# Patient Record
Sex: Female | Born: 1940 | Race: Black or African American | Hispanic: No | Smoking: Never smoker
Health system: Southern US, Community
[De-identification: ages and names within clinical notes are randomized; demographics above are authoritative.]

## PROBLEM LIST (undated history)

## (undated) DIAGNOSIS — R7303 Prediabetes: Secondary | ICD-10-CM

## (undated) DIAGNOSIS — R011 Cardiac murmur, unspecified: Secondary | ICD-10-CM

## (undated) DIAGNOSIS — M4802 Spinal stenosis, cervical region: Secondary | ICD-10-CM

## (undated) DIAGNOSIS — Z9223 Personal history of estrogen therapy: Secondary | ICD-10-CM

## (undated) DIAGNOSIS — D649 Anemia, unspecified: Secondary | ICD-10-CM

## (undated) DIAGNOSIS — M109 Gout, unspecified: Secondary | ICD-10-CM

## (undated) DIAGNOSIS — R4702 Dysphasia: Secondary | ICD-10-CM

## (undated) DIAGNOSIS — M069 Rheumatoid arthritis, unspecified: Secondary | ICD-10-CM

## (undated) DIAGNOSIS — M1990 Unspecified osteoarthritis, unspecified site: Secondary | ICD-10-CM

## (undated) DIAGNOSIS — R519 Headache, unspecified: Secondary | ICD-10-CM

## (undated) DIAGNOSIS — K589 Irritable bowel syndrome without diarrhea: Secondary | ICD-10-CM

## (undated) DIAGNOSIS — E7800 Pure hypercholesterolemia, unspecified: Secondary | ICD-10-CM

## (undated) DIAGNOSIS — K219 Gastro-esophageal reflux disease without esophagitis: Secondary | ICD-10-CM

## (undated) DIAGNOSIS — I10 Essential (primary) hypertension: Secondary | ICD-10-CM

## (undated) DIAGNOSIS — T790XXA Air embolism (traumatic), initial encounter: Secondary | ICD-10-CM

## (undated) DIAGNOSIS — L219 Seborrheic dermatitis, unspecified: Secondary | ICD-10-CM

## (undated) DIAGNOSIS — I340 Nonrheumatic mitral (valve) insufficiency: Secondary | ICD-10-CM

## (undated) HISTORY — DX: Spinal stenosis, cervical region: M48.02

## (undated) HISTORY — PX: BACK SURGERY: SHX140

## (undated) HISTORY — PX: CATARACT EXTRACTION W/ INTRAOCULAR LENS  IMPLANT, BILATERAL: SHX1307

## (undated) HISTORY — PX: TOE SURGERY: SHX1073

## (undated) HISTORY — DX: Essential (primary) hypertension: I10

## (undated) HISTORY — PX: EYE SURGERY: SHX253

## (undated) HISTORY — PX: TUBAL LIGATION: SHX77

## (undated) HISTORY — DX: Seborrheic dermatitis, unspecified: L21.9

## (undated) HISTORY — DX: Nonrheumatic mitral (valve) insufficiency: I34.0

## (undated) HISTORY — DX: Pure hypercholesterolemia, unspecified: E78.00

## (undated) HISTORY — DX: Gastro-esophageal reflux disease without esophagitis: K21.9

## (undated) HISTORY — DX: Personal history of estrogen therapy: Z92.23

## (undated) HISTORY — DX: Irritable bowel syndrome, unspecified: K58.9

## (undated) HISTORY — PX: COLONOSCOPY: SHX174

## (undated) HISTORY — DX: Dysphasia: R47.02

---

## 1998-07-30 ENCOUNTER — Other Ambulatory Visit: Admission: RE | Admit: 1998-07-30 | Discharge: 1998-07-30 | Payer: Self-pay | Admitting: Podiatrist

## 1998-12-09 ENCOUNTER — Emergency Department (HOSPITAL_COMMUNITY): Admission: EM | Admit: 1998-12-09 | Discharge: 1998-12-09 | Payer: Self-pay | Admitting: Emergency Medicine

## 1998-12-09 ENCOUNTER — Encounter: Payer: Self-pay | Admitting: Emergency Medicine

## 1998-12-14 ENCOUNTER — Encounter: Payer: Self-pay | Admitting: Internal Medicine

## 1998-12-14 ENCOUNTER — Inpatient Hospital Stay (HOSPITAL_COMMUNITY): Admission: EM | Admit: 1998-12-14 | Discharge: 1998-12-19 | Payer: Self-pay | Admitting: Internal Medicine

## 1998-12-15 ENCOUNTER — Encounter: Payer: Self-pay | Admitting: Internal Medicine

## 1998-12-17 ENCOUNTER — Encounter: Payer: Self-pay | Admitting: Internal Medicine

## 1999-03-26 ENCOUNTER — Other Ambulatory Visit: Admission: RE | Admit: 1999-03-26 | Discharge: 1999-03-26 | Payer: Self-pay | Admitting: Internal Medicine

## 1999-12-03 ENCOUNTER — Encounter: Admission: RE | Admit: 1999-12-03 | Discharge: 1999-12-03 | Payer: Self-pay | Admitting: Internal Medicine

## 1999-12-03 ENCOUNTER — Encounter: Payer: Self-pay | Admitting: Internal Medicine

## 2000-04-10 ENCOUNTER — Other Ambulatory Visit: Admission: RE | Admit: 2000-04-10 | Discharge: 2000-04-10 | Payer: Self-pay | Admitting: Internal Medicine

## 2001-04-12 ENCOUNTER — Other Ambulatory Visit: Admission: RE | Admit: 2001-04-12 | Discharge: 2001-04-12 | Payer: Self-pay | Admitting: Internal Medicine

## 2001-06-06 ENCOUNTER — Emergency Department (HOSPITAL_COMMUNITY): Admission: EM | Admit: 2001-06-06 | Discharge: 2001-06-06 | Payer: Self-pay

## 2002-05-02 ENCOUNTER — Other Ambulatory Visit: Admission: RE | Admit: 2002-05-02 | Discharge: 2002-05-02 | Payer: Self-pay | Admitting: Internal Medicine

## 2002-12-20 ENCOUNTER — Ambulatory Visit (HOSPITAL_COMMUNITY): Admission: RE | Admit: 2002-12-20 | Discharge: 2002-12-20 | Payer: Self-pay | Admitting: Gastroenterology

## 2003-06-27 ENCOUNTER — Other Ambulatory Visit: Admission: RE | Admit: 2003-06-27 | Discharge: 2003-06-27 | Payer: Self-pay | Admitting: Internal Medicine

## 2004-07-12 ENCOUNTER — Other Ambulatory Visit: Admission: RE | Admit: 2004-07-12 | Discharge: 2004-07-12 | Payer: Self-pay | Admitting: Internal Medicine

## 2005-05-21 ENCOUNTER — Emergency Department (HOSPITAL_COMMUNITY): Admission: EM | Admit: 2005-05-21 | Discharge: 2005-05-21 | Payer: Self-pay | Admitting: Family Medicine

## 2005-07-22 ENCOUNTER — Other Ambulatory Visit: Admission: RE | Admit: 2005-07-22 | Discharge: 2005-07-22 | Payer: Self-pay | Admitting: Internal Medicine

## 2005-09-04 ENCOUNTER — Emergency Department (HOSPITAL_COMMUNITY): Admission: EM | Admit: 2005-09-04 | Discharge: 2005-09-04 | Payer: Self-pay | Admitting: Family Medicine

## 2006-01-12 ENCOUNTER — Emergency Department (HOSPITAL_COMMUNITY): Admission: EM | Admit: 2006-01-12 | Discharge: 2006-01-12 | Payer: Self-pay | Admitting: Emergency Medicine

## 2006-07-24 ENCOUNTER — Other Ambulatory Visit: Admission: RE | Admit: 2006-07-24 | Discharge: 2006-07-24 | Payer: Self-pay | Admitting: Internal Medicine

## 2007-05-06 ENCOUNTER — Emergency Department (HOSPITAL_COMMUNITY): Admission: EM | Admit: 2007-05-06 | Discharge: 2007-05-06 | Payer: Self-pay | Admitting: Emergency Medicine

## 2007-05-09 ENCOUNTER — Emergency Department (HOSPITAL_COMMUNITY): Admission: EM | Admit: 2007-05-09 | Discharge: 2007-05-09 | Payer: Self-pay | Admitting: Family Medicine

## 2007-07-27 ENCOUNTER — Other Ambulatory Visit: Admission: RE | Admit: 2007-07-27 | Discharge: 2007-07-27 | Payer: Self-pay | Admitting: Internal Medicine

## 2008-08-10 ENCOUNTER — Ambulatory Visit: Payer: Self-pay | Admitting: Internal Medicine

## 2008-12-28 ENCOUNTER — Emergency Department (HOSPITAL_COMMUNITY): Admission: EM | Admit: 2008-12-28 | Discharge: 2008-12-28 | Payer: Self-pay | Admitting: Family Medicine

## 2009-02-09 ENCOUNTER — Ambulatory Visit: Payer: Self-pay | Admitting: Internal Medicine

## 2009-03-09 ENCOUNTER — Ambulatory Visit: Payer: Self-pay | Admitting: Internal Medicine

## 2009-11-05 ENCOUNTER — Ambulatory Visit: Payer: Self-pay | Admitting: Internal Medicine

## 2009-11-09 ENCOUNTER — Ambulatory Visit: Payer: Self-pay | Admitting: Internal Medicine

## 2010-02-08 ENCOUNTER — Encounter: Admission: RE | Admit: 2010-02-08 | Discharge: 2010-02-08 | Payer: Self-pay | Admitting: Sports Medicine

## 2010-03-01 ENCOUNTER — Encounter: Admission: RE | Admit: 2010-03-01 | Discharge: 2010-03-01 | Payer: Self-pay | Admitting: Sports Medicine

## 2010-05-06 ENCOUNTER — Ambulatory Visit: Payer: Self-pay | Admitting: Internal Medicine

## 2010-05-26 HISTORY — PX: LUMBAR FUSION: SHX111

## 2010-05-30 ENCOUNTER — Inpatient Hospital Stay (HOSPITAL_COMMUNITY)
Admission: RE | Admit: 2010-05-30 | Discharge: 2010-06-06 | Payer: Self-pay | Source: Home / Self Care | Attending: Neurological Surgery | Admitting: Neurological Surgery

## 2010-06-10 LAB — CBC
HCT: 22 % — ABNORMAL LOW (ref 36.0–46.0)
HCT: 30.2 % — ABNORMAL LOW (ref 36.0–46.0)
HCT: 29.5 % — ABNORMAL LOW (ref 36.0–46.0)
HCT: 34.8 % — ABNORMAL LOW (ref 36.0–46.0)
Hemoglobin: 7.2 g/dL — ABNORMAL LOW (ref 12.0–15.0)
Hemoglobin: 10.5 g/dL — ABNORMAL LOW (ref 12.0–15.0)
Hemoglobin: 10.1 g/dL — ABNORMAL LOW (ref 12.0–15.0)
Hemoglobin: 11.4 g/dL — ABNORMAL LOW (ref 12.0–15.0)
MCH: 31.3 pg (ref 26.0–34.0)
MCH: 32.1 pg (ref 26.0–34.0)
MCH: 31.7 pg (ref 26.0–34.0)
MCH: 30.5 pg (ref 26.0–34.0)
MCHC: 32.7 g/dL (ref 30.0–36.0)
MCHC: 34.8 g/dL (ref 30.0–36.0)
MCHC: 34.2 g/dL (ref 30.0–36.0)
MCHC: 32.8 g/dL (ref 30.0–36.0)
MCV: 95.7 fL (ref 78.0–100.0)
MCV: 92.4 fL (ref 78.0–100.0)
MCV: 92.5 fL (ref 78.0–100.0)
MCV: 93 fL (ref 78.0–100.0)
Platelets: 353 10*3/uL (ref 150–400)
Platelets: 376 10*3/uL (ref 150–400)
Platelets: 352 10*3/uL (ref 150–400)
Platelets: 354 10*3/uL (ref 150–400)
RBC: 2.3 MIL/uL — ABNORMAL LOW (ref 3.87–5.11)
RBC: 3.27 MIL/uL — ABNORMAL LOW (ref 3.87–5.11)
RBC: 3.19 MIL/uL — ABNORMAL LOW (ref 3.87–5.11)
RBC: 3.74 MIL/uL — ABNORMAL LOW (ref 3.87–5.11)
RDW: 13.7 % (ref 11.5–15.5)
RDW: 14 % (ref 11.5–15.5)
RDW: 14.4 % (ref 11.5–15.5)
RDW: 14 % (ref 11.5–15.5)
WBC: 10.2 10*3/uL (ref 4.0–10.5)
WBC: 9.7 10*3/uL (ref 4.0–10.5)
WBC: 9.2 10*3/uL (ref 4.0–10.5)
WBC: 8.4 10*3/uL (ref 4.0–10.5)

## 2010-06-10 LAB — BASIC METABOLIC PANEL
BUN: 26 mg/dL — ABNORMAL HIGH (ref 6–23)
BUN: 18 mg/dL (ref 6–23)
BUN: 14 mg/dL (ref 6–23)
CO2: 22 mEq/L (ref 19–32)
CO2: 24 mEq/L (ref 19–32)
CO2: 25 mEq/L (ref 19–32)
Calcium: 7.8 mg/dL — ABNORMAL LOW (ref 8.4–10.5)
Calcium: 8.3 mg/dL — ABNORMAL LOW (ref 8.4–10.5)
Calcium: 8.5 mg/dL (ref 8.4–10.5)
Chloride: 104 mEq/L (ref 96–112)
Chloride: 101 mEq/L (ref 96–112)
Chloride: 98 mEq/L (ref 96–112)
Creatinine, Ser: 1.81 mg/dL — ABNORMAL HIGH (ref 0.4–1.2)
Creatinine, Ser: 1.35 mg/dL — ABNORMAL HIGH (ref 0.4–1.2)
Creatinine, Ser: 1.13 mg/dL (ref 0.4–1.2)
GFR calc Af Amer: 33 mL/min — ABNORMAL LOW (ref >60)
GFR calc Af Amer: 47 mL/min — ABNORMAL LOW (ref >60)
GFR calc Af Amer: 57 mL/min — ABNORMAL LOW (ref >60)
GFR calc non Af Amer: 28 mL/min — ABNORMAL LOW (ref >60)
GFR calc non Af Amer: 39 mL/min — ABNORMAL LOW (ref >60)
GFR calc non Af Amer: 47 mL/min — ABNORMAL LOW (ref >60)
Glucose, Bld: 111 mg/dL — ABNORMAL HIGH (ref 70–99)
Glucose, Bld: 124 mg/dL — ABNORMAL HIGH (ref 70–99)
Glucose, Bld: 106 mg/dL — ABNORMAL HIGH (ref 70–99)
Potassium: 4.1 mEq/L (ref 3.5–5.1)
Potassium: 3.8 mEq/L (ref 3.5–5.1)
Potassium: 3.7 mEq/L (ref 3.5–5.1)
Sodium: 133 mEq/L — ABNORMAL LOW (ref 135–145)
Sodium: 134 mEq/L — ABNORMAL LOW (ref 135–145)
Sodium: 132 mEq/L — ABNORMAL LOW (ref 135–145)

## 2010-06-10 LAB — URINALYSIS, ROUTINE W REFLEX MICROSCOPIC
Bilirubin Urine: NEGATIVE
Hgb urine dipstick: NEGATIVE
Ketones, ur: NEGATIVE mg/dL
Nitrite: NEGATIVE
Protein, ur: NEGATIVE mg/dL
Specific Gravity, Urine: 1.014 (ref 1.005–1.030)
Urine Glucose, Fasting: NEGATIVE mg/dL
Urobilinogen, UA: 0.2 mg/dL (ref 0.0–1.0)
pH: 6 (ref 5.0–8.0)

## 2010-06-10 LAB — CROSSMATCH
ABO/RH(D): A POS
Antibody Screen: NEGATIVE
Unit division: 0
Unit division: 0

## 2010-06-10 LAB — CARDIAC PANEL(CRET KIN+CKTOT+MB+TROPI)
CK, MB: 0.9 ng/mL (ref 0.3–4.0)
Relative Index: 0.5 (ref 0.0–2.5)
Total CK: 193 U/L — ABNORMAL HIGH (ref 7–177)
Troponin I: 0.01 ng/mL (ref 0.00–0.06)

## 2010-06-14 ENCOUNTER — Inpatient Hospital Stay (HOSPITAL_COMMUNITY)
Admission: EM | Admit: 2010-06-14 | Discharge: 2010-06-19 | Payer: Self-pay | Source: Home / Self Care | Attending: Internal Medicine | Admitting: Internal Medicine

## 2010-06-17 LAB — CBC
HCT: 36.2 % (ref 36.0–46.0)
Hemoglobin: 12 g/dL (ref 12.0–15.0)
MCH: 30.1 pg (ref 26.0–34.0)
MCHC: 33.1 g/dL (ref 30.0–36.0)
MCV: 90.7 fL (ref 78.0–100.0)
Platelets: 281 10*3/uL (ref 150–400)
RBC: 3.99 MIL/uL (ref 3.87–5.11)
RDW: 13.1 % (ref 11.5–15.5)
WBC: 13.6 10*3/uL — ABNORMAL HIGH (ref 4.0–10.5)

## 2010-06-17 LAB — CK TOTAL AND CKMB (NOT AT ARMC)
CK, MB: 0.8 ng/mL (ref 0.3–4.0)
Relative Index: INVALID (ref 0.0–2.5)
Total CK: 26 U/L (ref 7–177)

## 2010-06-17 LAB — CULTURE, BLOOD (ROUTINE X 2)
Culture  Setup Time: 201201121035
Culture  Setup Time: 201201121356
Culture: NO GROWTH
Culture: NO GROWTH

## 2010-06-17 LAB — COMPREHENSIVE METABOLIC PANEL
ALT: 19 U/L (ref 0–35)
AST: 22 U/L (ref 0–37)
Albumin: 3.2 g/dL — ABNORMAL LOW (ref 3.5–5.2)
Alkaline Phosphatase: 118 U/L — ABNORMAL HIGH (ref 39–117)
BUN: 25 mg/dL — ABNORMAL HIGH (ref 6–23)
CO2: 19 mEq/L (ref 19–32)
Calcium: 9.4 mg/dL (ref 8.4–10.5)
Chloride: 96 mEq/L (ref 96–112)
Creatinine, Ser: 1.4 mg/dL — ABNORMAL HIGH (ref 0.4–1.2)
GFR calc Af Amer: 45 mL/min — ABNORMAL LOW (ref >60)
GFR calc non Af Amer: 37 mL/min — ABNORMAL LOW (ref >60)
Glucose, Bld: 92 mg/dL (ref 70–99)
Potassium: 3.9 mEq/L (ref 3.5–5.1)
Sodium: 132 mEq/L — ABNORMAL LOW (ref 135–145)
Total Bilirubin: 0.6 mg/dL (ref 0.3–1.2)
Total Protein: 7.6 g/dL (ref 6.0–8.3)

## 2010-06-17 LAB — DIFFERENTIAL
Basophils Absolute: 0 10*3/uL (ref 0.0–0.1)
Basophils Relative: 0 % (ref 0–1)
Eosinophils Absolute: 0.1 10*3/uL (ref 0.0–0.7)
Eosinophils Relative: 1 % (ref 0–5)
Lymphocytes Relative: 9 % — ABNORMAL LOW (ref 12–46)
Lymphs Abs: 1.3 10*3/uL (ref 0.7–4.0)
Monocytes Absolute: 1.2 10*3/uL — ABNORMAL HIGH (ref 0.1–1.0)
Monocytes Relative: 9 % (ref 3–12)
Neutro Abs: 11 10*3/uL — ABNORMAL HIGH (ref 1.7–7.7)
Neutrophils Relative %: 81 % — ABNORMAL HIGH (ref 43–77)

## 2010-06-17 LAB — POCT I-STAT, CHEM 8
BUN: 26 mg/dL — ABNORMAL HIGH (ref 6–23)
Calcium, Ion: 1.12 mmol/L (ref 1.12–1.32)
Chloride: 100 mEq/L (ref 96–112)
Creatinine, Ser: 1.3 mg/dL — ABNORMAL HIGH (ref 0.4–1.2)
Glucose, Bld: 92 mg/dL (ref 70–99)
HCT: 41 % (ref 36.0–46.0)
Hemoglobin: 13.9 g/dL (ref 12.0–15.0)
Potassium: 3.8 mEq/L (ref 3.5–5.1)
Sodium: 130 mEq/L — ABNORMAL LOW (ref 135–145)
TCO2: 22 mmol/L (ref 0–100)

## 2010-06-17 LAB — POCT CARDIAC MARKERS
CKMB, poc: <1 ng/mL — ABNORMAL LOW (ref 1.0–8.0)
Myoglobin, poc: 164 ng/mL (ref 12–200)
Troponin i, poc: <0.05 ng/mL (ref 0.00–0.09)

## 2010-06-17 LAB — APTT: aPTT: 20 seconds — ABNORMAL LOW (ref 24–37)

## 2010-06-17 LAB — D-DIMER, QUANTITATIVE: D-Dimer, Quant: 10.39 ug/mL-FEU — ABNORMAL HIGH (ref 0.00–0.48)

## 2010-06-17 LAB — PROTIME-INR
INR: 1.02 (ref 0.00–1.49)
Prothrombin Time: 13.6 seconds (ref 11.6–15.2)

## 2010-06-17 LAB — TROPONIN I: Troponin I: 0.01 ng/mL (ref 0.00–0.06)

## 2010-06-18 LAB — MRSA PCR SCREENING: MRSA by PCR: NEGATIVE

## 2010-06-18 LAB — CBC
HCT: 31.2 % — ABNORMAL LOW (ref 36.0–46.0)
HCT: 30 % — ABNORMAL LOW (ref 36.0–46.0)
HCT: 28.6 % — ABNORMAL LOW (ref 36.0–46.0)
Hemoglobin: 10.6 g/dL — ABNORMAL LOW (ref 12.0–15.0)
Hemoglobin: 10.1 g/dL — ABNORMAL LOW (ref 12.0–15.0)
Hemoglobin: 9.7 g/dL — ABNORMAL LOW (ref 12.0–15.0)
MCH: 30.8 pg (ref 26.0–34.0)
MCH: 30.5 pg (ref 26.0–34.0)
MCH: 30.8 pg (ref 26.0–34.0)
MCHC: 34 g/dL (ref 30.0–36.0)
MCHC: 33.7 g/dL (ref 30.0–36.0)
MCHC: 33.9 g/dL (ref 30.0–36.0)
MCV: 90.7 fL (ref 78.0–100.0)
MCV: 90.6 fL (ref 78.0–100.0)
MCV: 90.8 fL (ref 78.0–100.0)
Platelets: 325 10*3/uL (ref 150–400)
Platelets: 319 10*3/uL (ref 150–400)
Platelets: 302 10*3/uL (ref 150–400)
RBC: 3.44 MIL/uL — ABNORMAL LOW (ref 3.87–5.11)
RBC: 3.31 MIL/uL — ABNORMAL LOW (ref 3.87–5.11)
RBC: 3.15 MIL/uL — ABNORMAL LOW (ref 3.87–5.11)
RDW: 13.3 % (ref 11.5–15.5)
RDW: 13.2 % (ref 11.5–15.5)
RDW: 13.2 % (ref 11.5–15.5)
WBC: 13.8 10*3/uL — ABNORMAL HIGH (ref 4.0–10.5)
WBC: 10.6 10*3/uL — ABNORMAL HIGH (ref 4.0–10.5)
WBC: 8.9 10*3/uL (ref 4.0–10.5)

## 2010-06-18 LAB — BASIC METABOLIC PANEL
BUN: 22 mg/dL (ref 6–23)
BUN: 18 mg/dL (ref 6–23)
CO2: 11 mEq/L — ABNORMAL LOW (ref 19–32)
CO2: 21 mEq/L (ref 19–32)
Calcium: UNDETERMINED mg/dL (ref 8.4–10.5)
Calcium: 8.6 mg/dL (ref 8.4–10.5)
Chloride: 101 mEq/L (ref 96–112)
Chloride: 102 mEq/L (ref 96–112)
Creatinine, Ser: 1.04 mg/dL (ref 0.4–1.2)
Creatinine, Ser: 1.04 mg/dL (ref 0.4–1.2)
GFR calc Af Amer: >60 mL/min (ref >60)
GFR calc Af Amer: >60 mL/min (ref >60)
GFR calc non Af Amer: 52 mL/min — ABNORMAL LOW (ref >60)
GFR calc non Af Amer: 52 mL/min — ABNORMAL LOW (ref >60)
Glucose, Bld: 97 mg/dL (ref 70–99)
Glucose, Bld: 97 mg/dL (ref 70–99)
Potassium: UNDETERMINED mEq/L (ref 3.5–5.1)
Potassium: 3.2 mEq/L — ABNORMAL LOW (ref 3.5–5.1)
Sodium: 134 mEq/L — ABNORMAL LOW (ref 135–145)
Sodium: 132 mEq/L — ABNORMAL LOW (ref 135–145)

## 2010-06-18 LAB — CARDIAC PANEL(CRET KIN+CKTOT+MB+TROPI)
CK, MB: 1 ng/mL (ref 0.3–4.0)
CK, MB: 0.8 ng/mL (ref 0.3–4.0)
Relative Index: INVALID (ref 0.0–2.5)
Relative Index: INVALID (ref 0.0–2.5)
Total CK: 45 U/L (ref 7–177)
Total CK: 40 U/L (ref 7–177)
Troponin I: 0.03 ng/mL (ref 0.00–0.06)
Troponin I: 0.01 ng/mL (ref 0.00–0.06)

## 2010-06-18 LAB — GLUCOSE, CAPILLARY: Glucose-Capillary: 101 mg/dL — ABNORMAL HIGH (ref 70–99)

## 2010-06-18 LAB — PROTIME-INR
INR: 1.13 (ref 0.00–1.49)
INR: 1.11 (ref 0.00–1.49)
Prothrombin Time: 14.7 seconds (ref 11.6–15.2)
Prothrombin Time: 14.5 seconds (ref 11.6–15.2)

## 2010-06-18 LAB — IRON AND TIBC
Iron: 32 ug/dL — ABNORMAL LOW (ref 42–135)
Saturation Ratios: 16 % — ABNORMAL LOW (ref 20–55)
TIBC: 198 ug/dL — ABNORMAL LOW (ref 250–470)
UIBC: 166 ug/dL

## 2010-06-18 LAB — HEPARIN LEVEL (UNFRACTIONATED)
Heparin Unfractionated: 0.19 IU/mL — ABNORMAL LOW (ref 0.30–0.70)
Heparin Unfractionated: 0.23 IU/mL — ABNORMAL LOW (ref 0.30–0.70)
Heparin Unfractionated: 0.38 IU/mL (ref 0.30–0.70)

## 2010-06-18 LAB — FOLATE: Folate: 10.3 ng/mL

## 2010-06-18 LAB — BRAIN NATRIURETIC PEPTIDE: Pro B Natriuretic peptide (BNP): <30 pg/mL (ref 0.0–100.0)

## 2010-06-18 LAB — FERRITIN: Ferritin: 380 ng/mL — ABNORMAL HIGH (ref 10–291)

## 2010-06-18 LAB — VITAMIN B12: Vitamin B-12: 386 pg/mL (ref 211–911)

## 2010-06-19 LAB — CBC
HCT: 30.5 % — ABNORMAL LOW (ref 36.0–46.0)
HCT: 30.7 % — ABNORMAL LOW (ref 36.0–46.0)
Hemoglobin: 10.3 g/dL — ABNORMAL LOW (ref 12.0–15.0)
Hemoglobin: 10.3 g/dL — ABNORMAL LOW (ref 12.0–15.0)
MCH: 30.5 pg (ref 26.0–34.0)
MCH: 30.5 pg (ref 26.0–34.0)
MCHC: 33.8 g/dL (ref 30.0–36.0)
MCHC: 33.6 g/dL (ref 30.0–36.0)
MCV: 90.2 fL (ref 78.0–100.0)
MCV: 90.8 fL (ref 78.0–100.0)
Platelets: 291 10*3/uL (ref 150–400)
Platelets: 298 10*3/uL (ref 150–400)
RBC: 3.38 MIL/uL — ABNORMAL LOW (ref 3.87–5.11)
RBC: 3.38 MIL/uL — ABNORMAL LOW (ref 3.87–5.11)
RDW: 13.3 % (ref 11.5–15.5)
RDW: 13.4 % (ref 11.5–15.5)
WBC: 7.3 10*3/uL (ref 4.0–10.5)
WBC: 7.1 10*3/uL (ref 4.0–10.5)

## 2010-06-19 LAB — BASIC METABOLIC PANEL
BUN: 13 mg/dL (ref 6–23)
BUN: 12 mg/dL (ref 6–23)
CO2: 23 mEq/L (ref 19–32)
CO2: 24 mEq/L (ref 19–32)
Calcium: 8.8 mg/dL (ref 8.4–10.5)
Calcium: 8.9 mg/dL (ref 8.4–10.5)
Chloride: 105 mEq/L (ref 96–112)
Chloride: 104 mEq/L (ref 96–112)
Creatinine, Ser: 0.92 mg/dL (ref 0.4–1.2)
Creatinine, Ser: 0.89 mg/dL (ref 0.4–1.2)
GFR calc Af Amer: >60 mL/min (ref >60)
GFR calc Af Amer: >60 mL/min (ref >60)
GFR calc non Af Amer: >60 mL/min (ref >60)
GFR calc non Af Amer: >60 mL/min (ref >60)
Glucose, Bld: 93 mg/dL (ref 70–99)
Glucose, Bld: 93 mg/dL (ref 70–99)
Potassium: 3.7 mEq/L (ref 3.5–5.1)
Potassium: 3.7 mEq/L (ref 3.5–5.1)
Sodium: 135 mEq/L (ref 135–145)
Sodium: 135 mEq/L (ref 135–145)

## 2010-06-19 LAB — PROTIME-INR
INR: 1.29 (ref 0.00–1.49)
INR: 1.62 — ABNORMAL HIGH (ref 0.00–1.49)
Prothrombin Time: 16.3 seconds — ABNORMAL HIGH (ref 11.6–15.2)
Prothrombin Time: 19.4 seconds — ABNORMAL HIGH (ref 11.6–15.2)

## 2010-06-21 ENCOUNTER — Ambulatory Visit
Admission: RE | Admit: 2010-06-21 | Discharge: 2010-06-21 | Payer: Self-pay | Source: Home / Self Care | Attending: Internal Medicine | Admitting: Internal Medicine

## 2010-06-21 NOTE — Discharge Summary (Addendum)
  [name], [name]               ACCOUNT NO.:  [id number]  MEDICAL RECORD NO.:  [id number]          PATIENT TYPE:  INP  LOCATION:  3017                         FACILITY:  [facility]  PHYSICIAN:  Tia Alert, MD     DATE OF BIRTH:  1940/06/14  DATE OF ADMISSION:  05/30/2010 DATE OF DISCHARGE:  06/06/2010                              DISCHARGE SUMMARY   ADMITTING DIAGNOSIS:  Spinal stenosis with spondylolisthesis.  PROCEDURES:  Posterior lumbar interbody fusion L4-5, L5-S1.  SECONDARY DIAGNOSES: 1. Acute blood loss anemia. 2. Acute renal insufficiency.  BRIEF HISTORY OF PRESENT ILLNESS:  [name] is a very pleasant 70 year old female who presented with severe back bilateral leg pain.  She had an MRI and plain films which showed severe spondylosis with stenosis at L4-5, L5-S1 with spondylolisthesis.  I recommended two-level decompression instrumented fusion.  She understood the risks, benefits, expected outcome and wished to proceed.  HOSPITAL COURSE:  The patient was admitted on May 30, 2010, taken to the operating room where she underwent a two-level lumbar fusion.  The patient tolerated the procedure well and was taken to the recovery room and then to the floor in stable condition.  For details of the operative procedure, please see the dictated operative note.  During her hospital stay, her wound remained clean, dry, and intact.  She tolerated regular diet.  She had appropriate postoperative back soreness.  She had some intermittent right leg pain which was likely from nerve root retraction during the surgery or nerve root manipulation during the surgery.  She had no weakness.  She had good strength.  She had worked with Physical and Occupational Therapy and did well with that.  She did have acute blood loss anemia with hemoglobin of 7.2.  She received 2 units of packed red blood cells which increased her hemoglobin to 10.6.  At discharge, her hemoglobin was 11.4 and  was increasing.  She also had acute renal insufficiency and was hydrated.  Her creatinine got as high as 1.8; but at discharge, it was 1.137, so it improved quite a bit.  She was ambulating fairly well with physical and occupational therapy and using her walker.  Her wound was clean, dry, and intact at time of discharge.  She did have once fever spike up to 101.3.  Chest x-ray showed only low lung volumes.  She was given an incentive spirometer. She was afebrile at the time of discharge.  She had one episode of some hypotension and was found to be anemic and received blood and had no further episodes of hypotension.  She was discharged home in stable condition on June 06, 2010, with plans to follow up in 1 week.  FINAL DIAGNOSIS:  Posterior lumbar interbody fusion L4-5, L5-S1.     Tia Alert, MD     DSJ/MEDQ  D:  06/06/2010  T:  06/06/2010  Job:  [id number]  Electronically Signed by Marikay Alar MD on 06/21/2010 08:40:43 AM

## 2010-06-25 ENCOUNTER — Ambulatory Visit
Admission: RE | Admit: 2010-06-25 | Discharge: 2010-06-25 | Payer: Self-pay | Source: Home / Self Care | Attending: Internal Medicine | Admitting: Internal Medicine

## 2010-06-29 NOTE — Discharge Summary (Signed)
[name], [name]               ACCOUNT NO.:  [id number]  MEDICAL RECORD NO.:  [id number]          PATIENT TYPE:  INP  LOCATION:  3701                         FACILITY:  [facility]  PHYSICIAN:  Kathlen Mody, MD       DATE OF BIRTH:  1940-10-19  DATE OF ADMISSION:  06/14/2010 DATE OF DISCHARGE:  06/19/2010                              DISCHARGE SUMMARY   PRIMARY CARE PHYSICIAN:  Tanya Cole. Lenord Fellers, MD  DISCHARGE DIAGNOSES:  Bilateral pulmonary embolism.  OTHER DIAGNOSES:  Anemia, renal insufficiency, hypertension, cervical spondylosis status post recent posterior lumbar interbody fusion, glaucoma, hyperlipidemia, irritable bowel syndrome, osteoarthritis.  DISCHARGE MEDICATIONS: 1. Lovenox 90 mg subcu q.12 for 5 days. 2. Coumadin 7.5 mg daily for about 1 week and to check INR on Friday     and dose Coumadin as per the INR, Vicodin 1-2 tablets p.o. q.6 h.     for 7 days. 3. Lisinopril 5 mg p.o. daily. 4. Simvastatin 20 mg p.o. daily. 5. Metaxalone 800 mg p.o. 3 times a day. 6. Probiotic colon care counter 1 cap p.o. daily. 7. Librium 10 mg 1 tablet p.o. t.i.d. 8. Triamterene/hydrochlorothiazide 75/50 mg 1 tablet daily.  PERTINENT LABS:  She had a CBC done on January 20 which showed WBC count of 13.6, hemoglobin of 12, hematocrit of 36.2, platelets of 281,000. Point-of-care cardiac markers negative.  Sodium of 130, potassium of 3.8, chloride of 105, bicarb of 22, glucose of 92, creatinine of 1.3, BUN of 23.  D-dimers were elevated with 10.39.  Next set of troponin and CK-MB were also negative.  INR was 1.02, BNP was less than 30.  MRSA screen was negative.  Next set of cardiac enzymes were negative.  RADIOLOGY:  She had a chest x-ray done on January 20 which showed no significant abnormalities, no infiltrate.  CT angiogram was done on the same day which showed positive for bilateral pulmonary embolism, right heart strain cannot be excluded, small right trace left pleural effusion,  and bibasilar atelectasis.  BRIEF HOSPITAL COURSE:  This is a 70 year old lady with past medical history of recent posterior lumbar interbody fusion, hypertension, hyperlipidemia, osteoarthritis came in complaining of shortness of breath and chest discomfort.  She was worked up for acute coronary syndrome as well as PE. She was found to have elevated D-dimer and further underwent CT angiogram of the chest and found to have bilateral PE. She was concomitantly started on Lovenox and Coumadin with a recent INR of 1.62.  The patient at this time is being discharged home with 5 days of Lovenox and Coumadin of 7.5 mg.  She is recommended to get an INR checkup on Friday in Dr. Beryle Quant and dosed Coumadin as per INR.  DISCHARGE PHYSICAL EXAMINATION:  VITAL SIGNS:  She had a temperature of 98.4, pulse of 78, blood pressure of 111/72, respirations 18, oxygen saturation 98% on room air. GENERAL:  The patient is alert, afebrile, oriented x3, comfortable sitting in a chair. CARDIOVASCULAR:  S1, S2 heard.  No rubs, murmurs, or gallops.  Regular rate and rhythm. RESPIRATORY:  Good air entry bilateral.  No adventitious sounds. ABDOMEN:  Soft, nontender, nondistended.  Good bowel sounds. EXTREMITIES:  No pedal edema. NEUROLOGICAL:  Nonfocal.  At this time the patient is hemodynamically stable to be discharged home to follow up on Friday at Dr. Beryle Quant office to check her INR and dose Coumadin as per the INR.  Time spent with the patient 25 minutes.          ______________________________ Kathlen Mody, MD     VA/MEDQ  D:  06/19/2010  T:  06/20/2010  Job:  [id number]  Electronically Signed by Kathlen Mody MD on 06/29/2010 11:20:54 PM

## 2010-07-01 ENCOUNTER — Other Ambulatory Visit (INDEPENDENT_AMBULATORY_CARE_PROVIDER_SITE_OTHER): Payer: MEDICARE | Admitting: Internal Medicine

## 2010-07-01 DIAGNOSIS — M545 Low back pain, unspecified: Secondary | ICD-10-CM

## 2010-07-01 DIAGNOSIS — Z7901 Long term (current) use of anticoagulants: Secondary | ICD-10-CM

## 2010-07-05 ENCOUNTER — Inpatient Hospital Stay (HOSPITAL_COMMUNITY)
Admission: EM | Admit: 2010-07-05 | Discharge: 2010-07-08 | DRG: 866 | Disposition: A | Discharge status: Home / Self Care | Payer: MEDICARE | Attending: Internal Medicine | Admitting: Internal Medicine

## 2010-07-05 ENCOUNTER — Emergency Department (HOSPITAL_COMMUNITY): Payer: MEDICARE

## 2010-07-05 ENCOUNTER — Inpatient Hospital Stay (HOSPITAL_COMMUNITY): Payer: MEDICARE

## 2010-07-05 DIAGNOSIS — Z7901 Long term (current) use of anticoagulants: Secondary | ICD-10-CM

## 2010-07-05 DIAGNOSIS — R197 Diarrhea, unspecified: Secondary | ICD-10-CM | POA: Diagnosis present

## 2010-07-05 DIAGNOSIS — H409 Unspecified glaucoma: Secondary | ICD-10-CM | POA: Diagnosis present

## 2010-07-05 DIAGNOSIS — Z9889 Other specified postprocedural states: Secondary | ICD-10-CM

## 2010-07-05 DIAGNOSIS — I10 Essential (primary) hypertension: Secondary | ICD-10-CM | POA: Diagnosis present

## 2010-07-05 DIAGNOSIS — E860 Dehydration: Secondary | ICD-10-CM | POA: Diagnosis present

## 2010-07-05 DIAGNOSIS — B9789 Other viral agents as the cause of diseases classified elsewhere: Principal | ICD-10-CM | POA: Diagnosis present

## 2010-07-05 DIAGNOSIS — I2782 Chronic pulmonary embolism: Secondary | ICD-10-CM | POA: Diagnosis present

## 2010-07-05 DIAGNOSIS — K589 Irritable bowel syndrome without diarrhea: Secondary | ICD-10-CM | POA: Diagnosis present

## 2010-07-05 DIAGNOSIS — E785 Hyperlipidemia, unspecified: Secondary | ICD-10-CM | POA: Diagnosis present

## 2010-07-05 DIAGNOSIS — R112 Nausea with vomiting, unspecified: Secondary | ICD-10-CM | POA: Diagnosis present

## 2010-07-05 LAB — URINALYSIS, ROUTINE W REFLEX MICROSCOPIC
Bilirubin Urine: NEGATIVE
Bilirubin Urine: NEGATIVE
Hgb urine dipstick: NEGATIVE
Hgb urine dipstick: NEGATIVE
Ketones, ur: NEGATIVE mg/dL
Ketones, ur: NEGATIVE mg/dL
Nitrite: NEGATIVE
Nitrite: NEGATIVE
Protein, ur: NEGATIVE mg/dL
Protein, ur: NEGATIVE mg/dL
Specific Gravity, Urine: 1.006 (ref 1.005–1.030)
Specific Gravity, Urine: 1.007 (ref 1.005–1.030)
Urine Glucose, Fasting: NEGATIVE mg/dL
Urine Glucose, Fasting: NEGATIVE mg/dL
Urobilinogen, UA: 0.2 mg/dL (ref 0.0–1.0)
Urobilinogen, UA: 0.2 mg/dL (ref 0.0–1.0)
pH: 7 (ref 5.0–8.0)
pH: 6 (ref 5.0–8.0)

## 2010-07-05 LAB — COMPREHENSIVE METABOLIC PANEL
ALT: 16 U/L (ref 0–35)
AST: 21 U/L (ref 0–37)
Albumin: 3.9 g/dL (ref 3.5–5.2)
Alkaline Phosphatase: 110 U/L (ref 39–117)
BUN: 18 mg/dL (ref 6–23)
CO2: 25 mEq/L (ref 19–32)
Calcium: 9.7 mg/dL (ref 8.4–10.5)
Chloride: 99 mEq/L (ref 96–112)
Creatinine, Ser: 1.22 mg/dL — ABNORMAL HIGH (ref 0.4–1.2)
GFR calc Af Amer: 53 mL/min — ABNORMAL LOW (ref >60)
GFR calc non Af Amer: 43 mL/min — ABNORMAL LOW (ref >60)
Glucose, Bld: 106 mg/dL — ABNORMAL HIGH (ref 70–99)
Potassium: 4.1 mEq/L (ref 3.5–5.1)
Sodium: 136 mEq/L (ref 135–145)
Total Bilirubin: 0.6 mg/dL (ref 0.3–1.2)
Total Protein: 7.7 g/dL (ref 6.0–8.3)

## 2010-07-05 LAB — LACTIC ACID, PLASMA: Lactic Acid, Venous: 1 mmol/L (ref 0.5–2.2)

## 2010-07-05 LAB — DIFFERENTIAL
Basophils Absolute: 0 10*3/uL (ref 0.0–0.1)
Basophils Relative: 0 % (ref 0–1)
Eosinophils Absolute: 0.1 10*3/uL (ref 0.0–0.7)
Eosinophils Relative: 2 % (ref 0–5)
Lymphocytes Relative: 28 % (ref 12–46)
Lymphs Abs: 1.9 10*3/uL (ref 0.7–4.0)
Monocytes Absolute: 0.7 10*3/uL (ref 0.1–1.0)
Monocytes Relative: 10 % (ref 3–12)
Neutro Abs: 4.1 10*3/uL (ref 1.7–7.7)
Neutrophils Relative %: 61 % (ref 43–77)

## 2010-07-05 LAB — CBC
HCT: 36.2 % (ref 36.0–46.0)
Hemoglobin: 12.4 g/dL (ref 12.0–15.0)
MCH: 31.2 pg (ref 26.0–34.0)
MCHC: 34.3 g/dL (ref 30.0–36.0)
MCV: 91 fL (ref 78.0–100.0)
Platelets: 362 10*3/uL (ref 150–400)
RBC: 3.98 MIL/uL (ref 3.87–5.11)
RDW: 13.7 % (ref 11.5–15.5)
WBC: 6.8 10*3/uL (ref 4.0–10.5)

## 2010-07-05 LAB — PROTIME-INR
INR: 2.16 — ABNORMAL HIGH (ref 0.00–1.49)
Prothrombin Time: 24.2 seconds — ABNORMAL HIGH (ref 11.6–15.2)

## 2010-07-05 LAB — LIPASE, BLOOD: Lipase: 33 U/L (ref 11–59)

## 2010-07-05 MED ORDER — IOHEXOL 300 MG/ML  SOLN: 100.0000 mL | Freq: Once | INTRAMUSCULAR | Status: AC | PRN

## 2010-07-06 LAB — BASIC METABOLIC PANEL
BUN: 13 mg/dL (ref 6–23)
CO2: 25 mEq/L (ref 19–32)
Calcium: 9.1 mg/dL (ref 8.4–10.5)
Chloride: 105 mEq/L (ref 96–112)
Creatinine, Ser: 1.21 mg/dL — ABNORMAL HIGH (ref 0.4–1.2)
GFR calc Af Amer: 53 mL/min — ABNORMAL LOW (ref >60)
GFR calc non Af Amer: 44 mL/min — ABNORMAL LOW (ref >60)
Glucose, Bld: 94 mg/dL (ref 70–99)
Potassium: 4 mEq/L (ref 3.5–5.1)
Sodium: 138 mEq/L (ref 135–145)

## 2010-07-06 LAB — SODIUM, URINE, RANDOM: Sodium, Ur: 77 mEq/L

## 2010-07-06 LAB — CREATININE, URINE, RANDOM: Creatinine, Urine: 48.2 mg/dL

## 2010-07-06 LAB — CBC
HCT: 32.3 % — ABNORMAL LOW (ref 36.0–46.0)
Hemoglobin: 10.7 g/dL — ABNORMAL LOW (ref 12.0–15.0)
MCH: 30.9 pg (ref 26.0–34.0)
MCHC: 33.1 g/dL (ref 30.0–36.0)
MCV: 93.4 fL (ref 78.0–100.0)
Platelets: 298 10*3/uL (ref 150–400)
RBC: 3.46 MIL/uL — ABNORMAL LOW (ref 3.87–5.11)
RDW: 14.1 % (ref 11.5–15.5)
WBC: 5.7 10*3/uL (ref 4.0–10.5)

## 2010-07-06 LAB — PROTIME-INR
INR: 2.53 — ABNORMAL HIGH (ref 0.00–1.49)
Prothrombin Time: 27.4 seconds — ABNORMAL HIGH (ref 11.6–15.2)

## 2010-07-06 LAB — CLOSTRIDIUM DIFFICILE BY PCR: Toxigenic C. Difficile by PCR: NEGATIVE

## 2010-07-07 LAB — MAGNESIUM: Magnesium: 1.9 mg/dL (ref 1.5–2.5)

## 2010-07-07 LAB — BASIC METABOLIC PANEL
BUN: 9 mg/dL (ref 6–23)
CO2: 24 mEq/L (ref 19–32)
Calcium: 9.1 mg/dL (ref 8.4–10.5)
Chloride: 107 mEq/L (ref 96–112)
Creatinine, Ser: 1.13 mg/dL (ref 0.4–1.2)
GFR calc Af Amer: 57 mL/min — ABNORMAL LOW (ref >60)
GFR calc non Af Amer: 47 mL/min — ABNORMAL LOW (ref >60)
Glucose, Bld: 138 mg/dL — ABNORMAL HIGH (ref 70–99)
Potassium: 3.1 mEq/L — ABNORMAL LOW (ref 3.5–5.1)
Sodium: 138 mEq/L (ref 135–145)

## 2010-07-07 LAB — URINE CULTURE
Colony Count: 55000
Culture  Setup Time: 201202110210
Special Requests: NEGATIVE

## 2010-07-07 LAB — PROTIME-INR
INR: 3.09 — ABNORMAL HIGH (ref 0.00–1.49)
Prothrombin Time: 31.9 seconds — ABNORMAL HIGH (ref 11.6–15.2)

## 2010-07-08 ENCOUNTER — Ambulatory Visit: Payer: MEDICARE | Admitting: Internal Medicine

## 2010-07-08 LAB — PROTIME-INR
INR: 2.75 — ABNORMAL HIGH (ref 0.00–1.49)
Prothrombin Time: 29.2 seconds — ABNORMAL HIGH (ref 11.6–15.2)

## 2010-07-09 ENCOUNTER — Ambulatory Visit: Payer: MEDICARE | Admitting: Internal Medicine

## 2010-07-09 DIAGNOSIS — E860 Dehydration: Secondary | ICD-10-CM

## 2010-07-09 DIAGNOSIS — Z7901 Long term (current) use of anticoagulants: Secondary | ICD-10-CM

## 2010-07-09 LAB — STOOL CULTURE

## 2010-07-16 ENCOUNTER — Ambulatory Visit
Admission: RE | Admit: 2010-07-16 | Discharge: 2010-07-16 | Disposition: A | Discharge status: Home / Self Care | Payer: MEDICARE | Source: Ambulatory Visit | Attending: Neurological Surgery | Admitting: Neurological Surgery

## 2010-07-16 ENCOUNTER — Other Ambulatory Visit (INDEPENDENT_AMBULATORY_CARE_PROVIDER_SITE_OTHER): Payer: MEDICARE | Admitting: Internal Medicine

## 2010-07-16 ENCOUNTER — Other Ambulatory Visit: Payer: Self-pay | Admitting: Neurological Surgery

## 2010-07-16 DIAGNOSIS — Z7901 Long term (current) use of anticoagulants: Secondary | ICD-10-CM

## 2010-07-16 DIAGNOSIS — M549 Dorsalgia, unspecified: Secondary | ICD-10-CM

## 2010-07-18 NOTE — Discharge Summary (Signed)
Tanya, West               ACCOUNT NO.:  [id number]  MEDICAL RECORD NO.:  [id number]           PATIENT TYPE:  I  LOCATION:  1409                         [facility]ACILITY:  [facility]  PHYSICIAN:  [name], MDDATE OF BIRTH:  06-Apr-1941  DATE OF ADMISSION:  07/05/2010 DATE OF DISCHARGE:  07/09/2010                              DISCHARGE SUMMARY   DISCHARGE DISPOSITION:  Home.  PRIMARY CARE PHYSICIAN:  [name]. Tanya Fellers, MD  GASTROENTEROLOGIST:  Tanya Kuba, MD  FINAL DISCHARGE DIAGNOSES: 1. Nausea, vomiting, diarrhea, likely secondary to viral illness. 2. Dehydration secondary to nausea, vomiting, and diarrhea. 3. Bilateral pulmonary embolus diagnosed in January 2012. 4. Status post lumbosacral laminectomy. 5. Hypertension, controlled. 6. Hyperlipidemia. 7. History of irritable bowel syndrome. 8. Osteoarthritis. 9. Glaucoma. 10.Chronic anticoagulation.  DISCHARGE MEDICATIONS: 1. Tylenol 650 mg p.o. q.4h. p.r.n. pain. 2. Xanax 0.5 mg p.o. b.i.d. 3. Bactroban 2% cream applied topically b.i.d. to skin breakdown. 4. Librium 10 mg p.o. t.i.d. 5. Lisinopril 5 mg p.o. daily. 6. MiraLax 1 scoop by mouth daily as needed for constipation. 7. Simvastatin 20 mg p.o. daily. 8. Triamterene hydrochlorothiazide 75/50 mg p.o. daily. 9. Coumadin.  The patient is to take 6 mg p.o. for the next 2 days,     February 13 and February 14, and then have her INR checked by Dr.     Lenord West on February 15.  Coumadin titrated according to the INR. 10.Xalatan 0.005% ophthalmic drops, 1 drop to each eye daily at     bedtime.  CONSULTANTS:  None.  PROCEDURES:  None.  DIAGNOSTIC STUDIES: 1. Abdominal x-ray, which shows no acute cardiopulmonary process. 2. No evidence of bowel obstruction or intraperitoneal free air. 3. Posterior lumbar fusion.  CT of the abdomen and pelvis with contrast which shows no acute findings in the abdomen or pelvis.  No evidence of small bowel obstruction.  A small  hiatal hernia.  Postsurgical changes of lung effusion and probable atelectasis of the right lung base.  Pertinent laboratory studies, C diff by PCR negative.  CODE STATUS:  Full code.  ALLERGIES:  LATEX.  CHIEF COMPLAINT:  Abdominal pain, nausea, vomiting, and diarrhea.  HISTORY OF PRESENT ILLNESS:  Please refer to the H and P dictated by nurse practitioner, [name], for details of the HPI.  However in short, this is a 70 year old female who has had multiple hospitalizations, first for laminectomy and then for bilateral pulmonary emboli.  The patient has been having abdominal discomfort with nausea since her surgery in January.  The patient's symptoms progressed and on the day of admission, she was having emesis of bilious emesis or watery stool prompting an ER visit.  HOSPITAL COURSE: 1. Nausea, vomiting, diarrhea, and abdominal pain.  The patient likely     had multifactorial etiology of her symptoms.  I suspect that the     patient may have had a gastrointestinal virus in addition to     symptoms of irritable bowel syndrome.  The patient was given bowel     rest and supported with IV fluids due to her dehydration and then  clear liquids were introduced and were advanced as tolerated to a     low-residue diet.  The patient tolerated her diet without any     difficulty and has been able to eat without any nausea or vomiting.     The patient did have 1 episode of diarrhea after she had been on     solid foods; however, as of yesterday her bowel movements have been     solid.  The patient actually marked that she felt the best she had     felt since prior to her surgery.  The stool was sent for culture     and C diff PCR.  The C diff PCR was negative and the culture was     non-yielding. 2. Dehydration.  As noted above, the patient was dehydrated due to her     nausea, vomiting, and diarrhea, and she was given IV fluids     supportively.  She has also dehydration and  is not able to maintain     her hydration without any artificial means. 3. Hypertension.  The patient once restarted on her medication came     into the normotensive range. 4. Chronic Coumadin therapy.  The patient is on chronic warfarin.  She     had been started on Flagyl empirically for presumed C diff and the     interaction between the Coumadin and Flagyl caused her INR to     become supratherapeutic.  The Coumadin was held on yesterday and     today's INR is in therapeutic range.  My recommendation is that the patient should take 6 mg of warfarin for the next 48 hours instead of her usual 6.5.  At that time, she will have her INR checked at Dr. Beryle West office and Dr. Lenord West will further titrate her Coumadin from there determining whether she should resume her usual daily dosing of 6.5 mg.  In terms of her postsurgical state and her degenerative arthritis, the patient stated that she was without any pain and any pain was controlled with Tylenol.  She had been on Vicodin and at the patient's request, I have substituted Tylenol for Vicodin.  If the patient wants to take both of them, then she would exceed her daily maximum of Tylenol, thus she is only being prescribed Tylenol 650 mg p.o. q.6h. p.r.n. pain.  Otherwise, the patient has remained stable in the hospital.  She has been ambulating in the halls, using her cane without any difficulty.  At the time of discharge, the patient is well appearing and she has no complaints and no acute distress.  She is tolerating her diet well and she has got no further nausea, vomiting or diarrhea.  A temperature today is 98.7, respiratory rate is 18, heart rate is 77, blood pressure 138/77, and her oxygen saturations are 99% on room air.  Her INR at the time of discharge is 2.75.  HEENT examination, she is normocephalic, atraumatic.  Pupils are equally round and reactive to light and accommodation.  Extraocular movements are intact.  Her  oropharynx is moist.  There is no exudate, erythema, lesions noted.  Neck examination, trachea is midline.  No masses, no thyromegaly, no JVD, no carotid bruit.  Respiratory examination, she has a normal respiratory effort. She has got equal excursion bilaterally.  No wheezing or rhonchi noted. Cardiovascular, she has got a normal S1 and S2.  No murmurs, rubs, or gallops are noted.  PMI is nondisplaced.  No  heaves or thrills on palpation.  Abdomen is obese, soft, nontender, nondistended.  No masses, no hepatosplenomegaly is noted.  Extremities show no clubbing, cyanosis, or edema.  Neurological, she has got no focal neurological deficits. Cranial nerves II-XII are grossly intact.  Musculoskeletal, she has got no warmth, swelling, or erythema around the joints.  She has got no clubbing, cyanosis, or edema, and there is no spinal tenderness noted. Psychiatric, she is alert and oriented x3.  Good insight and cognition, good recent and remote recall.  DIETARY RESTRICTIONS:  The patient should be on vitamin K stable heart healthy diet.  PHYSICAL RESTRICTIONS:  The patient is ambulatory with a cane and is currently functional at baseline.  Total time for discharge process, 42 minutes.     Tanya Harm, MD  MAM/MEDQ  D:  07/08/2010  T:  07/08/2010  Job:  [id number]  cc:   [name]. [name], M.D. Fax: [phone]  [name], M.D. Fax: [phone]  Electronically Signed by Marthann Schiller MD on 07/18/2010 07:43:40 AM

## 2010-07-23 ENCOUNTER — Other Ambulatory Visit (INDEPENDENT_AMBULATORY_CARE_PROVIDER_SITE_OTHER): Payer: MEDICARE | Admitting: Internal Medicine

## 2010-07-23 DIAGNOSIS — Z7901 Long term (current) use of anticoagulants: Secondary | ICD-10-CM

## 2010-07-26 ENCOUNTER — Other Ambulatory Visit: Payer: MEDICARE | Admitting: Internal Medicine

## 2010-08-01 NOTE — H&P (Signed)
[name], [name]               ACCOUNT NO.:  [id number]  MEDICAL RECORD NO.:  [id number]          PATIENT TYPE:  EMS  LOCATION:  MAJO                         FACILITY:  [facility]  PHYSICIAN:  Harlin Heys, MD       DATE OF BIRTH:  June 28, 1940  DATE OF ADMISSION:  06/14/2010 DATE OF DISCHARGE:                             HISTORY & PHYSICAL   CHIEF COMPLAINT:  Shortness of breath.  HISTORY OF PRESENT ILLNESS:  This 70 year old white female presents with shortness of breath that started earlier today.  She had L4-5, S1 posterior lumbar interbody fusion by Dr. Marikay Alar from January 5. This was complicated by acute blood loss requiring 2 units packed red cell transfusion and renal insufficiency was noted.  Her EGFR is 36. She has been short of breath since yesterday but no associated fever, chills, cough or upper respiratory symptoms.  She was seen by her surgeon and was given a prescription for doxycycline due to drainage at the incision site but this has improved and none have been noticed since she has been in the emergency department.  Shortness of breath seemed insidious at first but then she finally decided to come into the emergency room because she started developing some uncomfortable chest and abdominal discomfort which she initially thought was gas in the xiphisternal upper abdominal area.  This then moved over to the right side of her chest and she now notices that a deep breath makes the chest pain worse.  This is pretty typical for pleuritic chest pain.  She has, however, had no cough, hemoptysis, leg swelling or pain and has had no prior history of deep vein thrombosis or pulmonary embolism.  There is no family history of deep vein thrombosis or pulmonary embolism.  She denies use of low molecular weight heparin at the time of her surgery for VT prophylaxis, although she does recall having used foot pumps.  PAST MEDICAL HISTORY:  Left fourth and fifth finger  fracture 2007, right shoulder osteoarthritis July 2001, degenerative disk disease, degenerative joint disease status post posterior lumbar interbody fusion May 30, 2010, hypertension for many years, glaucoma for many years, hyperlipidemia, irritable bowel syndrome.  CURRENT MEDICATIONS:  Lisinopril 5 mg daily; Zocor, dose unknown; diuretic pill, dose unknown, name unknown; Skelaxin 400-800 mg 3 times a day as needed, doxycycline 100 mg b.i.d. p.o. started by Dr. Yetta Barre; Librium 2 mg daily as needed for irritable bowel.  ALLERGIES:  Latex.  REVIEW OF SYSTEMS:  12 system review performed and negative except as above in history of present illness.  FAMILY HISTORY AND SOCIAL HISTORY:  She lives at home with eight of her nine children living nearby.  She has been a lifelong nonsmoker and teetotaller.  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  No relevant family history.  No family history of DVT or pulmonary embolism.  PHYSICAL EXAM:  GENERAL:  Pleasant white female in no acute distress. VITAL SIGNS:  Temperature 97.5, pulse 97, respiratory rate 22, blood pressure 97/45. During this admission, blood pressure varied from 74-105 systolic.  Pulse ox 100% room air. During my exam, her pulse  ox briefly dropped to 84%. HEAD:  Normocephalic, atraumatic. EYES:  Full extraocular movement. ENT: Normal oropharynx. NECK:  Supple. No goiter, no lymph nodes.  JVP appears hyperdynamic. Carotid impulse normal.  No bruits. LUNGS:  Clear to auscultation. CARDIOVASCULAR:  Regular rate and rhythm.  No murmur, rub or gallop. ABDOMEN:  Soft, nontender, no masses.  Normal bowel sounds.  No bruits. RECTAL/PELVIC:  Deferred. MUSCULOSKELETAL:  No clubbing, cyanosis, edema. No clinical evidence of deep vein thrombosis in lower extremities.  No palpable cords. CNS: Awake, alert, well-oriented.  No overt focal deficits.  LAB DATA:  D-dimer 10.39.  Normal range 0 to 0.48, sodium 132, potassium 3.9, chloride  96, CO2 19, BUN 25, creatinine 1.4, EGFR 37, glucose 92, alkaline phosphatase 118, AST 22, ALT 19, total protein 7.6, albumin 3.2, WBC 13.6, hemoglobin 12, platelets 281. No bandemia.  Myoglobin 164, troponin less than 0.05.  CK-MB less than 1.  EKG: Normal sinus rhythm with new incomplete right bundle branch block and nonspecific ST-T wave changes compared with EKG from June 04, 2010, QTC prolonged, increased from 429-477 milliseconds.  IMPRESSION:  A 70 year old African American female with shortness of breath, elevated D-dimer and new incomplete right bundle branch block and low blood pressure.  DISCUSSION AND PLAN:  These findings are consistent with pulmonary embolism.  Would normally start her on immediate anticoagulation; however, since she has had fairly recent surgery would like to await CT report which she has already had performed earlier today within the last half an hour.  If this confirms clinical suspicion of pulmonary embolism, we will start with IV heparin bolus followed by heparin drip. Dr. Yetta Barre, her neurosurgeon, will be alerted about her admission.  If the CT scan does not confirm pulmonary embolism, then we will still need to observe her overnight to rule out cardiac cause for her EKG change in symptoms.  We will continue her doxycycline as this has been recommended by her neurosurgeon.  The patient's daughter will bring in her diuretic and Zocor bottles so that we can administer that the correct drug and the correct dose.  The CT scan report is just available now on the electronic record and it is confirmatory for pulmonary emboli with clots seen in the distal aspect of both main pulmonary arteries extending to the lobar segmental and subsegmental branch of the right lower lobe and the lobar and segmental branches of the left lower lobe.  Therefore, we will immediately start intravenous heparin and she will be monitored closely during the night.           ______________________________ Harlin Heys, MD     BC/MEDQ  D:  06/14/2010  T:  06/14/2010  Job:  [id number]  cc:   Dr. Yetta Barre  Electronically Signed by Harlin Heys MD on 07/31/2010 12:45:53 PM

## 2010-08-02 ENCOUNTER — Other Ambulatory Visit (INDEPENDENT_AMBULATORY_CARE_PROVIDER_SITE_OTHER): Payer: MEDICARE | Admitting: Internal Medicine

## 2010-08-02 DIAGNOSIS — Z7901 Long term (current) use of anticoagulants: Secondary | ICD-10-CM

## 2010-08-05 LAB — CBC
HCT: 37.5 % (ref 36.0–46.0)
Hemoglobin: 12.1 g/dL (ref 12.0–15.0)
MCH: 31.3 pg (ref 26.0–34.0)
MCHC: 32.3 g/dL (ref 30.0–36.0)
MCV: 97.2 fL (ref 78.0–100.0)
Platelets: 478 10*3/uL — ABNORMAL HIGH (ref 150–400)
RBC: 3.86 MIL/uL — ABNORMAL LOW (ref 3.87–5.11)
RDW: 14.1 % (ref 11.5–15.5)
WBC: 7.9 10*3/uL (ref 4.0–10.5)

## 2010-08-05 LAB — SURGICAL PCR SCREEN
MRSA, PCR: NEGATIVE
Staphylococcus aureus: NEGATIVE

## 2010-08-05 LAB — BASIC METABOLIC PANEL
BUN: 27 mg/dL — ABNORMAL HIGH (ref 6–23)
CO2: 26 mEq/L (ref 19–32)
Calcium: 9.7 mg/dL (ref 8.4–10.5)
Chloride: 104 mEq/L (ref 96–112)
Creatinine, Ser: 1.29 mg/dL — ABNORMAL HIGH (ref 0.4–1.2)
GFR calc Af Amer: 49 mL/min — ABNORMAL LOW (ref >60)
GFR calc non Af Amer: 41 mL/min — ABNORMAL LOW (ref >60)
Glucose, Bld: 88 mg/dL (ref 70–99)
Potassium: 4.5 mEq/L (ref 3.5–5.1)
Sodium: 138 mEq/L (ref 135–145)

## 2010-08-05 LAB — DIFFERENTIAL
Basophils Absolute: 0.1 10*3/uL (ref 0.0–0.1)
Basophils Relative: 1 % (ref 0–1)
Eosinophils Absolute: 0.4 10*3/uL (ref 0.0–0.7)
Eosinophils Relative: 5 % (ref 0–5)
Lymphocytes Relative: 35 % (ref 12–46)
Lymphs Abs: 2.7 10*3/uL (ref 0.7–4.0)
Monocytes Absolute: 0.7 10*3/uL (ref 0.1–1.0)
Monocytes Relative: 9 % (ref 3–12)
Neutro Abs: 4 10*3/uL (ref 1.7–7.7)
Neutrophils Relative %: 51 % (ref 43–77)

## 2010-08-05 LAB — PROTIME-INR
INR: 0.95 (ref 0.00–1.49)
Prothrombin Time: 12.9 seconds (ref 11.6–15.2)

## 2010-08-05 LAB — TYPE AND SCREEN
ABO/RH(D): A POS
Antibody Screen: NEGATIVE

## 2010-08-05 LAB — ABO/RH: ABO/RH(D): A POS

## 2010-08-05 LAB — APTT: aPTT: 29 seconds (ref 24–37)

## 2010-08-16 ENCOUNTER — Other Ambulatory Visit (INDEPENDENT_AMBULATORY_CARE_PROVIDER_SITE_OTHER): Payer: MEDICARE | Admitting: Internal Medicine

## 2010-08-16 DIAGNOSIS — Z7901 Long term (current) use of anticoagulants: Secondary | ICD-10-CM

## 2010-08-29 ENCOUNTER — Other Ambulatory Visit (INDEPENDENT_AMBULATORY_CARE_PROVIDER_SITE_OTHER): Payer: MEDICARE | Admitting: Internal Medicine

## 2010-08-29 DIAGNOSIS — Z7901 Long term (current) use of anticoagulants: Secondary | ICD-10-CM

## 2010-09-23 ENCOUNTER — Ambulatory Visit
Admission: RE | Admit: 2010-09-23 | Discharge: 2010-09-23 | Disposition: A | Discharge status: Home / Self Care | Payer: MEDICARE | Source: Ambulatory Visit | Attending: Neurological Surgery | Admitting: Neurological Surgery

## 2010-09-23 ENCOUNTER — Other Ambulatory Visit: Payer: Self-pay | Admitting: Neurological Surgery

## 2010-09-23 DIAGNOSIS — M4310 Spondylolisthesis, site unspecified: Secondary | ICD-10-CM

## 2010-09-23 DIAGNOSIS — M79609 Pain in unspecified limb: Secondary | ICD-10-CM

## 2010-09-23 DIAGNOSIS — M48061 Spinal stenosis, lumbar region without neurogenic claudication: Secondary | ICD-10-CM

## 2010-09-23 DIAGNOSIS — M7130 Other bursal cyst, unspecified site: Secondary | ICD-10-CM

## 2010-09-27 ENCOUNTER — Ambulatory Visit (INDEPENDENT_AMBULATORY_CARE_PROVIDER_SITE_OTHER): Payer: MEDICARE | Admitting: Internal Medicine

## 2010-09-27 DIAGNOSIS — I10 Essential (primary) hypertension: Secondary | ICD-10-CM

## 2010-09-27 DIAGNOSIS — F411 Generalized anxiety disorder: Secondary | ICD-10-CM

## 2010-09-27 DIAGNOSIS — T81718A Complication of other artery following a procedure, not elsewhere classified, initial encounter: Secondary | ICD-10-CM

## 2010-09-27 DIAGNOSIS — I2699 Other pulmonary embolism without acute cor pulmonale: Secondary | ICD-10-CM

## 2010-10-11 NOTE — Op Note (Signed)
   [name], [name]                         ACCOUNT NO.:  [id number]   MEDICAL RECORD NO.:  [id number]                   PATIENT TYPE:  AMB   LOCATION:  ENDO                                 FACILITY:  [facility]   PHYSICIAN:  Petra Kuba, M.D.                 DATE OF BIRTH:  [birth year]   DATE OF PROCEDURE:  12/20/2002  DATE OF DISCHARGE:                                 OPERATIVE REPORT   PROCEDURE:  Colonoscopy.   INDICATIONS FOR PROCEDURE:  Screening. Consent was signed after risks,  benefits, methods, and options were thoroughly discussed in the office in  the past.   MEDICINES USED:  Demerol 60, Versed 7.5.   DESCRIPTION OF PROCEDURE:  Rectal inspection was pertinent for external  hemorrhoids, small. Digital exam was negative. The pediatric video  adjustable colonoscope was inserted, easily advanced around the colon to the  cecum. This did require some abdominal pressure but no position changes. No  obvious abnormality was seen on insertion. The cecum was identified by the  appendiceal orifice and the ileocecal valve. In fact, the scope was inserted  a very short ways into the terminal ileum which was normal. Photo  documentation was obtained. The scope was slowly withdrawn. The prep was  adequate. There was minimal liquid stool that required washing and  suctioning. On slow withdrawal through the colon, no polyps, diverticula,  masses or other abnormalities were seen as we slowly withdrew back to the  rectum. Anal rectal pull through and retroflexion confirmed some small  hemorrhoids. The scope was reinserted and advanced a short ways up the left  side of the colon, air was suctioned, scope removed. The patient tolerated  the procedure well. There was no obvious or immediate complication.   ENDOSCOPIC DIAGNOSIS:  1. Internal and external hemorrhoids.  2. Otherwise within normal limits to the cecum and the terminal ileum.   PLAN:  Happy to see back p.r.n.  Consider upping  her pump inhibitors to  twice a day or trying some of the others like Aciphex or Protonix and  otherwise return care to Dr. Lenord Fellers for the customary health care  maintenance to include yearly rectals and guaiacs.                                               Petra Kuba, M.D.    MEM/MEDQ  D:  12/20/2002  T:  12/20/2002  Job:  [id number]   cc:   Luanna Cole. Lenord Fellers, M.D.  [address], [place]  [place]  [place] [address]  Fax: [phone]

## 2010-10-28 ENCOUNTER — Other Ambulatory Visit: Payer: Medicare Other | Admitting: Internal Medicine

## 2010-10-28 ENCOUNTER — Telehealth: Payer: Self-pay

## 2010-10-28 DIAGNOSIS — Z7901 Long term (current) use of anticoagulants: Secondary | ICD-10-CM

## 2010-10-28 LAB — PROTIME-INR
INR: 2.57 — ABNORMAL HIGH (ref <1.50)
Prothrombin Time: 27.1 seconds — ABNORMAL HIGH (ref 11.6–15.2)

## 2010-10-28 NOTE — Telephone Encounter (Signed)
Patient informed of pt/inr results. Per Dr. Lenord Fellers, patient to continue same dose of coumadin and then discontinue med on November 27, 2010

## 2010-11-28 ENCOUNTER — Other Ambulatory Visit: Payer: Self-pay | Admitting: Internal Medicine

## 2010-12-02 ENCOUNTER — Other Ambulatory Visit: Payer: Self-pay | Admitting: Neurological Surgery

## 2010-12-02 ENCOUNTER — Ambulatory Visit
Admission: RE | Admit: 2010-12-02 | Discharge: 2010-12-02 | Disposition: A | Discharge status: Home / Self Care | Payer: Medicare Other | Source: Ambulatory Visit | Attending: Neurological Surgery | Admitting: Neurological Surgery

## 2010-12-02 DIAGNOSIS — M48061 Spinal stenosis, lumbar region without neurogenic claudication: Secondary | ICD-10-CM

## 2010-12-02 DIAGNOSIS — M79609 Pain in unspecified limb: Secondary | ICD-10-CM

## 2010-12-02 DIAGNOSIS — M4310 Spondylolisthesis, site unspecified: Secondary | ICD-10-CM

## 2010-12-10 ENCOUNTER — Telehealth: Payer: Self-pay | Admitting: Internal Medicine

## 2010-12-10 NOTE — Telephone Encounter (Signed)
Yes- have her take baby aspirin once daily.

## 2011-01-13 ENCOUNTER — Telehealth: Payer: Self-pay | Admitting: Internal Medicine

## 2011-01-13 ENCOUNTER — Encounter: Payer: Self-pay | Admitting: Internal Medicine

## 2011-01-13 ENCOUNTER — Ambulatory Visit (INDEPENDENT_AMBULATORY_CARE_PROVIDER_SITE_OTHER): Payer: Medicare Other | Admitting: Internal Medicine

## 2011-01-13 VITALS — BP 142/72 | HR 90 | Temp 99.3°F | Ht 61.0 in | Wt 177.0 lb

## 2011-01-13 DIAGNOSIS — Z86711 Personal history of pulmonary embolism: Secondary | ICD-10-CM

## 2011-01-13 DIAGNOSIS — J40 Bronchitis, not specified as acute or chronic: Secondary | ICD-10-CM

## 2011-01-13 DIAGNOSIS — E785 Hyperlipidemia, unspecified: Secondary | ICD-10-CM | POA: Insufficient documentation

## 2011-01-13 DIAGNOSIS — K219 Gastro-esophageal reflux disease without esophagitis: Secondary | ICD-10-CM

## 2011-01-13 DIAGNOSIS — E119 Type 2 diabetes mellitus without complications: Secondary | ICD-10-CM

## 2011-01-13 DIAGNOSIS — H409 Unspecified glaucoma: Secondary | ICD-10-CM | POA: Insufficient documentation

## 2011-01-13 DIAGNOSIS — K589 Irritable bowel syndrome without diarrhea: Secondary | ICD-10-CM

## 2011-01-13 DIAGNOSIS — I10 Essential (primary) hypertension: Secondary | ICD-10-CM

## 2011-01-13 NOTE — Telephone Encounter (Signed)
Prescription was signed generic. Pharmacist is free to substitute something else that is comparable.

## 2011-01-13 NOTE — Telephone Encounter (Signed)
Pharmacist says generic price is $41 at [facility] and the only thing that is cheaper is Robitussin DM OTC. Pt given 6x 5cc bottles of Zutripro from our office.

## 2011-01-13 NOTE — Progress Notes (Signed)
  Subjective:    Patient ID: [name], female    DOB: 01/09/1941, 70 y.o.   MRN: [id number]  HPI and patient say is she has head a cough for several weeks. More recently has developed an acute respiratory infection that started out as sore throat with some wheezing and a lot of coughing. Cannot be specific him exactly when all of this started. Says sputum was discolored. No fever or shaking chills. History of diabetes mellitus with hemoglobin A1c 6.4% December 2011. Doesn't watch Accu-Cheks on a regular basis and is diet controlled. History of hypertension, history of hyperlipidemia. Developed a pulmonary embolus after back surgery January 2012 and was on Coumadin until July 2012. Back surgery consisted of a 2 level lumbar fusion .    Review of Systems     Objective:   Physical Exam HEENT: TMs are slightly full, pharynx is clear. Neck is supple without significant adenopathy; chest is clear to auscultation; cardiac exam regular rate and rhythm normal S1 and S2        Assessment & Plan:  Bronchitis  Hypertension  Hyperlipidemia  History of diabetes mellitus  Status post 2 level lumbar fusion January 2012 with postoperative pulmonary embolism  Levaquin 500 mg daily for 10 days. Hycodan 8 ounces 1 teaspoon by mouth every 6 hours as needed for cough. In 2 weeks patient should have fasting lab work followed by office visit including lipid panel liver functions TSH and hemoglobin A1c. Last lab work to address these issues was December 2011.

## 2011-02-03 ENCOUNTER — Other Ambulatory Visit: Payer: Medicare Other | Admitting: Internal Medicine

## 2011-02-03 DIAGNOSIS — E119 Type 2 diabetes mellitus without complications: Secondary | ICD-10-CM

## 2011-02-03 DIAGNOSIS — E785 Hyperlipidemia, unspecified: Secondary | ICD-10-CM

## 2011-02-03 DIAGNOSIS — E039 Hypothyroidism, unspecified: Secondary | ICD-10-CM

## 2011-02-03 LAB — HEPATIC FUNCTION PANEL
ALT: 12 U/L (ref 0–35)
AST: 18 U/L (ref 0–37)
Albumin: 4 g/dL (ref 3.5–5.2)
Alkaline Phosphatase: 66 U/L (ref 39–117)
Bilirubin, Direct: 0.1 mg/dL (ref 0.0–0.3)
Indirect Bilirubin: 0.4 mg/dL (ref 0.0–0.9)
Total Bilirubin: 0.5 mg/dL (ref 0.3–1.2)
Total Protein: 7 g/dL (ref 6.0–8.3)

## 2011-02-03 LAB — LIPID PANEL
Cholesterol: 179 mg/dL (ref 0–200)
HDL: 47 mg/dL (ref >39)
LDL Cholesterol: 115 mg/dL — ABNORMAL HIGH (ref 0–99)
Total CHOL/HDL Ratio: 3.8 Ratio
Triglycerides: 86 mg/dL (ref <150)
VLDL: 17 mg/dL (ref 0–40)

## 2011-02-03 LAB — HEMOGLOBIN A1C
Hgb A1c MFr Bld: 6.3 % — ABNORMAL HIGH (ref <5.7)
Mean Plasma Glucose: 134 mg/dL — ABNORMAL HIGH (ref <117)

## 2011-02-03 LAB — TSH: TSH: 1.365 u[IU]/mL (ref 0.350–4.500)

## 2011-02-04 ENCOUNTER — Ambulatory Visit (INDEPENDENT_AMBULATORY_CARE_PROVIDER_SITE_OTHER): Payer: Medicare Other | Admitting: Internal Medicine

## 2011-02-04 ENCOUNTER — Encounter: Payer: Self-pay | Admitting: Internal Medicine

## 2011-02-04 VITALS — BP 121/70 | HR 64 | Temp 97.1°F | Ht 61.0 in | Wt 180.0 lb

## 2011-02-04 DIAGNOSIS — E785 Hyperlipidemia, unspecified: Secondary | ICD-10-CM

## 2011-02-04 DIAGNOSIS — F419 Anxiety disorder, unspecified: Secondary | ICD-10-CM

## 2011-02-04 DIAGNOSIS — Z23 Encounter for immunization: Secondary | ICD-10-CM

## 2011-02-04 DIAGNOSIS — H409 Unspecified glaucoma: Secondary | ICD-10-CM

## 2011-02-04 DIAGNOSIS — I10 Essential (primary) hypertension: Secondary | ICD-10-CM

## 2011-02-04 DIAGNOSIS — R300 Dysuria: Secondary | ICD-10-CM

## 2011-02-04 DIAGNOSIS — E119 Type 2 diabetes mellitus without complications: Secondary | ICD-10-CM

## 2011-02-04 DIAGNOSIS — F411 Generalized anxiety disorder: Secondary | ICD-10-CM

## 2011-02-04 LAB — POCT URINALYSIS DIPSTICK
Bilirubin, UA: NEGATIVE
Blood, UA: NEGATIVE
Glucose, UA: NEGATIVE
Ketones, UA: NEGATIVE
Nitrite, UA: NEGATIVE
Protein, UA: NEGATIVE
Spec Grav, UA: 1.01
Urobilinogen, UA: NEGATIVE
pH, UA: 7

## 2011-02-06 LAB — URINE CULTURE
Colony Count: NO GROWTH
Organism ID, Bacteria: NO GROWTH

## 2011-02-19 NOTE — Progress Notes (Signed)
  Subjective:    Patient ID: [name], female    DOB: 1940-08-05, 70 y.o.   MRN: [id number]  HPI 70 year old black female with history of hypertension, diabetes mellitus, hyperlipidemia, GE reflux, glaucoma,-year-old bowel syndrome and pulmonary embolus status post back surgery now off of Coumadin. Blood pressure well controlled on current regimen. Is on Zocor generic for hyperlipidemia. Diabetes is diet controlled. Also has some anxiety.    Review of Systems     Objective:   Physical Exam neck no thyromegaly or carotid bruits; chest clear; cardiac exam regular rate and rhythm normal S1 and S2; extremities without edema; diabetic foot exam pulses are normal, no foot ulcers        Assessment & Plan:  Hypertension  Hyperlipidemia  Diabetes mellitus-diet controlled  GE reflux  Curable bowel syndrome  Glaucoma  History of pulmonary embolus status post  back surgery  Fasting labs are drawn and are pending  Return in 6 months for physical exam.

## 2011-03-03 ENCOUNTER — Ambulatory Visit
Admission: RE | Admit: 2011-03-03 | Discharge: 2011-03-03 | Disposition: A | Discharge status: Home / Self Care | Payer: Medicare Other | Source: Ambulatory Visit | Attending: Neurological Surgery | Admitting: Neurological Surgery

## 2011-03-03 ENCOUNTER — Other Ambulatory Visit: Payer: Self-pay | Admitting: Neurological Surgery

## 2011-03-03 DIAGNOSIS — M79609 Pain in unspecified limb: Secondary | ICD-10-CM

## 2011-03-03 DIAGNOSIS — M7130 Other bursal cyst, unspecified site: Secondary | ICD-10-CM

## 2011-03-21 ENCOUNTER — Other Ambulatory Visit: Payer: Self-pay | Admitting: Internal Medicine

## 2011-04-24 IMAGING — CR DG LUMBAR SPINE 2-3V
3 series · 3 of 3 positions shown · non-contrast
Comparison: Abdomen films of 07/05/2010 and intraoperative C-arm
spot film of 05/30/2010

CLINICAL DATA: Lumbar spine fusion May 2010, some low back pain

LUMBAR SPINE - 2-3 VIEW

[t l-spine a.p.]
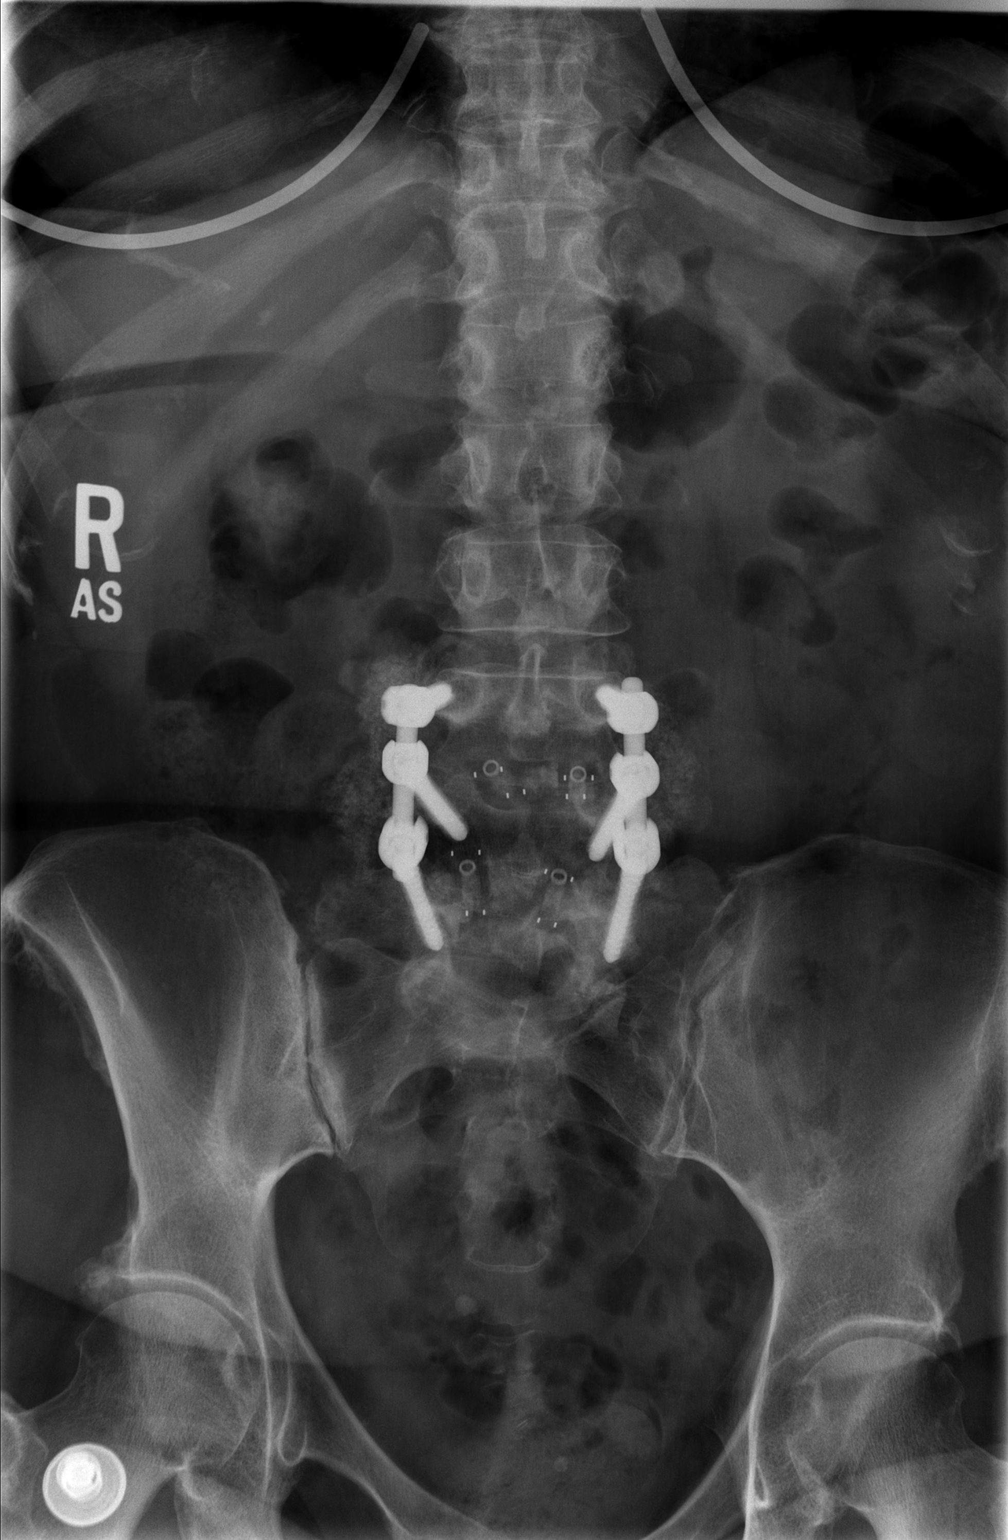

[t l-spine lat]
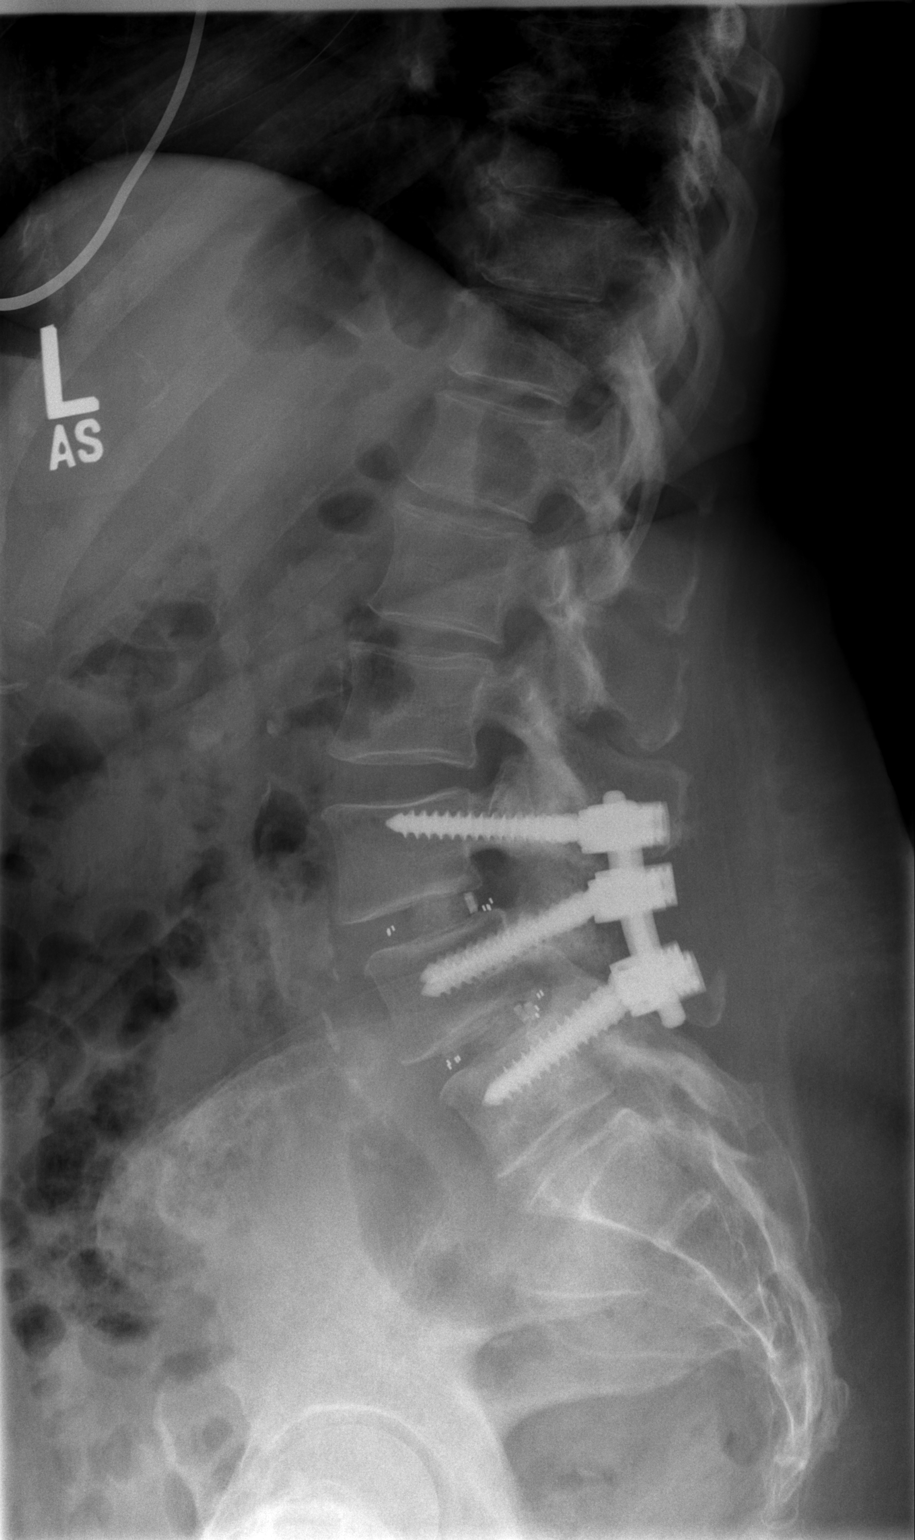

[t l-spine l5-s1 spot]
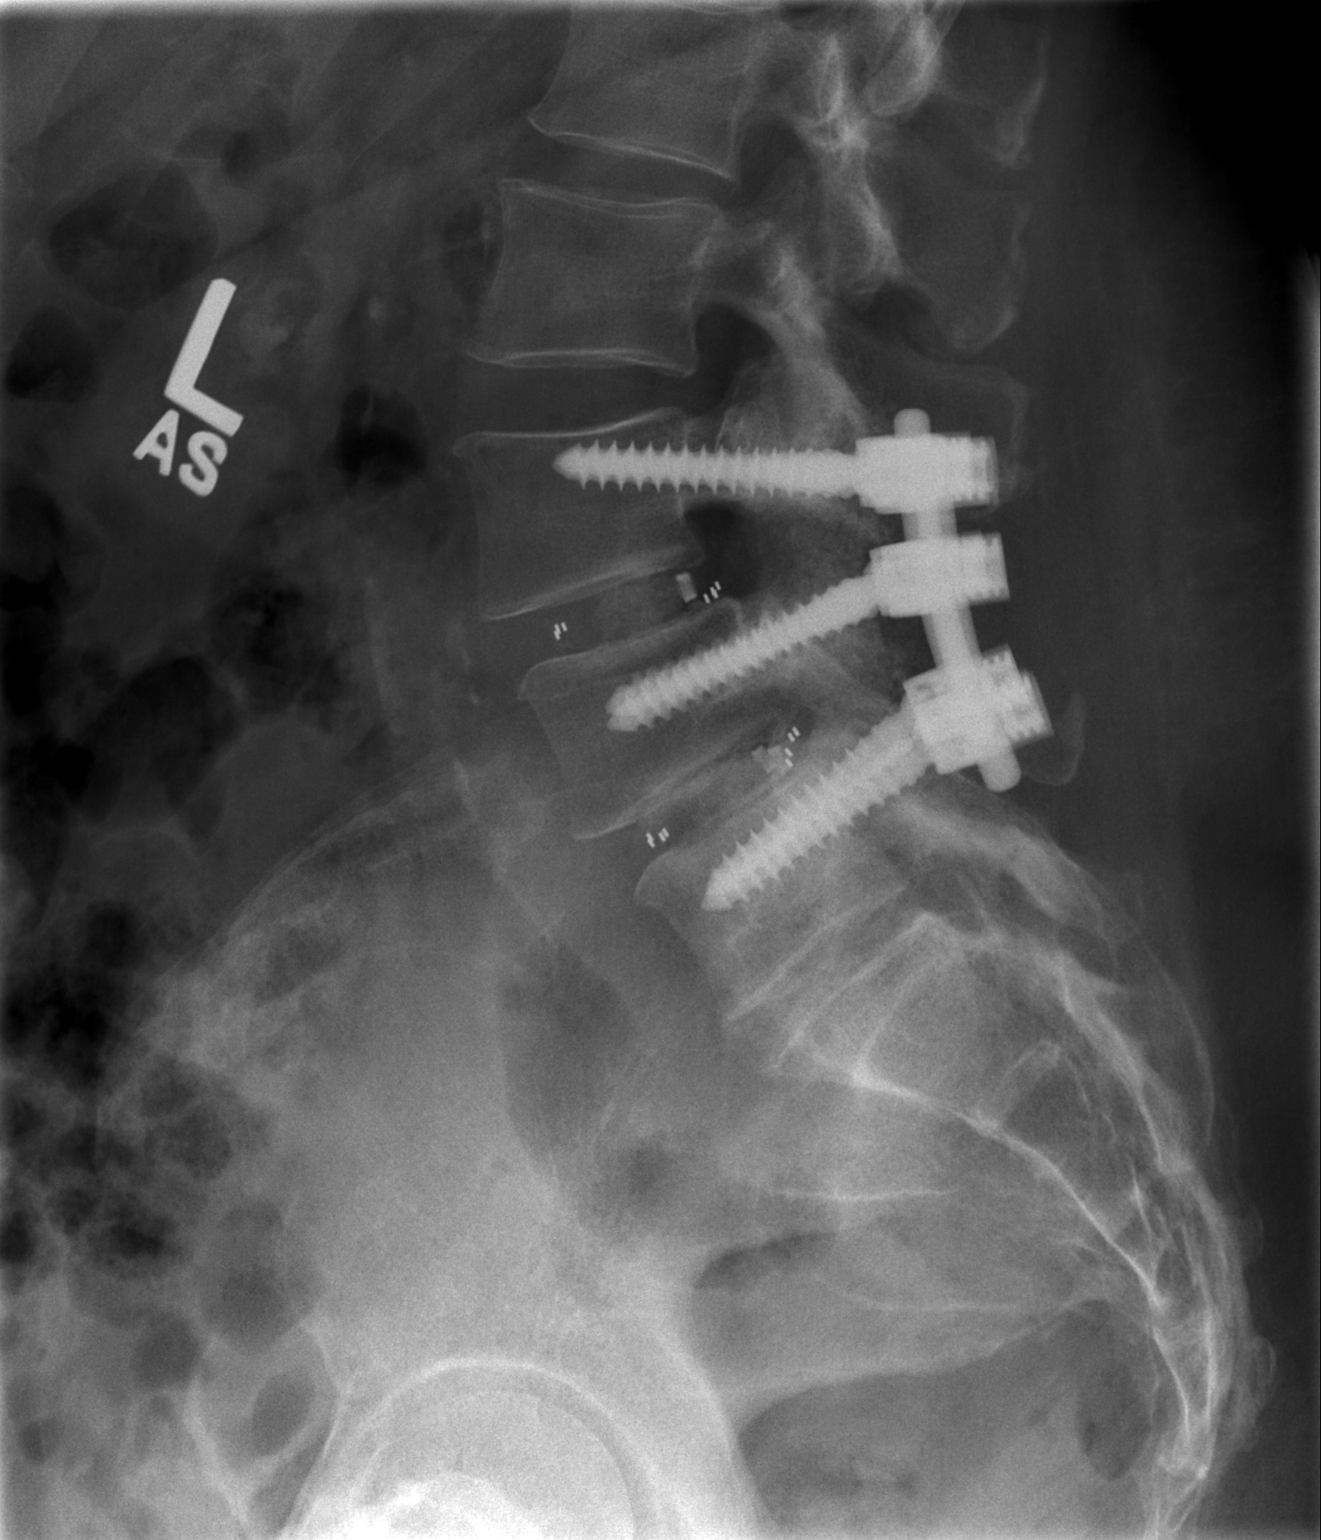

[3 of 3 positions shown; findings below may reference images not displayed]

FINDINGS: Hardware for posterior fusion from L3-L5 appears stable.
Interbody fusion plugs at L3-4 and L4-5 are unchanged in position,
and normal alignment is maintained.  No complicating features are
seen.
IMPRESSION: Stable posterior fusion from L3-L5.  Normal alignment.

## 2011-05-19 ENCOUNTER — Encounter: Payer: Self-pay | Admitting: Internal Medicine

## 2011-05-19 ENCOUNTER — Ambulatory Visit (INDEPENDENT_AMBULATORY_CARE_PROVIDER_SITE_OTHER): Payer: Medicare Other | Admitting: Internal Medicine

## 2011-05-19 VITALS — BP 110/60 | HR 80 | Temp 97.7°F | Wt 178.0 lb

## 2011-05-19 DIAGNOSIS — J069 Acute upper respiratory infection, unspecified: Secondary | ICD-10-CM

## 2011-05-19 DIAGNOSIS — J040 Acute laryngitis: Secondary | ICD-10-CM

## 2011-05-19 NOTE — Patient Instructions (Signed)
Take Augmentin 3 times daily with food for 10 days. Take cough syrup at bedtime. Call if not better in 10 days.

## 2011-05-19 NOTE — Progress Notes (Signed)
  Subjective:    Patient ID: [name], female    DOB: 11-Jul-1940, 70 y.o.   MRN: [id number]  HPI patient reports having URI symptoms for several weeks. Now has laryngitis. Has cough with discolored sputum production. No fever or shaking chills. Has had recent sore throat. Did have influenza vaccine.    Review of Systems     Objective:   Physical Exam HEENT exam: Pharynx is slightly injected without exudate. TMs are clear; neck is supple. Chest clear        Assessment & Plan:  Upper respiratory infection  Laryngitis  Plan: Augmentin 500 mg 3 times daily for 10 days. Samples of Rezira cough syrup 1 teaspoon by mouth at bedtime. Call if not better in 10 days.

## 2011-05-22 ENCOUNTER — Telehealth: Payer: Self-pay | Admitting: Internal Medicine

## 2011-05-22 MED ORDER — AZITHROMYCIN 250 MG PO TABS: ORAL_TABLET | ORAL | Status: DC

## 2011-05-22 NOTE — Telephone Encounter (Signed)
Call in Zithromax Z pak instead.

## 2011-05-26 ENCOUNTER — Telehealth: Payer: Self-pay | Admitting: Internal Medicine

## 2011-05-26 NOTE — Telephone Encounter (Signed)
Patient called this morning saying she had severe headache. Had not slept well last night. She called a few days ago saying that Augmentin and given her diarrhea and she was placed on a Zithromax Z-Pak. Complaining of head congestion. Recommended Sudafed only for 3 days with history of hypertension. She will get some over-the-counter. Called in Vicodin 5/500 (#40) 1 by mouth every 6 hours when necessary pain to [facility] Dr. Ginette Otto with no refill

## 2011-06-19 ENCOUNTER — Other Ambulatory Visit: Payer: Self-pay | Admitting: Internal Medicine

## 2011-06-19 MED ORDER — MUPIROCIN 2 % EX OINT: TOPICAL_OINTMENT | CUTANEOUS | Status: DC

## 2011-06-19 NOTE — Telephone Encounter (Signed)
I believe this is Bactroban topical ointment. Please refill for one year to use bid prn to [facility]

## 2011-06-23 ENCOUNTER — Telehealth: Payer: Self-pay | Admitting: Internal Medicine

## 2011-06-23 NOTE — Telephone Encounter (Signed)
Please refill these for one year each to [facility].

## 2011-06-24 ENCOUNTER — Other Ambulatory Visit: Payer: Self-pay

## 2011-06-24 MED ORDER — SIMVASTATIN 20 MG PO TABS: 20.0000 mg | ORAL_TABLET | Freq: Every day | ORAL | Status: DC

## 2011-06-24 MED ORDER — LISINOPRIL 5 MG PO TABS: 5.0000 mg | ORAL_TABLET | Freq: Every day | ORAL | Status: DC

## 2011-06-27 ENCOUNTER — Other Ambulatory Visit: Payer: Self-pay | Admitting: Internal Medicine

## 2011-07-01 ENCOUNTER — Other Ambulatory Visit: Payer: Self-pay | Admitting: Neurological Surgery

## 2011-07-01 ENCOUNTER — Ambulatory Visit
Admission: RE | Admit: 2011-07-01 | Discharge: 2011-07-01 | Disposition: A | Discharge status: Home / Self Care | Payer: Medicare Other | Source: Ambulatory Visit | Attending: Neurological Surgery | Admitting: Neurological Surgery

## 2011-07-01 DIAGNOSIS — M4310 Spondylolisthesis, site unspecified: Secondary | ICD-10-CM

## 2011-07-01 DIAGNOSIS — M79609 Pain in unspecified limb: Secondary | ICD-10-CM

## 2011-07-01 DIAGNOSIS — M48061 Spinal stenosis, lumbar region without neurogenic claudication: Secondary | ICD-10-CM

## 2011-07-14 ENCOUNTER — Telehealth: Payer: Self-pay | Admitting: Internal Medicine

## 2011-07-14 MED ORDER — CEPHALEXIN 500 MG PO CAPS: 500.0000 mg | ORAL_CAPSULE | Freq: Four times a day (QID) | ORAL | Status: DC

## 2011-07-14 NOTE — Telephone Encounter (Signed)
Rx called to [facility] for Keflex 500mg , as dir . Patient informed

## 2011-07-14 NOTE — Telephone Encounter (Signed)
Call in Keflex 500 mg qid x 7 days with no refill for this cyst.

## 2011-09-29 ENCOUNTER — Encounter: Payer: Self-pay | Admitting: Internal Medicine

## 2011-09-29 ENCOUNTER — Ambulatory Visit (INDEPENDENT_AMBULATORY_CARE_PROVIDER_SITE_OTHER): Payer: Medicare Other | Admitting: Internal Medicine

## 2011-09-29 VITALS — BP 114/66 | HR 68 | Temp 97.7°F | Wt 182.0 lb

## 2011-09-29 DIAGNOSIS — E785 Hyperlipidemia, unspecified: Secondary | ICD-10-CM

## 2011-09-29 DIAGNOSIS — M7542 Impingement syndrome of left shoulder: Secondary | ICD-10-CM

## 2011-09-29 DIAGNOSIS — M25519 Pain in unspecified shoulder: Secondary | ICD-10-CM

## 2011-09-29 DIAGNOSIS — I10 Essential (primary) hypertension: Secondary | ICD-10-CM

## 2011-09-29 DIAGNOSIS — M25819 Other specified joint disorders, unspecified shoulder: Secondary | ICD-10-CM

## 2011-09-29 DIAGNOSIS — M7541 Impingement syndrome of right shoulder: Secondary | ICD-10-CM

## 2011-09-29 MED ORDER — METHYLPREDNISOLONE ACETATE 80 MG/ML IJ SUSP
80.0000 mg | Freq: Once | INTRAMUSCULAR | Status: AC
  Administered 2011-09-29 (×2): 80 mg via INTRA_ARTICULAR

## 2011-09-29 MED ORDER — METHYLPREDNISOLONE ACETATE 80 MG/ML IJ SUSP: 80.0000 mg | Freq: Once | INTRAMUSCULAR | Status: DC

## 2011-09-29 NOTE — Patient Instructions (Signed)
You have been given bilateral shoulder injections today. Please take Vicodin sparingly for pain. Call if not better in 2 weeks and we will refer you to  specialist

## 2011-09-29 NOTE — Progress Notes (Signed)
  Subjective:    Patient ID: [name], female    DOB: [birth year], 71 y.o.   MRN: [id number]  HPI 71 year old white female with history of hypertension, anxiety, diabetes mellitus in today complaining of bilateral shoulder pain. Says right shoulder is much more painful than the left. Has problems sleeping because of shoulder pain. Patient says at times both arms feel weak. Has not dropped anything. Decreased range of motion both shoulders. Painful to elevate right shoulder up over her head fail but has ability to do so.    Review of Systems     Objective:   Physical Exam pain with extending right upper extremity up over her head. Says pain localized to right shoulder joint and deltoid area. Better range of motion with left upper extremity. Less pain.        Assessment & Plan:  Bilateral shoulder impingement  Plan: Patient declines x-ray at this point in time. She requests bilateral shoulder injections. Each shoulder injected with 1 cc Depo-Medrol, 1/2 cc lidocaine, 1 cc Marcaine. Vicodin 5/500 (#60) 1 by mouth Q8 hours when necessary pain. Call if not better in 2 weeks

## 2011-11-26 ENCOUNTER — Other Ambulatory Visit: Payer: Self-pay | Admitting: Internal Medicine

## 2011-12-23 ENCOUNTER — Other Ambulatory Visit: Payer: Self-pay | Admitting: Internal Medicine

## 2012-01-16 ENCOUNTER — Other Ambulatory Visit: Payer: Self-pay

## 2012-01-16 MED ORDER — POLYETHYLENE GLYCOL 3350 17 GM/SCOOP PO POWD: 17.0000 g | Freq: Every day | ORAL | Status: DC

## 2012-01-27 ENCOUNTER — Encounter: Payer: Self-pay | Admitting: Internal Medicine

## 2012-01-27 ENCOUNTER — Ambulatory Visit (INDEPENDENT_AMBULATORY_CARE_PROVIDER_SITE_OTHER): Payer: Medicare Other | Admitting: Internal Medicine

## 2012-01-27 ENCOUNTER — Other Ambulatory Visit: Payer: Self-pay | Admitting: Internal Medicine

## 2012-01-27 VITALS — BP 92/58 | HR 76 | Temp 98.5°F | Ht 61.0 in | Wt 180.0 lb

## 2012-01-27 DIAGNOSIS — M25531 Pain in right wrist: Secondary | ICD-10-CM

## 2012-01-27 DIAGNOSIS — M25539 Pain in unspecified wrist: Secondary | ICD-10-CM

## 2012-01-27 DIAGNOSIS — M25519 Pain in unspecified shoulder: Secondary | ICD-10-CM

## 2012-01-27 LAB — URIC ACID: Uric Acid, Serum: 7.6 mg/dL — ABNORMAL HIGH (ref 2.4–7.0)

## 2012-01-28 LAB — CYCLIC CITRUL PEPTIDE ANTIBODY, IGG: Cyclic Citrullin Peptide Ab: 41.5 U/mL — ABNORMAL HIGH (ref 0.0–5.0)

## 2012-01-28 LAB — ANTI-NUCLEAR AB-TITER (ANA TITER): ANA Titer 1: 1:40 {titer} — ABNORMAL HIGH

## 2012-01-28 LAB — ANA: Anti Nuclear Antibody(ANA): POSITIVE — AB

## 2012-01-28 LAB — SEDIMENTATION RATE: Sed Rate: 94 mm/hr — ABNORMAL HIGH (ref 0–22)

## 2012-01-28 NOTE — Progress Notes (Signed)
  Subjective:    Patient ID: [name], female    DOB: 06-13-1940, 71 y.o.   MRN: [id number]  HPI 71 year old black female who has had shoulder pain from time to time treated with intermittent injections of Depo-Medrol, Marcaine, and Xylocaine presents with bilateral shoulder aching over the past several days. No fever or chills. Patient also complaining of bilateral wrist pain. This is a new complaint. Patient says she's tried various treatments such as soaking her wrists in Epsom salt water which did not help. Able to continue to do activities of daily living but having significant aching in both shoulders and both wrists.       Review of Systems     Objective:   Physical Exam  She is able to raise arms up over her hand and has good range of motion. Tender in both glenohumeral joints. Dorsum of both wrists has increased warmth but no redness. Tender both wrists to palpation. Slight decreased range of motion with flexion and extension of both wrists.          Assessment & Plan:  Acute arthralgias both shoulders and both wrists  Plan: CCP, sed rate, ANA Vicodin 5/500 0ne tab po q 6 hours prn pain # 60  Addendum:  ANA positive speckled pattern with titer 1:40 ; Anti-Smith Antibody is negative.  CCP is elevated at 41.5 .  Refer to Rheumatologist.

## 2012-01-29 LAB — ANTI-SMITH ANTIBODY: ENA SM Ab Ser-aCnc: <1 AU/mL (ref <30)

## 2012-01-29 NOTE — Patient Instructions (Addendum)
Take Vicodin as needed for pain. Await lab results .

## 2012-02-04 ENCOUNTER — Telehealth: Payer: Self-pay

## 2012-02-06 NOTE — Telephone Encounter (Signed)
Patient informed of her appointment with Dr. Azzie Roup

## 2012-02-12 ENCOUNTER — Encounter: Payer: Self-pay | Admitting: Internal Medicine

## 2012-02-12 ENCOUNTER — Ambulatory Visit (INDEPENDENT_AMBULATORY_CARE_PROVIDER_SITE_OTHER): Payer: Medicare Other | Admitting: Internal Medicine

## 2012-02-12 ENCOUNTER — Other Ambulatory Visit: Payer: Self-pay | Admitting: Internal Medicine

## 2012-02-12 VITALS — BP 98/58 | HR 72 | Temp 98.0°F | Ht 61.0 in | Wt 179.0 lb

## 2012-02-12 DIAGNOSIS — F419 Anxiety disorder, unspecified: Secondary | ICD-10-CM

## 2012-02-12 DIAGNOSIS — F411 Generalized anxiety disorder: Secondary | ICD-10-CM

## 2012-02-12 DIAGNOSIS — R1013 Epigastric pain: Secondary | ICD-10-CM

## 2012-02-12 DIAGNOSIS — I10 Essential (primary) hypertension: Secondary | ICD-10-CM

## 2012-02-12 DIAGNOSIS — Z23 Encounter for immunization: Secondary | ICD-10-CM

## 2012-02-12 DIAGNOSIS — M069 Rheumatoid arthritis, unspecified: Secondary | ICD-10-CM

## 2012-02-12 DIAGNOSIS — K3189 Other diseases of stomach and duodenum: Secondary | ICD-10-CM

## 2012-02-13 LAB — H. PYLORI ANTIBODY, IGG: H Pylori IgG: <0.4 {ISR}

## 2012-02-16 ENCOUNTER — Encounter: Payer: Self-pay | Admitting: Internal Medicine

## 2012-02-16 NOTE — Patient Instructions (Addendum)
Continue treatment with rheumatologist. We are checking H. pylori antibody today because of complaint of epigastric pain. Return every 6 months.

## 2012-02-16 NOTE — Progress Notes (Signed)
  Subjective:    Patient ID: [name], female    DOB: [birth year], 71 y.o.   MRN: [id number]  HPI Has been diagnosed with rheumatoid arthritis by rheumatologist and has been placed on injectable methotrexate weekly. Rheumatologist injected her left shoulder which gave her some relief. Patient complaining of some vague epigastric pain. H. pylori antibody drawn today. She does have a history of irritable bowel syndrome and anxiety. I think she was very worried about her diagnosis of rheumatoid arthritis. Explained to her that this is likely to be well controlled with minimal medication at this point. She's pleased to hear that.  History of hypertension, hyperlipidemia, and anxiety.    Review of Systems     Objective:   Physical Exam improved range of motion left shoulder with decreased tenderness. Neck is supple without thyromegaly. Chest clear to auscultation. Cardiac exam regular rate and rhythm normal S1 and S2. Extremities without edema. No hot tender joints on joint exam. Abdomen no hepatosplenomegaly masses or significant tenderness        Assessment & Plan:  Rheumatoid arthritis-anxious about recent diagnosis. Improvement in left shoulder pain with injection by rheumatologist.   Hypertension-stable  Hyperlipidemia  Anxiety-reassure patient  Epigastric pain-etiology unclear but has history of variable bowel syndrome. H. pylori antibody drawn today. She has been taking Prilosec 20 mg over-the-counter. Advised increasing Prilosec to 20 mg twice daily for a month.  Addendum: H. pylori antibody is negative

## 2012-03-16 ENCOUNTER — Other Ambulatory Visit: Payer: Self-pay | Admitting: Internal Medicine

## 2012-03-17 ENCOUNTER — Other Ambulatory Visit: Payer: Self-pay | Admitting: Internal Medicine

## 2012-03-28 ENCOUNTER — Other Ambulatory Visit: Payer: Self-pay | Admitting: Internal Medicine

## 2012-05-03 ENCOUNTER — Other Ambulatory Visit: Payer: Self-pay | Admitting: Internal Medicine

## 2012-06-01 ENCOUNTER — Ambulatory Visit (INDEPENDENT_AMBULATORY_CARE_PROVIDER_SITE_OTHER): Payer: Medicare Other | Admitting: Internal Medicine

## 2012-06-01 ENCOUNTER — Encounter: Payer: Self-pay | Admitting: Internal Medicine

## 2012-06-01 VITALS — BP 132/74 | Temp 98.5°F | Wt 179.0 lb

## 2012-06-01 DIAGNOSIS — N898 Other specified noninflammatory disorders of vagina: Secondary | ICD-10-CM

## 2012-06-01 NOTE — Patient Instructions (Addendum)
Take sitz baths for 20 minutes twice a day until healed. Call if symptoms persist, worsen or do not improve

## 2012-06-01 NOTE — Progress Notes (Signed)
  Subjective:    Patient ID: [name], female    DOB: 1941-01-01, 72 y.o.   MRN: [id number]  HPI  A couple of weeks ago, noticed lesion in right vaginal wall near introitus that has been concerning to her. It does not hurt. No drainage.    Review of Systems     Objective:   Physical Exam 7 mm yellowish cystic lesion right vaginal wall near introitus. The first tried to aspirate the the cystic area but no fluid was returned. Then after anesthesia with 1% Xylocaine, used a scalpel to open it and drain it. No complications. Only had slight bleeding.        Assessment & Plan:  Vaginal cyst  Plan: Sitz baths for 20 minutes twice daily until healed.

## 2012-06-25 ENCOUNTER — Other Ambulatory Visit: Payer: Self-pay | Admitting: Internal Medicine

## 2012-06-27 ENCOUNTER — Other Ambulatory Visit: Payer: Self-pay | Admitting: Internal Medicine

## 2012-08-24 ENCOUNTER — Other Ambulatory Visit: Payer: Self-pay | Admitting: Internal Medicine

## 2012-09-13 ENCOUNTER — Other Ambulatory Visit: Payer: Self-pay | Admitting: Internal Medicine

## 2012-09-21 ENCOUNTER — Encounter: Payer: Self-pay | Admitting: Internal Medicine

## 2012-09-21 ENCOUNTER — Other Ambulatory Visit: Payer: Self-pay | Admitting: Internal Medicine

## 2012-09-21 ENCOUNTER — Ambulatory Visit (INDEPENDENT_AMBULATORY_CARE_PROVIDER_SITE_OTHER): Payer: Medicare Other | Admitting: Internal Medicine

## 2012-09-21 VITALS — BP 126/72 | HR 76 | Temp 97.8°F | Wt 172.0 lb

## 2012-09-21 DIAGNOSIS — E119 Type 2 diabetes mellitus without complications: Secondary | ICD-10-CM

## 2012-09-21 DIAGNOSIS — E785 Hyperlipidemia, unspecified: Secondary | ICD-10-CM

## 2012-09-21 DIAGNOSIS — M069 Rheumatoid arthritis, unspecified: Secondary | ICD-10-CM

## 2012-09-21 DIAGNOSIS — I10 Essential (primary) hypertension: Secondary | ICD-10-CM

## 2012-09-21 DIAGNOSIS — K589 Irritable bowel syndrome without diarrhea: Secondary | ICD-10-CM

## 2012-09-21 DIAGNOSIS — K219 Gastro-esophageal reflux disease without esophagitis: Secondary | ICD-10-CM

## 2012-09-21 LAB — LIPID PANEL
Cholesterol: 226 mg/dL — ABNORMAL HIGH (ref 0–200)
HDL: 64 mg/dL (ref >39)
LDL Cholesterol: 144 mg/dL — ABNORMAL HIGH (ref 0–99)
Total CHOL/HDL Ratio: 3.5 Ratio
Triglycerides: 91 mg/dL (ref <150)
VLDL: 18 mg/dL (ref 0–40)

## 2012-09-21 LAB — BASIC METABOLIC PANEL
BUN: 30 mg/dL — ABNORMAL HIGH (ref 6–23)
CO2: 23 mEq/L (ref 19–32)
Calcium: 10 mg/dL (ref 8.4–10.5)
Chloride: 99 mEq/L (ref 96–112)
Creat: 1.04 mg/dL (ref 0.50–1.10)
Glucose, Bld: 88 mg/dL (ref 70–99)
Potassium: 4.4 mEq/L (ref 3.5–5.3)
Sodium: 135 mEq/L (ref 135–145)

## 2012-09-21 LAB — HEPATIC FUNCTION PANEL
ALT: 16 U/L (ref 0–35)
AST: 24 U/L (ref 0–37)
Albumin: 4.4 g/dL (ref 3.5–5.2)
Alkaline Phosphatase: 63 U/L (ref 39–117)
Bilirubin, Direct: 0.1 mg/dL (ref 0.0–0.3)
Indirect Bilirubin: 0.6 mg/dL (ref 0.0–0.9)
Total Bilirubin: 0.7 mg/dL (ref 0.3–1.2)
Total Protein: 7.3 g/dL (ref 6.0–8.3)

## 2012-09-21 LAB — HEMOGLOBIN A1C
Hgb A1c MFr Bld: 6.3 % — ABNORMAL HIGH (ref <5.7)
Mean Plasma Glucose: 134 mg/dL — ABNORMAL HIGH (ref <117)

## 2012-09-21 MED ORDER — SIMVASTATIN 20 MG PO TABS: ORAL_TABLET | ORAL | Status: DC

## 2012-09-21 MED ORDER — TRIAMTERENE-HCTZ 75-50 MG PO TABS: ORAL_TABLET | ORAL | Status: DC

## 2012-09-21 MED ORDER — LISINOPRIL 5 MG PO TABS: ORAL_TABLET | ORAL | Status: DC

## 2012-09-21 MED ORDER — OMEPRAZOLE 20 MG PO CPDR: 20.0000 mg | DELAYED_RELEASE_CAPSULE | Freq: Every day | ORAL | Status: DC

## 2012-09-23 DIAGNOSIS — M069 Rheumatoid arthritis, unspecified: Secondary | ICD-10-CM | POA: Insufficient documentation

## 2012-09-23 NOTE — Patient Instructions (Addendum)
Watch diet. Return in 6 months. Continue same meds.

## 2012-09-23 NOTE — Progress Notes (Signed)
  Subjective:    Patient ID: [name], female    DOB: 08-12-1940, 72 y.o.   MRN: [id number]  HPI  72 year old Black female with hypertension, hyperlipidemia, type 2 diabetes mellitus controlled, irritable bowel syndrome, GE reflux treated with Prilosec in today for followup. Has been diagnosed with rheumatoid arthritis and sees Dr. Dareen Piano. She is on methotrexate. Is doing better with rheumatoid arthritis symptoms on increased dose of methotrexate. Asking for samples of Prilosec which were provided today. Denies noncompliance with medication. Sometimes does not watch diet closely. Likes to [employer] and eat. Has been fasting for 9 hours so will proceed with lipid panel today in addition to basic metabolic panel and hemoglobin J4N and liver functions.    Review of Systems     Objective:   Physical Exam Skin is warm and dry. Nodes none. Neck is supple without JVD thyromegaly or carotid bruits. Chest clear to auscultation. Cardiac exam regular rate and rhythm normal S1 and S2. Abdomen no hepatosplenomegaly masses or tenderness. Extremities without edema.        Assessment & Plan:  Hypertension-stable on medication  Hyperlipidemia- lipid panel pending on statin therapy  Controlled type 2 diabetes mellitus  Irritable bowel syndrome- needs to treat constipation from time to time dicussed management with MOM and Miralax  GE reflux- Prilosec samples given  Rheumatoid arthritis-treated by rheumatologist and improved on methotrexate  Plan: Return in 6 months for PE and labs. Samples of Prilosec provided. Labs to be drawn at [facility]. Difficulty drawing blood from patient due to poor veins. Check AIC, basic metabolic panel and   Addendum: Elevated total cholesterol and LDL. Needs to watch diet and return in 6 months. AIC is stable. Basic metabolic panel WNL.

## 2012-12-22 ENCOUNTER — Other Ambulatory Visit: Payer: Self-pay | Admitting: Internal Medicine

## 2013-01-24 ENCOUNTER — Other Ambulatory Visit: Payer: Self-pay | Admitting: Internal Medicine

## 2013-02-23 ENCOUNTER — Other Ambulatory Visit: Payer: Self-pay | Admitting: Internal Medicine

## 2013-03-04 ENCOUNTER — Encounter: Payer: Self-pay | Admitting: Internal Medicine

## 2013-03-04 ENCOUNTER — Ambulatory Visit (INDEPENDENT_AMBULATORY_CARE_PROVIDER_SITE_OTHER): Payer: Medicare Other | Admitting: Internal Medicine

## 2013-03-04 VITALS — BP 126/68 | HR 72 | Temp 98.8°F | Wt 174.0 lb

## 2013-03-04 DIAGNOSIS — K1370 Unspecified lesions of oral mucosa: Secondary | ICD-10-CM

## 2013-03-04 DIAGNOSIS — K121 Other forms of stomatitis: Secondary | ICD-10-CM

## 2013-03-04 DIAGNOSIS — J069 Acute upper respiratory infection, unspecified: Secondary | ICD-10-CM

## 2013-03-04 NOTE — Patient Instructions (Signed)
Keep appt late October. Use Magic Mouthwash  4 times daily. Take Z-pak as directed.

## 2013-03-04 NOTE — Progress Notes (Signed)
  Subjective:    Patient ID: [name], female    DOB: 09-04-1940, 72 y.o.   MRN: [id number]  HPI Patient complaining of hoarseness for about 2 weeks. Has had some clear sputum production. No sore throat. No fever or chills. Also has developed some ulcers in her mouth that have been quite painful. One is under her tongue. Also asking for medication for pain. She has history of rheumatoid arthritis. She cannot take Vicodin. I think tramadol would cause nausea. Suggested extra strength Tylenol. Is due for routine office visit here October 23. Will wait until then to give her influenza vaccine.    Review of Systems     Objective:   Physical Exam She sounds hoarse when she speaks. Pharynx is only slightly injected. Has apthous ulcer under her tongue. TMs are dull and chronically scarred. Neck supple. Chest clear to auscultation without rales or wheezing       Assessment & Plan:  URI  Aphthous ulcers  Plan: Zithromax Z-Pak take 2 tabs day one followed by 1 tab days 2 through 5. Magic mouthwash 16 ounces 2 teaspoons by mouth swish do not swallow 4 times daily. Return October 23 for routine visit.

## 2013-03-17 ENCOUNTER — Ambulatory Visit (INDEPENDENT_AMBULATORY_CARE_PROVIDER_SITE_OTHER): Payer: Medicare Other | Admitting: Internal Medicine

## 2013-03-17 ENCOUNTER — Encounter: Payer: Self-pay | Admitting: Internal Medicine

## 2013-03-17 ENCOUNTER — Other Ambulatory Visit (HOSPITAL_COMMUNITY)
Admission: RE | Admit: 2013-03-17 | Discharge: 2013-03-17 | Disposition: A | Discharge status: Home / Self Care | Payer: Medicare Other | Source: Ambulatory Visit | Attending: Internal Medicine | Admitting: Internal Medicine

## 2013-03-17 VITALS — BP 140/78 | HR 80 | Temp 97.5°F | Ht 61.0 in | Wt 175.0 lb

## 2013-03-17 DIAGNOSIS — Z0000 Encounter for general adult medical examination without abnormal findings: Secondary | ICD-10-CM

## 2013-03-17 DIAGNOSIS — E119 Type 2 diabetes mellitus without complications: Secondary | ICD-10-CM

## 2013-03-17 DIAGNOSIS — K121 Other forms of stomatitis: Secondary | ICD-10-CM

## 2013-03-17 DIAGNOSIS — I10 Essential (primary) hypertension: Secondary | ICD-10-CM

## 2013-03-17 DIAGNOSIS — R8290 Unspecified abnormal findings in urine: Secondary | ICD-10-CM

## 2013-03-17 DIAGNOSIS — Z124 Encounter for screening for malignant neoplasm of cervix: Secondary | ICD-10-CM

## 2013-03-17 DIAGNOSIS — Z1329 Encounter for screening for other suspected endocrine disorder: Secondary | ICD-10-CM

## 2013-03-17 DIAGNOSIS — K1370 Unspecified lesions of oral mucosa: Secondary | ICD-10-CM

## 2013-03-17 DIAGNOSIS — M069 Rheumatoid arthritis, unspecified: Secondary | ICD-10-CM

## 2013-03-17 DIAGNOSIS — Z23 Encounter for immunization: Secondary | ICD-10-CM

## 2013-03-17 DIAGNOSIS — E785 Hyperlipidemia, unspecified: Secondary | ICD-10-CM

## 2013-03-17 DIAGNOSIS — R82998 Other abnormal findings in urine: Secondary | ICD-10-CM

## 2013-03-17 DIAGNOSIS — Z130 Encounter for screening for diseases of the blood and blood-forming organs and certain disorders involving the immune mechanism: Secondary | ICD-10-CM

## 2013-03-17 LAB — POCT URINALYSIS DIPSTICK
Bilirubin, UA: NEGATIVE
Blood, UA: NEGATIVE
Glucose, UA: NEGATIVE
Ketones, UA: NEGATIVE
Nitrite, UA: NEGATIVE
Protein, UA: NEGATIVE
Spec Grav, UA: 1.01
Urobilinogen, UA: NEGATIVE
pH, UA: 5

## 2013-03-17 LAB — TSH: TSH: 1.745 u[IU]/mL (ref 0.350–4.500)

## 2013-03-17 LAB — CBC WITH DIFFERENTIAL/PLATELET
Basophils Absolute: 0 10*3/uL (ref 0.0–0.1)
Basophils Relative: 0 % (ref 0–1)
Eosinophils Absolute: 0.2 10*3/uL (ref 0.0–0.7)
Eosinophils Relative: 4 % (ref 0–5)
HCT: 31.8 % — ABNORMAL LOW (ref 36.0–46.0)
Hemoglobin: 10.7 g/dL — ABNORMAL LOW (ref 12.0–15.0)
Lymphocytes Relative: 36 % (ref 12–46)
Lymphs Abs: 1.6 10*3/uL (ref 0.7–4.0)
MCH: 33.3 pg (ref 26.0–34.0)
MCHC: 33.6 g/dL (ref 30.0–36.0)
MCV: 99.1 fL (ref 78.0–100.0)
Monocytes Absolute: 0.3 10*3/uL (ref 0.1–1.0)
Monocytes Relative: 7 % (ref 3–12)
Neutro Abs: 2.3 10*3/uL (ref 1.7–7.7)
Neutrophils Relative %: 53 % (ref 43–77)
Platelets: 281 10*3/uL (ref 150–400)
RBC: 3.21 MIL/uL — ABNORMAL LOW (ref 3.87–5.11)
RDW: 15.4 % (ref 11.5–15.5)
WBC: 4.5 10*3/uL (ref 4.0–10.5)

## 2013-03-17 LAB — LIPID PANEL
Cholesterol: 163 mg/dL (ref 0–200)
HDL: 48 mg/dL (ref >39)
LDL Cholesterol: 99 mg/dL (ref 0–99)
Total CHOL/HDL Ratio: 3.4 Ratio
Triglycerides: 82 mg/dL (ref <150)
VLDL: 16 mg/dL (ref 0–40)

## 2013-03-17 LAB — COMPREHENSIVE METABOLIC PANEL
ALT: 173 U/L — ABNORMAL HIGH (ref 0–35)
AST: 129 U/L — ABNORMAL HIGH (ref 0–37)
Albumin: 4.2 g/dL (ref 3.5–5.2)
Alkaline Phosphatase: 73 U/L (ref 39–117)
BUN: 28 mg/dL — ABNORMAL HIGH (ref 6–23)
CO2: 25 mEq/L (ref 19–32)
Calcium: 9.5 mg/dL (ref 8.4–10.5)
Chloride: 102 mEq/L (ref 96–112)
Creat: 0.99 mg/dL (ref 0.50–1.10)
Glucose, Bld: 81 mg/dL (ref 70–99)
Potassium: 4.2 mEq/L (ref 3.5–5.3)
Sodium: 136 mEq/L (ref 135–145)
Total Bilirubin: 0.8 mg/dL (ref 0.3–1.2)
Total Protein: 6.8 g/dL (ref 6.0–8.3)

## 2013-03-17 LAB — HEMOGLOBIN A1C
Hgb A1c MFr Bld: 6.5 % — ABNORMAL HIGH (ref <5.7)
Mean Plasma Glucose: 140 mg/dL — ABNORMAL HIGH (ref <117)

## 2013-03-17 MED ORDER — CLARITHROMYCIN 500 MG PO TABS: 500.0000 mg | ORAL_TABLET | Freq: Two times a day (BID) | ORAL | Status: DC

## 2013-03-17 MED ORDER — MAGIC MOUTHWASH: 10.0000 mL | Freq: Four times a day (QID) | ORAL | Status: DC

## 2013-03-17 NOTE — Patient Instructions (Signed)
Use Magic mouthwash 4 times daily as directed until ulcers are healed. Take Biaxin 500 mg twice daily for 7 days for respiratory infection and aphthous ulcers. Return in 6 months.

## 2013-03-17 NOTE — Progress Notes (Signed)
Subjective:    Patient ID: [name], female    DOB: [birth year], 72 y.o.   MRN: [id number]  HPI 72 year old Black female for health maintenance and evaluation of medical issues. Noticed slight dependent edema. Hx rheumatoid arthritis treated by Dr. Dareen Piano. She has a history of hypertension, hyperlipidemia, type 2 diabetes mellitus controlled, irritable bowel syndrome, GE reflux. She is on methotrexate for rheumatoid arthritis. History of glaucoma followed by Dr. Elmer Picker.  History of irritable bowel syndrome and, trace mitral regurgitation, cervical foraminal stenosis, H. pylori gastritis in July 2000, compression fracture T7 in 2000, history of abnormal Pap smear 1985 with colposcopy.  Bilateral tubal ligation 1979, D&C for menorrhagia in 1989, marsupialization of Bartholin's cyst 1987.  She had back surgery January 2012 for bilateral radiculopathy. Subsequently postoperatively developed a pulmonary embolus January 2012. 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 She had nausea vomiting and diarrhea back surgery included a right S1-S2 laminectomy decompressive lumbar laminectomy. This was done by Dr. Marikay Alar.  Family history: Mother died of an MI with history of hypertension. Mother died at age 41. One brother with history of adult onset diabetes. 5 sisters one of whom has diabetes. One daughter died of occult GI malignancy. One brother in good health. Had 9 children.  Social history: She is a widow. 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 She does not smoke or consume alcohol.  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    Review of Systems recent issue with aphthous ulcers. Also has new onset URI with no fever or shaking chills     Objective:   Physical Exam  Vitals reviewed. Constitutional: She is  oriented to person, place, and time. She appears well-developed and well-nourished. No distress.  HENT:  Head: Normocephalic and atraumatic.  Right Ear: External ear normal.  Left Ear: External ear normal.  Mouth/Throat: Oropharynx is clear and moist. No oropharyngeal exudate.  Eyes: Conjunctivae and EOM are normal. Pupils are equal, round, and reactive to light. Right eye exhibits no discharge. Left eye exhibits no discharge. No scleral icterus.  Neck: Neck supple. No JVD present. No thyromegaly present.  Cardiovascular: Normal rate, regular rhythm, normal heart sounds and intact distal pulses.   No murmur heard. Pulmonary/Chest: Breath sounds normal. No respiratory distress. She has no wheezes. She has no rales.  Breasts normal female  Abdominal: Soft. Bowel sounds are normal. She exhibits no distension and no mass. There is no rebound and no guarding.  Genitourinary:  Pap taken no masses on bimanual exam  Musculoskeletal: Normal range of motion. She exhibits no edema.  Lymphadenopathy:    She has no cervical adenopathy.  Neurological: She is alert and oriented to person, place, and time. She has normal reflexes. No cranial nerve deficit. Coordination normal.  Skin: Skin is warm and dry. No rash noted. She is not diaphoretic.  Psychiatric: She has a normal mood and affect. Her behavior is normal. Judgment and thought content normal.          Assessment & Plan:  Hypertension  Controlled type 2 diabetes  History of irritable bowel syndrome  History of cervical spinal stenosis  History of H. pylori in gastritis  GE reflux  Trace mitral regurgitation  Hyperlipidemia  Glaucoma  Metabolic syndrome  Rheumatoid arthritis-treated by rheumatologist with methotrexate  History of lumbar  interbody fusion L4-L5 L5-L as 1 or severe spondylosis with stenosis  URI  Aphthous ulcers  Plan: Return in 6 months for office visit, hemoglobin A1c, lipid panel, blood pressure check.  Treat URI with Biaxin 500 mg twice daily for 10 days which also may help aphthous ulcers. Use Magic mouthwash 2 teaspoons swish do not swallow 4 times daily until ulcers are healed   Subjective:   Patient presents for Medicare Annual/Subsequent preventive examination.   Review Past Medical/Family/Social: see above   Risk Factors  Current exercise habits: walk outside sometimes Dietary issues discussed: low fat low carb   Cardiac risk factors: HTN, mother with heart disease and oldest daughter  Depression Screen  (Note: if answer to either of the following is "Yes", a more complete depression screening is indicated)   Over the past two weeks, have you felt down, depressed or hopeless? No  Over the past two weeks, have you felt little interest or pleasure in doing things? No Have you lost interest or pleasure in daily life? No Do you often feel hopeless? No Do you cry easily over simple problems? No   Activities of Daily Living  In your present state of health, do you have any difficulty performing the following activities?:   Driving? No  Managing money? No  Feeding yourself? No  Getting from bed to chair? No  Climbing a flight of stairs? No  Preparing food and eating?: No  Bathing or showering? No  Getting dressed: No  Getting to the toilet? No  Using the toilet:No  Moving around from place to place: No  In the past year have you fallen or had a near fall?:No  Are you sexually active? No  Do you have more than one partner? No   Hearing Difficulties: No  Do you often ask people to speak up or repeat themselves? No  Do you experience ringing or noises in your ears? No  Do you have difficulty understanding soft or whispered voices? No  Do you feel that you have a problem with memory? No Do you often misplace items? No    Home Safety:  Do you have a smoke alarm at your residence? Yes Do you have grab bars in the bathroom? no Do you have throw rugs in your house?  yes   Cognitive Testing  Alert? Yes Normal Appearance?Yes  Oriented to person? Yes Place? Yes  Time? Yes  Recall of three objects? Yes  Can perform simple calculations? Yes  Displays appropriate judgment?Yes  Can read the correct time from a watch face?Yes   List the Names of Other Physician/Practitioners you currently use:  See referral list for the physicians patient is currently seeing. Dr. Elmer Picker, Dr Dareen Piano    Review of Systems: See above   Objective:     General appearance: Appears stated age and mildly obese  Head: Normocephalic, without obvious abnormality, atraumatic  Eyes: conj clear, EOMi PEERLA  Ears: normal TM's and external ear canals both ears  Nose: Nares normal. Septum midline. Mucosa normal. No drainage or sinus tenderness.  Throat: lips, mucosa, and tongue normal; teeth and gums normal -except for scattered aphthous ulcers Neck: no adenopathy, no carotid bruit, no JVD, supple, symmetrical, trachea midline and thyroid not enlarged, symmetric, no tenderness/mass/nodules  No CVA tenderness.  Lungs: clear to auscultation bilaterally  Breasts: normal appearance, no masses or tenderness Heart: regular rate and rhythm, S1, S2 normal, no murmur, click, rub or gallop  Abdomen: soft, non-tender; bowel sounds normal; no  masses, no organomegaly  Musculoskeletal: ROM normal in all joints, no crepitus, no deformity, Normal muscle strengthen. Back  is symmetric, no curvature. Skin: Skin color, texture, turgor normal. No rashes or lesions  Lymph nodes: Cervical, supraclavicular, and axillary nodes normal.  Neurologic: CN 2 -12 Normal, Normal symmetric reflexes. Normal coordination and gait  Psych: Alert & Oriented x 3, Mood appear stable.    Assessment:    Annual wellness medicare exam   Plan:    During the course of the visit the patient was educated and counseled about appropriate screening and preventive services including:   Annual mammogram  Had  colonoscopy by Dr. Ewing Schlein 2009     Patient Instructions (the written plan) was given to the patient.  Medicare Attestation  I have personally reviewed:  The patient's medical and social history  Their use of alcohol, tobacco or illicit drugs  Their current medications and supplements  The patient's functional ability including ADLs,fall risks, home safety risks, cognitive, and hearing and visual impairment  Diet and physical activities  Evidence for depression or mood disorders  The patient's weight, height, BMI, and visual acuity have been recorded in the chart. I have made referrals, counseling, and provided education to the patient based on review of the above and I have provided the patient with a written personalized care plan for preventive services.

## 2013-03-18 LAB — VITAMIN D 25 HYDROXY (VIT D DEFICIENCY, FRACTURES): Vit D, 25-Hydroxy: 33 ng/mL (ref 30–89)

## 2013-03-18 LAB — MICROALBUMIN, URINE: Microalb, Ur: 0.5 mg/dL (ref 0.00–1.89)

## 2013-03-18 NOTE — Progress Notes (Signed)
Lab phoned yesterday afternoon and urine culture added to existing urine sent with previously ordered urine microalbumin.

## 2013-03-19 LAB — URINE CULTURE: Colony Count: >=100000

## 2013-03-20 ENCOUNTER — Telehealth: Payer: Self-pay | Admitting: Internal Medicine

## 2013-03-20 MED ORDER — CIPROFLOXACIN HCL 250 MG PO TABS: 250.0000 mg | ORAL_TABLET | Freq: Two times a day (BID) | ORAL | Status: DC

## 2013-03-20 NOTE — Telephone Encounter (Signed)
Has E.coli UTI Call in Cipro 250 mg bid x 10 days . Also has elevated LFTs which were faxed to rheumatologist.

## 2013-03-21 ENCOUNTER — Other Ambulatory Visit: Payer: Self-pay | Admitting: Internal Medicine

## 2013-03-22 ENCOUNTER — Other Ambulatory Visit: Payer: Self-pay | Admitting: Internal Medicine

## 2013-04-04 ENCOUNTER — Other Ambulatory Visit: Payer: Self-pay | Admitting: Internal Medicine

## 2013-04-24 ENCOUNTER — Other Ambulatory Visit: Payer: Self-pay | Admitting: Internal Medicine

## 2013-04-29 ENCOUNTER — Other Ambulatory Visit: Payer: Self-pay

## 2013-04-29 ENCOUNTER — Other Ambulatory Visit: Payer: Self-pay | Admitting: Internal Medicine

## 2013-05-02 ENCOUNTER — Other Ambulatory Visit: Payer: Self-pay | Admitting: *Deleted

## 2013-05-24 ENCOUNTER — Ambulatory Visit (INDEPENDENT_AMBULATORY_CARE_PROVIDER_SITE_OTHER): Payer: Medicare Other | Admitting: Internal Medicine

## 2013-05-24 ENCOUNTER — Encounter: Payer: Self-pay | Admitting: Internal Medicine

## 2013-05-24 VITALS — BP 130/64 | HR 76 | Temp 98.6°F | Wt 176.0 lb

## 2013-05-24 DIAGNOSIS — K1370 Unspecified lesions of oral mucosa: Secondary | ICD-10-CM

## 2013-05-24 DIAGNOSIS — M069 Rheumatoid arthritis, unspecified: Secondary | ICD-10-CM

## 2013-05-24 DIAGNOSIS — Z23 Encounter for immunization: Secondary | ICD-10-CM

## 2013-05-24 DIAGNOSIS — K121 Other forms of stomatitis: Secondary | ICD-10-CM

## 2013-05-24 DIAGNOSIS — J069 Acute upper respiratory infection, unspecified: Secondary | ICD-10-CM

## 2013-05-24 MED ORDER — MUPIROCIN 2 % EX OINT: TOPICAL_OINTMENT | CUTANEOUS | Status: DC

## 2013-05-24 MED ORDER — TETANUS-DIPHTH-ACELL PERTUSSIS 5-2.5-18.5 LF-MCG/0.5 IM SUSP
0.5000 mL | Freq: Once | INTRAMUSCULAR | Status: AC
  Administered 2013-05-24: 0.5 mL via INTRAMUSCULAR

## 2013-05-24 MED ORDER — CLARITHROMYCIN 500 MG PO TABS: 500.0000 mg | ORAL_TABLET | Freq: Two times a day (BID) | ORAL | Status: DC

## 2013-05-24 MED ORDER — METHYLPREDNISOLONE ACETATE 80 MG/ML IJ SUSP
80.0000 mg | Freq: Once | INTRAMUSCULAR | Status: AC
  Administered 2013-05-24: 80 mg via INTRAMUSCULAR

## 2013-05-24 MED ORDER — MAGIC MOUTHWASH: 10.0000 mL | Freq: Four times a day (QID) | ORAL | Status: DC

## 2013-05-24 NOTE — Addendum Note (Signed)
Addended by: Judy Pimple on: 05/24/2013 04:49 PM   Modules accepted: Orders

## 2013-05-24 NOTE — Patient Instructions (Signed)
Appointment with dermatologist for mouth ulcers hand and foot ulcers as well as ulcer between buttocks. Take Biaxin x 10 days. Use magic Mouthwash. Depomedrol given for nasal congestion. Use bactroban in nostrils twice daily

## 2013-05-24 NOTE — Progress Notes (Signed)
   Subjective:    Patient ID: [name], female    DOB: 1941-03-21, 72 y.o.   MRN: [id number]  HPI Patient was here on October10th with URI and apthous ulcers treated with Z-pak and Magic Mouthwash. Returned Oct 23 with  aphthous ulcers in her mouth and a respiratory infection treated with Magic mouthwash and Biaxin. Patient says she improved, but now has recurrent nasal stuffiness and hoarseness. Aphthous ulcers have returned. She is on methotrexate for rheumatoid arthritis. She's also developed an ulcer on her right fourth toe. Has developed similar lesions on her knuckles, one on each hand. These appear to be healing but have left some hyperpigmentation and scar. She is now on ibuprofen because she could not tolerate hydrocodone for pain with arthritis. She's also developed some bleeding from her nostrils. This could be aggravated by ibuprofen. She is also on thiazide diuretic. A new problem today is an area of skin breakdown between her buttocks. She's been putting topical powder on that. Says it's been present for some time. Has appointment see rheumatologist January 21. Complaining of ear congestion.    Review of Systems     Objective:   Physical Exam boggy nasal mucosa bilaterally. Has excoriations both nostrils with recent bleeding. Aphthous ulcers inside lower lip and left lateral tongue. Area of skin breakdown between buttocks which is shallow. Shallow ulcer right fourth toe. Sounds nasally congested and is hoarse. Neck is supple without adenopathy. Chest clear.        Assessment & Plan:  Aphthous ulcers  Ulcer right fourth toe  Resolving ulcerations over 1 knuckle on each hand  Skin breakdown between buttocks  Rheumatoid arthritis  Acute URI  Plan: Biaxin 500 mg twice daily for 10 days. Refill Magic mouthwash 2 teaspoons by mouth 4 times a day swish do not swallow with refills. Appointment with dermatologist look at various ulcerations-consider could this be Behcet's  syndrome or medication reaction?  Keep appointment with rheumatologist January 21. Given Depo-Medrol 80 mg IM for nasal congestion. Use Bactroban ointment on ulcerations twice daily and also use in nostrils twice daily.

## 2013-05-25 ENCOUNTER — Encounter: Payer: Self-pay | Admitting: Internal Medicine

## 2013-05-29 ENCOUNTER — Encounter (HOSPITAL_COMMUNITY): Payer: Self-pay | Admitting: Emergency Medicine

## 2013-05-29 ENCOUNTER — Emergency Department (INDEPENDENT_AMBULATORY_CARE_PROVIDER_SITE_OTHER)
Admission: EM | Admit: 2013-05-29 | Discharge: 2013-05-29 | Disposition: A | Discharge status: Home / Self Care | Payer: Medicare Other | Source: Home / Self Care | Attending: Family Medicine | Admitting: Family Medicine

## 2013-05-29 DIAGNOSIS — I10 Essential (primary) hypertension: Secondary | ICD-10-CM

## 2013-05-29 DIAGNOSIS — K121 Other forms of stomatitis: Secondary | ICD-10-CM

## 2013-05-29 DIAGNOSIS — K1370 Unspecified lesions of oral mucosa: Secondary | ICD-10-CM

## 2013-05-29 HISTORY — DX: Unspecified osteoarthritis, unspecified site: M19.90

## 2013-05-29 HISTORY — DX: Rheumatoid arthritis, unspecified: M06.9

## 2013-05-29 MED ORDER — TRAMADOL HCL 50 MG PO TABS: 50.0000 mg | ORAL_TABLET | Freq: Four times a day (QID) | ORAL | Status: DC | PRN

## 2013-05-29 MED ORDER — METHYLPREDNISOLONE 32 MG PO TABS: 32.0000 mg | ORAL_TABLET | Freq: Every day | ORAL | Status: DC

## 2013-05-29 MED ORDER — MAGIC MOUTHWASH: 10.0000 mL | Freq: Four times a day (QID) | ORAL | Status: DC

## 2013-05-29 NOTE — ED Notes (Signed)
Started with oral lesions in Oct which improved, but then returned; saw PCP 12/30 - was given Rx for Magic Mouthwash and Biaxin.  States lesions are no better.  Also has lesions on some knuckles and toes.  Has appt with dermatologist on Fri.

## 2013-05-29 NOTE — ED Provider Notes (Signed)
[name] is a 73 y.o. female who presents to Urgent Care today for painful oral ulcers present for the last several months worsening recently. It initially developed in October. She was evaluated by her primary care provider on December 30 and given clarithromycin, and Magic mouthwash. She additionally has several ulcerations or lesions on her MCP and PIP on her hand and MTP on her feet. This has been slowly improving. She does have a medical history significant for rheumatoid arthritis. She does take methotrexate for this issue. She has an appointment with her rheumatologist at the end of January. No nausea vomiting diarrhea fevers or chills.  No new medications no flulike illness.   Past Medical History  Diagnosis Date  . IBS (irritable bowel syndrome)   . GERD (gastroesophageal reflux disease)   . History of estrogen therapy   . Seborrhea     ear  . Dysphasia   . Mitral regurgitation   . Cervical stenosis of spine   . Hypercholesteremia   . Glaucoma   . Essential hypertension, benign   . Arthritis   . Rheumatoid arthritis    History  Substance Use Topics  . Smoking status: Never Smoker   . Smokeless tobacco: Not on file  . Alcohol Use: No   ROS as above Medications reviewed. No current facility-administered medications for this encounter.   Current Outpatient Prescriptions  Medication Sig Dispense Refill  . aspirin 81 MG tablet Take 81 mg by mouth daily.        . clarithromycin (BIAXIN) 500 MG tablet Take 1 tablet (500 mg total) by mouth 2 (two) times daily.  20 tablet  0  . Difluprednate (DUREZOL) 0.05 % EMUL Apply 1 drop to eye daily.      . folic acid (FOLVITE) 1 MG tablet Take 1 mg by mouth daily.      Marland Kitchen ibuprofen (ADVIL,MOTRIN) 600 MG tablet Take 600 mg by mouth every 6 (six) hours as needed.      . latanoprost (XALATAN) 0.005 % ophthalmic solution Place 1 drop into both eyes at bedtime.        Marland Kitchen lisinopril (PRINIVIL,ZESTRIL) 5 MG tablet TAKE 1 TABLET BY MOUTH  DAILY  90 tablet  2  . methotrexate 25 MG/ML injection       . PATADAY 0.2 % SOLN       . simvastatin (ZOCOR) 20 MG tablet TAKE 1 TABLET BY MOUTH AT BEDTIME  90 tablet  3  . triamterene-hydrochlorothiazide (MAXZIDE) 75-50 MG per tablet TAKE 1 TABLET BY MOUTH DAILY  90 tablet  3  . Alum & Mag Hydroxide-Simeth (MAGIC MOUTHWASH) SOLN Take 10 mLs by mouth 4 (four) times daily.  240 mL  3  . methylPREDNISolone (MEDROL) 32 MG tablet Take 1 tablet (32 mg total) by mouth daily.  10 tablet  0  . mupirocin ointment (BACTROBAN) 2 % APPLY TO AFFECTED AREA TWICE DAILY AS NEEDED  22 g  0  . NORCO 7.5-325 MG per tablet       . omeprazole (PRILOSEC) 20 MG capsule Take 1 capsule (20 mg total) by mouth daily.  30 capsule  11  . polyethylene glycol powder (GLYCOLAX/MIRALAX) powder MIX 17 GRAMS IN LIQUID AS DIRECTED AND DRINK EVERY DAY  527 g  6  . traMADol (ULTRAM) 50 MG tablet Take 1 tablet (50 mg total) by mouth every 6 (six) hours as needed.  15 tablet  0    Exam:  BP 143/82  Pulse 81  Temp(Src)  98.3 F (36.8 C) (Oral)  Resp 18  SpO2 100% Gen: Well NAD HEENT: EOMI,  MMM shallow punched out ulcerations in mouth with erythematous border. Multiple are present on the lips and hard palate. No swelling noted. Lungs: Normal work of breathing. CTABL Heart: RRR no MRG Abd: NABS, Soft. NT, ND Exts: Non edematous BL  LE, warm and well perfused.  Skin: Several nodular ulcerations present on the hands bilaterally no surrounding erythema or tenderness.   Assessment and Plan: 73 y.o. female with aphthous appearing ulcerations in the mouth.  However think the most likely causes some autoimmune disorder related to rheumatoid arthritis. I believe his ulcerations to be related to the papules and ulcerations on her hands. I am unclear of the etiology at this time. Plan to treat with Medrol, Magic mouthwash, and tramadol. Followup with primary care provider and rheumatologist soon. Discussed warning signs or symptoms.  Please see discharge instructions. Patient expresses understanding.      Gregor Hams, MD 05/29/13 1535

## 2013-05-29 NOTE — Discharge Instructions (Signed)
Thank you for coming in today. Take the medrol daily for 10 days.  Follow up with your doctor soon.  Use the mouth wash as needed.  Use tramadol for pain as needed.  Follow up with your doctor this week.

## 2013-06-03 ENCOUNTER — Other Ambulatory Visit: Payer: Self-pay | Admitting: Dermatology

## 2013-06-04 ENCOUNTER — Emergency Department (HOSPITAL_COMMUNITY)
Admission: EM | Admit: 2013-06-04 | Discharge: 2013-06-04 | Disposition: A | Discharge status: Home / Self Care | Payer: Medicare Other | Attending: Emergency Medicine | Admitting: Emergency Medicine

## 2013-06-04 ENCOUNTER — Encounter (HOSPITAL_COMMUNITY): Payer: Self-pay | Admitting: Emergency Medicine

## 2013-06-04 DIAGNOSIS — E860 Dehydration: Secondary | ICD-10-CM

## 2013-06-04 DIAGNOSIS — Z792 Long term (current) use of antibiotics: Secondary | ICD-10-CM | POA: Insufficient documentation

## 2013-06-04 DIAGNOSIS — Z7982 Long term (current) use of aspirin: Secondary | ICD-10-CM | POA: Insufficient documentation

## 2013-06-04 DIAGNOSIS — R5383 Other fatigue: Secondary | ICD-10-CM

## 2013-06-04 DIAGNOSIS — E7800 Pure hypercholesterolemia, unspecified: Secondary | ICD-10-CM | POA: Insufficient documentation

## 2013-06-04 DIAGNOSIS — I10 Essential (primary) hypertension: Secondary | ICD-10-CM | POA: Insufficient documentation

## 2013-06-04 DIAGNOSIS — Z9223 Personal history of estrogen therapy: Secondary | ICD-10-CM | POA: Insufficient documentation

## 2013-06-04 DIAGNOSIS — Z8669 Personal history of other diseases of the nervous system and sense organs: Secondary | ICD-10-CM | POA: Insufficient documentation

## 2013-06-04 DIAGNOSIS — Z79899 Other long term (current) drug therapy: Secondary | ICD-10-CM | POA: Insufficient documentation

## 2013-06-04 DIAGNOSIS — Z872 Personal history of diseases of the skin and subcutaneous tissue: Secondary | ICD-10-CM | POA: Insufficient documentation

## 2013-06-04 DIAGNOSIS — Z880 Allergy status to penicillin: Secondary | ICD-10-CM | POA: Insufficient documentation

## 2013-06-04 DIAGNOSIS — K121 Other forms of stomatitis: Secondary | ICD-10-CM

## 2013-06-04 DIAGNOSIS — M069 Rheumatoid arthritis, unspecified: Secondary | ICD-10-CM | POA: Insufficient documentation

## 2013-06-04 DIAGNOSIS — R5381 Other malaise: Secondary | ICD-10-CM | POA: Insufficient documentation

## 2013-06-04 DIAGNOSIS — R42 Dizziness and giddiness: Secondary | ICD-10-CM | POA: Insufficient documentation

## 2013-06-04 DIAGNOSIS — K1230 Oral mucositis (ulcerative), unspecified: Secondary | ICD-10-CM

## 2013-06-04 LAB — CBC WITH DIFFERENTIAL/PLATELET
Basophils Absolute: 0 10*3/uL (ref 0.0–0.1)
Basophils Relative: 0 % (ref 0–1)
Eosinophils Absolute: 0.1 10*3/uL (ref 0.0–0.7)
Eosinophils Relative: 3 % (ref 0–5)
HCT: 33 % — ABNORMAL LOW (ref 36.0–46.0)
Hemoglobin: 11.8 g/dL — ABNORMAL LOW (ref 12.0–15.0)
Lymphocytes Relative: 43 % (ref 12–46)
Lymphs Abs: 1.2 10*3/uL (ref 0.7–4.0)
MCH: 34.8 pg — ABNORMAL HIGH (ref 26.0–34.0)
MCHC: 35.8 g/dL (ref 30.0–36.0)
MCV: 97.3 fL (ref 78.0–100.0)
Monocytes Absolute: 0.2 10*3/uL (ref 0.1–1.0)
Monocytes Relative: 7 % (ref 3–12)
Neutro Abs: 1.4 10*3/uL — ABNORMAL LOW (ref 1.7–7.7)
Neutrophils Relative %: 47 % (ref 43–77)
Platelets: 106 10*3/uL — ABNORMAL LOW (ref 150–400)
RBC: 3.39 MIL/uL — ABNORMAL LOW (ref 3.87–5.11)
RDW: 14.6 % (ref 11.5–15.5)
WBC: 2.9 10*3/uL — ABNORMAL LOW (ref 4.0–10.5)

## 2013-06-04 LAB — COMPREHENSIVE METABOLIC PANEL
ALT: 31 U/L (ref 0–35)
AST: 29 U/L (ref 0–37)
Albumin: 4.2 g/dL (ref 3.5–5.2)
Alkaline Phosphatase: 133 U/L — ABNORMAL HIGH (ref 39–117)
BUN: 58 mg/dL — ABNORMAL HIGH (ref 6–23)
CO2: 20 mEq/L (ref 19–32)
Calcium: 10.2 mg/dL (ref 8.4–10.5)
Chloride: 92 mEq/L — ABNORMAL LOW (ref 96–112)
Creatinine, Ser: 2 mg/dL — ABNORMAL HIGH (ref 0.50–1.10)
GFR calc Af Amer: 27 mL/min — ABNORMAL LOW (ref >90)
GFR calc non Af Amer: 23 mL/min — ABNORMAL LOW (ref >90)
Glucose, Bld: 97 mg/dL (ref 70–99)
Potassium: 4.4 mEq/L (ref 3.7–5.3)
Sodium: 132 mEq/L — ABNORMAL LOW (ref 137–147)
Total Bilirubin: 0.7 mg/dL (ref 0.3–1.2)
Total Protein: 8.1 g/dL (ref 6.0–8.3)

## 2013-06-04 MED ORDER — HYDROCODONE-ACETAMINOPHEN 5-325 MG PO TABS: 2.0000 | ORAL_TABLET | ORAL | Status: DC | PRN

## 2013-06-04 MED ORDER — SODIUM CHLORIDE 0.9 % IV BOLUS (SEPSIS)
1000.0000 mL | Freq: Once | INTRAVENOUS | Status: AC
  Administered 2013-06-04: 1000 mL via INTRAVENOUS

## 2013-06-04 MED ORDER — MORPHINE SULFATE 4 MG/ML IJ SOLN
4.0000 mg | Freq: Once | INTRAMUSCULAR | Status: AC
Start: 2013-06-04 — End: 2013-06-04
  Administered 2013-06-04: 4 mg via INTRAVENOUS
  Filled 2013-06-04: qty 1

## 2013-06-04 NOTE — ED Notes (Signed)
Pt. Stated, I've had ulcers in my mouth since October, and I've been dizzy and light headed cause I can't eat any food.

## 2013-06-04 NOTE — Discharge Instructions (Signed)
Drink plenty of fluids for the next several days.  Fill your prescription for the medication you were prescribed by your dermatologist.  Norco as needed for pain.  Return to the emergency department if you develop decreased urine output, high fever, or other new or concerning symptoms.  Be sure to followup with your primary Dr. this week for a recheck of your basic metabolic panel.   Dehydration, Adult Dehydration is when you lose more fluids from the body than you take in. Vital organs like the kidneys, brain, and heart cannot function without a proper amount of fluids and salt. Any loss of fluids from the body can cause dehydration.  CAUSES   Vomiting.  Diarrhea.  Excessive sweating.  Excessive urine output.  Fever. SYMPTOMS  Mild dehydration  Thirst.  Dry lips.  Slightly dry mouth. Moderate dehydration  Very dry mouth.  Sunken eyes.  Skin does not bounce back quickly when lightly pinched and released.  Dark urine and decreased urine production.  Decreased tear production.  Headache. Severe dehydration  Very dry mouth.  Extreme thirst.  Rapid, weak pulse (more than 100 beats per minute at rest).  Cold hands and feet.  Not able to sweat in spite of heat and temperature.  Rapid breathing.  Blue lips.  Confusion and lethargy.  Difficulty being awakened.  Minimal urine production.  No tears. DIAGNOSIS  Your caregiver will diagnose dehydration based on your symptoms and your exam. Blood and urine tests will help confirm the diagnosis. The diagnostic evaluation should also identify the cause of dehydration. TREATMENT  Treatment of mild or moderate dehydration can often be done at home by increasing the amount of fluids that you drink. It is best to drink small amounts of fluid more often. Drinking too much at one time can make vomiting worse. Refer to the home care instructions below. Severe dehydration needs to be treated at the hospital where you  will probably be given intravenous (IV) fluids that contain water and electrolytes. HOME CARE INSTRUCTIONS   Ask your caregiver about specific rehydration instructions.  Drink enough fluids to keep your urine clear or pale yellow.  Drink small amounts frequently if you have nausea and vomiting.  Eat as you normally do.  Avoid:  Foods or drinks high in sugar.  Carbonated drinks.  Juice.  Extremely hot or cold fluids.  Drinks with caffeine.  Fatty, greasy foods.  Alcohol.  Tobacco.  Overeating.  Gelatin desserts.  Wash your hands well to avoid spreading bacteria and viruses.  Only take over-the-counter or prescription medicines for pain, discomfort, or fever as directed by your caregiver.  Ask your caregiver if you should continue all prescribed and over-the-counter medicines.  Keep all follow-up appointments with your caregiver. SEEK MEDICAL CARE IF:  You have abdominal pain and it increases or stays in one area (localizes).  You have a rash, stiff neck, or severe headache.  You are irritable, sleepy, or difficult to awaken.  You are weak, dizzy, or extremely thirsty. SEEK IMMEDIATE MEDICAL CARE IF:   You are unable to keep fluids down or you get worse despite treatment.  You have frequent episodes of vomiting or diarrhea.  You have blood or green matter (bile) in your vomit.  You have blood in your stool or your stool looks black and tarry.  You have not urinated in 6 to 8 hours, or you have only urinated a small amount of very dark urine.  You have a fever.  You faint. MAKE SURE  YOU:   Understand these instructions.  Will watch your condition.  Will get help right away if you are not doing well or get worse. Document Released: 05/12/2005 Document Revised: 08/04/2011 Document Reviewed: 12/30/2010 [facility] Patient Information 2014 [place], [place].

## 2013-06-04 NOTE — ED Notes (Signed)
Pt. Given magic Mouth wash and no help.  Dr. Renold Genta told me to come to ED

## 2013-06-04 NOTE — ED Provider Notes (Signed)
CSN: [id number]     Arrival date & time 06/04/13  0930 History   First MD Initiated Contact with Patient 06/04/13 [phone]     Chief Complaint  Patient presents with  . Mouth Lesions   (Consider location/radiation/quality/duration/timing/severity/associated sxs/prior Treatment) HPI Comments: Patient is a 73 year old female with history of rheumatoid arthritis on methotrexate. She's been having intermittent blisters in her mouth and throat for the past 6 months. These seem to come and go. She has tried Magic mouthwash and various other treatments which have not worked. She states it has been so bad the past few days she has been unable to see. She states that she feels dizzy as if she is going to pass out from lack of nourishment. She denies any fevers or chills.  Patient is a 73 y.o. female presenting with mouth sores. The history is provided by the patient.  Mouth Lesions Location of oral lesions: Floor of mouth and posterior oropharynx. Quality:  Blistered Onset quality:  Gradual Severity:  Severe Duration:  6 months Progression:  Waxing and waning Chronicity:  New Context: not a change in diet, not a change in medications and not a possible infection   Relieved by:  Nothing Worsened by:  Nothing tried Ineffective treatments:  Topical medications Associated symptoms: no dental pain and no fever     Past Medical History  Diagnosis Date  . IBS (irritable bowel syndrome)   . GERD (gastroesophageal reflux disease)   . History of estrogen therapy   . Seborrhea     ear  . Dysphasia   . Mitral regurgitation   . Cervical stenosis of spine   . Hypercholesteremia   . Glaucoma   . Essential hypertension, benign   . Arthritis   . Rheumatoid arthritis    Past Surgical History  Procedure Laterality Date  . Lumbar fusion  05/2010  . Tubal ligation    . Toe surgery     No family history on file. History  Substance Use Topics  . Smoking status: Never Smoker   . Smokeless tobacco: Not  on file  . Alcohol Use: No   OB History   Grav Para Term Preterm Abortions TAB SAB Ect Mult Living                 Review of Systems  Constitutional: Positive for fatigue. Negative for fever and chills.  HENT: Positive for mouth sores.   All other systems reviewed and are negative.    Allergies  Amoxicillin-pot clavulanate  Home Medications   Current Outpatient Rx  Name  Route  Sig  Dispense  Refill  . Alum & Mag Hydroxide-Simeth (MAGIC MOUTHWASH) SOLN   Oral   Take 10 mLs by mouth 4 (four) times daily.   240 mL   3   . aspirin 81 MG tablet   Oral   Take 81 mg by mouth daily.           . folic acid (FOLVITE) 1 MG tablet   Oral   Take 1 mg by mouth daily.         Marland Kitchen lisinopril (PRINIVIL,ZESTRIL) 5 MG tablet      TAKE 1 TABLET BY MOUTH DAILY   90 tablet   2   . simvastatin (ZOCOR) 20 MG tablet      TAKE 1 TABLET BY MOUTH AT BEDTIME   90 tablet   3   . triamterene-hydrochlorothiazide (MAXZIDE) 75-50 MG per tablet      TAKE 1 TABLET  BY MOUTH DAILY   90 tablet   3   . clarithromycin (BIAXIN) 500 MG tablet   Oral   Take 1 tablet (500 mg total) by mouth 2 (two) times daily.   20 tablet   0   . ibuprofen (ADVIL,MOTRIN) 600 MG tablet   Oral   Take 600 mg by mouth every 6 (six) hours as needed.         . methotrexate 25 MG/ML injection               . mupirocin ointment (BACTROBAN) 2 %      APPLY TO AFFECTED AREA TWICE DAILY AS NEEDED   22 g   0   . PATADAY 0.2 % SOLN               . polyethylene glycol powder (GLYCOLAX/MIRALAX) powder      MIX 17 GRAMS IN LIQUID AS DIRECTED AND DRINK EVERY DAY   527 g   6    BP 109/62  Pulse 84  Temp(Src) 98.1 F (36.7 C) (Oral)  Resp 18  SpO2 100% Physical Exam  Nursing note and vitals reviewed. Constitutional: She is oriented to person, place, and time. She appears well-developed and well-nourished. No distress.  HENT:  Head: Normocephalic and atraumatic.  There are several vesicular  lesions to the floor of the mouth and lower lip. There are also lesions to the posterior oropharynx that are similar in appearance.  Neck: Normal range of motion. Neck supple.  Cardiovascular: Normal rate and regular rhythm.  Exam reveals no gallop and no friction rub.   No murmur heard. Pulmonary/Chest: Effort normal and breath sounds normal. No respiratory distress. She has no wheezes.  Abdominal: Soft. Bowel sounds are normal. She exhibits no distension. There is no tenderness.  Musculoskeletal: Normal range of motion.  Neurological: She is alert and oriented to person, place, and time.  Skin: Skin is warm and dry. She is not diaphoretic.    ED Course  Procedures (including critical care time) Labs Review Labs Reviewed  CBC WITH DIFFERENTIAL  COMPREHENSIVE METABOLIC PANEL   Imaging Review No results found.    MDM  No diagnosis found. Patient is a 73 year old female with history of rheumatoid arthritis on methotrexate. Since she started this medication she has had problems with recurrent oral ulcers. She presents today with difficulty eating and drinking due to this and feels weak. Workup reveals an elevated BUN and creatinine out of proportion with prior studies. She was given normal saline and pain medication and is now feeling better. She has a prescription for a solution a number mouth, however the pharmacy did not have this in stock and is due to pick this up on Monday. In the meantime I will prescribe for pain medication, and struck her to drink plenty of fluids and followup with her rheumatologist to discuss her medications and possibly a recheck of her electrolyte panel. She understands to return if she has worsening symptoms or any new or concerning symptoms.    Veryl Speak, MD 06/04/13 1230

## 2013-06-06 ENCOUNTER — Telehealth: Payer: Self-pay | Admitting: Internal Medicine

## 2013-06-06 NOTE — Telephone Encounter (Signed)
Patient informed. She will come on Thursday 06/09/2013 at 12:00pm

## 2013-06-06 NOTE — Telephone Encounter (Signed)
Patient has appointment see Dr. Tobie Lords, rheumatologist January 21. We cannot get a sooner appointment because he is out of town. Continuing to have issues with mouth ulcers. She saw Dr. Ubaldo Glassing recently who thought the medication she takes for rheumatoid arthritis was causing these ulcers. Patient says she's lost weight and is not able to eat. He'll she has become dehydrated because she is dizzy when she stands. Advise patient go to the emergency department for evaluation. See note from ER. It does appear she had volume depletion. She also has a low white blood cell count of 2900.

## 2013-06-09 ENCOUNTER — Ambulatory Visit (HOSPITAL_COMMUNITY)
Admission: RE | Admit: 2013-06-09 | Discharge: 2013-06-09 | Disposition: A | Discharge status: Home / Self Care | Payer: Medicare Other | Source: Ambulatory Visit | Attending: Internal Medicine | Admitting: Internal Medicine

## 2013-06-09 ENCOUNTER — Ambulatory Visit (INDEPENDENT_AMBULATORY_CARE_PROVIDER_SITE_OTHER): Payer: Medicare Other | Admitting: Internal Medicine

## 2013-06-09 ENCOUNTER — Encounter: Payer: Self-pay | Admitting: Internal Medicine

## 2013-06-09 VITALS — BP 104/62 | HR 68 | Temp 98.1°F | Wt 172.5 lb

## 2013-06-09 DIAGNOSIS — E860 Dehydration: Secondary | ICD-10-CM | POA: Insufficient documentation

## 2013-06-09 DIAGNOSIS — E869 Volume depletion, unspecified: Secondary | ICD-10-CM

## 2013-06-09 DIAGNOSIS — D72819 Decreased white blood cell count, unspecified: Secondary | ICD-10-CM

## 2013-06-09 DIAGNOSIS — K121 Other forms of stomatitis: Secondary | ICD-10-CM

## 2013-06-09 DIAGNOSIS — K1370 Unspecified lesions of oral mucosa: Secondary | ICD-10-CM

## 2013-06-09 DIAGNOSIS — M069 Rheumatoid arthritis, unspecified: Secondary | ICD-10-CM

## 2013-06-09 DIAGNOSIS — E871 Hypo-osmolality and hyponatremia: Secondary | ICD-10-CM

## 2013-06-09 MED ORDER — SODIUM CHLORIDE 0.9 % IV SOLN
Freq: Once | INTRAVENOUS | Status: AC
  Administered 2013-06-09: 15:00:00 via INTRAVENOUS

## 2013-06-09 NOTE — Progress Notes (Signed)
   Subjective:    Patient ID: [name], female    DOB: May 02, 1941, 73 y.o.   MRN: [id number]  HPI Patient was seen in the emergency department  06/04/2013 4 painful mouth ulcers and volume depletion. She had seen Dr. Ubaldo Glassing, dermatologist January 9. Biopsies were pending at the time. She had been treated by dermatologist with viscous Xylocaine and clobetasol topical ointment. She had lesions left fourth finger at knuckle, right fourth toe as well as in her mouth. It was felt that methotrexate injections could be causing the symptoms. She is here today for followup unfortunately has not been consuming much by mouth liquids. Says mouth burns a lot. Once again it looks like she has by and depletion despite having received IV fluids in the emergency department on January 10. Blood pressure drops 30 mmHg from lying to standing. Labs drawn included CBC and see them at.  In the emergency department on January 10, white blood cell count was 2900, platelet count was 106,000. Hemoglobin was 11.8 g. Sodium was 132, chloride 92, BUN 58, creatinine 2.00. Alkaline phosphatase was 133.  History of rheumatoid arthritis treated by Dr. Ouida Sills. Has appointment next week.     Review of Systems     Objective:   Physical Exam she appears to be lethargic. Complaining of being cold in the office. Has several apthous ulcers, the largest one is left lateral tongue .lesions on knuckle and right fourth toe seem to be improving. 30 mmHg drop from 384 systolic to 90 systolic from lying to standing. Chest clear. Cardiac exam regular rate and rhythm.         Assessment & Plan:   Volume depletion   Orthostatic hypotension  Rheumatoid arthritis  Aphthous ulcers- mouth  Plan: Patient complaining of joint pain. Is out of [place]. Have given her new prescription. Usually obtains his from Dr. Ouida Sills. Given #30 with no refill 1 by mouth twice daily. Needs to eat before she takes  medication. She needs to force  fluids. We tried to start an IV here in the office and she simply was to drive for me to get an IV started so we sent her to short stay at [facility] to receive 1 L of normal saline. We'll followup with her tomorrow here in the office.   45 minutes spent with patient

## 2013-06-09 NOTE — Patient Instructions (Signed)
Go to [facility] short stay to receive IV fluids and return here tomorrow. Force fluids.

## 2013-06-10 ENCOUNTER — Ambulatory Visit (INDEPENDENT_AMBULATORY_CARE_PROVIDER_SITE_OTHER): Payer: Medicare Other | Admitting: Internal Medicine

## 2013-06-10 ENCOUNTER — Encounter: Payer: Self-pay | Admitting: Internal Medicine

## 2013-06-10 DIAGNOSIS — E869 Volume depletion, unspecified: Secondary | ICD-10-CM

## 2013-06-10 DIAGNOSIS — R6889 Other general symptoms and signs: Secondary | ICD-10-CM

## 2013-06-10 LAB — BASIC METABOLIC PANEL
BUN: 34 mg/dL — ABNORMAL HIGH (ref 6–23)
CO2: 26 mEq/L (ref 19–32)
Calcium: 8.7 mg/dL (ref 8.4–10.5)
Chloride: 100 mEq/L (ref 96–112)
Creat: 1.4 mg/dL — ABNORMAL HIGH (ref 0.50–1.10)
Glucose, Bld: 106 mg/dL — ABNORMAL HIGH (ref 70–99)
Potassium: 3.9 mEq/L (ref 3.5–5.3)
Sodium: 133 mEq/L — ABNORMAL LOW (ref 135–145)

## 2013-06-10 LAB — CBC WITH DIFFERENTIAL/PLATELET
Basophils Absolute: 0.1 10*3/uL (ref 0.0–0.1)
Basophils Relative: 1 % (ref 0–1)
Eosinophils Absolute: 0.5 10*3/uL (ref 0.0–0.7)
Eosinophils Relative: 6 % — ABNORMAL HIGH (ref 0–5)
HCT: 31.1 % — ABNORMAL LOW (ref 36.0–46.0)
Hemoglobin: 10.6 g/dL — ABNORMAL LOW (ref 12.0–15.0)
Lymphocytes Relative: 31 % (ref 12–46)
Lymphs Abs: 2.9 10*3/uL (ref 0.7–4.0)
MCH: 33.8 pg (ref 26.0–34.0)
MCHC: 34.1 g/dL (ref 30.0–36.0)
MCV: 99 fL (ref 78.0–100.0)
Monocytes Absolute: 2.9 10*3/uL — ABNORMAL HIGH (ref 0.1–1.0)
Monocytes Relative: 31 % — ABNORMAL HIGH (ref 3–12)
Neutro Abs: 3 10*3/uL (ref 1.7–7.7)
Neutrophils Relative %: 31 % — ABNORMAL LOW (ref 43–77)
Platelets: 387 10*3/uL (ref 150–400)
RBC: 3.14 MIL/uL — ABNORMAL LOW (ref 3.87–5.11)
RDW: 16.7 % — ABNORMAL HIGH (ref 11.5–15.5)
WBC: 9.4 10*3/uL (ref 4.0–10.5)

## 2013-06-10 LAB — COMPREHENSIVE METABOLIC PANEL
ALT: 17 U/L (ref 0–35)
AST: 21 U/L (ref 0–37)
Albumin: 3.9 g/dL (ref 3.5–5.2)
Alkaline Phosphatase: 84 U/L (ref 39–117)
BUN: 40 mg/dL — ABNORMAL HIGH (ref 6–23)
CO2: 26 mEq/L (ref 19–32)
Calcium: 9.1 mg/dL (ref 8.4–10.5)
Chloride: 96 mEq/L (ref 96–112)
Creat: 1.53 mg/dL — ABNORMAL HIGH (ref 0.50–1.10)
Glucose, Bld: 95 mg/dL (ref 70–99)
Potassium: 4.5 mEq/L (ref 3.5–5.3)
Sodium: 133 mEq/L — ABNORMAL LOW (ref 135–145)
Total Bilirubin: 0.3 mg/dL (ref 0.3–1.2)
Total Protein: 6.6 g/dL (ref 6.0–8.3)

## 2013-06-10 NOTE — Progress Notes (Signed)
   Subjective:    Patient ID: [name], female    DOB: 06-25-1940, 73 y.o.   MRN: [id number]  HPI Follow up on volume depletion. She received a liter of normal saline and was at [facility] from 1 until 7 PM yesterday. Took about 3 hours for her fluid to go in. They were not able to start enteral 4 PM due to short staffing. Says she drank a fair amount of Gatorade yesterday but I still do not think she is drinking enough fluid. I want her to drink 6 ounces every hour while she is up and awake. For now she should stop her diuretic. Creatinine has improved from 1.53 yesterday to 1.40. I'm going to have her return Monday, January 19  to have another office visit and b- met.   Review of Systems     Objective:   Physical Exam  she is no longer orthostatic. Chest clear. Cardiac exam regular rate and rhythm normal S1 and S2. Skin turgor has improved. Extremities without edema.        Assessment & Plan:  Via depletion  Mouth ulcers  Rheumatoid arthritis  Plan: Return Monday, January 19 for followup. Says she has appointment to see Dr. Ouida Sills January 21

## 2013-06-10 NOTE — Patient Instructions (Signed)
Drink 6 ounces of fluid every hour while you are up and awake. Return on Monday, January 19

## 2013-06-13 ENCOUNTER — Other Ambulatory Visit: Payer: Medicare Other | Admitting: Internal Medicine

## 2013-06-13 DIAGNOSIS — E860 Dehydration: Secondary | ICD-10-CM

## 2013-06-13 LAB — BASIC METABOLIC PANEL
BUN: 16 mg/dL (ref 6–23)
CO2: 27 mEq/L (ref 19–32)
Calcium: 8.9 mg/dL (ref 8.4–10.5)
Chloride: 99 mEq/L (ref 96–112)
Creat: 1.1 mg/dL (ref 0.50–1.10)
Glucose, Bld: 91 mg/dL (ref 70–99)
Potassium: 4.1 mEq/L (ref 3.5–5.3)
Sodium: 135 mEq/L (ref 135–145)

## 2013-06-13 NOTE — Progress Notes (Signed)
Patient informed. 

## 2013-06-20 ENCOUNTER — Telehealth: Payer: Self-pay

## 2013-06-20 NOTE — Telephone Encounter (Signed)
Patient states her feet are swellling quite a lot. Still pushing po fluids.

## 2013-06-20 NOTE — Telephone Encounter (Signed)
Restart HCTZ and watch salt intake. No need to push fluids if getting better.

## 2013-06-20 NOTE — Telephone Encounter (Signed)
Patient informed. 

## 2013-08-03 ENCOUNTER — Telehealth: Payer: Self-pay

## 2013-08-03 NOTE — Telephone Encounter (Signed)
Patient calls today reporting she is breaking out in red blotches on her forearms. Woke up this am with them. Advised to call Dr. Ubaldo Glassing at [facility].

## 2013-08-21 ENCOUNTER — Other Ambulatory Visit: Payer: Self-pay | Admitting: Internal Medicine

## 2013-09-19 ENCOUNTER — Encounter: Payer: Self-pay | Admitting: Internal Medicine

## 2013-09-19 ENCOUNTER — Ambulatory Visit (INDEPENDENT_AMBULATORY_CARE_PROVIDER_SITE_OTHER): Payer: Medicare Other | Admitting: Internal Medicine

## 2013-09-19 VITALS — BP 120/64 | HR 72 | Temp 97.5°F | Wt 170.0 lb

## 2013-09-19 DIAGNOSIS — E119 Type 2 diabetes mellitus without complications: Secondary | ICD-10-CM

## 2013-09-19 DIAGNOSIS — Z79899 Other long term (current) drug therapy: Secondary | ICD-10-CM

## 2013-09-19 DIAGNOSIS — E785 Hyperlipidemia, unspecified: Secondary | ICD-10-CM

## 2013-09-19 DIAGNOSIS — I10 Essential (primary) hypertension: Secondary | ICD-10-CM

## 2013-09-19 LAB — HEPATIC FUNCTION PANEL
ALT: 16 U/L (ref 0–35)
AST: 25 U/L (ref 0–37)
Albumin: 3.8 g/dL (ref 3.5–5.2)
Alkaline Phosphatase: 109 U/L (ref 39–117)
Bilirubin, Direct: 0.1 mg/dL (ref 0.0–0.3)
Indirect Bilirubin: 0.3 mg/dL (ref 0.2–1.2)
Total Bilirubin: 0.4 mg/dL (ref 0.2–1.2)
Total Protein: 7 g/dL (ref 6.0–8.3)

## 2013-09-19 LAB — BASIC METABOLIC PANEL
BUN: 42 mg/dL — ABNORMAL HIGH (ref 6–23)
CO2: 23 mEq/L (ref 19–32)
Calcium: 9.5 mg/dL (ref 8.4–10.5)
Chloride: 103 mEq/L (ref 96–112)
Creat: 1.08 mg/dL (ref 0.50–1.10)
Glucose, Bld: 77 mg/dL (ref 70–99)
Potassium: 4.5 mEq/L (ref 3.5–5.3)
Sodium: 136 mEq/L (ref 135–145)

## 2013-09-19 LAB — HEMOGLOBIN A1C
Hgb A1c MFr Bld: 6 % — ABNORMAL HIGH (ref <5.7)
Mean Plasma Glucose: 126 mg/dL — ABNORMAL HIGH (ref <117)

## 2013-09-19 LAB — LIPID PANEL
Cholesterol: 199 mg/dL (ref 0–200)
HDL: 59 mg/dL (ref >39)
LDL Cholesterol: 130 mg/dL — ABNORMAL HIGH (ref 0–99)
Total CHOL/HDL Ratio: 3.4 Ratio
Triglycerides: 49 mg/dL (ref <150)
VLDL: 10 mg/dL (ref 0–40)

## 2013-09-19 MED ORDER — IBUPROFEN 600 MG PO TABS: 600.0000 mg | ORAL_TABLET | Freq: Three times a day (TID) | ORAL | Status: DC | PRN

## 2013-09-19 MED ORDER — POLYETHYLENE GLYCOL 3350 17 GM/SCOOP PO POWD: ORAL | Status: DC

## 2013-09-19 NOTE — Addendum Note (Signed)
Addended by: Brett Canales on: 09/19/2013 12:21 PM   Modules accepted: Orders

## 2013-09-19 NOTE — Progress Notes (Signed)
   Subjective:    Patient ID: [name], female    DOB: July 28, 1940, 73 y.o.   MRN: [id number]  HPI  73 year old 6 female for 6 month recheck on DM, Rheumatoid arthritis seen by Dr. Ouida Sills, HTN and hyperlipidemia. Labs drawn and are pending today. Is off MTX was thought to have caused significant dermatitis with apthous ulcers, hand and foot rashes.Has stopped taking folic acid also. Dermatitis has resolved. Had shoulders injected in March. Saw Dr. Herbert Deaner for diabetic eye exam and glaucoma check in March.Hx bilateral cataract surgery.   Needs refill on Ibuprofen.  DM is diet controlled. Does accuchecks about once a month only but says readings are good.     Review of Systems     Objective:   Physical Exam Neck supple without JVD, Thyromegaly, carotid bruits. Chest clear. Cor RRR. Ext without edema. Diabetic foot exam performed. Right index finger DIP joint is sore and hot to touch. Shoulders are tender to touch bilaterally       Assessment & Plan:   Controlled type 2 diabetes mellitus  Hypertension-under good control current regimen  Hyperlipidemia-lipid panel liver functions pending  Rheumatoid arthritis-under the care of Dr. Ouida Sills. Ibuprofen refilled today.  Plan: Patient has appointment for mammogram May 13. Has had diabetic eye exam by Dr. Herbert Deaner in March. Schedule physical examination in 6 months.  25 minutes spent with patient

## 2013-09-19 NOTE — Patient Instructions (Signed)
Continue same meds for HTN and hyperlipiemia. Return for CPE in 6 months and flu vaccine.

## 2013-09-20 LAB — MICROALBUMIN, URINE: Microalb, Ur: 0.5 mg/dL (ref 0.00–1.89)

## 2013-12-19 ENCOUNTER — Other Ambulatory Visit: Payer: Self-pay | Admitting: Internal Medicine

## 2014-02-03 ENCOUNTER — Encounter (HOSPITAL_COMMUNITY): Payer: Self-pay | Admitting: Pharmacy Technician

## 2014-02-07 NOTE — Pre-Procedure Instructions (Signed)
[name]  02/07/2014   Your procedure is scheduled on: Thursday 02/16/14  Report to [facility] Admitting at 800 AM.  Call this number if you have problems the morning of surgery: [phone]   Remember:   Do not eat food or drink liquids after midnight.   Take these medicines the morning of surgery with A SIP OF WATER: (Prilosec) Omeprazole   Stop taking aspirin, Ibuprofen, Aleve, BC's, Goody's, Fish Oil, Herbal medications   Do not wear jewelry, make-up or nail polish.  Do not wear lotions, powders, or perfumes. You may wear deodorant.  Do not shave 48 hours prior to surgery. Men may shave face and neck.  Do not bring valuables to the hospital.  [facility] is not responsible                  for any belongings or valuables.               Contacts, dentures or bridgework may not be worn into surgery.  Leave suitcase in the car. After surgery it may be brought to your room.  For patients admitted to the hospital, discharge time is determined by your                treatment team.               Patients discharged the day of surgery will not be allowed to drive  home.  Name and phone number of your driver:  Special Instructions:  Special Instructions: [place] - Preparing for Surgery  Before surgery, you can play an important role.  Because skin is not sterile, your skin needs to be as free of germs as possible.  You can reduce the number of germs on you skin by washing with CHG (chlorahexidine gluconate) soap before surgery.  CHG is an antiseptic cleaner which kills germs and bonds with the skin to continue killing germs even after washing.  Please DO NOT use if you have an allergy to CHG or antibacterial soaps.  If your skin becomes reddened/irritated stop using the CHG and inform your nurse when you arrive at Short Stay.  Do not shave (including legs and underarms) for at least 48 hours prior to the first CHG shower.  You may shave your face.  Please follow  these instructions carefully:   1.  Shower with CHG Soap the night before surgery and the morning of Surgery.  2.  If you choose to wash your hair, wash your hair first as usual with your normal shampoo.  3.  After you shampoo, rinse your hair and body thoroughly to remove the Shampoo.  4.  Use CHG as you would any other liquid soap. You can apply chg directly to the skin and wash gently with scrungie or a clean washcloth.  5.  Apply the CHG Soap to your body ONLY FROM THE NECK DOWN.  Do not use on open wounds or open sores.  Avoid contact with your eyes, ears, mouth and genitals (private parts).  Wash genitals (private parts with your normal soap.  6.  Wash thoroughly, paying special attention to the area where your surgery will be performed.  7.  Thoroughly rinse your body with warm water from the neck down.  8.  DO NOT shower/wash with your normal soap after using and rinsing off the CHG Soap.  9.  Pat yourself dry with a clean towel.  10.  Wear clean pajamas.            11.  Place clean sheets on your bed the night of your first shower and do not sleep with pets.  Day of Surgery  Do not apply any lotions/deodorants the morning of surgery.  Please wear clean clothes to the hospital/surgery center.   Please read over the following fact sheets that you were given: Pain Booklet, Coughing and Deep Breathing, Blood Transfusion Information and Surgical Site Infection Prevention

## 2014-02-08 ENCOUNTER — Ambulatory Visit (HOSPITAL_COMMUNITY)
Admission: RE | Admit: 2014-02-08 | Discharge: 2014-02-08 | Disposition: A | Discharge status: Home / Self Care | Payer: Medicare Other | Source: Ambulatory Visit | Attending: Anesthesiology | Admitting: Anesthesiology

## 2014-02-08 ENCOUNTER — Encounter (HOSPITAL_COMMUNITY): Payer: Self-pay

## 2014-02-08 ENCOUNTER — Encounter (HOSPITAL_COMMUNITY): Payer: Self-pay | Admitting: Pharmacy Technician

## 2014-02-08 ENCOUNTER — Encounter (HOSPITAL_COMMUNITY)
Admission: RE | Admit: 2014-02-08 | Discharge: 2014-02-08 | Disposition: A | Discharge status: Home / Self Care | Payer: Medicare Other | Source: Ambulatory Visit | Attending: Orthopedic Surgery | Admitting: Orthopedic Surgery

## 2014-02-08 DIAGNOSIS — Z01818 Encounter for other preprocedural examination: Secondary | ICD-10-CM | POA: Insufficient documentation

## 2014-02-08 DIAGNOSIS — I10 Essential (primary) hypertension: Secondary | ICD-10-CM | POA: Diagnosis not present

## 2014-02-08 DIAGNOSIS — E119 Type 2 diabetes mellitus without complications: Secondary | ICD-10-CM | POA: Insufficient documentation

## 2014-02-08 HISTORY — DX: Air embolism (traumatic), initial encounter: T79.0XXA

## 2014-02-08 HISTORY — DX: Cardiac murmur, unspecified: R01.1

## 2014-02-08 LAB — COMPREHENSIVE METABOLIC PANEL
ALT: 15 U/L (ref 0–35)
AST: 30 U/L (ref 0–37)
Albumin: 3.7 g/dL (ref 3.5–5.2)
Alkaline Phosphatase: 99 U/L (ref 39–117)
Anion gap: 14 (ref 5–15)
BUN: 34 mg/dL — ABNORMAL HIGH (ref 6–23)
CO2: 22 mEq/L (ref 19–32)
Calcium: 9.4 mg/dL (ref 8.4–10.5)
Chloride: 101 mEq/L (ref 96–112)
Creatinine, Ser: 0.95 mg/dL (ref 0.50–1.10)
GFR calc Af Amer: 66 mL/min — ABNORMAL LOW (ref >90)
GFR calc non Af Amer: 57 mL/min — ABNORMAL LOW (ref >90)
Glucose, Bld: 80 mg/dL (ref 70–99)
Potassium: 4.5 mEq/L (ref 3.7–5.3)
Sodium: 137 mEq/L (ref 137–147)
Total Bilirubin: 0.3 mg/dL (ref 0.3–1.2)
Total Protein: 8 g/dL (ref 6.0–8.3)

## 2014-02-08 LAB — CBC WITH DIFFERENTIAL/PLATELET
Basophils Absolute: 0.1 10*3/uL (ref 0.0–0.1)
Basophils Relative: 1 % (ref 0–1)
Eosinophils Absolute: 0.4 10*3/uL (ref 0.0–0.7)
Eosinophils Relative: 6 % — ABNORMAL HIGH (ref 0–5)
HCT: 35.4 % — ABNORMAL LOW (ref 36.0–46.0)
Hemoglobin: 11.9 g/dL — ABNORMAL LOW (ref 12.0–15.0)
Lymphocytes Relative: 29 % (ref 12–46)
Lymphs Abs: 1.9 10*3/uL (ref 0.7–4.0)
MCH: 31.8 pg (ref 26.0–34.0)
MCHC: 33.6 g/dL (ref 30.0–36.0)
MCV: 94.7 fL (ref 78.0–100.0)
Monocytes Absolute: 0.6 10*3/uL (ref 0.1–1.0)
Monocytes Relative: 9 % (ref 3–12)
Neutro Abs: 3.6 10*3/uL (ref 1.7–7.7)
Neutrophils Relative %: 55 % (ref 43–77)
Platelets: 560 10*3/uL — ABNORMAL HIGH (ref 150–400)
RBC: 3.74 MIL/uL — ABNORMAL LOW (ref 3.87–5.11)
RDW: 14.5 % (ref 11.5–15.5)
WBC: 6.6 10*3/uL (ref 4.0–10.5)

## 2014-02-08 LAB — PROTIME-INR
INR: 0.97 (ref 0.00–1.49)
Prothrombin Time: 12.9 seconds (ref 11.6–15.2)

## 2014-02-08 LAB — APTT: aPTT: 25 seconds (ref 24–37)

## 2014-02-08 LAB — TYPE AND SCREEN
ABO/RH(D): A POS
Antibody Screen: NEGATIVE

## 2014-02-08 NOTE — Progress Notes (Signed)
PCP is Dr Renold Genta Denies seeing a cardiologist. Denies having a recent CXR or EKG. Denies having a card cath, stress test, and states that she had an echo many years ago.

## 2014-02-09 ENCOUNTER — Emergency Department (HOSPITAL_COMMUNITY): Payer: Medicare Other

## 2014-02-09 ENCOUNTER — Encounter (HOSPITAL_COMMUNITY): Payer: Self-pay | Admitting: Emergency Medicine

## 2014-02-09 ENCOUNTER — Emergency Department (HOSPITAL_COMMUNITY)
Admission: EM | Admit: 2014-02-09 | Discharge: 2014-02-09 | Disposition: A | Discharge status: Home / Self Care | Payer: Medicare Other | Attending: Emergency Medicine | Admitting: Emergency Medicine

## 2014-02-09 ENCOUNTER — Telehealth: Payer: Self-pay | Admitting: Internal Medicine

## 2014-02-09 DIAGNOSIS — Z8669 Personal history of other diseases of the nervous system and sense organs: Secondary | ICD-10-CM | POA: Diagnosis not present

## 2014-02-09 DIAGNOSIS — K219 Gastro-esophageal reflux disease without esophagitis: Secondary | ICD-10-CM | POA: Insufficient documentation

## 2014-02-09 DIAGNOSIS — Z872 Personal history of diseases of the skin and subcutaneous tissue: Secondary | ICD-10-CM | POA: Insufficient documentation

## 2014-02-09 DIAGNOSIS — Z8709 Personal history of other diseases of the respiratory system: Secondary | ICD-10-CM | POA: Insufficient documentation

## 2014-02-09 DIAGNOSIS — M129 Arthropathy, unspecified: Secondary | ICD-10-CM | POA: Insufficient documentation

## 2014-02-09 DIAGNOSIS — E7800 Pure hypercholesterolemia, unspecified: Secondary | ICD-10-CM | POA: Diagnosis not present

## 2014-02-09 DIAGNOSIS — I10 Essential (primary) hypertension: Secondary | ICD-10-CM | POA: Diagnosis not present

## 2014-02-09 DIAGNOSIS — Z7982 Long term (current) use of aspirin: Secondary | ICD-10-CM | POA: Diagnosis not present

## 2014-02-09 DIAGNOSIS — Z91040 Latex allergy status: Secondary | ICD-10-CM | POA: Insufficient documentation

## 2014-02-09 DIAGNOSIS — M25519 Pain in unspecified shoulder: Secondary | ICD-10-CM | POA: Diagnosis present

## 2014-02-09 DIAGNOSIS — Z880 Allergy status to penicillin: Secondary | ICD-10-CM | POA: Diagnosis not present

## 2014-02-09 DIAGNOSIS — R011 Cardiac murmur, unspecified: Secondary | ICD-10-CM | POA: Insufficient documentation

## 2014-02-09 DIAGNOSIS — M069 Rheumatoid arthritis, unspecified: Secondary | ICD-10-CM | POA: Diagnosis not present

## 2014-02-09 DIAGNOSIS — M25511 Pain in right shoulder: Secondary | ICD-10-CM

## 2014-02-09 DIAGNOSIS — Z9889 Other specified postprocedural states: Secondary | ICD-10-CM | POA: Diagnosis not present

## 2014-02-09 DIAGNOSIS — Z79899 Other long term (current) drug therapy: Secondary | ICD-10-CM | POA: Diagnosis not present

## 2014-02-09 MED ORDER — HYDROCODONE-ACETAMINOPHEN 5-325 MG PO TABS
1.0000 | ORAL_TABLET | Freq: Once | ORAL | Status: AC
Start: 2014-02-09 — End: 2014-02-09
  Administered 2014-02-09: 1 via ORAL
  Filled 2014-02-09: qty 1

## 2014-02-09 MED ORDER — HYDROCODONE-ACETAMINOPHEN 5-325 MG PO TABS: 1.0000 | ORAL_TABLET | Freq: Four times a day (QID) | ORAL | Status: DC | PRN

## 2014-02-09 NOTE — ED Notes (Signed)
Patient reports she was preparing to have surgery on the left shoulder on the 24th of this month, but her right shoulder was hurting so bad she came to the ER. She also has chronic pain on the right shoulder as well, but was told surgery will be 6 months out.  Patient alert and oriented, pain 10/10 in right shoulder.

## 2014-02-09 NOTE — Discharge Instructions (Signed)

## 2014-02-09 NOTE — Telephone Encounter (Signed)
L shoulder surgery scheduled for 9/24.  She will have the R shoulder surgery to follow in 6 months.  Patient just wanted to notify her PCP.  Made Dr. Renold Genta aware.

## 2014-02-09 NOTE — ED Provider Notes (Signed)
CSN: [id number]     Arrival date & time 02/09/14  0556 History   First MD Initiated Contact with Patient 02/09/14 [phone]     Chief Complaint  Patient presents with  . Shoulder Pain     (Consider location/radiation/quality/duration/timing/severity/associated sxs/prior Treatment) HPI  This is a 73 year old female with a history of hypertension, glaucoma, hypercholesterolemia who presents with right shoulder pain. Patient reports that she has chronic pain in her lateral shoulders. She's due to have surgery on her left shoulder on the 24th of this month. She has had worsening right shoulder pain which she describes as achy and now has sharp shooting pains down her right shoulder. This is similar to the kind of pain that she's been doing with it has been told that she will also have to have surgery on her right shoulder. She ran out of hydrocodone approximately one week ago and has been using Tylenol without relief. She denies any new injury. Pain is worse with movement. Arrival her pain was 10 out of 10. She states now her pain is 6/10. She reports that it is "nagging." Denies any fevers or systemic symptoms.  Past Medical History  Diagnosis Date  . IBS (irritable bowel syndrome)   . GERD (gastroesophageal reflux disease)   . History of estrogen therapy   . Seborrhea     ear  . Dysphasia   . Mitral regurgitation   . Cervical stenosis of spine   . Hypercholesteremia   . Glaucoma   . Essential hypertension, benign   . Arthritis   . Rheumatoid arthritis   . Heart murmur   . Pulmonary air embolism     states she had this wiht her back surgery   Past Surgical History  Procedure Laterality Date  . Lumbar fusion  05/2010  . Tubal ligation    . Toe surgery    . Cataract extraction Bilateral   . Colonoscopy     Family History  Problem Relation Age of Onset  . Healthy Daughter    History  Substance Use Topics  . Smoking status: Never Smoker   . Smokeless tobacco: Never Used  . Alcohol  Use: No   OB History   Grav Para Term Preterm Abortions TAB SAB Ect Mult Living                 Review of Systems  Constitutional: Negative for fever.  Musculoskeletal:       Right shoulder pain  Skin: Negative for color change and wound.  Neurological: Negative for weakness and numbness.  All other systems reviewed and are negative.     Allergies  Amoxicillin-pot clavulanate and Latex  Home Medications   Prior to Admission medications   Medication Sig Start Date End Date Taking? Authorizing Provider  acetaminophen (TYLENOL) 650 MG CR tablet Take 650 mg by mouth daily as needed for pain.    Yes Historical Provider, MD  aspirin 81 MG tablet Take 81 mg by mouth daily.    Yes Historical Provider, MD  lisinopril (PRINIVIL,ZESTRIL) 5 MG tablet Take 5 mg by mouth daily.   Yes Historical Provider, MD  mupirocin ointment (BACTROBAN) 2 % Apply 1 application topically daily as needed (for rash).    Yes Historical Provider, MD  omeprazole (PRILOSEC) 20 MG capsule Take 20 mg by mouth daily.   Yes Historical Provider, MD  polyethylene glycol (MIRALAX / GLYCOLAX) packet Take 17 g by mouth daily as needed (constipation).   Yes Historical Provider, MD  simvastatin (  ZOCOR) 20 MG tablet Take 20 mg by mouth daily.   Yes Historical Provider, MD  triamterene-hydrochlorothiazide (MAXZIDE) 75-50 MG per tablet Take 1 tablet by mouth daily.   Yes Historical Provider, MD  HYDROcodone-acetaminophen (NORCO/VICODIN) 5-325 MG per tablet Take 1 tablet by mouth every 6 (six) hours as needed for moderate pain. 02/09/14   Merryl Hacker, MD   BP 142/67  Pulse 59  Temp(Src) 97.9 F (36.6 C) (Oral)  Resp 18  Ht 5' (1.524 m)  Wt 170 lb (77.111 kg)  BMI 33.20 kg/m2  SpO2 100% Physical Exam  Nursing note and vitals reviewed. Constitutional: She is oriented to person, place, and time. She appears well-developed and well-nourished.  Elderly  HENT:  Head: Normocephalic and atraumatic.  Cardiovascular:  Normal rate, regular rhythm and normal heart sounds.   No murmur heard. Pulmonary/Chest: Effort normal. No respiratory distress. She has no wheezes.  Musculoskeletal: She exhibits no edema.  No tenderness to palpation over the right clavicle or shoulder, no obvious deformity, range of motion limited secondary to pain  Neurological: She is alert and oriented to person, place, and time.  5 Out of 5 strength with bilateral grips, biceps, triceps.  Give way weakness right deltoid  Skin: Skin is warm and dry.  Psychiatric: She has a normal mood and affect.    ED Course  Procedures (including critical care time) Labs Review Labs Reviewed - No data to display  Imaging Review Dg Chest 2 View  02/08/2014   CLINICAL DATA:  Preop for left shoulder surgery, history of hypertension and diabetes  EXAM: CHEST  2 VIEW  COMPARISON:  Chest x-ray of 06/14/2010 and CT angio chest of the same day  FINDINGS: No active infiltrate or effusion is seen. Mediastinal and hilar contours are unremarkable. The heart is borderline enlarged. There are degenerative changes than right shoulder and in the mid to lower thoracic spine.  IMPRESSION: No active cardiopulmonary disease.   Electronically Signed   By: Ivar Drape M.D.   On: 02/08/2014 12:57   Dg Shoulder Right  02/09/2014   CLINICAL DATA:  Chronic right shoulder pain.  EXAM: RIGHT SHOULDER - 2+ VIEW  COMPARISON:  None.  FINDINGS: There is no evidence of fracture or dislocation. The right humeral head is seated within the glenoid fossa. Degenerative change is noted at the glenohumeral joint, with mild cortical irregularity and subcortical cyst formation at the medial aspect of the right humeral head.  The acromioclavicular joint is unremarkable in appearance. No significant soft tissue abnormalities are seen. The visualized portions of the right lung are clear.  IMPRESSION: No evidence of fracture or dislocation. Degenerative change at the right glenohumeral joint, with  mild cortical irregularity and subcortical cyst formation at the medial aspect of the right humeral head.   Electronically Signed   By: Garald Balding M.D.   On: 02/09/2014 06:53     EKG Interpretation None      MDM   Final diagnoses:  Shoulder joint pain, right    Patient presents with right shoulder pain. Appears to be chronic with acute exacerbation. No overlying skin changes.  No evidence of neurologic dysfunction. Plain films showed degenerative changes. Patient given Norco in the emergency room. She needs to followup closely with her orthopedist regarding progression of her pain.  After history, exam, and medical workup I feel the patient has been appropriately medically screened and is safe for discharge home. Pertinent diagnoses were discussed with the patient. Patient was given return  precautions.     Merryl Hacker, MD 02/09/14 [phone]

## 2014-02-15 MED ORDER — LACTATED RINGERS IV SOLN
INTRAVENOUS | Status: DC
  Administered 2014-02-16: 09:00:00 via INTRAVENOUS

## 2014-02-15 MED ORDER — CEFAZOLIN SODIUM-DEXTROSE 2-3 GM-% IV SOLR
2.0000 g | INTRAVENOUS | Status: AC
  Administered 2014-02-16: 2 g via INTRAVENOUS
  Filled 2014-02-15: qty 50

## 2014-02-16 ENCOUNTER — Inpatient Hospital Stay (HOSPITAL_COMMUNITY)
Admission: RE | Admit: 2014-02-16 | Discharge: 2014-02-17 | DRG: 483 | Disposition: A | Discharge status: Home Health | Payer: Medicare Other | Source: Ambulatory Visit | Attending: Orthopedic Surgery | Admitting: Orthopedic Surgery

## 2014-02-16 ENCOUNTER — Encounter (HOSPITAL_COMMUNITY): Payer: Self-pay | Admitting: *Deleted

## 2014-02-16 ENCOUNTER — Encounter (HOSPITAL_COMMUNITY): Payer: Medicare Other | Admitting: Anesthesiology

## 2014-02-16 ENCOUNTER — Encounter (HOSPITAL_COMMUNITY)
Admission: RE | Disposition: A | Discharge status: Home Health | Payer: Self-pay | Source: Ambulatory Visit | Attending: Orthopedic Surgery

## 2014-02-16 ENCOUNTER — Inpatient Hospital Stay (HOSPITAL_COMMUNITY): Payer: Medicare Other | Admitting: Anesthesiology

## 2014-02-16 DIAGNOSIS — E7800 Pure hypercholesterolemia, unspecified: Secondary | ICD-10-CM | POA: Diagnosis present

## 2014-02-16 DIAGNOSIS — K589 Irritable bowel syndrome without diarrhea: Secondary | ICD-10-CM | POA: Diagnosis present

## 2014-02-16 DIAGNOSIS — Z9223 Personal history of estrogen therapy: Secondary | ICD-10-CM | POA: Diagnosis not present

## 2014-02-16 DIAGNOSIS — Z79899 Other long term (current) drug therapy: Secondary | ICD-10-CM

## 2014-02-16 DIAGNOSIS — Z7982 Long term (current) use of aspirin: Secondary | ICD-10-CM | POA: Diagnosis not present

## 2014-02-16 DIAGNOSIS — S43429A Sprain of unspecified rotator cuff capsule, initial encounter: Principal | ICD-10-CM | POA: Diagnosis present

## 2014-02-16 DIAGNOSIS — K219 Gastro-esophageal reflux disease without esophagitis: Secondary | ICD-10-CM | POA: Diagnosis present

## 2014-02-16 DIAGNOSIS — Z9849 Cataract extraction status, unspecified eye: Secondary | ICD-10-CM | POA: Diagnosis not present

## 2014-02-16 DIAGNOSIS — M069 Rheumatoid arthritis, unspecified: Secondary | ICD-10-CM | POA: Diagnosis present

## 2014-02-16 DIAGNOSIS — I10 Essential (primary) hypertension: Secondary | ICD-10-CM | POA: Diagnosis present

## 2014-02-16 DIAGNOSIS — M25519 Pain in unspecified shoulder: Secondary | ICD-10-CM | POA: Diagnosis present

## 2014-02-16 DIAGNOSIS — Z96619 Presence of unspecified artificial shoulder joint: Secondary | ICD-10-CM

## 2014-02-16 DIAGNOSIS — I059 Rheumatic mitral valve disease, unspecified: Secondary | ICD-10-CM | POA: Diagnosis present

## 2014-02-16 HISTORY — PX: REVERSE SHOULDER ARTHROPLASTY: SHX5054

## 2014-02-16 SURGERY — ARTHROPLASTY, SHOULDER, TOTAL, REVERSE
Anesthesia: General | Site: Shoulder | Laterality: Left

## 2014-02-16 MED ORDER — ALUM & MAG HYDROXIDE-SIMETH 200-200-20 MG/5ML PO SUSP
30.0000 mL | ORAL | Status: DC | PRN
Start: 2014-02-16 — End: 2014-02-17

## 2014-02-16 MED ORDER — RIVAROXABAN 10 MG PO TABS
10.0000 mg | ORAL_TABLET | Freq: Every day | ORAL | Status: DC
  Administered 2014-02-17: 10 mg via ORAL
  Filled 2014-02-16: qty 1

## 2014-02-16 MED ORDER — DOCUSATE SODIUM 100 MG PO CAPS
100.0000 mg | ORAL_CAPSULE | Freq: Two times a day (BID) | ORAL | Status: DC
  Administered 2014-02-16 – 2014-02-17 (×2): 100 mg via ORAL
  Filled 2014-02-16 (×3): qty 1

## 2014-02-16 MED ORDER — PROPOFOL 10 MG/ML IV BOLUS
INTRAVENOUS | Status: DC | PRN
  Administered 2014-02-16: 100 mg via INTRAVENOUS

## 2014-02-16 MED ORDER — KETOROLAC TROMETHAMINE 15 MG/ML IJ SOLN
15.0000 mg | Freq: Four times a day (QID) | INTRAMUSCULAR | Status: DC
  Administered 2014-02-16 – 2014-02-17 (×3): 15 mg via INTRAVENOUS
  Filled 2014-02-16 (×7): qty 1

## 2014-02-16 MED ORDER — ONDANSETRON HCL 4 MG/2ML IJ SOLN
INTRAMUSCULAR | Status: AC
  Filled 2014-02-16: qty 2

## 2014-02-16 MED ORDER — POLYETHYLENE GLYCOL 3350 17 G PO PACK: 17.0000 g | PACK | Freq: Every day | ORAL | Status: DC | PRN

## 2014-02-16 MED ORDER — METOCLOPRAMIDE HCL 5 MG/ML IJ SOLN
5.0000 mg | Freq: Three times a day (TID) | INTRAMUSCULAR | Status: DC | PRN
  Administered 2014-02-16: 5 mg via INTRAVENOUS
  Administered 2014-02-17: 10 mg via INTRAVENOUS
  Filled 2014-02-16 (×2): qty 2

## 2014-02-16 MED ORDER — ROCURONIUM BROMIDE 50 MG/5ML IV SOLN
INTRAVENOUS | Status: AC
  Filled 2014-02-16: qty 1

## 2014-02-16 MED ORDER — ROCURONIUM BROMIDE 100 MG/10ML IV SOLN
INTRAVENOUS | Status: DC | PRN
  Administered 2014-02-16: 40 mg via INTRAVENOUS

## 2014-02-16 MED ORDER — ACETAMINOPHEN 650 MG RE SUPP: 650.0000 mg | Freq: Four times a day (QID) | RECTAL | Status: DC | PRN

## 2014-02-16 MED ORDER — METOCLOPRAMIDE HCL 10 MG PO TABS: 5.0000 mg | ORAL_TABLET | Freq: Three times a day (TID) | ORAL | Status: DC | PRN

## 2014-02-16 MED ORDER — PHENYLEPHRINE HCL 10 MG/ML IJ SOLN
10.0000 mg | INTRAVENOUS | Status: DC | PRN
  Administered 2014-02-16: 50 ug/min via INTRAVENOUS

## 2014-02-16 MED ORDER — NEOSTIGMINE METHYLSULFATE 10 MG/10ML IV SOLN
INTRAVENOUS | Status: DC | PRN
  Administered 2014-02-16: 4 mg via INTRAVENOUS

## 2014-02-16 MED ORDER — FENTANYL CITRATE 0.05 MG/ML IJ SOLN
50.0000 ug | INTRAMUSCULAR | Status: DC | PRN
  Administered 2014-02-16: 50 ug via INTRAVENOUS
  Filled 2014-02-16: qty 2

## 2014-02-16 MED ORDER — HYDROMORPHONE HCL 1 MG/ML IJ SOLN
0.2500 mg | INTRAMUSCULAR | Status: DC | PRN
  Administered 2014-02-16 (×2): 0.5 mg via INTRAVENOUS

## 2014-02-16 MED ORDER — SODIUM CHLORIDE 0.9 % IR SOLN
Status: DC | PRN
  Administered 2014-02-16: 1000 mL

## 2014-02-16 MED ORDER — FENTANYL CITRATE 0.05 MG/ML IJ SOLN
INTRAMUSCULAR | Status: AC
  Filled 2014-02-16: qty 5

## 2014-02-16 MED ORDER — ONDANSETRON HCL 4 MG/2ML IJ SOLN
INTRAMUSCULAR | Status: DC | PRN
  Administered 2014-02-16: 4 mg via INTRAVENOUS

## 2014-02-16 MED ORDER — RIVAROXABAN 10 MG PO TABS: 10.0000 mg | ORAL_TABLET | Freq: Every day | ORAL | Status: DC

## 2014-02-16 MED ORDER — CEFAZOLIN SODIUM 1-5 GM-% IV SOLN
1.0000 g | Freq: Four times a day (QID) | INTRAVENOUS | Status: AC
  Administered 2014-02-16 – 2014-02-17 (×3): 1 g via INTRAVENOUS
  Filled 2014-02-16 (×5): qty 50

## 2014-02-16 MED ORDER — PROPOFOL 10 MG/ML IV BOLUS
INTRAVENOUS | Status: AC
  Filled 2014-02-16: qty 20

## 2014-02-16 MED ORDER — DIAZEPAM 5 MG PO TABS
2.5000 mg | ORAL_TABLET | Freq: Four times a day (QID) | ORAL | Status: DC | PRN
  Administered 2014-02-17: 5 mg via ORAL
  Filled 2014-02-16: qty 1

## 2014-02-16 MED ORDER — PHENOL 1.4 % MT LIQD: 1.0000 | OROMUCOSAL | Status: DC | PRN

## 2014-02-16 MED ORDER — PANTOPRAZOLE SODIUM 40 MG PO TBEC
40.0000 mg | DELAYED_RELEASE_TABLET | Freq: Every day | ORAL | Status: DC
  Administered 2014-02-17: 40 mg via ORAL
  Filled 2014-02-16: qty 1

## 2014-02-16 MED ORDER — ONDANSETRON HCL 4 MG/2ML IJ SOLN: 4.0000 mg | Freq: Once | INTRAMUSCULAR | Status: DC | PRN

## 2014-02-16 MED ORDER — OXYCODONE-ACETAMINOPHEN 5-325 MG PO TABS
1.0000 | ORAL_TABLET | ORAL | Status: DC | PRN
Start: 2014-02-16 — End: 2014-02-17
  Administered 2014-02-16 – 2014-02-17 (×4): 2 via ORAL
  Filled 2014-02-16 (×4): qty 2

## 2014-02-16 MED ORDER — FENTANYL CITRATE 0.05 MG/ML IJ SOLN
INTRAMUSCULAR | Status: AC
  Filled 2014-02-16: qty 2

## 2014-02-16 MED ORDER — CHLORHEXIDINE GLUCONATE 4 % EX LIQD: 60.0000 mL | Freq: Once | CUTANEOUS | Status: DC

## 2014-02-16 MED ORDER — LISINOPRIL 5 MG PO TABS
5.0000 mg | ORAL_TABLET | Freq: Every day | ORAL | Status: DC
  Administered 2014-02-17: 5 mg via ORAL
  Filled 2014-02-16: qty 1

## 2014-02-16 MED ORDER — DIAZEPAM 5 MG PO TABS
2.5000 mg | ORAL_TABLET | Freq: Four times a day (QID) | ORAL | Status: DC | PRN
Start: 2014-02-16 — End: 2015-05-10

## 2014-02-16 MED ORDER — HYDROMORPHONE HCL 1 MG/ML IJ SOLN
0.2500 mg | INTRAMUSCULAR | Status: DC | PRN
  Administered 2014-02-16: 1 mg via INTRAVENOUS
  Filled 2014-02-16: qty 1

## 2014-02-16 MED ORDER — FLEET ENEMA 7-19 GM/118ML RE ENEM: 1.0000 | ENEMA | Freq: Once | RECTAL | Status: AC | PRN

## 2014-02-16 MED ORDER — ONDANSETRON HCL 4 MG PO TABS: 4.0000 mg | ORAL_TABLET | Freq: Four times a day (QID) | ORAL | Status: DC | PRN

## 2014-02-16 MED ORDER — TRIAMTERENE-HCTZ 75-50 MG PO TABS
1.0000 | ORAL_TABLET | Freq: Every day | ORAL | Status: DC
  Administered 2014-02-17: 1 via ORAL
  Filled 2014-02-16: qty 1

## 2014-02-16 MED ORDER — GLYCOPYRROLATE 0.2 MG/ML IJ SOLN
INTRAMUSCULAR | Status: DC | PRN
  Administered 2014-02-16: 0.6 mg via INTRAVENOUS

## 2014-02-16 MED ORDER — OXYCODONE-ACETAMINOPHEN 5-325 MG PO TABS: 1.0000 | ORAL_TABLET | ORAL | Status: DC | PRN

## 2014-02-16 MED ORDER — BISACODYL 5 MG PO TBEC: 5.0000 mg | DELAYED_RELEASE_TABLET | Freq: Every day | ORAL | Status: DC | PRN

## 2014-02-16 MED ORDER — ASPIRIN 81 MG PO TABS: 81.0000 mg | ORAL_TABLET | Freq: Every day | ORAL | Status: DC

## 2014-02-16 MED ORDER — BUPIVACAINE-EPINEPHRINE (PF) 0.5% -1:200000 IJ SOLN
INTRAMUSCULAR | Status: DC | PRN
  Administered 2014-02-16: 25 mL via PERINEURAL

## 2014-02-16 MED ORDER — MENTHOL 3 MG MT LOZG: 1.0000 | LOZENGE | OROMUCOSAL | Status: DC | PRN

## 2014-02-16 MED ORDER — LACTATED RINGERS IV SOLN
INTRAVENOUS | Status: DC
  Administered 2014-02-16: 21:00:00 via INTRAVENOUS

## 2014-02-16 MED ORDER — HYDROMORPHONE HCL 1 MG/ML IJ SOLN
INTRAMUSCULAR | Status: AC
  Filled 2014-02-16: qty 1

## 2014-02-16 MED ORDER — ONDANSETRON HCL 4 MG/2ML IJ SOLN
4.0000 mg | Freq: Four times a day (QID) | INTRAMUSCULAR | Status: DC | PRN
  Administered 2014-02-16: 4 mg via INTRAVENOUS
  Filled 2014-02-16: qty 2

## 2014-02-16 MED ORDER — LIDOCAINE HCL (CARDIAC) 20 MG/ML IV SOLN
INTRAVENOUS | Status: AC
  Filled 2014-02-16: qty 5

## 2014-02-16 MED ORDER — ASPIRIN EC 81 MG PO TBEC
81.0000 mg | DELAYED_RELEASE_TABLET | Freq: Every day | ORAL | Status: DC
  Administered 2014-02-17: 81 mg via ORAL
  Filled 2014-02-16: qty 1

## 2014-02-16 MED ORDER — MIDAZOLAM HCL 2 MG/2ML IJ SOLN
1.0000 mg | INTRAMUSCULAR | Status: DC | PRN
  Administered 2014-02-16: 1 mg via INTRAVENOUS
  Filled 2014-02-16: qty 2

## 2014-02-16 MED ORDER — SIMVASTATIN 20 MG PO TABS
20.0000 mg | ORAL_TABLET | Freq: Every day | ORAL | Status: DC
  Administered 2014-02-16: 20 mg via ORAL
  Filled 2014-02-16 (×3): qty 1

## 2014-02-16 MED ORDER — DIPHENHYDRAMINE HCL 12.5 MG/5ML PO ELIX: 12.5000 mg | ORAL_SOLUTION | ORAL | Status: DC | PRN

## 2014-02-16 MED ORDER — FENTANYL CITRATE 0.05 MG/ML IJ SOLN
INTRAMUSCULAR | Status: DC | PRN
  Administered 2014-02-16 (×2): 50 ug via INTRAVENOUS

## 2014-02-16 MED ORDER — LACTATED RINGERS IV SOLN
INTRAVENOUS | Status: DC | PRN
  Administered 2014-02-16: 09:00:00 via INTRAVENOUS

## 2014-02-16 MED ORDER — ACETAMINOPHEN 325 MG PO TABS
650.0000 mg | ORAL_TABLET | Freq: Four times a day (QID) | ORAL | Status: DC | PRN
Start: 2014-02-16 — End: 2014-02-17

## 2014-02-16 SURGICAL SUPPLY — 73 items
ADH SKN CLS LQ APL DERMABOND (GAUZE/BANDAGES/DRESSINGS) ×1
BLADE SAW SGTL 83.5X18.5 (BLADE) ×2 IMPLANT
BRUSH FEMORAL CANAL (MISCELLANEOUS) IMPLANT
CAPT SHOULD DELTAXTEND CEM MOD ×1 IMPLANT
COVER SURGICAL LIGHT HANDLE (MISCELLANEOUS) ×2 IMPLANT
DERMABOND ADHESIVE PROPEN (GAUZE/BANDAGES/DRESSINGS) ×1
DERMABOND ADVANCED .7 DNX6 (GAUZE/BANDAGES/DRESSINGS) IMPLANT
DRAPE INCISE IOBAN 66X45 STRL (DRAPES) ×2 IMPLANT
DRAPE PROXIMA HALF (DRAPES) ×1 IMPLANT
DRAPE SURG 17X11 SM STRL (DRAPES) ×2 IMPLANT
DRAPE U-SHAPE 47X51 STRL (DRAPES) ×2 IMPLANT
DRILL BIT 7/64X5 (BIT) ×2 IMPLANT
DRSG AQUACEL AG ADV 3.5X10 (GAUZE/BANDAGES/DRESSINGS) ×2 IMPLANT
DRSG MEPILEX BORDER 4X8 (GAUZE/BANDAGES/DRESSINGS) IMPLANT
DURAPREP 26ML APPLICATOR (WOUND CARE) ×4 IMPLANT
ELECT BLADE 4.0 EZ CLEAN MEGAD (MISCELLANEOUS) ×2
ELECT CAUTERY BLADE 6.4 (BLADE) ×2 IMPLANT
ELECT REM PT RETURN 9FT ADLT (ELECTROSURGICAL) ×2
ELECTRODE BLDE 4.0 EZ CLN MEGD (MISCELLANEOUS) ×1 IMPLANT
ELECTRODE REM PT RTRN 9FT ADLT (ELECTROSURGICAL) ×1 IMPLANT
FACESHIELD WRAPAROUND (MASK) ×6 IMPLANT
FACESHIELD WRAPAROUND OR TEAM (MASK) ×3 IMPLANT
GLOVE BIO SURGEON STRL SZ7.5 (GLOVE) ×2 IMPLANT
GLOVE BIO SURGEON STRL SZ8 (GLOVE) ×2 IMPLANT
GLOVE BIOGEL PI IND STRL 7.0 (GLOVE) IMPLANT
GLOVE BIOGEL PI IND STRL 8 (GLOVE) IMPLANT
GLOVE BIOGEL PI INDICATOR 7.0 (GLOVE) ×1
GLOVE BIOGEL PI INDICATOR 8 (GLOVE) ×1
GLOVE EUDERMIC 7 POWDERFREE (GLOVE) ×2 IMPLANT
GLOVE SS BIOGEL STRL SZ 7.5 (GLOVE) ×1 IMPLANT
GLOVE SUPERSENSE BIOGEL SZ 7.5 (GLOVE) ×1
GLOVE SURG SS PI 7.5 STRL IVOR (GLOVE) ×2 IMPLANT
GOWN STRL REUS W/ TWL LRG LVL3 (GOWN DISPOSABLE) IMPLANT
GOWN STRL REUS W/ TWL XL LVL3 (GOWN DISPOSABLE) ×2 IMPLANT
GOWN STRL REUS W/TWL LRG LVL3 (GOWN DISPOSABLE)
GOWN STRL REUS W/TWL XL LVL3 (GOWN DISPOSABLE) ×4
HANDPIECE INTERPULSE COAX TIP (DISPOSABLE)
KIT BASIN OR (CUSTOM PROCEDURE TRAY) ×2 IMPLANT
KIT ROOM TURNOVER OR (KITS) ×2 IMPLANT
MANIFOLD NEPTUNE II (INSTRUMENTS) ×2 IMPLANT
NDL HYPO 25GX1X1/2 BEV (NEEDLE) IMPLANT
NDL SUT 6 .5 CRC .975X.05 MAYO (NEEDLE) IMPLANT
NEEDLE HYPO 25GX1X1/2 BEV (NEEDLE) IMPLANT
NEEDLE MAYO TAPER (NEEDLE)
NS IRRIG 1000ML POUR BTL (IV SOLUTION) ×2 IMPLANT
PACK SHOULDER (CUSTOM PROCEDURE TRAY) ×2 IMPLANT
PAD ARMBOARD 7.5X6 YLW CONV (MISCELLANEOUS) ×4 IMPLANT
PASSER SUT SWANSON 36MM LOOP (INSTRUMENTS) IMPLANT
PRESSURIZER FEMORAL UNIV (MISCELLANEOUS) IMPLANT
SET HNDPC FAN SPRY TIP SCT (DISPOSABLE) IMPLANT
SLING ARM LRG ADULT FOAM STRAP (SOFTGOODS) IMPLANT
SLING ARM XL FOAM STRAP (SOFTGOODS) ×2 IMPLANT
SPONGE LAP 18X18 X RAY DECT (DISPOSABLE) ×4 IMPLANT
SPONGE LAP 4X18 X RAY DECT (DISPOSABLE) IMPLANT
STRIP CLOSURE SKIN 1/2X4 (GAUZE/BANDAGES/DRESSINGS) ×2 IMPLANT
SUCTION FRAZIER TIP 10 FR DISP (SUCTIONS) ×2 IMPLANT
SUT BONE WAX W31G (SUTURE) IMPLANT
SUT FIBERWIRE #2 38 T-5 BLUE (SUTURE)
SUT MNCRL AB 3-0 PS2 18 (SUTURE) ×2 IMPLANT
SUT VIC AB 1 CT1 27 (SUTURE) ×2
SUT VIC AB 1 CT1 27XBRD ANBCTR (SUTURE) ×1 IMPLANT
SUT VIC AB 2-0 CT1 27 (SUTURE) ×2
SUT VIC AB 2-0 CT1 TAPERPNT 27 (SUTURE) ×1 IMPLANT
SUT VIC AB 2-0 SH 27 (SUTURE)
SUT VIC AB 2-0 SH 27X BRD (SUTURE) IMPLANT
SUTURE FIBERWR #2 38 T-5 BLUE (SUTURE) IMPLANT
SYR 30ML SLIP (SYRINGE) ×2 IMPLANT
SYR CONTROL 10ML LL (SYRINGE) IMPLANT
TOWEL OR 17X24 6PK STRL BLUE (TOWEL DISPOSABLE) ×2 IMPLANT
TOWEL OR 17X26 10 PK STRL BLUE (TOWEL DISPOSABLE) ×2 IMPLANT
TOWER CARTRIDGE SMART MIX (DISPOSABLE) IMPLANT
TRAY FOLEY CATH 16FRSI W/METER (SET/KITS/TRAYS/PACK) IMPLANT
WATER STERILE IRR 1000ML POUR (IV SOLUTION) ×2 IMPLANT

## 2014-02-16 NOTE — H&P (Signed)
[name]    Chief Complaint: left shoulder rotator cuff tear arthropathy HPI: The patient is a 73 y.o. 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 IBS (irritable bowel syndrome)   . GERD (gastroesophageal reflux disease)   . History of estrogen therapy   . Seborrhea     ear  . Dysphasia   . Mitral regurgitation   . Cervical stenosis of spine   . Hypercholesteremia   . Glaucoma   . Essential hypertension, benign   . Arthritis   . Rheumatoid arthritis   . Heart murmur   . Pulmonary air embolism     states she had this wiht her back surgery    Past Surgical History  Procedure Laterality Date  . Lumbar fusion  05/2010  . Tubal ligation    . Toe surgery    . Cataract extraction Bilateral   . Colonoscopy      Family History  Problem Relation Age of Onset  . Healthy Daughter     Social History:  reports that she has never smoked. She has never used smokeless tobacco. She reports that she does not drink alcohol or use illicit drugs.  Allergies:  Allergies  Allergen Reactions  . Amoxicillin-Pot Clavulanate Diarrhea and Itching    Pt states that she does not remember if she is allergic to this medication   . Latex Itching    Latex gloves     Medications Prior to Admission  Medication Sig Dispense Refill  . aspirin 81 MG tablet Take 81 mg by mouth daily.       Marland Kitchen HYDROcodone-acetaminophen (NORCO/VICODIN) 5-325 MG per tablet Take 1 tablet by mouth every 6 (six) hours as needed for moderate pain.  15 tablet  0  . lisinopril (PRINIVIL,ZESTRIL) 5 MG tablet Take 5 mg by mouth daily.      . mupirocin ointment (BACTROBAN) 2 % Apply 1 application topically daily as needed (for rash).       Marland Kitchen omeprazole (PRILOSEC) 20 MG capsule Take 20 mg by mouth daily.      . polyethylene glycol (MIRALAX / GLYCOLAX) packet Take 17 g by mouth daily as needed (constipation).      . simvastatin (ZOCOR) 20 MG tablet Take 20 mg by  mouth daily.      Marland Kitchen triamterene-hydrochlorothiazide (MAXZIDE) 75-50 MG per tablet Take 1 tablet by mouth daily.      Marland Kitchen acetaminophen (TYLENOL) 650 MG CR tablet Take 650 mg by mouth daily as needed for pain.          Physical Exam: left shoulder with painful and restricted motion as noted at recent office visit  Vitals  Temp:  [98.8 F (37.1 C)] 98.8 F (37.1 C) (09/24 0755) Pulse Rate:  [55-68] 68 (09/24 0905) Resp:  [14-18] 16 (09/24 0945) BP: (110-170)/(47-68) 147/49 mmHg (09/24 0945) SpO2:  [100 %] 100 % (09/24 0945) Weight:  [77.111 kg (170 lb)] 77.111 kg (170 lb) (09/24 0755)  Assessment/Plan  Impression: left shoulder rotator cuff tear arthropathy  Plan of Action: Procedure(s): LEFT REVERSE TOTAL SHOULDER ARTHROPLASTY  Landin Tallon M 02/16/2014, 10:01 AM

## 2014-02-16 NOTE — Discharge Instructions (Signed)
° °  Metta Clines. Supple, M.D., F.A.A.O.S. Orthopaedic Surgery Specializing in Arthroscopic and Reconstructive Surgery of the Shoulder and Knee [phone] [address] [place], [place] [address] - Fax [phone]   POST-OP TOTAL SHOULDER REPLACEMENT/SHOULDER HEMIARTHROPLASTY INSTRUCTIONS  1. Call the office at [phone] to schedule your first post-op appointment 10-14 days from the date of your surgery.  2. The bandage over your incision is waterproof. You may begin showering with this dressing on. You may leave this dressing on until first follow up appointment within 2 weeks. If you would like to remove it you may do so after the 5th day. Go slow and tug at the borders gently to break the bond the dressing has with the skin. The steri strips may come off with the dressing. At this point if there is no drainage it is okay to go without a bandage or you may cover it with a light guaze and tape. Leave the steri-strips in place over your incision. You can expect drainage that is bloody or yellow in nature that should gradually decrease from day of surgery. Change your dressing daily until drainage is completely resolved, then you may feel free to go without a bandage. You can also expect significant bruising around your shoulder that will drift down your arm and into your chest wall. This is very normal and should resolve over several days.   3. Wear your sling/immobilizer at all times except to perform the exercises below or to occasionally let your arm dangle by your side to stretch your elbow. You also need to sleep in your sling immobilizer until instructed otherwise.  4. Range of motion to your elbow, wrist, and hand are encouraged 3-5 times daily. Exercise to your hand and fingers helps to reduce swelling you may experience.  5. Utilize ice to the shoulder 3-5 times minimum a day and additionally if you are experiencing pain.  6. Prescriptions for a pain medication and a muscle  relaxant are provided for you. It is recommended that if you are experiencing pain that you pain medication alone is not controlling, add the muscle relaxant along with the pain medication which can give additional pain relief. The first 1-2 days is generally the most severe of your pain and then should gradually decrease. As your pain lessens it is recommended that you decrease your use of the pain medications to an "as needed basis'" only and to always comply with the recommended dosages of the pain medications.  7. Pain medications can produce constipation along with their use. If you experience this, the use of an over the counter stool softener or laxative daily is recommended.   8. For most patients, if insurance allows, home health services to include therapy has been arranged.  9. For additional questions or concerns, please do not hesitate to call the office. If after hours there is an answering service to forward your concerns to the physician on call.  POST-OP EXERCISES  Pendulum Exercises  Perform pendulum exercises while standing and bending at the waist. Support your uninvolved arm on a table or chair and allow your operated arm to hang freely. Make sure to do these exercises passively - not using you shoulder muscles.  Repeat 20 times. Do 3 sessions per day.    Take Xarelto for 5 days, prescription provided

## 2014-02-16 NOTE — Transfer of Care (Signed)
Immediate Anesthesia Transfer of Care Note  Patient: [name]  Procedure(s) Performed: Procedure(s): LEFT REVERSE TOTAL SHOULDER ARTHROPLASTY (Left)  Patient Location: PACU  Anesthesia Type:GA combined with regional for post-op pain  Level of Consciousness: awake, alert , oriented and patient cooperative  Airway & Oxygen Therapy: Patient Spontanous Breathing  Post-op Assessment: Report given to PACU RN, Post -op Vital signs reviewed and stable and Patient moving all extremities  Post vital signs: Reviewed and stable  Complications: No apparent anesthesia complications

## 2014-02-16 NOTE — Anesthesia Postprocedure Evaluation (Signed)
  Anesthesia Post-op Note  Patient: [name]  Procedure(s) Performed: Procedure(s): LEFT REVERSE TOTAL SHOULDER ARTHROPLASTY (Left)  Patient Location: PACU  Anesthesia Type:GA combined with regional for post-op pain  Level of Consciousness: awake, alert  and oriented  Airway and Oxygen Therapy: Patient Spontanous Breathing  Post-op Pain: mild  Post-op Assessment: Post-op Vital signs reviewed  Post-op Vital Signs: Reviewed  Last Vitals:  Filed Vitals:   02/16/14 1419  BP: 100/54  Pulse: 55  Temp:   Resp: 9    Complications: No apparent anesthesia complications

## 2014-02-16 NOTE — Anesthesia Preprocedure Evaluation (Addendum)
Anesthesia Evaluation  Patient identified by MRN, date of birth, ID band Patient awake    Reviewed: Allergy & Precautions, H&P , NPO status , Patient's Chart, lab work & pertinent test results  Airway Mallampati: II TM Distance: >3 FB Neck ROM: Full    Dental  (+) Teeth Intact, Dental Advisory Given   Pulmonary neg pulmonary ROS,  breath sounds clear to auscultation        Cardiovascular hypertension, Pt. on medications + Valvular Problems/Murmurs MR Rhythm:Regular Rate:Normal     Neuro/Psych negative neurological ROS  negative psych ROS   GI/Hepatic Neg liver ROS, GERD-  ,  Endo/Other  diabetes, Well Controlled, Type 2  Renal/GU negative Renal ROS  negative genitourinary   Musculoskeletal  (+) Arthritis -,   Abdominal   Peds  Hematology  (+) anemia , Hgb 11.9   Anesthesia Other Findings   Reproductive/Obstetrics negative OB ROS                        Anesthesia Physical Anesthesia Plan  ASA: II  Anesthesia Plan: General   Post-op Pain Management:    Induction: Intravenous  Airway Management Planned: Oral ETT  Additional Equipment:   Intra-op Plan:   Post-operative Plan: Extubation in OR  Informed Consent: I have reviewed the patients History and Physical, chart, labs and discussed the procedure including the risks, benefits and alternatives for the proposed anesthesia with the patient or authorized representative who has indicated his/her understanding and acceptance.   Dental advisory given  Plan Discussed with: CRNA, Anesthesiologist and Surgeon  Anesthesia Plan Comments:         Anesthesia Quick Evaluation

## 2014-02-16 NOTE — Progress Notes (Signed)
Utilization review completed.  

## 2014-02-16 NOTE — Plan of Care (Signed)
Problem: Consults Goal: Diagnosis- Total Joint Replacement L reverse total shoulder replacement

## 2014-02-16 NOTE — Op Note (Signed)
02/16/2014  12:23 PM  PATIENT:   [name]  73 y.o. female  PRE-OPERATIVE DIAGNOSIS:  left shoulder rotator cuff tear arthropathy  POST-OPERATIVE DIAGNOSIS:  same  PROCEDURE:  Left reverse shoulder arthroplasty #10 stem press fit, +9poly, 38 eccentric glenosphere  SURGEON:  Yeng Frankie, Metta Clines M.D.  ASSISTANTS: Shuford pac   ANESTHESIA:   GET + ISB  EBL: 250  SPECIMEN:  none  Drains: none   PATIENT DISPOSITION:  PACU - hemodynamically stable.    PLAN OF CARE: Admit to inpatient   Dictation# [id number]

## 2014-02-16 NOTE — Progress Notes (Signed)
Dr. Deatra Canter in to see pt. Made aware that pt drank water at 0400 this morning, okay to proceed with block and surgery per him.

## 2014-02-16 NOTE — Anesthesia Procedure Notes (Signed)
Anesthesia Regional Block:  Interscalene brachial plexus block  Pre-Anesthetic Checklist: ,, timeout performed, Correct Patient, Correct Site, Correct Laterality, Correct Procedure, Correct Position, site marked, Risks and benefits discussed,  Surgical consent,  Pre-op evaluation,  At surgeon's request and post-op pain management   Prep: chloraprep       Needles:  Injection technique: Single-shot  Needle Type: Echogenic Needle     Needle Length: 5cm 5 cm Needle Gauge: 21 and 21 G    Additional Needles:  Procedures: ultrasound guided (picture in chart) Interscalene brachial plexus block  Nerve Stimulator or Paresthesia:  Response: Deltoid and triceps, 0.5 mA,   Additional Responses:   Narrative:  Start time: 02/16/2014 9:05 AM End time: 02/16/2014 9:15 AM Injection made incrementally with aspirations every 5 mL.  Performed by: Personally  Anesthesiologist: Suzette Battiest, MD  Additional Notes: Sterile technique and prep. Total of 25cc's 0.5% bupivicaine with 1:200k epi injected incrementally. No complications noted.

## 2014-02-17 ENCOUNTER — Encounter (HOSPITAL_COMMUNITY): Payer: Self-pay | Admitting: Orthopedic Surgery

## 2014-02-17 MED ORDER — HYDROCODONE-ACETAMINOPHEN 5-325 MG PO TABS: 1.0000 | ORAL_TABLET | ORAL | Status: DC | PRN

## 2014-02-17 NOTE — Progress Notes (Signed)
PT Note  Patient Details Name: [name] MRN: [id number] DOB: 11/15/1940   Cancelled Treatment:    Reason Eval/Treat Not Completed: PT screened, no needs identified, will sign off  Spoke with occupational therapy after initial evaluation. OT reports patient is functioning at a high level of independence and no physical therapy is indicated at this time. PT is signing-off. Please re-order if there is any significant change in patient status. Thank you for this referral.  Elayne Snare, [place]   Ellouise Newer 02/17/2014, 10:43 AM

## 2014-02-17 NOTE — Progress Notes (Signed)
Patient discharged with family to home. Discharge instructions given with no concern. No s/sx of acute distress.

## 2014-02-17 NOTE — Care Management Note (Signed)
CARE MANAGEMENT NOTE 02/17/2014  Patient:  [name], [name]   Account Number:  [id number]  Date Initiated:  02/17/2014  Documentation initiated by:  Ricki Miller  Subjective/Objective Assessment:   73 yr old female admitted with left shoulder rotator cuff tear, s/p left reverse total shoulder.     Action/Plan:   Case manager spoke with patient concerning home health needs. Choice offered, referral called to [facility].No DME needs noted. Patient has family support at discharge.   Anticipated DC Date:  02/17/2014   Anticipated DC Plan:  [place]  CM consult      [facility] Choice  HOME HEALTH   Choice offered to / List presented to:  C-1 Patient   DME arranged  NA        [place] arranged  [place].   Status of service:  Completed, signed off Medicare Important Message given?  NA - LOS <3 / Initial given by admissions (If response is "NO", the following Medicare IM given date fields will be blank) Date Medicare IM given:   Medicare IM given by:   Date Additional Medicare IM given:   Additional Medicare IM given by:    Discharge Disposition:  [place]  Per UR Regulation:  Reviewed for med. necessity/level of care/duration of stay

## 2014-02-17 NOTE — Op Note (Signed)
[name], SCOGIN NO.:  [id number]  MEDICAL RECORD NO.:  [id number]  LOCATION:  5N10C                        FACILITY:  [facility]  PHYSICIAN:  Metta Clines. Lamond Glantz, M.D.  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  PROCEDURE:  Left shoulder reverse arthroplasty utilizing a press-fit size 10 DePuy stem, a +9 polyethylene insert and a 38 eccentric glenosphere.  SURGEON:  Metta Clines. Ivo Moga, MD.  Terrence DupontReather Laurence. Shuford, PA-C  ANESTHESIA:  General endotracheal as well as an interscalene block.  ESTIMATED BLOOD LOSS:  250 mL.  DRAINS:  None.  HISTORY:  Ms. [name] is a 73 year old female who has had chronic and progressive increasing left shoulder pain related to an end-stage rotator cuff tear arthropathy.  Due to her ongoing pain and functional limitations, and failure to respond to conservative management, she was brought to the operating room at this time for planned left reverse shoulder arthroplasty.  Preoperatively, I counseled Ms. Ouzts on treatment options as well as risks versus benefits thereof.  Possible surgical complications were all reviewed including potential for bleeding, infection, neurovascular injury, persistent pain, loss of motion, anesthetic complication, failure of the implant and possible need for additional surgery.  She understands and accepts and agrees with our planned procedure.  PROCEDURE IN DETAIL:  After undergoing routine preop evaluation, the patient received prophylactic antibiotics and a interscalene block was established in the holding area by the Anesthesia Department.  Placed supine on the operating table, underwent smooth induction of an LMA after general endotracheal  anesthesia.  Placed in the beach-chair position and appropriately padded and protected.  Left shoulder girdle region was sterilely prepped and draped in standard fashion.  Time-out was called.  An anterior deltopectoral approach of the left shoulder was made through a 10 cm anterior incision.  Skin flaps were elevated and electrocautery was used for hemostasis.  Dissection carried deeply.  The deltopectoral interval was identified and developed from proximal to distal with blunt dissection.  The upper cm and half of pec major was tenotomized to improve exposure.  We divided adhesions beneath the deltoid and placed a round retractor.  Conjoined tendon was identified and retracted medially.  At this point, we divided the soft tissue sheath over the biceps tendon and the Biceps tenotomy was then performed.  The subscapularis was then divided away from its attachment to the lesser tuberosity and the margin was tagged with #2 fiber wires and then divided the capsule tissues away from the anterior and inferior aspects of the humeral head allowing delivery of the head through the wound.  At this point, the apex of the humeral head was exposed. Residual soft tissues were removed and then we gained access to the humeral medullary canal.  Hand reamed up to size 10 to the appropriate depth.  Intramedullary guide was placed and we performed a humeral head resection at the appropriate level at neutral retroversion.  At this point, we placed a metal  cap over the cut surface of the proximal humerus and then exposed the glenoid with combination of Fukuda, pitchfork, and snake tongue retractors.  The residual proximal stump of the biceps as well as the glenoid labrum was excised circumferentially gaining complete exposure of the periphery of the glenoid.  The central guide pin was placed.  The glenoid was reamed to a subacromial bony bed and soft tissue.  The margin was then removed.  The central peg hole  was then drilled and we impacted the glenoid base plate obtaining excellent fit and fixation.  We then placed the anterior and superior locking screws and the posterior nonlocking screw, all of which obtained excellent bony purchase and fixation and then the locking screws were terminally tightened.  Over this, we then placed our 38 eccentric glenosphere and this was tightened, impacted and retightened with excellent fit and fixation.  At this point, we then returned our attention to the humeral shaft, where we placed our size 10 reaming guide and used the eccentric reamer with a size 1 metaphyseal reamer. This was reamed to the appropriate level.  A trial reduction was then performed showing excellent soft tissue balance.  At this point, we removed the trial.  We assembled our size 10 implant and the implant was then impacted into the humeral canal with excellent fit and fixation at 0 degrees of retroversion.  We then once again performed a series of trial reductions and ultimately the +9 polyethylene insert gave the best soft tissue balance with good shoulder stability.  The final +9 poly was then impacted.  Final reduction showed good soft tissue balance and excellent shoulder stability and motion.  The wound was copiously irrigated.  Hemostasis was obtained.  The subscapularis was then repaired back to the humeral shaft through 2 fiber wires which we placed through the cortical bone.  The deltopectoral interval was then reapproximated with a series of figure-of-eight #1 Vicryl sutures.  A 2- 0 Vicryl was used for the subcu layer and intracuticular 3-0 Monocryl for the skin followed by Dermabond and a Aquacel dressing.  Left arm was then placed into a sling and the patient was awakened, extubated, and taken to recovery room in stable condition.  Jenetta Loges, PA-C was used as an Environmental consultant throughout this case, essential for help with positioning of the patient, positioning of  the extremity, tissue retraction, and soft tissue mobilization, wound exposure, implantation of the prosthesis and wound closure, and intraoperative decision making.     Metta Clines. Nicholus Chandran, M.D.     KMS/MEDQ  D:  02/16/2014  T:  02/17/2014  Job:  [id number]

## 2014-02-17 NOTE — Evaluation (Signed)
Occupational Therapy Evaluation Patient Details Name: [name] MRN: [id number] DOB: 05-26-1941 Today's Date: 02/17/2014    History of Present Illness left reverse TSA   Clinical Impression   This 73 yo female admitted and underwent above presents to acute OT with increased pain, decreased ROM of LUE, and generalized weakness all affecting her ability to take care of herself independently. She will benefit from one more session of acute OT to complete education and then her family will A her at home prn.    Follow Up Recommendations   (follow up per MD)    Equipment Recommendations  None recommended by OT       Precautions / Restrictions Precautions Precautions: Shoulder Shoulder Interventions: Shoulder sling/immobilizer;Off for dressing/bathing/exercises Required Braces or Orthoses: Sling Restrictions Weight Bearing Restrictions: Yes LUE Weight Bearing: Non weight bearing      Mobility Bed Mobility Overal bed mobility: Modified Independent             General bed mobility comments: HOB raised, getting up on right hand side  Transfers Overall transfer level: Needs assistance Equipment used: None Transfers: Sit to/from Stand Sit to Stand: Supervision              Balance Overall balance assessment: Needs assistance Sitting-balance support: No upper extremity supported;Feet supported Sitting balance-Leahy Scale: Good     Standing balance support: No upper extremity supported Standing balance-Leahy Scale: Fair                              ADL Overall ADL's : Needs assistance/impaired Eating/Feeding: Set up;Sitting   Grooming: Supervision/safety;Set up;Standing;Wash/dry hands                   Toilet Transfer: Supervision/safety;Ambulation;Comfort height toilet;Grab bars   Toileting- Clothing Manipulation and Hygiene: Supervision/safety;Sit to/from stand                         Pertinent Vitals/Pain Pain Assessment:  0-10 Pain Score: 9  Pain Location: left shoulder Pain Descriptors / Indicators: Aching;Sharp Pain Intervention(s): Limited activity within patient's tolerance;Patient requesting pain meds-RN notified     Hand Dominance Right   Extremity/Trunk Assessment Upper Extremity Assessment Upper Extremity Assessment: LUE deficits/detail LUE Deficits / Details: reverse TSA this admission (still numb in fingers)           Communication Communication Communication: No difficulties   Cognition Arousal/Alertness: Awake/alert Behavior During Therapy: WFL for tasks assessed/performed Overall Cognitive Status: Within Functional Limits for tasks assessed                           Shoulder Instructions Shoulder Instructions ROM for elbow, wrist and digits of operated UE: Minimal assistance    Home Living Family/patient expects to be discharged to:: Private residence Living Arrangements: Children Available Help at Discharge: Family Type of Home: House Home Access: Stairs to enter Technical brewer of Steps: 2 Entrance Stairs-Rails: Right;Left Home Layout: One level     Bathroom Shower/Tub: Tub/shower unit;Curtain Shower/tub characteristics: Architectural technologist: Standard     Home Equipment: Shower seat;Bedside commode;Cane - single point;Hand held shower head          Prior Functioning/Environment Level of Independence: Independent             OT Diagnosis: Generalized weakness;Acute pain   OT Problem List: Decreased range of motion;Impaired UE functional use;Pain  OT Treatment/Interventions: Self-care/ADL training;Therapeutic exercise;DME and/or AE instruction;Patient/family education;Balance training    OT Goals(Current goals can be found in the care plan section) Acute Rehab OT Goals Patient Stated Goal: home today OT Goal Formulation: With patient Time For Goal Achievement: 02/18/14 Potential to Achieve Goals: Good  OT Frequency: Min 2X/week               End of Session Equipment Utilized During Treatment:  (sling) Nurse Communication: Patient requests pain meds  Activity Tolerance: Patient limited by pain Patient left: in chair;with call bell/phone within reach   Time: 0829-0850 OT Time Calculation (min): 21 min Charges:  OT General Charges $OT Visit: 1 Procedure OT Evaluation $Initial OT Evaluation Tier I: 1 Procedure OT Treatments $Self Care/Home Management : 8-22 mins  Almon Register [phone] 02/17/2014, 9:42 AM

## 2014-02-17 NOTE — Progress Notes (Signed)
Occupational Therapy Treatment and Discharge Patient Details Name: [name] MRN: [id number] DOB: 03/05/1941 Today's Date: 02/17/2014    History of present illness left reverse TSA   OT comments  This 73 yo female finished OT acute education, but needs follow up by [facility] due to lethargy. Education also completed with family that is taking her home to that they can cue her for exercises if needed. Acute OT will sign off.  Follow Up Recommendations  Home health OT    Equipment Recommendations  None recommended by OT       Precautions / Restrictions Precautions Precautions: Shoulder Shoulder Interventions: Shoulder sling/immobilizer;Off for dressing/bathing/exercises Required Braces or Orthoses: Sling Restrictions Weight Bearing Restrictions: Yes LUE Weight Bearing: Non weight bearing       Mobility Bed Mobility Overal bed mobility: Modified Independent             General bed mobility comments: HOB raised, getting up on right hand side  Transfers Overall transfer level: Needs assistance Equipment used: None Transfers: Sit to/from Stand Sit to Stand: Supervision              Balance Overall balance assessment: Needs assistance Sitting-balance support: No upper extremity supported;Feet supported Sitting balance-Leahy Scale: Good     Standing balance support: No upper extremity supported Standing balance-Leahy Scale: Fair                     ADL Overall ADL's : Needs assistance/impaired                   Toilet Transfer: Supervision/safety;Ambulation;Comfort height toilet;Grab bars   Toileting- Clothing Manipulation and Hygiene: Supervision/safety;Sit to/from stand                          Cognition   Behavior During Therapy: [facility] for tasks assessed/performed Overall Cognitive Status: Within Functional Limits for tasks assessed                       Extremity/Trunk Assessment  Upper Extremity Assessment Upper  Extremity Assessment: LUE deficits/detail LUE Deficits / Details: reverse TSA this admission (still numb in fingers)            Exercises Other Exercises Other Exercises: Pt completed 10 reps of pendulums with me for each of the 4; however she needed Mod A due to lethargy (nephew who is taking her home in room so I went over them with him as well). Pt also completed in supine 10 reps of FF (20 degrees today), abduction (40 degrees today), and external rotaition (-20 degrees from neutral today) Donning/doffing shirt without moving shoulder: Minimal assistance Method for sponge bathing under operated UE: Supervision/safety;Set-up Donning/doffing sling/immobilizer: Moderate assistance Correct positioning of sling/immobilizer: Moderate assistance Pendulum exercises (written home exercise program): Moderate assistance (due to lethargy) ROM for elbow, wrist and digits of operated UE: Supervision/safety Sling wearing schedule (on at all times/off for ADL's): Independent Proper positioning of operated UE when showering: Independent Dressing change:  (NA) Positioning of UE while sleeping: Modified independent   Shoulder Instructions Shoulder Instructions Donning/doffing shirt without moving shoulder: Minimal assistance Method for sponge bathing under operated UE: Supervision/safety;Set-up Donning/doffing sling/immobilizer: Moderate assistance Correct positioning of sling/immobilizer: Moderate assistance Pendulum exercises (written home exercise program): Moderate assistance (due to lethargy) ROM for elbow, wrist and digits of operated UE: Supervision/safety Sling wearing schedule (on at all times/off for ADL's): Independent Proper positioning of operated UE when showering:  Independent Dressing change:  (NA) Positioning of UE while sleeping: Modified independent          Pertinent Vitals/ Pain       Pain Assessment: 0-10 Pain Score: 10-Worst pain ever Pain Location: left shoulder post  exercises Pain Descriptors / Indicators: Aching;Sharp Pain Intervention(s): Limited activity within patient's tolerance  Home Living Family/patient expects to be discharged to:: Private residence Living Arrangements: Children Available Help at Discharge: Family Type of Home: House Home Access: Stairs to enter Technical brewer of Steps: 2 Entrance Stairs-Rails: Right;Left Home Layout: One level     Bathroom Shower/Tub: Tub/shower unit;Curtain Shower/tub characteristics: Architectural technologist: Standard     Home Equipment: Shower seat;Bedside commode;Cane - single point;Hand held shower head          Prior Functioning/Environment Level of Independence: Independent            Frequency Min 2X/week     Progress Toward Goals  OT Goals(current goals can now be found in the care plan section)  Progress towards OT goals:  (All education completed with patient and family)  Acute Rehab OT Goals Patient Stated Goal: home today OT Goal Formulation: With patient Time For Goal Achievement: 02/18/14 Potential to Achieve Goals: Good ADL Goals Pt Will Perform Upper Body Bathing: with supervision;sitting;standing (with prn A from family) Pt Will Perform Upper Body Dressing: with supervision;sitting;standing (with prn A from family) Pt/caregiver will Perform Home Exercise Program: Increased ROM;Left upper extremity;With written HEP provided  Plan Discharge plan needs to be updated       End of Session Equipment Utilized During Treatment:  (sling)   Activity Tolerance Patient limited by lethargy (once RN gave pt meds for nausea)   Patient Left in bed;with call bell/phone within reach   Nurse Communication  (Pt ready to go from therapy standpoint and family here)        Time: 1002-1100 OT Time Calculation (min): 58 min  Charges: OT General Charges $OT Visit: 1 Procedure OT Evaluation $Initial OT Evaluation Tier I: 1 Procedure OT Treatments $Self Care/Home  Management : 23-37 mins $Therapeutic Exercise: 23-37 mins  Almon Register [phone] 02/17/2014, 11:23 AM

## 2014-02-17 NOTE — Discharge Summary (Signed)
PATIENT ID:      [name]  MRN:     [id number] DOB/AGE:    October 25, 1940 / 73 y.o.     DISCHARGE SUMMARY  ADMISSION DATE:    02/16/2014 DISCHARGE DATE:    ADMISSION DIAGNOSIS: left shoulder rotator cuff tear arthropathy Past Medical History  Diagnosis Date  . IBS (irritable bowel syndrome)   . GERD (gastroesophageal reflux disease)   . History of estrogen therapy   . Seborrhea     ear  . Dysphasia   . Mitral regurgitation   . Cervical stenosis of spine   . Hypercholesteremia   . Glaucoma   . Essential hypertension, benign   . Arthritis   . Rheumatoid arthritis   . Heart murmur   . Pulmonary air embolism     states she had this wiht her back surgery    DISCHARGE DIAGNOSIS:   Active Problems:   S/P shoulder replacement   PROCEDURE: Procedure(s): LEFT REVERSE TOTAL SHOULDER ARTHROPLASTY on 02/16/2014  CONSULTS:     HISTORY:  See H&P in chart.  HOSPITAL COURSE:  [name] is a 73 y.o. admitted on 02/16/2014 with a chief complaint of left shoulder pain and dysfunction, and found to have a diagnosis of left shoulder rotator cuff tear arthropathy.  They were brought to the operating room on 02/16/2014 and underwent Procedure(s): LEFT REVERSE TOTAL SHOULDER ARTHROPLASTY.    They were given perioperative antibiotics: Anti-infectives   Start     Dose/Rate Route Frequency Ordered Stop   02/16/14 1600  ceFAZolin (ANCEF) IVPB 1 g/50 mL premix     1 g 100 mL/hr over 30 Minutes Intravenous Every 6 hours 02/16/14 1443 02/17/14 0646   02/16/14 0600  ceFAZolin (ANCEF) IVPB 2 g/50 mL premix     2 g 100 mL/hr over 30 Minutes Intravenous On call to O.R. 02/15/14 1504 02/16/14 1125    .  Patient underwent the above named procedure and tolerated it well. The following day they were hemodynamically stable and pain was controlled on oral analgesics. They were neurovascularly intact to the operative extremity. OT was ordered and worked with patient per protocol. They were medically  and orthopaedically stable for discharge on day 1. Because of a prior history of DVT she was discharged on 5 days of xarelto for prophylaxis    DIAGNOSTIC STUDIES:  RECENT RADIOGRAPHIC STUDIES :  Dg Chest 2 View  02/08/2014   CLINICAL DATA:  Preop for left shoulder surgery, history of hypertension and diabetes  EXAM: CHEST  2 VIEW  COMPARISON:  Chest x-ray of 06/14/2010 and CT angio chest of the same day  FINDINGS: No active infiltrate or effusion is seen. Mediastinal and hilar contours are unremarkable. The heart is borderline enlarged. There are degenerative changes than right shoulder and in the mid to lower thoracic spine.  IMPRESSION: No active cardiopulmonary disease.   Electronically Signed   By: Ivar Drape M.D.   On: 02/08/2014 12:57   Dg Shoulder Right  02/09/2014   CLINICAL DATA:  Chronic right shoulder pain.  EXAM: RIGHT SHOULDER - 2+ VIEW  COMPARISON:  None.  FINDINGS: There is no evidence of fracture or dislocation. The right humeral head is seated within the glenoid fossa. Degenerative change is noted at the glenohumeral joint, with mild cortical irregularity and subcortical cyst formation at the medial aspect of the right humeral head.  The acromioclavicular joint is unremarkable in appearance. No significant soft tissue abnormalities are seen. The visualized portions of the  right lung are clear.  IMPRESSION: No evidence of fracture or dislocation. Degenerative change at the right glenohumeral joint, with mild cortical irregularity and subcortical cyst formation at the medial aspect of the right humeral head.   Electronically Signed   By: Garald Balding M.D.   On: 02/09/2014 06:53    RECENT VITAL SIGNS:  Patient Vitals for the past 24 hrs:  BP Temp Pulse Resp SpO2  02/17/14 0538 136/53 mmHg 98.9 F (37.2 C) 77 16 99 %  02/16/14 2057 119/49 mmHg 98 F (36.7 C) 57 16 96 %  02/16/14 1440 104/57 mmHg 97.4 F (36.3 C) 50 14 100 %  02/16/14 1419 100/54 mmHg 98.5 F (36.9 C) 55 9 98 %   02/16/14 1403 96/45 mmHg - 54 8 92 %  02/16/14 1349 89/48 mmHg - 50 8 97 %  02/16/14 1334 113/50 mmHg - 64 18 100 %  02/16/14 1319 110/54 mmHg - 54 11 100 %  02/16/14 1304 122/61 mmHg - 62 16 99 %  02/16/14 1249 122/72 mmHg 98.4 F (36.9 C) 78 21 97 %  02/16/14 0945 147/49 mmHg - - 16 100 %  02/16/14 0930 146/47 mmHg - - 14 100 %  .  RECENT EKG RESULTS:    Orders placed during the hospital encounter of 02/08/14  . EKG 12-LEAD  . EKG 12-LEAD    DISCHARGE INSTRUCTIONS:    DISCHARGE MEDICATIONS:     Medication List         acetaminophen 650 MG CR tablet  Commonly known as:  TYLENOL  Take 650 mg by mouth daily as needed for pain.     aspirin 81 MG tablet  Take 81 mg by mouth daily.     diazepam 5 MG tablet  Commonly known as:  VALIUM  Take 0.5-1 tablets (2.5-5 mg total) by mouth every 6 (six) hours as needed for muscle spasms or sedation.     HYDROcodone-acetaminophen 5-325 MG per tablet  Commonly known as:  NORCO/VICODIN  Take 1-2 tablets by mouth every 4 (four) hours as needed for moderate pain.     lisinopril 5 MG tablet  Commonly known as:  PRINIVIL,ZESTRIL  Take 5 mg by mouth daily.     mupirocin ointment 2 %  Commonly known as:  BACTROBAN  Apply 1 application topically daily as needed (for rash).     omeprazole 20 MG capsule  Commonly known as:  PRILOSEC  Take 20 mg by mouth daily.     polyethylene glycol packet  Commonly known as:  MIRALAX / GLYCOLAX  Take 17 g by mouth daily as needed (constipation).     rivaroxaban 10 MG Tabs tablet  Commonly known as:  XARELTO  Take 1 tablet (10 mg total) by mouth daily.     simvastatin 20 MG tablet  Commonly known as:  ZOCOR  Take 20 mg by mouth daily.     triamterene-hydrochlorothiazide 75-50 MG per tablet  Commonly known as:  MAXZIDE  Take 1 tablet by mouth daily.        FOLLOW UP VISIT:       Follow-up Information   Follow up with Marin Shutter, MD.   Specialty:  Orthopedic Surgery   Contact  information:   [address] [place] [address] [phone]       DISCHARGE TO: Home  DISPOSITION: Good  DISCHARGE CONDITION:  [name] for Dr. Justice Britain 02/17/2014, 9:25 AM

## 2014-03-01 ENCOUNTER — Other Ambulatory Visit: Payer: Self-pay

## 2014-03-01 MED ORDER — PROMETHAZINE HCL 25 MG PO TABS: 25.0000 mg | ORAL_TABLET | ORAL | Status: DC | PRN

## 2014-03-01 MED ORDER — OMEPRAZOLE 20 MG PO CPDR: 20.0000 mg | DELAYED_RELEASE_CAPSULE | Freq: Every day | ORAL | Status: DC

## 2014-03-01 NOTE — Telephone Encounter (Signed)
Patient called complaining of stomach pain.  She has had rotator cuff surgery and she says when she takes the pain medication it makes her stomach hurt.  She is taking omeprazole 20mg  daily but thinks she needs something stronger.  Per Dr Renold Genta conversation with patient, rx for phenergan and omeprazole sent to pharmacy.  Patient aware.

## 2014-03-20 ENCOUNTER — Encounter: Payer: Self-pay | Admitting: Internal Medicine

## 2014-03-20 ENCOUNTER — Ambulatory Visit (INDEPENDENT_AMBULATORY_CARE_PROVIDER_SITE_OTHER): Payer: Medicare Other | Admitting: Internal Medicine

## 2014-03-20 VITALS — BP 122/76 | HR 74 | Temp 98.3°F | Ht 60.0 in | Wt 167.0 lb

## 2014-03-20 DIAGNOSIS — K589 Irritable bowel syndrome without diarrhea: Secondary | ICD-10-CM

## 2014-03-20 DIAGNOSIS — Z23 Encounter for immunization: Secondary | ICD-10-CM

## 2014-03-20 DIAGNOSIS — Z0000 Encounter for general adult medical examination without abnormal findings: Secondary | ICD-10-CM

## 2014-03-20 DIAGNOSIS — F411 Generalized anxiety disorder: Secondary | ICD-10-CM

## 2014-03-20 DIAGNOSIS — M12819 Other specific arthropathies, not elsewhere classified, unspecified shoulder: Secondary | ICD-10-CM

## 2014-03-20 DIAGNOSIS — Z96612 Presence of left artificial shoulder joint: Secondary | ICD-10-CM

## 2014-03-20 DIAGNOSIS — E785 Hyperlipidemia, unspecified: Secondary | ICD-10-CM

## 2014-03-20 DIAGNOSIS — M19011 Primary osteoarthritis, right shoulder: Secondary | ICD-10-CM

## 2014-03-20 DIAGNOSIS — I10 Essential (primary) hypertension: Secondary | ICD-10-CM

## 2014-03-20 DIAGNOSIS — M069 Rheumatoid arthritis, unspecified: Secondary | ICD-10-CM

## 2014-03-20 DIAGNOSIS — E119 Type 2 diabetes mellitus without complications: Secondary | ICD-10-CM

## 2014-03-20 LAB — CBC WITH DIFFERENTIAL/PLATELET
Basophils Absolute: 0.1 10*3/uL (ref 0.0–0.1)
Basophils Relative: 1 % (ref 0–1)
Eosinophils Absolute: 0.3 10*3/uL (ref 0.0–0.7)
Eosinophils Relative: 5 % (ref 0–5)
HCT: 31.7 % — ABNORMAL LOW (ref 36.0–46.0)
Hemoglobin: 10.3 g/dL — ABNORMAL LOW (ref 12.0–15.0)
Lymphocytes Relative: 30 % (ref 12–46)
Lymphs Abs: 2.1 10*3/uL (ref 0.7–4.0)
MCH: 31.2 pg (ref 26.0–34.0)
MCHC: 32.5 g/dL (ref 30.0–36.0)
MCV: 96.1 fL (ref 78.0–100.0)
Monocytes Absolute: 0.8 10*3/uL (ref 0.1–1.0)
Monocytes Relative: 11 % (ref 3–12)
Neutro Abs: 3.7 10*3/uL (ref 1.7–7.7)
Neutrophils Relative %: 53 % (ref 43–77)
Platelets: 552 10*3/uL — ABNORMAL HIGH (ref 150–400)
RBC: 3.3 MIL/uL — ABNORMAL LOW (ref 3.87–5.11)
RDW: 14.7 % (ref 11.5–15.5)
WBC: 6.9 10*3/uL (ref 4.0–10.5)

## 2014-03-20 LAB — COMPREHENSIVE METABOLIC PANEL
ALT: 10 U/L (ref 0–35)
AST: 21 U/L (ref 0–37)
Albumin: 4 g/dL (ref 3.5–5.2)
Alkaline Phosphatase: 90 U/L (ref 39–117)
BUN: 33 mg/dL — ABNORMAL HIGH (ref 6–23)
CO2: 24 mEq/L (ref 19–32)
Calcium: 9.6 mg/dL (ref 8.4–10.5)
Chloride: 102 mEq/L (ref 96–112)
Creat: 1.05 mg/dL (ref 0.50–1.10)
Glucose, Bld: 81 mg/dL (ref 70–99)
Potassium: 4.5 mEq/L (ref 3.5–5.3)
Sodium: 138 mEq/L (ref 135–145)
Total Bilirubin: 0.4 mg/dL (ref 0.2–1.2)
Total Protein: 7.4 g/dL (ref 6.0–8.3)

## 2014-03-20 LAB — POCT URINALYSIS DIPSTICK
Bilirubin, UA: NEGATIVE
Blood, UA: NEGATIVE
Glucose, UA: NEGATIVE
Ketones, UA: NEGATIVE
Leukocytes, UA: NEGATIVE
Nitrite, UA: NEGATIVE
Protein, UA: NEGATIVE
Spec Grav, UA: 1.01
Urobilinogen, UA: NEGATIVE
pH, UA: 6

## 2014-03-20 LAB — LIPID PANEL
Cholesterol: 176 mg/dL (ref 0–200)
HDL: 52 mg/dL (ref >39)
LDL Cholesterol: 109 mg/dL — ABNORMAL HIGH (ref 0–99)
Total CHOL/HDL Ratio: 3.4 Ratio
Triglycerides: 77 mg/dL (ref <150)
VLDL: 15 mg/dL (ref 0–40)

## 2014-03-20 LAB — TSH: TSH: 1.651 u[IU]/mL (ref 0.350–4.500)

## 2014-03-20 NOTE — Progress Notes (Signed)
Subjective:    Patient ID: [name], female    DOB: 06-02-1940, 73 y.o.   MRN: [id number]  HPI  73 year old 61 female for health maintenance and evaluation of medical problems. Hx HTN,  AODM, hyperlipidemia, GE reflux, irritable bowel syndrome, rheumatoid arthritis. Recently had left shoulder replacement. Is undergoing therapy. Surgery was about a month ago. Still having some pain and decreased range of motion left shoulder. Says she also needs a right shoulder replacement. Dr. Ouida Sills treat rheumatoid arthritis. She has a history of glaucoma followed by Dr. Herbert Deaner. History of trace mitral regurgitation, cervical foraminal stenosis, H. pylori gastritis in July 2000, compression fracture T7 in 2000. History of abnormal Pap smear 1985 with colposcopy. Bilateral tubal ligation 1979, D&C for menorrhagia in 1989, marsupialization of Bartholin's cyst 1987.  She had back surgery January 2012 for bilateral radiculopathy which was a right S1 S2 decompressive laminectomy done by Dr. Ronnald Ramp. Subsequently postoperatively developed a pulmonary embolus January 2012. In February 2012 was hospitalized for presumed viral gastroenteritis . History of left wrist de Quervain's tendinitis. Took estrogen replacement in the remote past.  Social history: She is a widow. 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 Does not smoke or consume alcohol.  Family history: Mother died  at age 33 of an MI with history of hypertension.  One brother with history of diabetes. 5 sisters, one of them has diabetes. One daughter died of occult GI malignancy. One brother in good health. Had 9 children.    Review of Systems     Objective:   Physical Exam  Constitutional: She is oriented to person, place, and time. She appears well-developed and well-nourished. No distress.  Cardiovascular:  I/VI  SEM with split S2  Pulmonary/Chest:  Breasts normal female  Abdominal: Soft.    Genitourinary:  Last Pap 2014. Bimanual normal  Musculoskeletal:  Decreased ROM left shoulder s/p shoulder surgery about one month ago.  Neurological: She is alert and oriented to person, place, and time. She has normal reflexes.  Skin: Skin is warm and dry. She is not diaphoretic.  Psychiatric: She has a normal mood and affect. Her behavior is normal. Judgment and thought content normal.          Assessment & Plan:  Diabetes mellitus-stable  Hyperlipidemia-stable on statin medication  Status post left shoulder replacement  Right shoulder arthropathy  Rheumatoid arthritis  Hypertension  Irritable bowel syndrome  Anxiety  Glaucoma  Plan: Continue same medications and return in 6 months. Return in 2 weeks for Prevnar 13. Flu vaccine given today. Continue physical therapy for left shoulder replacement. Continue be followed by rheumatologist for rheumatoid arthritis. Order given to have Zostavax vaccine at  pharmacy  Subjective:   Patient presents for Medicare Annual/Subsequent preventive examination.  Review Past Medical/Family/Social: See above   Risk Factors  Current exercise habits: Sedentary Dietary issues discussed: Low fat low car  Cardiac risk factors: Hypertension, hyperlipidemia, diabetes mellitus, family history  Depression Screen  (Note: if answer to either of the following is "Yes", a more complete depression screening is indicated)   Over the past two weeks, have you felt down, depressed or hopeless? No  Over the past two weeks, have you felt little interest or pleasure in doing things? No Have you lost interest or pleasure in daily life? No Do you often feel hopeless? No Do you cry easily over simple problems? No   Activities of Daily Living  In your  present state of health, do you have any difficulty performing the following activities?:   Driving? No  Managing money? No  Feeding yourself? No  Getting from bed to chair? No  Climbing a flight  of stairs? No  Preparing food and eating?: No  Bathing or showering? No  Getting dressed: No  Getting to the toilet? No  Using the toilet:No  Moving around from place to place: No  In the past year have you fallen or had a near fall?:No  Are you sexually active? No  Do you have more than one partner? No   Hearing Difficulties: No  Do you often ask people to speak up or repeat themselves? No  Do you experience ringing or noises in your ears? No  Do you have difficulty understanding soft or whispered voices? No  Do you feel that you have a problem with memory? No Do you often misplace items? No    Home Safety:  Do you have a smoke alarm at your residence? no Do you have grab bars in the bathroom? No Do you have throw rugs in your house? No   Cognitive Testing  Alert? Yes Normal Appearance?Yes  Oriented to person? Yes Place? Yes  Time? Yes  Recall of three objects? Yes  Can perform simple calculations? Yes  Displays appropriate judgment?Yes  Can read the correct time from a watch face?Yes   List the Names of Other Physician/Practitioners you currently use:  See referral list for the physicians patient is currently seeing.  Dr. Herbert Deaner, ophthalmologist   Review of Systems: See above   Objective:     General appearance: Appears stated age  Head: Normocephalic, without obvious abnormality, atraumatic  Eyes: conj clear, EOMi PEERLA  Ears: normal TM's and external ear canals both ears  Nose: Nares normal. Septum midline. Mucosa normal. No drainage or sinus tenderness.  Throat: lips, mucosa, and tongue normal; teeth and gums normal  Neck: no adenopathy, no carotid bruit, no JVD, supple, symmetrical, trachea midline and thyroid not enlarged, symmetric, no tenderness/mass/nodules  No CVA tenderness.  Lungs: clear to auscultation bilaterally  Breasts: normal appearance, no masses or tenderness Heart: regular rate and rhythm, S1, S2 normal, 1/6 systolic ejection murmur,  click, rub or gallop  Abdomen: soft, non-tender; bowel sounds normal; no masses, no organomegaly  Musculoskeletal: ROM normal in all joints, no crepitus, no deformity, Normal muscle strengthen. Back  is symmetric, no curvature. Skin: Skin color, texture, turgor normal. No rashes or lesions  Lymph nodes: Cervical, supraclavicular, and axillary nodes normal.  Neurologic: CN 2 -12 Normal, Normal symmetric reflexes. Normal coordination and gait  Psych: Alert & Oriented x 3, Mood appear stable.    Assessment:    Annual wellness medicare exam   Plan:    During the course of the visit the patient was educated and counseled about appropriate screening and preventive services including:   Annual mammogram  Regular eye exam per Dr. Herbert Deaner     Patient Instructions (the written plan) was given to the patient.  Medicare Attestation  I have personally reviewed:  The patient's medical and social history  Their use of alcohol, tobacco or illicit drugs  Their current medications and supplements  The patient's functional ability including ADLs,fall risks, home safety risks, cognitive, and hearing and visual impairment  Diet and physical activities  Evidence for depression or mood disorders  The patient's weight, height, BMI, and visual acuity have been recorded in the chart. I have made referrals, counseling, and provided  education to the patient based on review of the above and I have provided the patient with a written personalized care plan for preventive services.

## 2014-03-20 NOTE — Patient Instructions (Signed)
Return in 2 weeks for Prevnar 13. Prescription given to get Zostavax at pharmacy. Otherwise return in 6 months for office visit and lab work.

## 2014-03-21 ENCOUNTER — Telehealth: Payer: Self-pay

## 2014-03-21 LAB — HEMOGLOBIN A1C
Hgb A1c MFr Bld: 6.1 % — ABNORMAL HIGH (ref <5.7)
Mean Plasma Glucose: 128 mg/dL — ABNORMAL HIGH (ref <117)

## 2014-03-21 NOTE — Telephone Encounter (Signed)
Patient daughter informed of lab results.  Return appointment scheduled.

## 2014-03-21 NOTE — Telephone Encounter (Signed)
Message copied by Amado Coe on Tue Mar 21, 2014 10:46 AM ------      Message from: [name]      Created: Tue Mar 21, 2014 10:07 AM       Pt is anemic s/p shoulder surgery. Should take MVI with iron and return to see me with CBC in 6 weeks. ------

## 2014-04-03 ENCOUNTER — Ambulatory Visit: Payer: Medicare Other | Admitting: Internal Medicine

## 2014-04-03 DIAGNOSIS — Z23 Encounter for immunization: Secondary | ICD-10-CM

## 2014-04-03 MED ORDER — PNEUMOCOCCAL 13-VAL CONJ VACC IM SUSP
0.5000 mL | Freq: Once | INTRAMUSCULAR | Status: AC
  Administered 2014-04-03: 0.5 mL via INTRAMUSCULAR

## 2014-04-04 ENCOUNTER — Encounter: Payer: Self-pay | Admitting: Internal Medicine

## 2014-04-04 ENCOUNTER — Other Ambulatory Visit: Payer: Self-pay | Admitting: Internal Medicine

## 2014-04-04 ENCOUNTER — Ambulatory Visit: Payer: Medicare Other | Admitting: Internal Medicine

## 2014-04-24 ENCOUNTER — Other Ambulatory Visit: Payer: Self-pay | Admitting: Internal Medicine

## 2014-05-05 ENCOUNTER — Other Ambulatory Visit: Payer: Medicare Other | Admitting: Internal Medicine

## 2014-05-05 DIAGNOSIS — D649 Anemia, unspecified: Secondary | ICD-10-CM

## 2014-05-06 LAB — CBC WITH DIFFERENTIAL/PLATELET
Basophils Absolute: 0.1 10*3/uL (ref 0.0–0.1)
Basophils Relative: 1 % (ref 0–1)
Eosinophils Absolute: 0.3 10*3/uL (ref 0.0–0.7)
Eosinophils Relative: 4 % (ref 0–5)
HCT: 35.5 % — ABNORMAL LOW (ref 36.0–46.0)
Hemoglobin: 12 g/dL (ref 12.0–15.0)
Lymphocytes Relative: 32 % (ref 12–46)
Lymphs Abs: 2.5 10*3/uL (ref 0.7–4.0)
MCH: 31.6 pg (ref 26.0–34.0)
MCHC: 33.8 g/dL (ref 30.0–36.0)
MCV: 93.4 fL (ref 78.0–100.0)
MPV: 8.6 fL — ABNORMAL LOW (ref 9.4–12.4)
Monocytes Absolute: 0.8 10*3/uL (ref 0.1–1.0)
Monocytes Relative: 10 % (ref 3–12)
Neutro Abs: 4.1 10*3/uL (ref 1.7–7.7)
Neutrophils Relative %: 53 % (ref 43–77)
Platelets: 532 10*3/uL — ABNORMAL HIGH (ref 150–400)
RBC: 3.8 MIL/uL — ABNORMAL LOW (ref 3.87–5.11)
RDW: 15.8 % — ABNORMAL HIGH (ref 11.5–15.5)
WBC: 7.8 10*3/uL (ref 4.0–10.5)

## 2014-05-08 ENCOUNTER — Other Ambulatory Visit: Payer: Self-pay | Admitting: Internal Medicine

## 2014-05-08 ENCOUNTER — Encounter: Payer: Self-pay | Admitting: Internal Medicine

## 2014-05-08 ENCOUNTER — Ambulatory Visit (INDEPENDENT_AMBULATORY_CARE_PROVIDER_SITE_OTHER): Payer: Medicare Other | Admitting: Internal Medicine

## 2014-05-08 ENCOUNTER — Ambulatory Visit: Payer: Self-pay | Admitting: Internal Medicine

## 2014-05-08 VITALS — BP 120/74 | HR 67 | Temp 97.6°F

## 2014-05-08 DIAGNOSIS — D509 Iron deficiency anemia, unspecified: Secondary | ICD-10-CM

## 2014-05-08 DIAGNOSIS — I10 Essential (primary) hypertension: Secondary | ICD-10-CM

## 2014-05-08 DIAGNOSIS — G4762 Sleep related leg cramps: Secondary | ICD-10-CM

## 2014-05-08 DIAGNOSIS — M069 Rheumatoid arthritis, unspecified: Secondary | ICD-10-CM

## 2014-05-08 NOTE — Progress Notes (Signed)
   Subjective:    Patient ID: [name], female    DOB: 1940-12-02, 73 y.o.   MRN: [id number]  HPI  In today to follow-up on anemia status post shoulder arthroplasty surgery in September. We placed her on multivitamin with iron. Hemoglobin has improved from 10.9 g in late October to 12 g. She's feeling better. Was having some presyncopal episodes at work before starting a multivitamin with iron. Has recovered well from shoulder surgery. Taking some extra strength Tylenol. Told her not to exceed 2 tablets 3 times daily. I would like for her to continue taking multivitamin with iron. She has a history of rheumatoid arthritis and sees rheumatologist. She also complaining of some leg cramps at night. Have recommended over-the-counter magnesium supplement.  Review of Systems     Objective:   Physical Exam  Not examined. Spent 25 minutes speaking with patient about multiple issues.      Assessment & Plan:  Anemia-improved. Feel anemia was related to shoulder arthroplasty. Responded to multivitamin with iron  Nocturnal leg cramps-recommend over-the-counter magnesium supplement  Musculoskeletal pain-recommend extra strength Tylenol. She says this does not cause gastric distress like NSAIDS do.  Hypertension-stable  Plan: Patient will return in April. Appointment already made for six-month recheck.

## 2014-05-08 NOTE — Patient Instructions (Signed)
Take magnesium supplement over-the-counter for nocturnal leg cramps. Continue multivitamin with iron. Keep appointment for April 2016 here in this office for six-month recheck. Tylenol extra strength 500 mg 2 tablets every 8 hours as needed for pain

## 2014-05-29 DIAGNOSIS — S4492XD Injury of unspecified nerve at shoulder and upper arm level, left arm, subsequent encounter: Secondary | ICD-10-CM | POA: Diagnosis not present

## 2014-06-14 DIAGNOSIS — M0579 Rheumatoid arthritis with rheumatoid factor of multiple sites without organ or systems involvement: Secondary | ICD-10-CM | POA: Diagnosis not present

## 2014-07-10 ENCOUNTER — Telehealth: Payer: Self-pay | Admitting: Internal Medicine

## 2014-07-10 ENCOUNTER — Encounter: Payer: Self-pay | Admitting: Internal Medicine

## 2014-07-10 MED ORDER — TERCONAZOLE 0.4 % VA CREA: 1.0000 | TOPICAL_CREAM | Freq: Every day | VAGINAL | Status: DC

## 2014-07-10 NOTE — Telephone Encounter (Signed)
Patient called complaining of vaginal itching and discharge not responding over-the-counter medication. She has a history of diabetes mellitus.: Terazol 7 vaginal cream 1 applicator full in vagina daily at bedtime 7 days with one refill.

## 2014-08-04 DIAGNOSIS — H35033 Hypertensive retinopathy, bilateral: Secondary | ICD-10-CM | POA: Diagnosis not present

## 2014-08-04 DIAGNOSIS — Z961 Presence of intraocular lens: Secondary | ICD-10-CM | POA: Diagnosis not present

## 2014-08-04 DIAGNOSIS — H402230 Chronic angle-closure glaucoma, bilateral, stage unspecified: Secondary | ICD-10-CM | POA: Diagnosis not present

## 2014-08-04 DIAGNOSIS — H1013 Acute atopic conjunctivitis, bilateral: Secondary | ICD-10-CM | POA: Diagnosis not present

## 2014-09-13 ENCOUNTER — Telehealth: Payer: Self-pay | Admitting: *Deleted

## 2014-09-13 MED ORDER — FLUCONAZOLE 150 MG PO TABS: 150.0000 mg | ORAL_TABLET | Freq: Once | ORAL | Status: DC

## 2014-09-13 NOTE — Telephone Encounter (Signed)
Patient called and spoke with Sharyn Lull. States she has a yeast infection (vaginal). Per Dr Renold Genta sent script for Diflucan to patient pharmacy.

## 2014-09-14 ENCOUNTER — Telehealth: Payer: Self-pay | Admitting: Internal Medicine

## 2014-09-14 MED ORDER — HYDROCODONE-ACETAMINOPHEN 5-325 MG PO TABS: 1.0000 | ORAL_TABLET | Freq: Four times a day (QID) | ORAL | Status: DC | PRN

## 2014-09-14 NOTE — Telephone Encounter (Signed)
Patient having issues with severe shoulder pain. Has appointment see rheumatologist next week. Written prescription for Norco 5/325 #60 one by mouth every 6 hours when necessary pain with no refill. Has history of rheumatoid arthritis. History of shoulder arthropathy.

## 2014-09-19 ENCOUNTER — Ambulatory Visit (INDEPENDENT_AMBULATORY_CARE_PROVIDER_SITE_OTHER): Payer: Medicare Other | Admitting: Internal Medicine

## 2014-09-19 ENCOUNTER — Encounter: Payer: Self-pay | Admitting: Internal Medicine

## 2014-09-19 VITALS — BP 142/84 | HR 59 | Temp 98.1°F | Wt 170.0 lb

## 2014-09-19 DIAGNOSIS — I10 Essential (primary) hypertension: Secondary | ICD-10-CM | POA: Diagnosis not present

## 2014-09-19 DIAGNOSIS — M069 Rheumatoid arthritis, unspecified: Secondary | ICD-10-CM

## 2014-09-19 DIAGNOSIS — E785 Hyperlipidemia, unspecified: Secondary | ICD-10-CM

## 2014-09-19 DIAGNOSIS — E8881 Metabolic syndrome: Secondary | ICD-10-CM

## 2014-09-19 DIAGNOSIS — M25512 Pain in left shoulder: Secondary | ICD-10-CM | POA: Diagnosis not present

## 2014-09-19 DIAGNOSIS — E119 Type 2 diabetes mellitus without complications: Secondary | ICD-10-CM | POA: Diagnosis not present

## 2014-09-19 DIAGNOSIS — E669 Obesity, unspecified: Secondary | ICD-10-CM

## 2014-09-19 DIAGNOSIS — M12819 Other specific arthropathies, not elsewhere classified, unspecified shoulder: Secondary | ICD-10-CM

## 2014-09-19 DIAGNOSIS — H409 Unspecified glaucoma: Secondary | ICD-10-CM

## 2014-09-19 DIAGNOSIS — Z79899 Other long term (current) drug therapy: Secondary | ICD-10-CM | POA: Diagnosis not present

## 2014-09-19 DIAGNOSIS — M19011 Primary osteoarthritis, right shoulder: Secondary | ICD-10-CM

## 2014-09-19 LAB — HEPATIC FUNCTION PANEL
ALT: 17 U/L (ref 0–35)
AST: 23 U/L (ref 0–37)
Albumin: 4 g/dL (ref 3.5–5.2)
Alkaline Phosphatase: 79 U/L (ref 39–117)
Bilirubin, Direct: 0.1 mg/dL (ref 0.0–0.3)
Indirect Bilirubin: 0.5 mg/dL (ref 0.2–1.2)
Total Bilirubin: 0.6 mg/dL (ref 0.2–1.2)
Total Protein: 7.8 g/dL (ref 6.0–8.3)

## 2014-09-19 LAB — LIPID PANEL
Cholesterol: 215 mg/dL — ABNORMAL HIGH (ref 0–200)
HDL: 56 mg/dL (ref >=46)
LDL Cholesterol: 127 mg/dL — ABNORMAL HIGH (ref 0–99)
Total CHOL/HDL Ratio: 3.8 Ratio
Triglycerides: 159 mg/dL — ABNORMAL HIGH (ref <150)
VLDL: 32 mg/dL (ref 0–40)

## 2014-09-19 LAB — HEMOGLOBIN A1C
Hgb A1c MFr Bld: 6.6 % — ABNORMAL HIGH (ref <5.7)
Mean Plasma Glucose: 143 mg/dL — ABNORMAL HIGH (ref <117)

## 2014-09-19 NOTE — Patient Instructions (Addendum)
RTC in 2 weeks for BP check and nurse visit. Take meds before coming. Lab work is pending. See GYN physician, Dr. Raphael Gibney regarding prolapse.

## 2014-09-19 NOTE — Progress Notes (Signed)
   Subjective:    Patient ID: [name], female    DOB: [birth year], 74 y.o.   MRN: [id number]  HPI  74 year old Black Female with rheumatoid arthritis. Cannot tolerate methotrexate. Recently rheumatologist put her on a prednisone Dosepak for left shoulder pain. She has had left shoulder surgery. Will need right shoulder surgery eventually. We have prescribed hydrocodone 5/325 for pain.  She has a history of glaucoma. Sees [name] and had recent eye exam. We need to put date an Epic. She has appointment see [name] again 01/04/2015.  She is to see Dr. Ubaldo Glassing for GYN care. He is retired. Says she has a long-standing history of what sounds like prolapse. Needs to see GYN physician. Complaining of some vaginal itching, likely related to Candida vaginitis. History of diabetes. We called in Diflucan for her last week. Says she's itching around external genitalia. Terazol 7 vaginal cream prescribed today.  She has a history of type 2 diabetes mellitus, hypertension, hyperlipidemia. Did not take blood pressure medication before coming to the office today. She will need to have repeat blood pressure check on medication in 2 weeks  Says home glucose monitor does not work. New prescription for home glucose monitor, diabetic test strips for daily testing and lancets written.    Review of Systems     Objective:   Physical Exam Skin warm and dry. Nodes none. Neck supple without JVD thyromegaly or carotid bruits. Chest clear to auscultation. Cardiac exam regular rate and rhythm normal S1 and S2. Extremities without edema. Diabetic foot exam within normal limits.       Assessment & Plan:  Hypertension-return in 2 weeks for blood pressure check when patient has had medication prior to coming to office  Hyperlipidemia-lipid panel liver functions drawn  Type 2 diabetes mellitus-hemoglobin A1c drawn. Urine microalbumin obtained  Rheumatoid arthritis-sees Dr. Ouida Sills, rheumatologist. Currently  on prednisone dosepak. Norco 5/325 prescribed for pain in left shoulder  Shoulder arthropathy-status post left shoulder replacement and needs right shoulder surgery  ? Vaginal prolapse-refer to GYN physician  Plan: Return in 2 weeks for blood pressure check. Otherwise return in 6 months for physical examination.

## 2014-09-20 ENCOUNTER — Telehealth: Payer: Self-pay | Admitting: *Deleted

## 2014-09-20 LAB — MICROALBUMIN / CREATININE URINE RATIO
Creatinine, Urine: 53.8 mg/dL
Microalb Creat Ratio: 13 mg/g (ref 0.0–30.0)
Microalb, Ur: 0.7 mg/dL (ref <2.0)

## 2014-09-21 NOTE — Telephone Encounter (Signed)
Spoke with patient daughter given instructions

## 2014-09-25 ENCOUNTER — Other Ambulatory Visit: Payer: Self-pay | Admitting: Internal Medicine

## 2014-09-27 DIAGNOSIS — Z96612 Presence of left artificial shoulder joint: Secondary | ICD-10-CM | POA: Diagnosis not present

## 2014-09-27 DIAGNOSIS — S4492XD Injury of unspecified nerve at shoulder and upper arm level, left arm, subsequent encounter: Secondary | ICD-10-CM | POA: Diagnosis not present

## 2014-10-02 ENCOUNTER — Encounter: Payer: Self-pay | Admitting: Internal Medicine

## 2014-10-02 ENCOUNTER — Ambulatory Visit (INDEPENDENT_AMBULATORY_CARE_PROVIDER_SITE_OTHER): Payer: Medicare Other | Admitting: Internal Medicine

## 2014-10-02 ENCOUNTER — Telehealth: Payer: Self-pay | Admitting: Internal Medicine

## 2014-10-02 VITALS — BP 138/78 | HR 73 | Temp 97.3°F | Wt 174.0 lb

## 2014-10-02 DIAGNOSIS — L509 Urticaria, unspecified: Secondary | ICD-10-CM

## 2014-10-02 DIAGNOSIS — G5692 Unspecified mononeuropathy of left upper limb: Secondary | ICD-10-CM | POA: Diagnosis not present

## 2014-10-02 MED ORDER — TRIAMCINOLONE ACETONIDE 0.1 % EX CREA: 1.0000 "application " | TOPICAL_CREAM | Freq: Three times a day (TID) | CUTANEOUS | Status: DC

## 2014-10-02 MED ORDER — METHYLPREDNISOLONE ACETATE 80 MG/ML IJ SUSP
80.0000 mg | Freq: Once | INTRAMUSCULAR | Status: AC
  Administered 2014-10-02: 80 mg via INTRAMUSCULAR

## 2014-10-02 NOTE — Patient Instructions (Signed)
Call in a month to consider gabapentin therapy for hand neuropathy. Take Claritin daily. Triamcinolone cream for hives. Depo-Medrol given.

## 2014-10-02 NOTE — Telephone Encounter (Signed)
Need to see rash

## 2014-10-02 NOTE — Progress Notes (Addendum)
   Subjective:    Patient ID: LEENAH SEIDNER, female    DOB: 02/13/1941, 74 y.o.   MRN: [id number]  HPI  Patient here today with rash which she says has been bothering her now for several weeks. When I last saw her she had been on prednisone for rheumatologist. Rash had resolved. Now appears today with urticaria affecting left arm and upper back. It is itchy. Has changed her soap recently to safeguard but no change in detergents. Does eat chocolate and peanuts from time to time. Previously not aware of any food intolerances causing hives. No recent illnesses. No new medications. History of rheumatoid arthritis.  Also, patient brings up a new issue today. Complaining of numbness and tingling in her first 3 fingers of left ANP. Apparently this is been going on since her shoulder surgery September 2015.She mentioned this to Dr. Onnie Graham. Apparently had nerve conduction studies which she reports as being normal.    Review of Systems     Objective:   Physical Exam  Papular erythematous lesions that are consistent with hives/urticaria      Assessment & Plan:  Hives-to take Claritin 10 mg daily. Use triamcinolone cream on 5 lesions 3 times daily as needed. Depo-Medrol 80 mg IM given.  Numbness first 3 fingers left hand-hand neuropathy status post shoulder surgery  Plan: Offered gabapentin for numbness in fingers however after discussion we have decided to hold off on this treatment. She will take Claritin 10 mg at bedtime for several weeks. Given Depo-Medrol 80 mg IM today. Hopefully hives will resolve soon. Admit she's been worried about her daughter's health. Apparently daughter has COPD and is on oxygen therapy.  Addendum: Have received records from Dr. Susie Cassette office. 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 No evidence of left ulnar neuropathy at the rest or the elbow.  No evidence of left upper limb cervical radiculopathy. She was offered chance to see hand surgeon but declined. Nerve conduction studies were done November 2015. Last seen by Dr. Onnie Graham 09/27/2014. Do not know if occupational therapy would be of help but hand consultation with Dr. Amedeo Plenty would be advised.

## 2014-10-02 NOTE — Telephone Encounter (Signed)
Pt's rash is spreading across back and is itching "pretty bad". Please advise.  Best number to call pt is [phone] / lt

## 2014-10-05 ENCOUNTER — Ambulatory Visit: Payer: Self-pay | Admitting: Internal Medicine

## 2014-10-09 DIAGNOSIS — Z1231 Encounter for screening mammogram for malignant neoplasm of breast: Secondary | ICD-10-CM | POA: Diagnosis not present

## 2014-10-30 DIAGNOSIS — N812 Incomplete uterovaginal prolapse: Secondary | ICD-10-CM | POA: Diagnosis not present

## 2014-10-30 DIAGNOSIS — N8111 Cystocele, midline: Secondary | ICD-10-CM | POA: Diagnosis not present

## 2014-10-30 DIAGNOSIS — N816 Rectocele: Secondary | ICD-10-CM | POA: Diagnosis not present

## 2014-11-01 ENCOUNTER — Encounter: Payer: Self-pay | Admitting: Internal Medicine

## 2014-12-14 ENCOUNTER — Other Ambulatory Visit: Payer: Self-pay | Admitting: Internal Medicine

## 2014-12-15 ENCOUNTER — Other Ambulatory Visit: Payer: Self-pay | Admitting: Internal Medicine

## 2014-12-25 ENCOUNTER — Other Ambulatory Visit (HOSPITAL_COMMUNITY): Payer: Self-pay | Admitting: Obstetrics and Gynecology

## 2014-12-25 NOTE — H&P (Signed)
ANAGABRIELA JOKERST  DICTATION # [id number] CSN# [id number]   Margarette Asal, MD 12/25/2014 11:42 AM

## 2014-12-26 ENCOUNTER — Other Ambulatory Visit (HOSPITAL_COMMUNITY): Payer: Self-pay | Admitting: Obstetrics and Gynecology

## 2014-12-26 NOTE — H&P (Signed)
[name], [name]               ACCOUNT NO.:  [id number]  MEDICAL RECORD NO.:  [id number]  LOCATION:                                FACILITY:  [facility]  PHYSICIAN:  Ralene Bathe. Matthew Saras, M.D.DATE OF BIRTH:  1940/07/28  DATE OF ADMISSION:  01/08/2015 DATE OF DISCHARGE:                             HISTORY & PHYSICAL   CHIEF COMPLAINT:  Symptomatic uterine prolapse.  HPI:  A 74 year old postmenopausal patient, G9, P9, was evaluated June of this year with complaints of sensation that something was falling out, increased pelvic pressure especially when she was on her feet.  No history of __________.  On exam, she did have moderate descensus and with cystocele and rectocele.  She presents now for definitive LAVH-BSO, A and P repair, possible SSLS.  This procedure including specific risks related to bleeding, infection, transfusion, adjacent organ injury, possible need for open surgery along with other risks related to wound infection, phlebitis, and her expected recovery time reviewed.  We had previously discussed other options such as pessary which she declines.  PAST MEDICAL HISTORY:  CURRENT MEDICINES:  Lisinopril, omeprazole, baby aspirin daily, triamterene/HCTZ, simvastatin.  She has had 9 vaginal deliveries.  REVIEW OF SYSTEMS:  Significant for history of osteoarthritis.  FAMILY HISTORY:  Otherwise negative.  SOCIAL HISTORY:  She is widowed.  She denies drugs, tobacco, or alcohol use.  PHYSICAL EXAMINATION:  VITAL SIGNS:  Temp 98.2, blood pressure 140/68. HEENT:  Unremarkable. NECK:  Supple without masses. LUNGS:  Clear. CARDIOVASCULAR:  Regular rate and rhythm without murmurs, rubs, or gallops. BREASTS:  Without masses. ABDOMEN:  Soft, flat, nontender.  Vulva is unremarkable.  On straining, there is moderate uterine descensus.  The uterus was otherwise normal size.  There was a small anterior and posterior cystocele and rectocele. Bimanual otherwise  unremarkable. EXTREMITIES:  Unremarkable. NEUROLOGIC:  Unremarkable.  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  PLAN:  LAVH-BSO, A and P repair, possible SSLS.  Procedure and risks discussed as above.     Tajee Savant M. Matthew Saras, M.D.     [facility]/MEDQ  D:  12/25/2014  T:  12/26/2014  Job:  [id number]

## 2015-01-01 ENCOUNTER — Encounter (HOSPITAL_COMMUNITY): Payer: Self-pay

## 2015-01-01 ENCOUNTER — Encounter (HOSPITAL_COMMUNITY)
Admission: RE | Admit: 2015-01-01 | Discharge: 2015-01-01 | Disposition: A | Discharge status: Home / Self Care | Payer: Medicare Other | Source: Ambulatory Visit | Attending: Obstetrics and Gynecology | Admitting: Obstetrics and Gynecology

## 2015-01-01 DIAGNOSIS — Z01818 Encounter for other preprocedural examination: Secondary | ICD-10-CM | POA: Insufficient documentation

## 2015-01-01 DIAGNOSIS — N816 Rectocele: Secondary | ICD-10-CM | POA: Insufficient documentation

## 2015-01-01 DIAGNOSIS — N8110 Cystocele, unspecified: Secondary | ICD-10-CM | POA: Insufficient documentation

## 2015-01-01 DIAGNOSIS — N8189 Other female genital prolapse: Secondary | ICD-10-CM | POA: Insufficient documentation

## 2015-01-01 LAB — CBC
HCT: 35.4 % — ABNORMAL LOW (ref 36.0–46.0)
Hemoglobin: 11.6 g/dL — ABNORMAL LOW (ref 12.0–15.0)
MCH: 33 pg (ref 26.0–34.0)
MCHC: 32.8 g/dL (ref 30.0–36.0)
MCV: 100.9 fL — ABNORMAL HIGH (ref 78.0–100.0)
Platelets: 559 10*3/uL — ABNORMAL HIGH (ref 150–400)
RBC: 3.51 MIL/uL — ABNORMAL LOW (ref 3.87–5.11)
RDW: 15 % (ref 11.5–15.5)
WBC: 6.7 10*3/uL (ref 4.0–10.5)

## 2015-01-01 LAB — BASIC METABOLIC PANEL
Anion gap: 9 (ref 5–15)
BUN: 37 mg/dL — ABNORMAL HIGH (ref 6–20)
CO2: 26 mmol/L (ref 22–32)
Calcium: 9.6 mg/dL (ref 8.9–10.3)
Chloride: 101 mmol/L (ref 101–111)
Creatinine, Ser: 1.09 mg/dL — ABNORMAL HIGH (ref 0.44–1.00)
GFR calc Af Amer: 56 mL/min — ABNORMAL LOW (ref >60)
GFR calc non Af Amer: 48 mL/min — ABNORMAL LOW (ref >60)
Glucose, Bld: 88 mg/dL (ref 65–99)
Potassium: 3.8 mmol/L (ref 3.5–5.1)
Sodium: 136 mmol/L (ref 135–145)

## 2015-01-01 LAB — TYPE AND SCREEN
ABO/RH(D): A POS
Antibody Screen: NEGATIVE

## 2015-01-01 LAB — ABO/RH: ABO/RH(D): A POS

## 2015-01-01 NOTE — Patient Instructions (Addendum)
Your procedure is scheduled on:  January 08, 2015   Enter through the Main Entrance of [facility] at:  6:00 am    Pick up the phone at the desk and dial [phone].  Call this number if you have problems the morning of surgery: [phone].  Remember: Do NOT eat food:  After midnight on Sunday night  Do NOT drink clear liquids after:  Midnight on Sunday night  Take these medicines the morning of surgery with a SIP OF WATER:  Lisinopril, prilosec and maxide   Do NOT wear jewelry (body piercing), metal hair clips/bobby pins, make-up, or nail polish. Do NOT wear lotions, powders, or perfumes.  You may wear deoderant. Do NOT shave for 48 hours prior to surgery. Do NOT bring valuables to the hospital. Contacts, dentures, or bridgework may not be worn into surgery. Leave suitcase in car.  After surgery it may be brought to your room.  For patients admitted to the hospital, checkout time is 11:00 AM the day of discharge.

## 2015-01-04 DIAGNOSIS — N8182 Incompetence or weakening of pubocervical tissue: Secondary | ICD-10-CM | POA: Diagnosis not present

## 2015-01-04 DIAGNOSIS — H1013 Acute atopic conjunctivitis, bilateral: Secondary | ICD-10-CM | POA: Diagnosis not present

## 2015-01-04 DIAGNOSIS — H402230 Chronic angle-closure glaucoma, bilateral, stage unspecified: Secondary | ICD-10-CM | POA: Diagnosis not present

## 2015-01-07 MED ORDER — DEXTROSE 5 % IV SOLN
2.0000 g | INTRAVENOUS | Status: AC
  Administered 2015-01-08: 2 g via INTRAVENOUS
  Filled 2015-01-07: qty 2

## 2015-01-08 ENCOUNTER — Observation Stay (HOSPITAL_COMMUNITY)
Admission: RE | Admit: 2015-01-08 | Discharge: 2015-01-09 | Disposition: A | Discharge status: Home / Self Care | Payer: Medicare Other | Source: Ambulatory Visit | Attending: Obstetrics and Gynecology | Admitting: Obstetrics and Gynecology

## 2015-01-08 ENCOUNTER — Encounter (HOSPITAL_COMMUNITY): Payer: Self-pay | Admitting: Certified Registered Nurse Anesthetist

## 2015-01-08 ENCOUNTER — Inpatient Hospital Stay (HOSPITAL_COMMUNITY): Payer: Medicare Other | Admitting: Certified Registered Nurse Anesthetist

## 2015-01-08 ENCOUNTER — Encounter (HOSPITAL_COMMUNITY)
Admission: RE | Disposition: A | Discharge status: Home / Self Care | Payer: Self-pay | Source: Ambulatory Visit | Attending: Obstetrics and Gynecology

## 2015-01-08 DIAGNOSIS — E780 Pure hypercholesterolemia: Secondary | ICD-10-CM | POA: Diagnosis not present

## 2015-01-08 DIAGNOSIS — N814 Uterovaginal prolapse, unspecified: Secondary | ICD-10-CM | POA: Diagnosis not present

## 2015-01-08 DIAGNOSIS — Z7982 Long term (current) use of aspirin: Secondary | ICD-10-CM | POA: Insufficient documentation

## 2015-01-08 DIAGNOSIS — K589 Irritable bowel syndrome without diarrhea: Secondary | ICD-10-CM | POA: Insufficient documentation

## 2015-01-08 DIAGNOSIS — I10 Essential (primary) hypertension: Secondary | ICD-10-CM | POA: Insufficient documentation

## 2015-01-08 DIAGNOSIS — M069 Rheumatoid arthritis, unspecified: Secondary | ICD-10-CM | POA: Diagnosis not present

## 2015-01-08 DIAGNOSIS — M4802 Spinal stenosis, cervical region: Secondary | ICD-10-CM | POA: Diagnosis not present

## 2015-01-08 DIAGNOSIS — N8110 Cystocele, unspecified: Secondary | ICD-10-CM | POA: Diagnosis not present

## 2015-01-08 DIAGNOSIS — D252 Subserosal leiomyoma of uterus: Secondary | ICD-10-CM | POA: Diagnosis not present

## 2015-01-08 DIAGNOSIS — I340 Nonrheumatic mitral (valve) insufficiency: Secondary | ICD-10-CM | POA: Diagnosis not present

## 2015-01-08 DIAGNOSIS — Z86711 Personal history of pulmonary embolism: Secondary | ICD-10-CM | POA: Diagnosis not present

## 2015-01-08 DIAGNOSIS — Z91040 Latex allergy status: Secondary | ICD-10-CM | POA: Diagnosis not present

## 2015-01-08 DIAGNOSIS — N812 Incomplete uterovaginal prolapse: Secondary | ICD-10-CM | POA: Insufficient documentation

## 2015-01-08 DIAGNOSIS — Z880 Allergy status to penicillin: Secondary | ICD-10-CM | POA: Insufficient documentation

## 2015-01-08 DIAGNOSIS — N816 Rectocele: Secondary | ICD-10-CM | POA: Diagnosis not present

## 2015-01-08 DIAGNOSIS — N8189 Other female genital prolapse: Secondary | ICD-10-CM | POA: Diagnosis not present

## 2015-01-08 DIAGNOSIS — N888 Other specified noninflammatory disorders of cervix uteri: Secondary | ICD-10-CM | POA: Diagnosis not present

## 2015-01-08 DIAGNOSIS — D251 Intramural leiomyoma of uterus: Principal | ICD-10-CM | POA: Insufficient documentation

## 2015-01-08 DIAGNOSIS — M1990 Unspecified osteoarthritis, unspecified site: Secondary | ICD-10-CM | POA: Diagnosis not present

## 2015-01-08 DIAGNOSIS — Z79899 Other long term (current) drug therapy: Secondary | ICD-10-CM | POA: Insufficient documentation

## 2015-01-08 DIAGNOSIS — K219 Gastro-esophageal reflux disease without esophagitis: Secondary | ICD-10-CM | POA: Insufficient documentation

## 2015-01-08 HISTORY — PX: ANTERIOR AND POSTERIOR REPAIR WITH SACROSPINOUS FIXATION: SHX6536

## 2015-01-08 HISTORY — PX: LAPAROSCOPIC ASSISTED VAGINAL HYSTERECTOMY: SHX5398

## 2015-01-08 SURGERY — HYSTERECTOMY, VAGINAL, LAPAROSCOPY-ASSISTED
Anesthesia: General

## 2015-01-08 MED ORDER — KETOROLAC TROMETHAMINE 30 MG/ML IJ SOLN: 30.0000 mg | Freq: Once | INTRAMUSCULAR | Status: DC | PRN

## 2015-01-08 MED ORDER — MORPHINE SULFATE 1 MG/ML IV SOLN
INTRAVENOUS | Status: DC
  Administered 2015-01-08: 11:00:00 via INTRAVENOUS
  Administered 2015-01-08: 1 mg via INTRAVENOUS
  Filled 2015-01-08: qty 25

## 2015-01-08 MED ORDER — PROPOFOL 10 MG/ML IV BOLUS
INTRAVENOUS | Status: AC
  Filled 2015-01-08: qty 20

## 2015-01-08 MED ORDER — GLYCOPYRROLATE 0.2 MG/ML IJ SOLN
INTRAMUSCULAR | Status: DC | PRN
  Administered 2015-01-08: 0.6 mg via INTRAVENOUS

## 2015-01-08 MED ORDER — IBUPROFEN 800 MG PO TABS: 800.0000 mg | ORAL_TABLET | Freq: Three times a day (TID) | ORAL | Status: DC | PRN

## 2015-01-08 MED ORDER — NEOSTIGMINE METHYLSULFATE 10 MG/10ML IV SOLN
INTRAVENOUS | Status: DC | PRN
  Administered 2015-01-08: 4 mg via INTRAVENOUS

## 2015-01-08 MED ORDER — KETOROLAC TROMETHAMINE 30 MG/ML IJ SOLN: 30.0000 mg | Freq: Four times a day (QID) | INTRAMUSCULAR | Status: DC

## 2015-01-08 MED ORDER — PROPOFOL 10 MG/ML IV BOLUS
INTRAVENOUS | Status: DC | PRN
  Administered 2015-01-08: 120 mg via INTRAVENOUS
  Administered 2015-01-08: 20 mg via INTRAVENOUS

## 2015-01-08 MED ORDER — FENTANYL CITRATE (PF) 100 MCG/2ML IJ SOLN
INTRAMUSCULAR | Status: DC | PRN
  Administered 2015-01-08: 25 ug via INTRAVENOUS
  Administered 2015-01-08: 75 ug via INTRAVENOUS
  Administered 2015-01-08: 25 ug via INTRAVENOUS
  Administered 2015-01-08: 75 ug via INTRAVENOUS

## 2015-01-08 MED ORDER — HYDROMORPHONE HCL 1 MG/ML IJ SOLN
0.2500 mg | INTRAMUSCULAR | Status: DC | PRN
  Administered 2015-01-08 (×2): 0.25 mg via INTRAVENOUS

## 2015-01-08 MED ORDER — BUPIVACAINE HCL (PF) 0.25 % IJ SOLN
INTRAMUSCULAR | Status: AC
  Filled 2015-01-08: qty 30

## 2015-01-08 MED ORDER — OLOPATADINE HCL 0.1 % OP SOLN
1.0000 [drp] | Freq: Two times a day (BID) | OPHTHALMIC | Status: DC
  Filled 2015-01-08: qty 5

## 2015-01-08 MED ORDER — PANTOPRAZOLE SODIUM 40 MG PO TBEC
40.0000 mg | DELAYED_RELEASE_TABLET | Freq: Every day | ORAL | Status: DC
  Administered 2015-01-09: 40 mg via ORAL
  Filled 2015-01-08: qty 1

## 2015-01-08 MED ORDER — ONDANSETRON HCL 4 MG/2ML IJ SOLN
4.0000 mg | Freq: Four times a day (QID) | INTRAMUSCULAR | Status: DC | PRN
  Administered 2015-01-08: 4 mg via INTRAVENOUS
  Filled 2015-01-08: qty 2

## 2015-01-08 MED ORDER — DIPHENHYDRAMINE HCL 50 MG/ML IJ SOLN: 12.5000 mg | Freq: Four times a day (QID) | INTRAMUSCULAR | Status: DC | PRN

## 2015-01-08 MED ORDER — PHENYLEPHRINE 40 MCG/ML (10ML) SYRINGE FOR IV PUSH (FOR BLOOD PRESSURE SUPPORT)
PREFILLED_SYRINGE | INTRAVENOUS | Status: AC
  Filled 2015-01-08: qty 10

## 2015-01-08 MED ORDER — NEOSTIGMINE METHYLSULFATE 10 MG/10ML IV SOLN
INTRAVENOUS | Status: AC
  Filled 2015-01-08: qty 1

## 2015-01-08 MED ORDER — SCOPOLAMINE 1 MG/3DAYS TD PT72: 1.0000 | MEDICATED_PATCH | Freq: Once | TRANSDERMAL | Status: DC

## 2015-01-08 MED ORDER — KETOROLAC TROMETHAMINE 15 MG/ML IJ SOLN
15.0000 mg | Freq: Four times a day (QID) | INTRAMUSCULAR | Status: DC
  Administered 2015-01-08 – 2015-01-09 (×5): 15 mg via INTRAVENOUS
  Filled 2015-01-08 (×9): qty 1

## 2015-01-08 MED ORDER — PROMETHAZINE HCL 25 MG/ML IJ SOLN
6.2500 mg | INTRAMUSCULAR | Status: DC | PRN
Start: 2015-01-08 — End: 2015-01-08

## 2015-01-08 MED ORDER — KETOROLAC TROMETHAMINE 30 MG/ML IJ SOLN: 15.0000 mg | Freq: Four times a day (QID) | INTRAMUSCULAR | Status: DC

## 2015-01-08 MED ORDER — MIDAZOLAM HCL 2 MG/2ML IJ SOLN
INTRAMUSCULAR | Status: DC | PRN
  Administered 2015-01-08: 0.5 mg via INTRAVENOUS

## 2015-01-08 MED ORDER — ACETAMINOPHEN 10 MG/ML IV SOLN
1000.0000 mg | Freq: Once | INTRAVENOUS | Status: AC
  Administered 2015-01-08: 1000 mg via INTRAVENOUS
  Filled 2015-01-08: qty 100

## 2015-01-08 MED ORDER — ONDANSETRON HCL 4 MG/2ML IJ SOLN: 4.0000 mg | Freq: Four times a day (QID) | INTRAMUSCULAR | Status: DC | PRN

## 2015-01-08 MED ORDER — OXYCODONE-ACETAMINOPHEN 5-325 MG PO TABS: 1.0000 | ORAL_TABLET | ORAL | Status: DC | PRN

## 2015-01-08 MED ORDER — ENOXAPARIN SODIUM 40 MG/0.4ML ~~LOC~~ SOLN
40.0000 mg | SUBCUTANEOUS | Status: DC
  Administered 2015-01-09: 40 mg via SUBCUTANEOUS
  Filled 2015-01-08 (×2): qty 0.4

## 2015-01-08 MED ORDER — PHENYLEPHRINE HCL 10 MG/ML IJ SOLN
INTRAMUSCULAR | Status: DC | PRN
  Administered 2015-01-08 (×2): .04 mg via INTRAVENOUS
  Administered 2015-01-08: .08 mg via INTRAVENOUS

## 2015-01-08 MED ORDER — ONDANSETRON HCL 4 MG PO TABS: 4.0000 mg | ORAL_TABLET | Freq: Four times a day (QID) | ORAL | Status: DC | PRN

## 2015-01-08 MED ORDER — ESTRADIOL 0.1 MG/GM VA CREA
TOPICAL_CREAM | VAGINAL | Status: DC | PRN
  Administered 2015-01-08: 1 via VAGINAL

## 2015-01-08 MED ORDER — DEXAMETHASONE SODIUM PHOSPHATE 4 MG/ML IJ SOLN
INTRAMUSCULAR | Status: AC
  Filled 2015-01-08: qty 1

## 2015-01-08 MED ORDER — ONDANSETRON HCL 4 MG/2ML IJ SOLN
INTRAMUSCULAR | Status: DC | PRN
  Administered 2015-01-08: 4 mg via INTRAVENOUS

## 2015-01-08 MED ORDER — KETOROLAC TROMETHAMINE 30 MG/ML IJ SOLN: 30.0000 mg | Freq: Once | INTRAMUSCULAR | Status: DC

## 2015-01-08 MED ORDER — BUTORPHANOL TARTRATE 1 MG/ML IJ SOLN: 1.0000 mg | INTRAMUSCULAR | Status: DC | PRN

## 2015-01-08 MED ORDER — ROCURONIUM BROMIDE 100 MG/10ML IV SOLN
INTRAVENOUS | Status: DC | PRN
  Administered 2015-01-08: 50 mg via INTRAVENOUS

## 2015-01-08 MED ORDER — LIDOCAINE HCL (CARDIAC) 20 MG/ML IV SOLN
INTRAVENOUS | Status: AC
  Filled 2015-01-08: qty 5

## 2015-01-08 MED ORDER — KETOROLAC TROMETHAMINE 30 MG/ML IJ SOLN
15.0000 mg | Freq: Four times a day (QID) | INTRAMUSCULAR | Status: DC
Start: 2015-01-08 — End: 2015-01-08

## 2015-01-08 MED ORDER — DEXAMETHASONE SODIUM PHOSPHATE 10 MG/ML IJ SOLN
INTRAMUSCULAR | Status: DC | PRN
  Administered 2015-01-08: 4 mg via INTRAVENOUS

## 2015-01-08 MED ORDER — SODIUM CHLORIDE 0.9 % IJ SOLN: 9.0000 mL | INTRAMUSCULAR | Status: DC | PRN

## 2015-01-08 MED ORDER — LACTATED RINGERS IV SOLN
INTRAVENOUS | Status: DC
  Administered 2015-01-08 (×2): via INTRAVENOUS

## 2015-01-08 MED ORDER — MIDAZOLAM HCL 2 MG/2ML IJ SOLN
INTRAMUSCULAR | Status: AC
  Filled 2015-01-08: qty 4

## 2015-01-08 MED ORDER — SIMVASTATIN 20 MG PO TABS
20.0000 mg | ORAL_TABLET | Freq: Every day | ORAL | Status: DC
  Administered 2015-01-08: 20 mg via ORAL
  Filled 2015-01-08 (×3): qty 1

## 2015-01-08 MED ORDER — HYDROMORPHONE HCL 1 MG/ML IJ SOLN
INTRAMUSCULAR | Status: AC
  Administered 2015-01-08: 0.25 mg via INTRAVENOUS
  Filled 2015-01-08: qty 1

## 2015-01-08 MED ORDER — LIDOCAINE-EPINEPHRINE (PF) 1 %-1:200000 IJ SOLN
INTRAMUSCULAR | Status: AC
  Filled 2015-01-08: qty 30

## 2015-01-08 MED ORDER — ESTRADIOL 0.1 MG/GM VA CREA
TOPICAL_CREAM | VAGINAL | Status: AC
  Filled 2015-01-08: qty 42.5

## 2015-01-08 MED ORDER — ROCURONIUM BROMIDE 100 MG/10ML IV SOLN
INTRAVENOUS | Status: AC
  Filled 2015-01-08: qty 1

## 2015-01-08 MED ORDER — DIPHENHYDRAMINE HCL 12.5 MG/5ML PO ELIX: 12.5000 mg | ORAL_SOLUTION | Freq: Four times a day (QID) | ORAL | Status: DC | PRN

## 2015-01-08 MED ORDER — DEXTROSE IN LACTATED RINGERS 5 % IV SOLN
INTRAVENOUS | Status: DC
  Administered 2015-01-08 – 2015-01-09 (×3): via INTRAVENOUS

## 2015-01-08 MED ORDER — NALOXONE HCL 0.4 MG/ML IJ SOLN: 0.4000 mg | INTRAMUSCULAR | Status: DC | PRN

## 2015-01-08 MED ORDER — LIDOCAINE-EPINEPHRINE (PF) 1 %-1:200000 IJ SOLN
INTRAMUSCULAR | Status: DC | PRN
Start: 2015-01-08 — End: 2015-01-08
  Administered 2015-01-08: 9 mL

## 2015-01-08 MED ORDER — MENTHOL 3 MG MT LOZG: 1.0000 | LOZENGE | OROMUCOSAL | Status: DC | PRN

## 2015-01-08 MED ORDER — ONDANSETRON HCL 4 MG/2ML IJ SOLN
INTRAMUSCULAR | Status: AC
  Filled 2015-01-08: qty 2

## 2015-01-08 MED ORDER — LISINOPRIL 5 MG PO TABS
5.0000 mg | ORAL_TABLET | Freq: Every day | ORAL | Status: DC
  Filled 2015-01-08 (×3): qty 1

## 2015-01-08 MED ORDER — TRIAMTERENE-HCTZ 75-50 MG PO TABS
1.0000 | ORAL_TABLET | Freq: Every day | ORAL | Status: DC
  Filled 2015-01-08 (×3): qty 1

## 2015-01-08 MED ORDER — LIDOCAINE HCL (CARDIAC) 20 MG/ML IV SOLN
INTRAVENOUS | Status: DC | PRN
  Administered 2015-01-08: 80 mg via INTRAVENOUS

## 2015-01-08 MED ORDER — KETOROLAC TROMETHAMINE 15 MG/ML IJ SOLN
15.0000 mg | Freq: Four times a day (QID) | INTRAMUSCULAR | Status: DC
  Filled 2015-01-08 (×5): qty 1

## 2015-01-08 MED ORDER — FENTANYL CITRATE (PF) 250 MCG/5ML IJ SOLN
INTRAMUSCULAR | Status: AC
  Filled 2015-01-08: qty 25

## 2015-01-08 MED ORDER — MEPERIDINE HCL 25 MG/ML IJ SOLN: 6.2500 mg | INTRAMUSCULAR | Status: DC | PRN

## 2015-01-08 MED ORDER — BUPIVACAINE HCL (PF) 0.25 % IJ SOLN
INTRAMUSCULAR | Status: DC | PRN
  Administered 2015-01-08: 8 mL

## 2015-01-08 MED ORDER — GLYCOPYRROLATE 0.2 MG/ML IJ SOLN
INTRAMUSCULAR | Status: AC
  Filled 2015-01-08: qty 3

## 2015-01-08 SURGICAL SUPPLY — 57 items
BLADE SURG 11 STRL SS (BLADE) ×2 IMPLANT
BLADE SURG 15 STRL LF C SS BP (BLADE) IMPLANT
BLADE SURG 15 STRL SS (BLADE)
CABLE HIGH FREQUENCY MONO STRZ (ELECTRODE) IMPLANT
CATH ROBINSON RED A/P 16FR (CATHETERS) ×2 IMPLANT
CLOTH BEACON ORANGE TIMEOUT ST (SAFETY) ×2 IMPLANT
CONT PATH 16OZ SNAP LID 3702 (MISCELLANEOUS) ×2 IMPLANT
COVER BACK TABLE 60X90IN (DRAPES) ×2 IMPLANT
DECANTER SPIKE VIAL GLASS SM (MISCELLANEOUS) ×2 IMPLANT
DEVICE CAPIO SLIM SINGLE (INSTRUMENTS) ×1 IMPLANT
DRSG COVADERM PLUS 2X2 (GAUZE/BANDAGES/DRESSINGS) ×4 IMPLANT
DRSG OPSITE POSTOP 3X4 (GAUZE/BANDAGES/DRESSINGS) ×1 IMPLANT
DURAPREP 26ML APPLICATOR (WOUND CARE) ×2 IMPLANT
ELECT LIGASURE SHORT 9 REUSE (ELECTRODE) ×2 IMPLANT
ELECT REM PT RETURN 9FT ADLT (ELECTROSURGICAL) ×2
ELECTRODE REM PT RTRN 9FT ADLT (ELECTROSURGICAL) ×1 IMPLANT
GAUZE PACKING 2X5 YD STRL (GAUZE/BANDAGES/DRESSINGS) IMPLANT
GLOVE BIO SURGEON STRL SZ7 (GLOVE) ×4 IMPLANT
GLOVE BIOGEL PI IND STRL 6.5 (GLOVE) ×1 IMPLANT
GLOVE BIOGEL PI IND STRL 7.0 (GLOVE) ×2 IMPLANT
GLOVE BIOGEL PI INDICATOR 6.5 (GLOVE) ×1
GLOVE BIOGEL PI INDICATOR 7.0 (GLOVE) ×2
GOWN STRL REUS W/TWL LRG LVL3 (GOWN DISPOSABLE) ×8 IMPLANT
LIQUID BAND (GAUZE/BANDAGES/DRESSINGS) ×4 IMPLANT
NDL MAYO 6 CRC TAPER PT (NEEDLE) IMPLANT
NEEDLE HYPO 22GX1.5 SAFETY (NEEDLE) IMPLANT
NEEDLE INSUFFLATION 120MM (ENDOMECHANICALS) ×2 IMPLANT
NEEDLE MAYO 6 CRC TAPER PT (NEEDLE) IMPLANT
NS IRRIG 1000ML POUR BTL (IV SOLUTION) ×2 IMPLANT
PACK LAVH (CUSTOM PROCEDURE TRAY) ×2 IMPLANT
PACK ROBOTIC GOWN (GOWN DISPOSABLE) ×2 IMPLANT
PACK VAGINAL WOMENS (CUSTOM PROCEDURE TRAY) ×2 IMPLANT
PAD POSITIONING PINK XL (MISCELLANEOUS) ×2 IMPLANT
SEALER TISSUE G2 CVD JAW 45CM (ENDOMECHANICALS) ×2 IMPLANT
SET CYSTO W/LG BORE CLAMP LF (SET/KITS/TRAYS/PACK) ×2 IMPLANT
SET IRRIG TUBING LAPAROSCOPIC (IRRIGATION / IRRIGATOR) IMPLANT
SUT CAPIO POLYGLYCOLIC (SUTURE) ×1 IMPLANT
SUT MON AB 2-0 CT1 36 (SUTURE) ×4 IMPLANT
SUT MON AB 3-0 SH 27 (SUTURE)
SUT MON AB 3-0 SH27 (SUTURE) IMPLANT
SUT VIC AB 0 CT1 18XCR BRD8 (SUTURE) ×3 IMPLANT
SUT VIC AB 0 CT1 36 (SUTURE) ×3 IMPLANT
SUT VIC AB 0 CT1 8-18 (SUTURE) ×6
SUT VIC AB 2-0 CT1 27 (SUTURE)
SUT VIC AB 2-0 CT1 TAPERPNT 27 (SUTURE) IMPLANT
SUT VIC AB 2-0 CT2 27 (SUTURE) ×9 IMPLANT
SUT VIC AB 2-0 UR6 27 (SUTURE) ×1 IMPLANT
SUT VICRYL 0 TIES 12 18 (SUTURE) ×2 IMPLANT
SUT VICRYL 4-0 PS2 18IN ABS (SUTURE) ×2 IMPLANT
SUT VICRYL RAPIDE 3 0 (SUTURE) ×2 IMPLANT
SUT VICRYL RAPIDE 3-0 36IN (SUTURE) ×3 IMPLANT
TOWEL OR 17X24 6PK STRL BLUE (TOWEL DISPOSABLE) ×4 IMPLANT
TRAY FOLEY CATH SILVER 14FR (SET/KITS/TRAYS/PACK) ×2 IMPLANT
TROCAR OPTI TIP 5M 100M (ENDOMECHANICALS) ×2 IMPLANT
TROCAR XCEL DIL TIP R 11M (ENDOMECHANICALS) ×2 IMPLANT
WARMER LAPAROSCOPE (MISCELLANEOUS) ×2 IMPLANT
WATER STERILE IRR 1000ML POUR (IV SOLUTION) ×2 IMPLANT

## 2015-01-08 NOTE — Transfer of Care (Signed)
Immediate Anesthesia Transfer of Care Note  Patient: [name]  Procedure(s) Performed: Procedure(s): LAPAROSCOPIC ASSISTED VAGINAL HYSTERECTOMY (N/A) ANTERIOR AND POSTERIOR REPAIR WITH possible SACROSPINOUS FIXATION (N/A)  Patient Location: PACU  Anesthesia Type:General  Level of Consciousness: awake, alert , oriented and patient cooperative  Airway & Oxygen Therapy: Patient Spontanous Breathing and Patient connected to nasal cannula oxygen  Post-op Assessment: Report given to RN and Post -op Vital signs reviewed and stable  Post vital signs: Reviewed and stable  Last Vitals:  Filed Vitals:   01/08/15 0601  BP: 136/63  Pulse: 75  Temp: 36.7 C  Resp: 20    Complications: No apparent anesthesia complications

## 2015-01-08 NOTE — Anesthesia Postprocedure Evaluation (Signed)
  Anesthesia Post-op Note  Patient: [name]  Procedure(s) Performed: Procedure(s): LAPAROSCOPIC ASSISTED VAGINAL HYSTERECTOMY (N/A) ANTERIOR AND POSTERIOR REPAIR WITH possible SACROSPINOUS FIXATION (N/A)  Patient Location: PACU and Women's Unit  Anesthesia Type:General  Level of Consciousness: awake, alert  and oriented  Airway and Oxygen Therapy: Patient Spontanous Breathing  Post-op Pain: mild  Post-op Assessment: Post-op Vital signs reviewed              Post-op Vital Signs: Reviewed and stable  Last Vitals:  Filed Vitals:   01/08/15 1439  BP: 98/61  Pulse: 74  Temp: 36.4 C  Resp: 12    Complications: No apparent anesthesia complications

## 2015-01-08 NOTE — Anesthesia Preprocedure Evaluation (Signed)
Anesthesia Evaluation  Patient identified by MRN, date of birth, ID band Patient awake    Reviewed: Allergy & Precautions, H&P , NPO status , Patient's Chart, lab work & pertinent test results, reviewed documented beta blocker date and time   Airway Mallampati: II  TM Distance: >3 FB Neck ROM: full    Dental  (+) Teeth Intact   Pulmonary neg pulmonary ROS,    Pulmonary exam normal       Cardiovascular hypertension, Pt. on medications Normal cardiovascular exam    Neuro/Psych negative neurological ROS  negative psych ROS   GI/Hepatic Neg liver ROS, GERD-  Medicated and Controlled,  Endo/Other    Renal/GU negative Renal ROS     Musculoskeletal   Abdominal Normal abdominal exam  (+)   Peds  Hematology negative hematology ROS (+)   Anesthesia Other Findings   Reproductive/Obstetrics negative OB ROS                             Anesthesia Physical Anesthesia Plan  ASA: II  Anesthesia Plan: General   Post-op Pain Management:    Induction: Intravenous  Airway Management Planned: Oral ETT  Additional Equipment:   Intra-op Plan:   Post-operative Plan: Extubation in OR  Informed Consent: I have reviewed the patients History and Physical, chart, labs and discussed the procedure including the risks, benefits and alternatives for the proposed anesthesia with the patient or authorized representative who has indicated his/her understanding and acceptance.   Dental Advisory Given  Plan Discussed with: CRNA and Surgeon  Anesthesia Plan Comments:         Anesthesia Quick Evaluation

## 2015-01-08 NOTE — Addendum Note (Signed)
Addendum  created 01/08/15 1527 by Genevie Ann, CRNA   Modules edited: Notes Section   Notes Section:  File: [id number]

## 2015-01-08 NOTE — Op Note (Signed)
Preoperative diagnosis: Symptomatic uterine prolapse, cystocele and rectocele  Postoperative diagnosis: Same  Procedure: LAVH, BSO, anterior and posterior colporrhaphy, SS LS.  Surgeon: Matthew Saras  Asst.: McComb  EBL 100 cc  Procedure and findings:  The patient was taken the operating room after an adequate level of general anesthesia was obtained with the patient's legs in stirrups the abdomen perineum and vagina were prepped and draped in the usual fashion, the bladder was drained. Appropriate timeouts were taken at that point. EUA was carried out small uterus with moderate prolapse cystocele rectocele noted EUA otherwise negative. Hulka tenaculum was positioned attention directed to the abdomen where the subumbilical incision was infiltrated with quarter percent Marcaine plain small incision was made in the varies needle was introduced without difficulty. Its intra-abdominal position was verified by pressure water testing. After 2-1/2 L pneumoperitoneum syncopated, lap scopic trocar and sleeve were then introduced that difficulty. There was no evidence of any bleeding or trauma. 3 finger breaths above the symphysis in the midline, 5 mm trocar was inserted under direct visualization. The pelvic findings as follows:  Uterus itself was small adnexa unremarkable anterior posterior spaces free and clear no other abnormalities noted. Using an atraumatic grasping forcep the right tube and ovary were grasped and placed on traction toward the midline the right IP ligament was coagulated and divided using the in seal device staying close to the ovary. This was done after the course of the pelvic ureter on that side was seen well below. This was carried down to and including the right round ligament. The exact same repeated on the opposite side again after identifying the course of the ureter well below. The vaginal portion the procedure started at this point. Legs were extended weighted speculum was positioned  cervix grasped with tenaculum cervical vaginal mucosa was incised posterior culdotomy performed without difficulty the bladder was advanced superiorly with sharp and blunt dissection until the anterior peritoneal reflection could be identified this was entered sharply and a retractor used to gently elevate the bladder out of the field. Using the LigaSure device the uterosacral ligament, cardinal ligament uterine vasculature had a cold and upper broad ligament pedicles were coagulated and divided. The fundus of the uterus is in delivered posteriorly remaining pedicles coagulated and divided removing the uterus tubes and ovaries, sent to pathology.  Vaginal cuff was then closed from 3 to 9:00 with a running locked 0 Vicryls suture. McCall's culdoplasty suture was then placed from left uterosacral ligament picking up posterior peritoneum across to the opposite side and tied down for extra posterior support. Prior to closure sponge, needle, instrument counts reported as correct 2. Vaginal cuff was then closed right to left with interrupted 20 Vicryls sutures. This was hemostatic the anterior vaginal mucosa was then grasped in the midline and was undermined in the midline from the cuff to just below the urethra this was done after injecting with dilute Xylocaine with epinephrine. The cystocele was then reduced with sharp and blunt dissection, the perivesical fascia was plicated in midline with a 20 Vicryls interrupted sutures. Excess vaginal mucosa was trimmed and the vaginal mucosa reapproximated with 20 Vicryls interrupted sutures. Posteriorly a small piece of fourchette skin was excised the posterior mucosa was dissected in the midline approximately two thirds the way up with sharp and blunt dissection. The rectocele could be easily reduced, the right sacrospinous ligament was palpated through blunt dissection, the Area device was then used to place a sore mobile Vicryls suture through the sacrospinous ligament 2  finger breaths medial to the initial spine. This was held temporarily, was anchored to the underside of the posterior cuff and held small amount of excess mucosa was trimmed the rectocele was reduced with interrupted 20 Vicryls suture. Vaginal mucosa the upper part was closed with interrupted 20 Vicryls sutures. The sacrospinous ligament suture was then tied down to anchor the cuff with good elevation. Remainder of the posterior mucosa closed with 20 Vicryls sutures with 30 Vicryls repeat sutures on the perineum. Estridge and soaked vaginal pack was position with a Foley catheter draining clear urine. Repeat laparoscopy revealed the operative site to be hemostatic at reduced pressure no other abnormalities since was removed, gas allowed to escape the umbilical incision closed with 4-0 Monocryl subcuticular closure and Dermabond on the lower she tolerated this well went to recovery room in good condition.  Dictated with ElmoreD.

## 2015-01-08 NOTE — Progress Notes (Signed)
The patient was re-examined with no change in status 

## 2015-01-08 NOTE — Anesthesia Procedure Notes (Signed)
Procedure Name: Intubation Date/Time: 01/08/2015 7:25 AM Performed by: Raenette Rover Pre-anesthesia Checklist: Patient identified, Patient being monitored, Emergency Drugs available and Suction available Patient Re-evaluated:Patient Re-evaluated prior to inductionOxygen Delivery Method: Circle system utilized Preoxygenation: Pre-oxygenation with 100% oxygen Intubation Type: IV induction Ventilation: Mask ventilation without difficulty Laryngoscope Size: Miller and 2 Grade View: Grade I Tube type: Oral Tube size: 7.0 mm Number of attempts: 1 Airway Equipment and Method: Stylet Placement Confirmation: ETT inserted through vocal cords under direct vision,  breath sounds checked- equal and bilateral,  positive ETCO2 and CO2 detector Secured at: 20 cm Tube secured with: Tape Dental Injury: Teeth and Oropharynx as per pre-operative assessment

## 2015-01-08 NOTE — Anesthesia Postprocedure Evaluation (Signed)
Anesthesia Post Note  Patient: [name]  Procedure(s) Performed: Procedure(s) (LRB): LAPAROSCOPIC ASSISTED VAGINAL HYSTERECTOMY (N/A) ANTERIOR AND POSTERIOR REPAIR WITH possible SACROSPINOUS FIXATION (N/A)  Anesthesia type: General  Patient location: PACU  Post pain: Pain level controlled  Post assessment: Post-op Vital signs reviewed  Last Vitals:  Filed Vitals:   01/08/15 1015  BP: 101/54  Pulse: 44  Temp:   Resp: 16    Post vital signs: Reviewed  Level of consciousness: sedated  Complications: No apparent anesthesia complications

## 2015-01-09 ENCOUNTER — Encounter (HOSPITAL_COMMUNITY): Payer: Self-pay | Admitting: Obstetrics and Gynecology

## 2015-01-09 DIAGNOSIS — D251 Intramural leiomyoma of uterus: Secondary | ICD-10-CM | POA: Diagnosis not present

## 2015-01-09 DIAGNOSIS — N888 Other specified noninflammatory disorders of cervix uteri: Secondary | ICD-10-CM | POA: Diagnosis not present

## 2015-01-09 DIAGNOSIS — Z79899 Other long term (current) drug therapy: Secondary | ICD-10-CM | POA: Diagnosis not present

## 2015-01-09 DIAGNOSIS — M4802 Spinal stenosis, cervical region: Secondary | ICD-10-CM | POA: Diagnosis not present

## 2015-01-09 DIAGNOSIS — E780 Pure hypercholesterolemia: Secondary | ICD-10-CM | POA: Diagnosis not present

## 2015-01-09 DIAGNOSIS — M1990 Unspecified osteoarthritis, unspecified site: Secondary | ICD-10-CM | POA: Diagnosis not present

## 2015-01-09 DIAGNOSIS — I340 Nonrheumatic mitral (valve) insufficiency: Secondary | ICD-10-CM | POA: Diagnosis not present

## 2015-01-09 DIAGNOSIS — K219 Gastro-esophageal reflux disease without esophagitis: Secondary | ICD-10-CM | POA: Diagnosis not present

## 2015-01-09 DIAGNOSIS — Z91040 Latex allergy status: Secondary | ICD-10-CM | POA: Diagnosis not present

## 2015-01-09 DIAGNOSIS — Z7982 Long term (current) use of aspirin: Secondary | ICD-10-CM | POA: Diagnosis not present

## 2015-01-09 DIAGNOSIS — I10 Essential (primary) hypertension: Secondary | ICD-10-CM | POA: Diagnosis not present

## 2015-01-09 DIAGNOSIS — Z86711 Personal history of pulmonary embolism: Secondary | ICD-10-CM | POA: Diagnosis not present

## 2015-01-09 DIAGNOSIS — K589 Irritable bowel syndrome without diarrhea: Secondary | ICD-10-CM | POA: Diagnosis not present

## 2015-01-09 DIAGNOSIS — D252 Subserosal leiomyoma of uterus: Secondary | ICD-10-CM | POA: Diagnosis not present

## 2015-01-09 DIAGNOSIS — N812 Incomplete uterovaginal prolapse: Secondary | ICD-10-CM | POA: Diagnosis not present

## 2015-01-09 DIAGNOSIS — M069 Rheumatoid arthritis, unspecified: Secondary | ICD-10-CM | POA: Diagnosis not present

## 2015-01-09 DIAGNOSIS — Z880 Allergy status to penicillin: Secondary | ICD-10-CM | POA: Diagnosis not present

## 2015-01-09 LAB — CBC
HCT: 28.9 % — ABNORMAL LOW (ref 36.0–46.0)
Hemoglobin: 9.7 g/dL — ABNORMAL LOW (ref 12.0–15.0)
MCH: 33.1 pg (ref 26.0–34.0)
MCHC: 33.6 g/dL (ref 30.0–36.0)
MCV: 98.6 fL (ref 78.0–100.0)
Platelets: 482 10*3/uL — ABNORMAL HIGH (ref 150–400)
RBC: 2.93 MIL/uL — ABNORMAL LOW (ref 3.87–5.11)
RDW: 14.9 % (ref 11.5–15.5)
WBC: 14 10*3/uL — ABNORMAL HIGH (ref 4.0–10.5)

## 2015-01-09 MED ORDER — ENOXAPARIN SODIUM 40 MG/0.4ML ~~LOC~~ SOLN: 40.0000 mg | SUBCUTANEOUS | Status: DC

## 2015-01-09 MED ORDER — IBUPROFEN 800 MG PO TABS: 800.0000 mg | ORAL_TABLET | Freq: Three times a day (TID) | ORAL | Status: DC | PRN

## 2015-01-09 MED ORDER — ENOXAPARIN (LOVENOX) PATIENT EDUCATION KIT
PACK | Freq: Once | Status: AC
  Administered 2015-01-09: 11:00:00
  Filled 2015-01-09: qty 1

## 2015-01-09 MED ORDER — IBUPROFEN 600 MG PO TABS: 600.0000 mg | ORAL_TABLET | Freq: Four times a day (QID) | ORAL | Status: DC | PRN

## 2015-01-09 NOTE — Discharge Summary (Signed)
Physician Discharge Summary  Patient ID: [name] MRN: [id number] DOB/AGE: 76-Mar-1942 76 y.o.  Admit date: 01/08/2015 Discharge date: 01/09/2015  Admission Diagnoses:uterine prolapse, cystocele/rectocele  Discharge Diagnoses: same Active Problems:   Uterine prolapse   Discharged Condition: good  Hospital Course: adm for LAVH BSO/A and P repair + SSLS, on POD # 1 , ambulating, tol PO, afeb, voiding  Consults: None  Significant Diagnostic Studies: labs:  CBC    Component Value Date/Time   WBC 6.7 01/01/2015 0845   RBC 3.51* 01/01/2015 0845   HGB 11.6* 01/01/2015 0845   HCT 35.4* 01/01/2015 0845   PLT 559* 01/01/2015 0845   MCV 100.9* 01/01/2015 0845   MCH 33.0 01/01/2015 0845   MCHC 32.8 01/01/2015 0845   RDW 15.0 01/01/2015 0845   LYMPHSABS 2.5 05/05/2014 0913   MONOABS 0.8 05/05/2014 0913   EOSABS 0.3 05/05/2014 0913   BASOSABS 0.1 05/05/2014 0913      Treatments: surgery: LAVH BSO, A and P repair, SSLS  Discharge Exam: Blood pressure 133/72, pulse 72, temperature 98.4 F (36.9 C), temperature source Oral, resp. rate 18, height 5' (1.524 m), weight 176 lb (79.833 kg), SpO2 98 %. abd + BS, incs C/D, nontender  Disposition: 06-Home-Health Care Svc     Medication List    STOP taking these medications        HYDROcodone-acetaminophen 5-325 MG per tablet  Commonly known as:  NORCO     lisinopril 5 MG tablet  Commonly known as:  PRINIVIL,ZESTRIL     mupirocin ointment 2 %  Commonly known as:  BACTROBAN     promethazine 25 MG tablet  Commonly known as:  PHENERGAN     simvastatin 20 MG tablet  Commonly known as:  ZOCOR      TAKE these medications        acetaminophen 650 MG CR tablet  Commonly known as:  TYLENOL  Take 650 mg by mouth daily as needed for pain.     aspirin 81 MG tablet  Take 81 mg by mouth daily.     diazepam 5 MG tablet  Commonly known as:  VALIUM  Take 0.5-1 tablets (2.5-5 mg total) by mouth every 6 (six) hours as needed  for muscle spasms or sedation.     enoxaparin 40 MG/0.4ML injection  Commonly known as:  LOVENOX  Inject 0.4 mLs (40 mg total) into the skin daily.     ibuprofen 800 MG tablet  Commonly known as:  ADVIL,MOTRIN  Take 1 tablet (800 mg total) by mouth every 8 (eight) hours as needed for moderate pain (mild pain).     ibuprofen 600 MG tablet  Commonly known as:  ADVIL,MOTRIN  Take 1 tablet (600 mg total) by mouth every 6 (six) hours as needed.     omeprazole 20 MG capsule  Commonly known as:  PRILOSEC  TAKE 1 CAPSULE BY MOUTH DAILY     PATADAY 0.2 % Soln  Generic drug:  Olopatadine HCl  Place 1 drop into both eyes daily as needed (for itching).     polyethylene glycol powder powder  Commonly known as:  GLYCOLAX/MIRALAX  MIX 1 CAPFUL INTO 8 OUNCES OF WATER AND DRINK DAILY AS DIRECTED     terconazole 0.4 % vaginal cream  Commonly known as:  TERAZOL 7  Place 1 applicator vaginally at bedtime.     triamcinolone cream 0.1 %  Commonly known as:  KENALOG  Apply 1 application topically 3 (three) times daily.  triamterene-hydrochlorothiazide 75-50 MG per tablet  Commonly known as:  MAXZIDE  TAKE 1 TABLET BY MOUTH EVERY DAY           Follow-up Information    Follow up with Margarette Asal, MD In 1 week.   Specialty:  Obstetrics and Gynecology   Why:  office will call   Contact information:   [place] [address] [phone]       Signed: Margarette Asal 01/09/2015, 8:58 AM

## 2015-01-09 NOTE — Progress Notes (Signed)
Patient discharged home with family member... Discharge instructions reviewed with patient and she verbalized understanding... Patient given lovenox education kit, and RN educated patient on use of lovenox at home. Pt verbalized understanding. Patient also voiced understanding of having appointment on Thursday to have foley removed. Condition stable... No equipment... Taken to car via wheelchair by P. Olevia Bowens, NT.

## 2015-01-09 NOTE — Progress Notes (Addendum)
Dr. Matthew Saras updated that patient was unable to void at 6 hour mark. Orders given to insert foley catheter, and his office will call patient to have her come back to office on Thursday to have foley removed. Patient updated about plan of care and is agreeable. Latex free foley inserted. Dr. Matthew Saras states okay to discharge patient. Will continue to monitor.

## 2015-01-09 NOTE — Progress Notes (Signed)
Vaginal packing removed per order... Moderate amount of dark red drainage noted on packing... Patient tolerated removal well, and pad changed after packing removed. Will continue to monitor.

## 2015-01-24 DIAGNOSIS — M059 Rheumatoid arthritis with rheumatoid factor, unspecified: Secondary | ICD-10-CM | POA: Diagnosis not present

## 2015-01-24 DIAGNOSIS — M75100 Unspecified rotator cuff tear or rupture of unspecified shoulder, not specified as traumatic: Secondary | ICD-10-CM | POA: Diagnosis not present

## 2015-01-29 ENCOUNTER — Other Ambulatory Visit: Payer: Self-pay | Admitting: Internal Medicine

## 2015-01-30 DIAGNOSIS — Z09 Encounter for follow-up examination after completed treatment for conditions other than malignant neoplasm: Secondary | ICD-10-CM | POA: Diagnosis not present

## 2015-02-05 DIAGNOSIS — D509 Iron deficiency anemia, unspecified: Secondary | ICD-10-CM | POA: Diagnosis not present

## 2015-02-15 ENCOUNTER — Ambulatory Visit (INDEPENDENT_AMBULATORY_CARE_PROVIDER_SITE_OTHER): Payer: Medicare Other | Admitting: Internal Medicine

## 2015-02-15 DIAGNOSIS — Z23 Encounter for immunization: Secondary | ICD-10-CM

## 2015-03-05 DIAGNOSIS — D509 Iron deficiency anemia, unspecified: Secondary | ICD-10-CM | POA: Diagnosis not present

## 2015-03-12 ENCOUNTER — Ambulatory Visit (INDEPENDENT_AMBULATORY_CARE_PROVIDER_SITE_OTHER): Payer: Medicare Other | Admitting: Internal Medicine

## 2015-03-12 ENCOUNTER — Encounter: Payer: Self-pay | Admitting: Internal Medicine

## 2015-03-12 VITALS — BP 116/72 | HR 75 | Temp 97.7°F | Ht 60.0 in | Wt 172.0 lb

## 2015-03-12 DIAGNOSIS — I10 Essential (primary) hypertension: Secondary | ICD-10-CM | POA: Diagnosis not present

## 2015-03-12 DIAGNOSIS — T148 Other injury of unspecified body region: Secondary | ICD-10-CM

## 2015-03-12 DIAGNOSIS — W57XXXA Bitten or stung by nonvenomous insect and other nonvenomous arthropods, initial encounter: Secondary | ICD-10-CM | POA: Diagnosis not present

## 2015-03-12 MED ORDER — TRIAMCINOLONE ACETONIDE 0.1 % EX CREA: 1.0000 "application " | TOPICAL_CREAM | Freq: Three times a day (TID) | CUTANEOUS | Status: DC

## 2015-03-12 NOTE — Patient Instructions (Signed)
Use triamcinolone cream on bites 3 times daily until healed

## 2015-03-12 NOTE — Progress Notes (Signed)
   Subjective:    Patient ID: [name], female    DOB: 06-Jul-1940, 74 y.o.   MRN: [id number]  HPI  74 year old Black Female called regarding a rash on arms and legs. Later she discovered she had bed bugs in her home. She noticed these after carpet was taken up in hardwood floors were put down. There was a couch for multiple family members sat and received bites. She has got a new couch into new mattresses. She has triamcinolone cream at home but is out of that at the present time.    Review of Systems     Objective:   Physical Exam  Multiple insect bites on arms that are distinct with surrounding erythema. No evidence of secondary infection      Assessment & Plan:  Insect bites  Plan: Refilled triamcinolone cream 0.1% 45 g with 3 refills to use on insect bites 3 times a day until healed.

## 2015-04-16 ENCOUNTER — Other Ambulatory Visit: Payer: Self-pay | Admitting: Internal Medicine

## 2015-04-16 NOTE — Telephone Encounter (Signed)
Refill all for one year

## 2015-04-30 DIAGNOSIS — D509 Iron deficiency anemia, unspecified: Secondary | ICD-10-CM | POA: Diagnosis not present

## 2015-04-30 DIAGNOSIS — Z130 Encounter for screening for diseases of the blood and blood-forming organs and certain disorders involving the immune mechanism: Secondary | ICD-10-CM | POA: Diagnosis not present

## 2015-05-10 ENCOUNTER — Encounter: Payer: Self-pay | Admitting: Internal Medicine

## 2015-05-10 ENCOUNTER — Ambulatory Visit (INDEPENDENT_AMBULATORY_CARE_PROVIDER_SITE_OTHER): Payer: Medicare Other | Admitting: Internal Medicine

## 2015-05-10 VITALS — BP 130/68 | HR 65 | Temp 98.1°F | Resp 20 | Ht 60.0 in | Wt 170.0 lb

## 2015-05-10 DIAGNOSIS — M05711 Rheumatoid arthritis with rheumatoid factor of right shoulder without organ or systems involvement: Secondary | ICD-10-CM

## 2015-05-10 DIAGNOSIS — E118 Type 2 diabetes mellitus with unspecified complications: Secondary | ICD-10-CM | POA: Diagnosis not present

## 2015-05-10 DIAGNOSIS — Z130 Encounter for screening for diseases of the blood and blood-forming organs and certain disorders involving the immune mechanism: Secondary | ICD-10-CM | POA: Diagnosis not present

## 2015-05-10 DIAGNOSIS — R5383 Other fatigue: Secondary | ICD-10-CM | POA: Diagnosis not present

## 2015-05-10 DIAGNOSIS — I10 Essential (primary) hypertension: Secondary | ICD-10-CM | POA: Diagnosis not present

## 2015-05-10 DIAGNOSIS — D649 Anemia, unspecified: Secondary | ICD-10-CM | POA: Diagnosis not present

## 2015-05-10 DIAGNOSIS — R630 Anorexia: Secondary | ICD-10-CM

## 2015-05-10 DIAGNOSIS — E785 Hyperlipidemia, unspecified: Secondary | ICD-10-CM

## 2015-05-10 LAB — IRON AND TIBC
%SAT: 18 % (ref 11–50)
Iron: 60 ug/dL (ref 45–160)
TIBC: 339 ug/dL (ref 250–450)
UIBC: 279 ug/dL (ref 125–400)

## 2015-05-10 LAB — RETICULOCYTES
ABS Retic: 51.4 10*3/uL (ref 19.0–186.0)
RBC.: 3.21 MIL/uL — ABNORMAL LOW (ref 3.87–5.11)
Retic Ct Pct: 1.6 % (ref 0.4–2.3)

## 2015-05-10 LAB — COMPLETE METABOLIC PANEL WITH GFR
ALT: 12 U/L (ref 6–29)
AST: 23 U/L (ref 10–35)
Albumin: 3.7 g/dL (ref 3.6–5.1)
Alkaline Phosphatase: 79 U/L (ref 33–130)
BUN: 42 mg/dL — ABNORMAL HIGH (ref 7–25)
CO2: 19 mmol/L — ABNORMAL LOW (ref 20–31)
Calcium: 9.6 mg/dL (ref 8.6–10.4)
Chloride: 106 mmol/L (ref 98–110)
Creat: 1.35 mg/dL — ABNORMAL HIGH (ref 0.60–0.93)
GFR, Est African American: 44 mL/min — ABNORMAL LOW (ref >=60)
GFR, Est Non African American: 38 mL/min — ABNORMAL LOW (ref >=60)
Glucose, Bld: 82 mg/dL (ref 65–99)
Potassium: 4.9 mmol/L (ref 3.5–5.3)
Sodium: 137 mmol/L (ref 135–146)
Total Bilirubin: 0.4 mg/dL (ref 0.2–1.2)
Total Protein: 7.1 g/dL (ref 6.1–8.1)

## 2015-05-10 LAB — CBC WITH DIFFERENTIAL/PLATELET
Basophils Absolute: 0.1 10*3/uL (ref 0.0–0.1)
Basophils Relative: 1 % (ref 0–1)
Eosinophils Absolute: 0.4 10*3/uL (ref 0.0–0.7)
Eosinophils Relative: 5 % (ref 0–5)
HCT: 30.7 % — ABNORMAL LOW (ref 36.0–46.0)
Hemoglobin: 10.1 g/dL — ABNORMAL LOW (ref 12.0–15.0)
Lymphocytes Relative: 27 % (ref 12–46)
Lymphs Abs: 2.2 10*3/uL (ref 0.7–4.0)
MCH: 31.5 pg (ref 26.0–34.0)
MCHC: 32.9 g/dL (ref 30.0–36.0)
MCV: 95.6 fL (ref 78.0–100.0)
MPV: 8.9 fL (ref 8.6–12.4)
Monocytes Absolute: 1.1 10*3/uL — ABNORMAL HIGH (ref 0.1–1.0)
Monocytes Relative: 13 % — ABNORMAL HIGH (ref 3–12)
Neutro Abs: 4.4 10*3/uL (ref 1.7–7.7)
Neutrophils Relative %: 54 % (ref 43–77)
Platelets: 610 10*3/uL — ABNORMAL HIGH (ref 150–400)
RBC: 3.21 MIL/uL — ABNORMAL LOW (ref 3.87–5.11)
RDW: 14.8 % (ref 11.5–15.5)
WBC: 8.1 10*3/uL (ref 4.0–10.5)

## 2015-05-10 LAB — VITAMIN B12: Vitamin B-12: 928 pg/mL — ABNORMAL HIGH (ref 211–911)

## 2015-05-10 LAB — FOLATE: Folate: >20 ng/mL

## 2015-05-10 LAB — TSH: TSH: 1.955 u[IU]/mL (ref 0.350–4.500)

## 2015-05-10 NOTE — Patient Instructions (Signed)
Anemia studies are pending with further recommendations to follow. Return in 3 Hemoccult cards.

## 2015-05-10 NOTE — Progress Notes (Signed)
   Subjective:    Patient ID: [name], female    DOB: 09-01-1940, 74 y.o.   MRN: [id number]  HPI  74 year old Black Female with history of rheumatoid arthritis treated with when necessary Advil seen at request of Dr. Molli Posey, GYN physician, regarding persistent anemia. In mid August patient had  Laparoscopic vaginal hysterectomy, BSO, A&P repair. This was due to a prolapse. Hemoglobin was 11.6 g  August 8 and according to GYN notes was 10.0g at [facility] visit 03/05/2015.  CBC at GYN office on December 5 showed hemoglobin 10.0 g,,  MCV 97.7 with normal differential.  Platelet count 603,000. White blood cell count was 9700. Patient complaining of decreased appetite and says that food doesn't taste right. No pica. She does take a multivitamin with iron and has been taking one for several weeks nail with no improvement in her hemoglobin. She had colonoscopy in 2014 that was normal by Dr.Magod at [facility] endoscopy.   Other medical problems include essential hypertension, hyperlipidemia, controlled tight 2 diabetes without complication, anxiety, history of irritable bowel syndrome and status post left shoulder replacement. 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  History of H. Pylori gastritis in July 2000.   Has chronic right shoulder pain. Denies bright red blood per rectum or black tarry stools. Says doesn't take Advil but may be once or twice a day at most. Does take baby aspirin daily.     Weight is 170 pounds today  and was 170 pounds in April. Anemia studies are pending including iron and iron-binding capacity, B-12 and folate levels   Did not tolerate methotrexate for rheumatoid arthritis because of persistent recurrent mouth ulcers.  Review of Systems     Objective:   Physical Exam   Neck is supple without JVD thyromegaly or carotid bruits. Chest clear. Cardiac exam regular rate and rhythm normal S1 and S2. Extremities without edema.  No hepatosplenomegaly masses or tenderness. Was given 3 Hemoccult cards to take home and return here. She is to stop iron and Advil for  at least 3 days before completing these cards      Assessment & Plan:   Persistent anemia  status post GYN surgery August 2016  despite daily iron supplementation. Etiology unclear at this point. Anemia studies pending. Hemoccult cards given.  History of NSAID therapy for rheumatoid arthritis and shoulder pain    History of GE reflux- treated with Prilosec  History of H. Pylori gastritis July 2000- treated    controlled type 2 diabetes mellitus   Hyperlipidemia  Essential hypertension  Plan: Anemia studies are pending including iron and iron-binding capacity, B-12 folate levels and reticulocyte count. We're also checking TSH, C met, hemoglobin A1c and CBC with differential once again. She has 3 Hemoccult cards to return to this office. Further recommendations to follow.

## 2015-05-11 LAB — HEMOGLOBIN A1C
Hgb A1c MFr Bld: 6.3 % — ABNORMAL HIGH (ref <5.7)
Mean Plasma Glucose: 134 mg/dL — ABNORMAL HIGH (ref <117)

## 2015-05-14 ENCOUNTER — Telehealth: Payer: Self-pay

## 2015-05-14 MED ORDER — TRAMADOL HCL 50 MG PO TABS: 50.0000 mg | ORAL_TABLET | Freq: Three times a day (TID) | ORAL | Status: DC | PRN

## 2015-05-14 MED ORDER — IRON 325 (65 FE) MG PO TABS: ORAL_TABLET | ORAL | Status: DC

## 2015-05-14 NOTE — Telephone Encounter (Signed)
We can call in Tramadol 50 mg 3 times a day and see how that goes. #30 to start with

## 2015-05-14 NOTE — Telephone Encounter (Signed)
Medication sent to pharmacy;  Patient states that she wants to know what kind of pain medication she should be taking for arthritis. She is currently taking extra strength tylenol for it.

## 2015-05-14 NOTE — Telephone Encounter (Signed)
Called medication to pharmacy. Called patient and let her know, she states that she cannot take the medication as it makes her sick. She states that she would rather take tylenol. Advised to continue tylenol and have added tramadol to her allergies/intolerance list

## 2015-05-18 ENCOUNTER — Other Ambulatory Visit (INDEPENDENT_AMBULATORY_CARE_PROVIDER_SITE_OTHER): Payer: Medicare Other | Admitting: Internal Medicine

## 2015-05-18 DIAGNOSIS — Z1211 Encounter for screening for malignant neoplasm of colon: Secondary | ICD-10-CM

## 2015-05-18 LAB — POC HEMOCCULT BLD/STL (HOME/3-CARD/SCREEN)
Card #2 Fecal Occult Blod, POC: POSITIVE
Card #3 Fecal Occult Blood, POC: POSITIVE
Fecal Occult Blood, POC: POSITIVE — AB

## 2015-05-19 ENCOUNTER — Other Ambulatory Visit: Payer: Self-pay | Admitting: Internal Medicine

## 2015-06-07 ENCOUNTER — Encounter: Payer: Self-pay | Admitting: Internal Medicine

## 2015-06-07 ENCOUNTER — Ambulatory Visit (INDEPENDENT_AMBULATORY_CARE_PROVIDER_SITE_OTHER): Payer: Medicare Other | Admitting: Internal Medicine

## 2015-06-07 VITALS — BP 130/70 | HR 76 | Temp 98.4°F | Resp 20 | Ht 60.0 in | Wt 167.0 lb

## 2015-06-07 DIAGNOSIS — R748 Abnormal levels of other serum enzymes: Secondary | ICD-10-CM

## 2015-06-07 DIAGNOSIS — D649 Anemia, unspecified: Secondary | ICD-10-CM | POA: Diagnosis not present

## 2015-06-07 DIAGNOSIS — J069 Acute upper respiratory infection, unspecified: Secondary | ICD-10-CM

## 2015-06-07 DIAGNOSIS — R7989 Other specified abnormal findings of blood chemistry: Secondary | ICD-10-CM

## 2015-06-07 LAB — CBC WITH DIFFERENTIAL/PLATELET
Basophils Absolute: 0 10*3/uL (ref 0.0–0.1)
Basophils Relative: 0 % (ref 0–1)
Eosinophils Absolute: 0.3 10*3/uL (ref 0.0–0.7)
Eosinophils Relative: 3 % (ref 0–5)
HCT: 32.6 % — ABNORMAL LOW (ref 36.0–46.0)
Hemoglobin: 10.6 g/dL — ABNORMAL LOW (ref 12.0–15.0)
Lymphocytes Relative: 21 % (ref 12–46)
Lymphs Abs: 2.1 10*3/uL (ref 0.7–4.0)
MCH: 31.4 pg (ref 26.0–34.0)
MCHC: 32.5 g/dL (ref 30.0–36.0)
MCV: 96.4 fL (ref 78.0–100.0)
MPV: 8.8 fL (ref 8.6–12.4)
Monocytes Absolute: 1.2 10*3/uL — ABNORMAL HIGH (ref 0.1–1.0)
Monocytes Relative: 12 % (ref 3–12)
Neutro Abs: 6.5 10*3/uL (ref 1.7–7.7)
Neutrophils Relative %: 64 % (ref 43–77)
Platelets: 525 10*3/uL — ABNORMAL HIGH (ref 150–400)
RBC: 3.38 MIL/uL — ABNORMAL LOW (ref 3.87–5.11)
RDW: 15.3 % (ref 11.5–15.5)
WBC: 10.1 10*3/uL (ref 4.0–10.5)

## 2015-06-07 LAB — BASIC METABOLIC PANEL
BUN: 28 mg/dL — ABNORMAL HIGH (ref 7–25)
CO2: 21 mmol/L (ref 20–31)
Calcium: 9.5 mg/dL (ref 8.6–10.4)
Chloride: 100 mmol/L (ref 98–110)
Creat: 1.32 mg/dL — ABNORMAL HIGH (ref 0.60–0.93)
Glucose, Bld: 84 mg/dL (ref 65–99)
Potassium: 4.4 mmol/L (ref 3.5–5.3)
Sodium: 136 mmol/L (ref 135–146)

## 2015-06-07 LAB — RETICULOCYTES
ABS Retic: 43.9 10*3/uL (ref 19.0–186.0)
RBC.: 3.38 MIL/uL — ABNORMAL LOW (ref 3.87–5.11)
Retic Ct Pct: 1.3 % (ref 0.4–2.3)

## 2015-06-07 LAB — IRON AND TIBC
%SAT: 7 % — ABNORMAL LOW (ref 11–50)
Iron: 21 ug/dL — ABNORMAL LOW (ref 45–160)
TIBC: 296 ug/dL (ref 250–450)
UIBC: 275 ug/dL (ref 125–400)

## 2015-06-07 MED ORDER — AZITHROMYCIN 250 MG PO TABS: ORAL_TABLET | ORAL | Status: DC

## 2015-06-07 MED ORDER — BENZONATATE 100 MG PO CAPS: 200.0000 mg | ORAL_CAPSULE | Freq: Three times a day (TID) | ORAL | Status: DC | PRN

## 2015-06-07 NOTE — Progress Notes (Signed)
   Subjective:    Patient ID: [name], female    DOB: 1941-01-03, 75 y.o.   MRN: [id number]  HPI Here today for follow up of anemia. Iron level was normal as well as B12 and folate. Onset URI symptoms last week.Liquid Mucinex caused gastritis she says. However, recently had 3 positive hemoccult cards off vitamins and aspirin which is of concern. Hx Rheumatoid arthritis. Has had some light pink vaginal bleeding for 3 days while doing hemoccult cards. Normal bowel movements with Miralax.    Review of Systems     Objective:   Physical Exam  Constitutional: No distress.  HENT:  Right Ear: External ear normal.  Left Ear: External ear normal.  Mouth/Throat: Oropharynx is clear and moist.  Eyes: Right eye exhibits no discharge. Left eye exhibits no discharge.  Neck: Neck supple. No JVD present.  Cardiovascular: Normal heart sounds.   Pulmonary/Chest: Effort normal and breath sounds normal. No respiratory distress. She has no wheezes. She has no rales.  Abdominal: She exhibits no distension. There is no tenderness. There is no rebound and no guarding.  Lymphadenopathy:    She has no cervical adenopathy.  Skin: She is not diaphoretic.  Vitals reviewed.         Assessment & Plan:  Acute URI  Anemia  Rheumatoid arthritis  Plan: Zithromax Z-PAK take as directed. Tessalon Perles 200 mg 3 times daily as needed for cough. CBC and reticulocyte count drawn as well as be met to check creatinine. May need to return to Dr. Watt Climes for possible endoscopy colonoscopy.

## 2015-06-07 NOTE — Patient Instructions (Signed)
Take Zithromax Z-PAK as directed. Take Tessalon Perles as needed for cough. Rest and drink plenty of fluids. Anemia studies repeated today. Serum creatinine rechecked. Further recommendations to follow.

## 2015-06-11 ENCOUNTER — Other Ambulatory Visit: Payer: Self-pay | Admitting: Internal Medicine

## 2015-06-17 ENCOUNTER — Other Ambulatory Visit: Payer: Self-pay | Admitting: Internal Medicine

## 2015-06-21 DIAGNOSIS — R109 Unspecified abdominal pain: Secondary | ICD-10-CM | POA: Diagnosis not present

## 2015-06-21 DIAGNOSIS — K921 Melena: Secondary | ICD-10-CM | POA: Diagnosis not present

## 2015-06-21 DIAGNOSIS — D500 Iron deficiency anemia secondary to blood loss (chronic): Secondary | ICD-10-CM | POA: Diagnosis not present

## 2015-06-29 DIAGNOSIS — K921 Melena: Secondary | ICD-10-CM | POA: Diagnosis not present

## 2015-07-16 ENCOUNTER — Other Ambulatory Visit: Payer: Self-pay | Admitting: Gastroenterology

## 2015-07-16 DIAGNOSIS — K2950 Unspecified chronic gastritis without bleeding: Secondary | ICD-10-CM | POA: Diagnosis not present

## 2015-07-16 DIAGNOSIS — K317 Polyp of stomach and duodenum: Secondary | ICD-10-CM | POA: Diagnosis not present

## 2015-07-16 DIAGNOSIS — D509 Iron deficiency anemia, unspecified: Secondary | ICD-10-CM | POA: Diagnosis not present

## 2015-07-16 DIAGNOSIS — D500 Iron deficiency anemia secondary to blood loss (chronic): Secondary | ICD-10-CM | POA: Diagnosis not present

## 2015-07-16 DIAGNOSIS — R1084 Generalized abdominal pain: Secondary | ICD-10-CM | POA: Diagnosis not present

## 2015-07-16 DIAGNOSIS — K449 Diaphragmatic hernia without obstruction or gangrene: Secondary | ICD-10-CM | POA: Diagnosis not present

## 2015-07-24 DIAGNOSIS — M1990 Unspecified osteoarthritis, unspecified site: Secondary | ICD-10-CM | POA: Diagnosis not present

## 2015-07-24 DIAGNOSIS — M25571 Pain in right ankle and joints of right foot: Secondary | ICD-10-CM | POA: Diagnosis not present

## 2015-07-24 DIAGNOSIS — M25511 Pain in right shoulder: Secondary | ICD-10-CM | POA: Diagnosis not present

## 2015-07-24 DIAGNOSIS — M19041 Primary osteoarthritis, right hand: Secondary | ICD-10-CM | POA: Diagnosis not present

## 2015-07-24 DIAGNOSIS — M19071 Primary osteoarthritis, right ankle and foot: Secondary | ICD-10-CM | POA: Diagnosis not present

## 2015-07-24 DIAGNOSIS — M059 Rheumatoid arthritis with rheumatoid factor, unspecified: Secondary | ICD-10-CM | POA: Diagnosis not present

## 2015-07-25 ENCOUNTER — Telehealth: Payer: Self-pay | Admitting: Internal Medicine

## 2015-07-25 NOTE — Telephone Encounter (Signed)
Received report from [facility] the a fax where patient had upper GI endoscopy 07/16/2015 showing small hiatal hernia and chronic gastritis which was biopsied. Multiple gastric polyps biopsied. Chronic PPI changes noted. Endoscopy was done because of anemia.  Pathology was negative for Helicobacter pylori. Pathology diagnosis was chronic gastritis in PPI effect. No dysplasia or malignancy.

## 2015-08-06 ENCOUNTER — Other Ambulatory Visit: Payer: Self-pay | Admitting: Internal Medicine

## 2015-08-07 DIAGNOSIS — H35033 Hypertensive retinopathy, bilateral: Secondary | ICD-10-CM | POA: Diagnosis not present

## 2015-08-07 DIAGNOSIS — H35031 Hypertensive retinopathy, right eye: Secondary | ICD-10-CM | POA: Diagnosis not present

## 2015-08-07 DIAGNOSIS — H35032 Hypertensive retinopathy, left eye: Secondary | ICD-10-CM | POA: Diagnosis not present

## 2015-08-07 DIAGNOSIS — H402230 Chronic angle-closure glaucoma, bilateral, stage unspecified: Secondary | ICD-10-CM | POA: Diagnosis not present

## 2015-08-07 DIAGNOSIS — H1013 Acute atopic conjunctivitis, bilateral: Secondary | ICD-10-CM | POA: Diagnosis not present

## 2015-08-08 DIAGNOSIS — M059 Rheumatoid arthritis with rheumatoid factor, unspecified: Secondary | ICD-10-CM | POA: Diagnosis not present

## 2015-08-08 DIAGNOSIS — M1990 Unspecified osteoarthritis, unspecified site: Secondary | ICD-10-CM | POA: Diagnosis not present

## 2015-08-08 DIAGNOSIS — M25511 Pain in right shoulder: Secondary | ICD-10-CM | POA: Diagnosis not present

## 2015-08-08 DIAGNOSIS — M25571 Pain in right ankle and joints of right foot: Secondary | ICD-10-CM | POA: Diagnosis not present

## 2015-08-15 ENCOUNTER — Other Ambulatory Visit: Payer: Self-pay | Admitting: Internal Medicine

## 2015-08-17 ENCOUNTER — Other Ambulatory Visit: Payer: Self-pay | Admitting: Internal Medicine

## 2015-08-29 ENCOUNTER — Telehealth: Payer: Self-pay

## 2015-08-29 ENCOUNTER — Ambulatory Visit (INDEPENDENT_AMBULATORY_CARE_PROVIDER_SITE_OTHER): Payer: Medicare Other | Admitting: Internal Medicine

## 2015-08-29 ENCOUNTER — Encounter: Payer: Self-pay | Admitting: Internal Medicine

## 2015-08-29 VITALS — BP 162/80 | HR 70 | Temp 101.0°F | Resp 20 | Ht 61.0 in | Wt 165.0 lb

## 2015-08-29 DIAGNOSIS — Z862 Personal history of diseases of the blood and blood-forming organs and certain disorders involving the immune mechanism: Secondary | ICD-10-CM | POA: Diagnosis not present

## 2015-08-29 DIAGNOSIS — B3731 Acute candidiasis of vulva and vagina: Secondary | ICD-10-CM

## 2015-08-29 DIAGNOSIS — Z8739 Personal history of other diseases of the musculoskeletal system and connective tissue: Secondary | ICD-10-CM | POA: Diagnosis not present

## 2015-08-29 DIAGNOSIS — R509 Fever, unspecified: Secondary | ICD-10-CM | POA: Diagnosis not present

## 2015-08-29 DIAGNOSIS — J209 Acute bronchitis, unspecified: Secondary | ICD-10-CM | POA: Diagnosis not present

## 2015-08-29 DIAGNOSIS — B373 Candidiasis of vulva and vagina: Secondary | ICD-10-CM

## 2015-08-29 DIAGNOSIS — B349 Viral infection, unspecified: Secondary | ICD-10-CM

## 2015-08-29 DIAGNOSIS — D649 Anemia, unspecified: Secondary | ICD-10-CM

## 2015-08-29 LAB — CBC WITH DIFFERENTIAL/PLATELET
Basophils Absolute: 0 cells/uL (ref 0–200)
Basophils Relative: 0 %
Eosinophils Absolute: 228 cells/uL (ref 15–500)
Eosinophils Relative: 3 %
HCT: 31.5 % — ABNORMAL LOW (ref 35.0–45.0)
Hemoglobin: 10.5 g/dL — ABNORMAL LOW (ref 11.7–15.5)
Lymphocytes Relative: 15 %
Lymphs Abs: 1140 cells/uL (ref 850–3900)
MCH: 32.8 pg (ref 27.0–33.0)
MCHC: 33.3 g/dL (ref 32.0–36.0)
MCV: 98.4 fL (ref 80.0–100.0)
MPV: 9.3 fL (ref 7.5–12.5)
Monocytes Absolute: 1292 cells/uL — ABNORMAL HIGH (ref 200–950)
Monocytes Relative: 17 %
Neutro Abs: 4940 cells/uL (ref 1500–7800)
Neutrophils Relative %: 65 %
Platelets: 444 10*3/uL — ABNORMAL HIGH (ref 140–400)
RBC: 3.2 MIL/uL — ABNORMAL LOW (ref 3.80–5.10)
RDW: 15.1 % — ABNORMAL HIGH (ref 11.0–15.0)
WBC: 7.6 10*3/uL (ref 3.8–10.8)

## 2015-08-29 LAB — INFLUENZA A AND B AG, IMMUNOASSAY
Influenza A Antigen: NOT DETECTED
Influenza B Antigen: NOT DETECTED

## 2015-08-29 MED ORDER — FLUCONAZOLE 150 MG PO TABS: 150.0000 mg | ORAL_TABLET | Freq: Once | ORAL | Status: DC

## 2015-08-29 MED ORDER — LEVOFLOXACIN 500 MG PO TABS: 500.0000 mg | ORAL_TABLET | Freq: Every day | ORAL | Status: DC

## 2015-08-29 MED ORDER — BENZONATATE 100 MG PO CAPS: 200.0000 mg | ORAL_CAPSULE | Freq: Three times a day (TID) | ORAL | Status: DC | PRN

## 2015-08-29 NOTE — Progress Notes (Signed)
   Subjective:    Patient ID: [name], female    DOB: [birth year], 75 y.o.   MRN: [id number]  HPI 75 year old Black  Female in today with chills, fever, cough with discolored sputum production. Has malaise and fatigue. Has myalgias. Had onset of symptoms Sunday, April 2. Did take flu vaccine. History of diabetes mellitus, rheumatoid arthritis, hypertension, hyperlipidemia. Complaining of vaginal itching. Recently was diagnosed with anemia. Saw  gastroenterologist for evaluation.    Review of Systems     Objective:   Physical Exam Sitting in exam room with jacket and on. Appears to be chilled. Moves slowly. Pharynx is clear. TMs are clear. Neck is supple. No significant adenopathy. Chest clear to auscultation without rales or wheezing.Extremities without edema. Cardiac exam regular rate and rhythm normal S1 and S2.  CBC Ordered and rapid flu test obtained       Assessment & Plan:  Viral syndrome   Acute bronchitis  History of anemia  Rheumatoid arthritis  Controlled type 2 diabetes mellitus  Candida vaginitis  Plan: Levaquin 500 milligrams daily for 10 days. Tessalon Perles 200 mg 3 times daily as needed for cough. Rest and drink plenty of fluids. Diflucan 150 mg tablet with one refill for Candida vaginitis  25 minutes spent with patient  Rapid flu test is negative. White blood cell count is normal. Hemoglobin stable at 10.5 g

## 2015-08-29 NOTE — Patient Instructions (Addendum)
CBC is within normal limits and rapid flu test is negative. Levaquin 500 milligrams daily for 10 days. Tessalon Perles 200 mg 3 times daily as needed for cough. Diflucan 150 mg tablet with one refill for Candida vaginitis. Rest and drink plenty of fluids.

## 2015-08-29 NOTE — Telephone Encounter (Signed)
-----   Message from Tanya Showers, MD sent at 08/29/2015 12:34 PM EDT ----- Pt remains anemic and  has had GI work up. Now  needs Hematology evaluation. Please make referral Flu test is negative

## 2015-09-03 ENCOUNTER — Telehealth: Payer: Self-pay | Admitting: Internal Medicine

## 2015-09-03 MED ORDER — TERCONAZOLE 0.8 % VA CREA
1.0000 | TOPICAL_CREAM | Freq: Every day | VAGINAL | Status: DC
Start: 2015-09-03 — End: 2016-10-19

## 2015-09-03 NOTE — Telephone Encounter (Signed)
Vaginal itching.  She has used Terazole in the past.  Spoke with patient and she wants to have the Terazole once again please.

## 2015-09-03 NOTE — Telephone Encounter (Signed)
Patient states that she took the Diflucan but she is still itching.  States that she would rather have the Triamcinolone Cream because it stops the itching much better and quicker than the pill.  She has 4 more days of the Levaquin to take and states she's itching very badly.  Please advise.    Pharmacy:  [facility] @ [place]  Thank you.

## 2015-09-03 NOTE — Telephone Encounter (Signed)
Where is she itching? Cannot use Triamcinolone vaginally too strong and is a steroid. May have Terazole if wanting cream for vaginal itching

## 2015-09-03 NOTE — Telephone Encounter (Signed)
Refill Terazole for one year

## 2015-09-05 ENCOUNTER — Telehealth: Payer: Self-pay | Admitting: Oncology

## 2015-09-05 NOTE — Telephone Encounter (Signed)
Gave referral to Dr. Jana Hakim for review. Per pt request.

## 2015-09-06 ENCOUNTER — Other Ambulatory Visit: Payer: Self-pay | Admitting: Oncology

## 2015-09-06 ENCOUNTER — Telehealth: Payer: Self-pay | Admitting: Oncology

## 2015-09-06 DIAGNOSIS — D508 Other iron deficiency anemias: Secondary | ICD-10-CM

## 2015-09-06 DIAGNOSIS — M069 Rheumatoid arthritis, unspecified: Secondary | ICD-10-CM

## 2015-09-06 NOTE — Telephone Encounter (Signed)
Spoke with patient to confirm April and May appts per GM 4/13 pof. Pt aware of date/time

## 2015-09-10 DIAGNOSIS — R109 Unspecified abdominal pain: Secondary | ICD-10-CM | POA: Diagnosis not present

## 2015-09-10 DIAGNOSIS — D500 Iron deficiency anemia secondary to blood loss (chronic): Secondary | ICD-10-CM | POA: Diagnosis not present

## 2015-09-11 ENCOUNTER — Encounter: Payer: Self-pay | Admitting: Oncology

## 2015-09-11 ENCOUNTER — Telehealth: Payer: Self-pay | Admitting: Oncology

## 2015-09-11 NOTE — Telephone Encounter (Signed)
Mailed new pt packet

## 2015-09-12 ENCOUNTER — Other Ambulatory Visit: Payer: Self-pay | Admitting: Internal Medicine

## 2015-09-17 ENCOUNTER — Other Ambulatory Visit (HOSPITAL_BASED_OUTPATIENT_CLINIC_OR_DEPARTMENT_OTHER): Payer: Medicare Other

## 2015-09-17 ENCOUNTER — Telehealth: Payer: Self-pay | Admitting: Oncology

## 2015-09-17 ENCOUNTER — Other Ambulatory Visit: Payer: Self-pay

## 2015-09-17 DIAGNOSIS — M069 Rheumatoid arthritis, unspecified: Secondary | ICD-10-CM

## 2015-09-17 DIAGNOSIS — D508 Other iron deficiency anemias: Secondary | ICD-10-CM

## 2015-09-17 LAB — CBC & DIFF AND RETIC
BASO%: 0.6 % (ref 0.0–2.0)
Basophils Absolute: 0 10*3/uL (ref 0.0–0.1)
EOS%: 5.1 % (ref 0.0–7.0)
Eosinophils Absolute: 0.4 10*3/uL (ref 0.0–0.5)
HCT: 33.1 % — ABNORMAL LOW (ref 34.8–46.6)
HGB: 11 g/dL — ABNORMAL LOW (ref 11.6–15.9)
Immature Retic Fract: 15.8 % — ABNORMAL HIGH (ref 1.60–10.00)
LYMPH%: 27.9 % (ref 14.0–49.7)
MCH: 32.3 pg (ref 25.1–34.0)
MCHC: 33.2 g/dL (ref 31.5–36.0)
MCV: 97.1 fL (ref 79.5–101.0)
MONO#: 0.5 10*3/uL (ref 0.1–0.9)
MONO%: 7.3 % (ref 0.0–14.0)
NEUT#: 4.1 10*3/uL (ref 1.5–6.5)
NEUT%: 59.1 % (ref 38.4–76.8)
Platelets: 328 10*3/uL (ref 145–400)
RBC: 3.41 10*6/uL — ABNORMAL LOW (ref 3.70–5.45)
RDW: 16.1 % — ABNORMAL HIGH (ref 11.2–14.5)
Retic %: 1.66 % (ref 0.70–2.10)
Retic Ct Abs: 56.61 10*3/uL (ref 33.70–90.70)
WBC: 6.9 10*3/uL (ref 3.9–10.3)
lymph#: 1.9 10*3/uL (ref 0.9–3.3)
nRBC: 0 % (ref 0–0)

## 2015-09-17 LAB — IRON AND TIBC
%SAT: 23 % (ref 21–57)
Iron: 76 ug/dL (ref 41–142)
TIBC: 333 ug/dL (ref 236–444)
UIBC: 257 ug/dL (ref 120–384)

## 2015-09-17 LAB — CHCC SMEAR

## 2015-09-17 LAB — FERRITIN: Ferritin: 164 ng/ml (ref 9–269)

## 2015-09-17 NOTE — Progress Notes (Signed)
Patient was here today for labs.  She was stuck many times per Maudie Mercury, however lab was unable to obtain her sedimentation rate.

## 2015-09-17 NOTE — Telephone Encounter (Signed)
s.w pt and advised on May appt.....pt ok and aware °

## 2015-09-18 LAB — SEDIMENTATION RATE: Sedimentation Rate-Westergren: 69 mm/hr — ABNORMAL HIGH (ref 0–40)

## 2015-09-18 LAB — ANTINUCLEAR ANTIBODIES, IFA: ANA Titer 1: NEGATIVE

## 2015-09-18 LAB — RHEUMATOID FACTOR: RA Latex Turbid.: 30.4 IU/mL — ABNORMAL HIGH (ref 0.0–13.9)

## 2015-10-01 ENCOUNTER — Ambulatory Visit (HOSPITAL_BASED_OUTPATIENT_CLINIC_OR_DEPARTMENT_OTHER): Payer: Medicare Other | Admitting: Oncology

## 2015-10-01 ENCOUNTER — Other Ambulatory Visit (HOSPITAL_BASED_OUTPATIENT_CLINIC_OR_DEPARTMENT_OTHER): Payer: Medicare Other

## 2015-10-01 VITALS — BP 129/54 | HR 71 | Temp 97.8°F | Resp 18 | Ht 61.0 in | Wt 166.4 lb

## 2015-10-01 DIAGNOSIS — D6489 Other specified anemias: Secondary | ICD-10-CM | POA: Diagnosis not present

## 2015-10-01 DIAGNOSIS — N183 Chronic kidney disease, stage 3 unspecified: Secondary | ICD-10-CM | POA: Insufficient documentation

## 2015-10-01 DIAGNOSIS — C7A00 Malignant carcinoid tumor of unspecified site: Secondary | ICD-10-CM

## 2015-10-01 DIAGNOSIS — R740 Nonspecific elevation of levels of transaminase and lactic acid dehydrogenase [LDH]: Secondary | ICD-10-CM | POA: Diagnosis not present

## 2015-10-01 DIAGNOSIS — D649 Anemia, unspecified: Secondary | ICD-10-CM

## 2015-10-01 DIAGNOSIS — Z9189 Other specified personal risk factors, not elsewhere classified: Secondary | ICD-10-CM

## 2015-10-01 DIAGNOSIS — M069 Rheumatoid arthritis, unspecified: Secondary | ICD-10-CM

## 2015-10-01 DIAGNOSIS — M0579 Rheumatoid arthritis with rheumatoid factor of multiple sites without organ or systems involvement: Secondary | ICD-10-CM

## 2015-10-01 LAB — CBC WITH DIFFERENTIAL/PLATELET
BASO%: 0.6 % (ref 0.0–2.0)
Basophils Absolute: 0 10*3/uL (ref 0.0–0.1)
EOS%: 2.9 % (ref 0.0–7.0)
Eosinophils Absolute: 0.2 10*3/uL (ref 0.0–0.5)
HCT: 31 % — ABNORMAL LOW (ref 34.8–46.6)
HGB: 10.4 g/dL — ABNORMAL LOW (ref 11.6–15.9)
LYMPH%: 35.8 % (ref 14.0–49.7)
MCH: 32.3 pg (ref 25.1–34.0)
MCHC: 33.5 g/dL (ref 31.5–36.0)
MCV: 96.3 fL (ref 79.5–101.0)
MONO#: 0.7 10*3/uL (ref 0.1–0.9)
MONO%: 9.3 % (ref 0.0–14.0)
NEUT#: 3.6 10*3/uL (ref 1.5–6.5)
NEUT%: 51.4 % (ref 38.4–76.8)
Platelets: 482 10*3/uL — ABNORMAL HIGH (ref 145–400)
RBC: 3.22 10*6/uL — ABNORMAL LOW (ref 3.70–5.45)
RDW: 15.6 % — ABNORMAL HIGH (ref 11.2–14.5)
WBC: 7 10*3/uL (ref 3.9–10.3)
lymph#: 2.5 10*3/uL (ref 0.9–3.3)
nRBC: 0 % (ref 0–0)

## 2015-10-01 LAB — CHCC SMEAR

## 2015-10-01 LAB — LACTATE DEHYDROGENASE: LDH: 263 U/L — ABNORMAL HIGH (ref 125–245)

## 2015-10-01 NOTE — Progress Notes (Signed)
[name]  [phone] [phone]     ID: ARENA LINDAHL DOB: [birth year]  MR#: [id number]  MEB#:[id number]  Patient Care Team: [name], MD as PCP - General PCP: [name], MD GYN: SU:  OTHER MD:  CHIEF COMPLAINT: anemia  CURRENT TREATMENT: observation  HISTORY OF PRESENT ILLNESS:  I have hemoglobin values going back to 2012. Most are in the range of 9.5 - 10.5. MCV is normal. Ferritin has been normal as well as vitamin B-12 and folate.  Results for DARCIE, West (MRN [id number]) as of 10/01/2015 06:52  Ref. Range 06/17/2010 04:59 09/17/2015 09:50  Ferritin Latest Ref Range: 9-269 ng/ml 380 (H) 164  Results for TEEGHAN, West (MRN [id number]) as of 10/01/2015 06:52  Ref. Range 06/17/2010 04:59 05/10/2015 12:45  Vitamin B12 Latest Ref Range: 211-911 pg/mL 386 928 (H)  Results for [name] (MRN [id number]) as of 10/01/2015 06:52  Ref. Range 06/17/2010 04:59 05/10/2015 12:45  Folate Latest Units: ng/mL 10.3... >20.0   Differential has been unremarkable except for a slight rise in monocytes.  Results for [name] (MRN [id number]) as of 10/01/2015 06:52  Ref. Range 05/28/2010 10:46 06/14/2010 15:05 07/05/2010 11:09 03/17/2013 11:00 06/04/2013 10:30 06/09/2013 12:35 02/08/2014 09:29 03/20/2014 10:38 05/05/2014 09:13 05/10/2015 12:45 06/07/2015 12:21 08/29/2015 11:20 09/17/2015 09:50  Monocyte # Latest Ref Range: 200-950 cells/uL 0.7 1.2 (H) 0.7 0.3 0.2 2.9 (H) 0.6 0.8 0.8 1.1 (H) 1.2 (H) 1292 (H)    GI workup has included colonoscopy 04/05/2013 which showed small sigmoid diverticuli. EGD 11/16/2012 showed an esophageal stricture and gastritis. Repeat EGD 02/20 2017 showed chronic gastritic, no H pylori (([id number])  INTERVAL HISTORY:  Saleen Peden  Was evaluated in the hematology clinic 10/01/2015  REVIEW OF SYSTEMS:  the patient denies shortness of breath, feeling faint, passing out, palpitations, or weakness. She "aches all over"  And  has pain in her fingers hands wrists shoulders and hips particularly.  She takes nonsteroidals and Tylenol for the pain. Despite the pain she is as active as she can be she says and exercises regularlyShe denies rash, fever, or bleeding. She has numbness in 3 fingers of her left hand which she dates back to left shoulder surgery. She denies unusual headaches, visual changes, nausea, vomiting, cough, phlegm production, or pleurisy. She denies change in bowel habits, black or tarry stools, or blood in her stools. A detailed review of systems today was otherwise stable according to the patient  PAST MEDICAL HISTORY: Past Medical History  Diagnosis Date  . IBS (irritable bowel syndrome)   . GERD (gastroesophageal reflux disease)   . History of estrogen therapy   . Seborrhea     ear  . Dysphasia   . Mitral regurgitation   . Cervical stenosis of spine   . Hypercholesteremia   . Glaucoma   . Essential hypertension, benign   . Arthritis   . Rheumatoid arthritis ([place])   . Heart murmur   . Pulmonary air embolism ([place])     states she had this wiht her back surgery    PAST SURGICAL HISTORY: Past Surgical History  Procedure Laterality Date  . Lumbar fusion  05/2010  . Tubal ligation    . Toe surgery    . Cataract extraction Bilateral   . Colonoscopy    . Reverse shoulder arthroplasty Left 02/16/2014    Procedure: LEFT REVERSE TOTAL SHOULDER ARTHROPLASTY;  Surgeon: Marin Shutter, MD;  Location: [facility];  Service:  Orthopedics;  Laterality: Left;  Marland Kitchen Eye surgery    . Laparoscopic assisted vaginal hysterectomy N/A 01/08/2015    Procedure: LAPAROSCOPIC ASSISTED VAGINAL HYSTERECTOMY;  Surgeon: Molli Posey, MD;  Location: [facility];  Service: Gynecology;  Laterality: N/A;  . Anterior and posterior repair with sacrospinous fixation N/A 01/08/2015    Procedure: ANTERIOR AND POSTERIOR REPAIR WITH possible SACROSPINOUS FIXATION;  Surgeon: Molli Posey, MD;  Location: [facility];  Service: Gynecology;   Laterality: N/A;    FAMILY HISTORY Family History  Problem Relation Age of Onset  . Healthy Daughter    the patient's father died in his 40s from reasons not clear to the patient. The patient's mother died at age 21 from a heart attack. The patient had 2 brothers, 6 sisters. There is no history of cancer in the immediate family except for one of the patient's daughter who she tells me had biliary duct cancer in her 4s  GYNECOLOGIC HISTORY:  No LMP recorded. Patient has had a hysterectomy.  menarche age 34, first live birth age 57, the patient is [place] P9. She stopped having periods in her 61s. She did take hormone replacement for several years. She underwent hysterectomy with bilateral salpingo-oophorectomy for prolapse 01/08/2015 with benign pathology  SOCIAL HISTORY:   [name] used to work at [employer], but is now retired. She is a widow. At home is just she and her daughter [name] who is disabled secondary to COPD the patient has "a whole bunch" of grandchildren, and "abundance of great-grandchildren, and there is still coming". She attends a local [employer]: Social History  Substance Use Topics  . Smoking status: Never Smoker   . Smokeless tobacco: Never Used  . Alcohol Use: No     Allergies  Allergen Reactions  . Amoxicillin-Pot Clavulanate Diarrhea and Itching    Pt states that she does not remember if she is allergic to this medication   . Latex Itching    Latex gloves   . Tramadol Nausea And Vomiting    Current Outpatient Prescriptions  Medication Sig Dispense Refill  . acetaminophen (TYLENOL) 650 MG CR tablet Take 650 mg by mouth daily as needed for pain.     Marland Kitchen aspirin 81 MG tablet Take 81 mg by mouth daily.     Marland Kitchen ibuprofen (ADVIL,MOTRIN) 600 MG tablet Take 1 tablet (600 mg total) by mouth every 6 (six) hours as needed. 30 tablet 0  . Iron-FA-B Cmp-C-Biot-Probiotic (FUSION PLUS) CAPS TK 1 C PO QD  3  . lisinopril (PRINIVIL,ZESTRIL) 5 MG  tablet TAKE 1 TABLET BY MOUTH DAILY 90 tablet 3  . mupirocin ointment (BACTROBAN) 2 % APPLY TO THE AFFECTED AREA TWICE DAILY AS NEEDED 22 g 0  . omeprazole (PRILOSEC) 20 MG capsule TAKE 1 CAPSULE BY MOUTH DAILY 30 capsule 11  . PATADAY 0.2 % SOLN Place 1 drop into both eyes daily as needed (for itching).   4  . polyethylene glycol powder (GLYCOLAX/MIRALAX) powder MIX 1 CAPFUL IN 8 OUNCES OF WATER AND DRINK ONCE DAILY AS DIRECTED 527 g PRN  . simvastatin (ZOCOR) 20 MG tablet TAKE 1 TABLET BY MOUTH EVERY NIGHT AT BEDTIME 90 tablet 3  . terconazole (TERAZOL 3) 0.8 % vaginal cream Place 1 applicator vaginally at bedtime. 20 g 11  . triamcinolone cream (KENALOG) 0.1 % APPLY EXTERNALLY TO THE AFFECTED AREA THREE TIMES DAILY 45 g 2  . triamterene-hydrochlorothiazide (MAXZIDE) 75-50 MG tablet TAKE 1 TABLET BY MOUTH EVERY DAY  90 tablet 3   No current facility-administered medications for this visit.    OBJECTIVE:  Older African-American woman in no acute distress Filed Vitals:   10/01/15 1631  BP: 129/54  Pulse: 71  Temp: 97.8 F (36.6 C)  Resp: 18     Body mass index is 31.46 kg/(m^2).    ECOG FS:1 - Symptomatic but completely ambulatory  Ocular: Sclerae unicteric,  Bilateral arcus senilis Ear-nose-throat: Oropharynx clear and moist Lymphatic: No cervical or supraclavicular adenopathy Lungs no rales or rhonchi, good excursion bilaterally Heart regular rate and rhythm, no murmur appreciated Abd soft, nontender, positive bowel sounds, no splenomegaly MSK no focal spinal tenderness, no joint erythema or gross edema Neuro: non-focal, well-oriented,  positive affect Breasts:  deferred   LAB RESULTS:  CMP     Component Value Date/Time   NA 136 06/07/2015 1221   K 4.4 06/07/2015 1221   CL 100 06/07/2015 1221   CO2 21 06/07/2015 1221   GLUCOSE 84 06/07/2015 1221   BUN 28* 06/07/2015 1221   CREATININE 1.32* 06/07/2015 1221   CREATININE 1.09* 01/01/2015 0845   CALCIUM 9.5 06/07/2015 1221    PROT 7.1 05/10/2015 1245   ALBUMIN 3.7 05/10/2015 1245   AST 23 05/10/2015 1245   ALT 12 05/10/2015 1245   ALKPHOS 79 05/10/2015 1245   BILITOT 0.4 05/10/2015 1245   GFRNONAA 38* 05/10/2015 1245   GFRNONAA 48* 01/01/2015 0845   GFRAA 44* 05/10/2015 1245   GFRAA 56* 01/01/2015 0845    INo results found for: SPEP, UPEP  Lab Results  Component Value Date   WBC 7.0 10/01/2015   NEUTROABS 3.6 10/01/2015   HGB 10.4* 10/01/2015   HCT 31.0* 10/01/2015   MCV 96.3 10/01/2015   PLT 482* 10/01/2015      Chemistry      Component Value Date/Time   NA 136 06/07/2015 1221   K 4.4 06/07/2015 1221   CL 100 06/07/2015 1221   CO2 21 06/07/2015 1221   BUN 28* 06/07/2015 1221   CREATININE 1.32* 06/07/2015 1221   CREATININE 1.09* 01/01/2015 0845      Component Value Date/Time   CALCIUM 9.5 06/07/2015 1221   ALKPHOS 79 05/10/2015 1245   AST 23 05/10/2015 1245   ALT 12 05/10/2015 1245   BILITOT 0.4 05/10/2015 1245       No results found for: LABCA2  No components found for: [id number]  No results for input(s): INR in the last 168 hours.  Urinalysis    Component Value Date/Time   COLORURINE YELLOW 07/05/2010 1712   APPEARANCEUR CLEAR 07/05/2010 1712   LABSPEC 1.007 07/05/2010 1712   PHURINE 6.0 07/05/2010 1712   HGBUR NEGATIVE 07/05/2010 1712   BILIRUBINUR neg 03/20/2014 1125   BILIRUBINUR NEGATIVE 07/05/2010 1712   KETONESUR NEGATIVE 07/05/2010 1712   PROTEINUR neg 03/20/2014 1125   PROTEINUR NEGATIVE 07/05/2010 1712   UROBILINOGEN negative 03/20/2014 1125   UROBILINOGEN 0.2 07/05/2010 1712   NITRITE neg 03/20/2014 1125   NITRITE NEGATIVE 07/05/2010 1712   LEUKOCYTESUR Negative 03/20/2014 1125      ELIGIBLE FOR AVAILABLE RESEARCH PROTOCOL: no  STUDIES: No results found.   ASSESSMENT: 75 y.o. [place] with longstanding normocytic anemia, normal B-2, folate and ferritin, and negative GI workup.  Additional labs show an inadequate reticulocyte response, a high  sed rate, and low iron saturation in the face of normal ferritin, c/w anemia of chronic inflammation. There is also an element of anemia of renal failure (EGFR 38 mL/min)  Results  for JANIRA, MANDELL (MRN [id number]) as of 10/01/2015 06:52  Ref. Range 09/17/2015 09:50  Retic Ct Abs Latest Ref Range: 33.70-90.70 10e3/uL 56.61           Sedimentation Rate-Westergren 0 - 40 mm/hr 69 (H) 94 (H)R   Resulting Agency           Iron 41 - 142 ug/dL 76 21 (L)R 60R 32 (L)R     TIBC 236 - 444 ug/dL 333 296R 339R 198 (L)R    UIBC 120 - 384 ug/dL 257 275R 279R 166R    %SAT 21 - 57 % 23 7 (L)R 18R         Additional labs include an LDH of 263 (upper normal 245 and this lab) with a total bilirubin of 0.4 when last checked  REVIEW OF BLOOD FILM shows no schistocytes or spherocytes. Rouleaux are prominent. There is no left shift in the granulocytic series. Monocytes are morphologically normal. No platelet clumps noted  PLAN  Ms Rolston's anemia is moderate. Despite the mildly elevated LDH, there is no evidence of hemolysis (low total bilirubin, inappropriatelylow reticulocyte count, no evidence of hemolysis on smear). ESR is elevated as is the rheumatoid factor and the patient is known to have a diagnosis of rheumatoid arthritis. She does have diffuse symmetrical aches and pains.This is likely the cause of her inflammation.   The results of chronic inflammation it is to sequester iron stores in a way that is not accessible to the bone marrow for red cell production and this becomes a limiting factor in reticulocyte production. The patient is not iron deficient and further iron supplementation would not improve the problem.   The way to mobilize the patient's iron stores is to bring her inflammation under control. This could be done with something as simple as prednisone, but the patient tells me she has tried that drug in the past and "hates it". I think we can best address this issue by referring the  patient back to her rheumatologist and she already has an appointment with her in June.  A second reason for Shaylynne's anemia is her chronic kidney disease stage IIIb.  This means she is making less erythropoietin than normal and there is an element of erythropoietin resistance as well. If her anemia became more severe she could be considered for erythropoietin replacement.   I discussed all this with the patient and gave her the information in writing. At this point my suggestion is that she go back to her rheumatologist as planned to see if treatment of her rheumatoid arthritis results in improvement in her reticulocyte count and hemoglobin. If it does not, and the anemia  becomes symptomatic, I would suggest the patient be started on iron as 200 g every 3 weeks as a starting dose.   I will be glad to see Ms Silberstein again as needed but at this point I have not made her a return appointment here.     Chauncey Cruel, MD   10/01/2015 5:35 PM Medical Oncology and Hematology [facility] [address] [place], [place] [address] Tel. [phone]    Fax. [phone]

## 2015-10-02 LAB — C-REACTIVE PROTEIN: CRP: 3.9 mg/L (ref 0.0–4.9)

## 2015-10-12 DIAGNOSIS — Z1231 Encounter for screening mammogram for malignant neoplasm of breast: Secondary | ICD-10-CM | POA: Diagnosis not present

## 2015-10-25 ENCOUNTER — Other Ambulatory Visit: Payer: Self-pay | Admitting: Internal Medicine

## 2015-10-30 DIAGNOSIS — H524 Presbyopia: Secondary | ICD-10-CM | POA: Diagnosis not present

## 2015-10-30 DIAGNOSIS — H402230 Chronic angle-closure glaucoma, bilateral, stage unspecified: Secondary | ICD-10-CM | POA: Diagnosis not present

## 2015-10-30 DIAGNOSIS — H1013 Acute atopic conjunctivitis, bilateral: Secondary | ICD-10-CM | POA: Diagnosis not present

## 2015-12-03 ENCOUNTER — Telehealth: Payer: Self-pay

## 2015-12-03 NOTE — Telephone Encounter (Signed)
Monday 12/17/15 at 12:00

## 2015-12-03 NOTE — Telephone Encounter (Signed)
Patient contacted office stating that she wants to know if she should continue the iron supplements. She went to [facility] and her iron levels were good. If she does, then will it be ok to get an OTC supplement instead of the prescription as it is expensive.

## 2015-12-03 NOTE — Telephone Encounter (Signed)
She needs appt next week to discuss this and we need to find her GI work up from Dr. Lizbeth Bark  At [facility] for me to review. See if we have it here or you may have to call for it

## 2015-12-04 DIAGNOSIS — M19071 Primary osteoarthritis, right ankle and foot: Secondary | ICD-10-CM | POA: Diagnosis not present

## 2015-12-04 DIAGNOSIS — M1990 Unspecified osteoarthritis, unspecified site: Secondary | ICD-10-CM | POA: Diagnosis not present

## 2015-12-04 DIAGNOSIS — M25511 Pain in right shoulder: Secondary | ICD-10-CM | POA: Diagnosis not present

## 2015-12-04 DIAGNOSIS — M059 Rheumatoid arthritis with rheumatoid factor, unspecified: Secondary | ICD-10-CM | POA: Diagnosis not present

## 2015-12-04 DIAGNOSIS — M25571 Pain in right ankle and joints of right foot: Secondary | ICD-10-CM | POA: Diagnosis not present

## 2015-12-06 DIAGNOSIS — M25511 Pain in right shoulder: Secondary | ICD-10-CM | POA: Diagnosis not present

## 2015-12-06 DIAGNOSIS — M1990 Unspecified osteoarthritis, unspecified site: Secondary | ICD-10-CM | POA: Diagnosis not present

## 2015-12-06 DIAGNOSIS — M059 Rheumatoid arthritis with rheumatoid factor, unspecified: Secondary | ICD-10-CM | POA: Diagnosis not present

## 2015-12-06 DIAGNOSIS — M25571 Pain in right ankle and joints of right foot: Secondary | ICD-10-CM | POA: Diagnosis not present

## 2015-12-17 ENCOUNTER — Encounter: Payer: Self-pay | Admitting: Internal Medicine

## 2015-12-17 ENCOUNTER — Ambulatory Visit (INDEPENDENT_AMBULATORY_CARE_PROVIDER_SITE_OTHER): Payer: Medicare Other | Admitting: Internal Medicine

## 2015-12-17 VITALS — BP 128/68 | Temp 97.7°F | Ht 61.0 in | Wt 164.0 lb

## 2015-12-17 DIAGNOSIS — Z8739 Personal history of other diseases of the musculoskeletal system and connective tissue: Secondary | ICD-10-CM | POA: Diagnosis not present

## 2015-12-17 DIAGNOSIS — D649 Anemia, unspecified: Secondary | ICD-10-CM | POA: Diagnosis not present

## 2015-12-17 DIAGNOSIS — N183 Chronic kidney disease, stage 3 unspecified: Secondary | ICD-10-CM

## 2015-12-17 DIAGNOSIS — Z862 Personal history of diseases of the blood and blood-forming organs and certain disorders involving the immune mechanism: Secondary | ICD-10-CM

## 2015-12-17 DIAGNOSIS — I10 Essential (primary) hypertension: Secondary | ICD-10-CM | POA: Diagnosis not present

## 2015-12-17 LAB — CBC WITH DIFFERENTIAL/PLATELET
Basophils Absolute: 99 cells/uL (ref 0–200)
Basophils Relative: 1 %
Eosinophils Absolute: 198 cells/uL (ref 15–500)
Eosinophils Relative: 2 %
HCT: 33.9 % — ABNORMAL LOW (ref 35.0–45.0)
Hemoglobin: 11 g/dL — ABNORMAL LOW (ref 11.7–15.5)
Lymphocytes Relative: 20 %
Lymphs Abs: 1980 cells/uL (ref 850–3900)
MCH: 32.2 pg (ref 27.0–33.0)
MCHC: 32.4 g/dL (ref 32.0–36.0)
MCV: 99.1 fL (ref 80.0–100.0)
MPV: 8.8 fL (ref 7.5–12.5)
Monocytes Absolute: 792 cells/uL (ref 200–950)
Monocytes Relative: 8 %
Neutro Abs: 6831 cells/uL (ref 1500–7800)
Neutrophils Relative %: 69 %
Platelets: 543 10*3/uL — ABNORMAL HIGH (ref 140–400)
RBC: 3.42 MIL/uL — ABNORMAL LOW (ref 3.80–5.10)
RDW: 14.9 % (ref 11.0–15.0)
WBC: 9.9 10*3/uL (ref 3.8–10.8)

## 2015-12-17 LAB — IRON AND TIBC
%SAT: 19 % (ref 11–50)
Iron: 66 ug/dL (ref 45–160)
TIBC: 343 ug/dL (ref 250–450)
UIBC: 277 ug/dL (ref 125–400)

## 2015-12-19 ENCOUNTER — Telehealth: Payer: Self-pay

## 2015-12-19 NOTE — Telephone Encounter (Signed)
-----   Message from [name], MD sent at 12/18/2015  9:15 AM EDT ----- Please cal pt. Her iron level is stable at 66 better than 6 months ago when it was 21. Continue iron prescription Dr. Watt Climes gave her indefinitely. Hgb stable at 11 grams.

## 2015-12-19 NOTE — Telephone Encounter (Signed)
Called patient. Gave lab results. Patient verbalized understanding.  

## 2015-12-22 ENCOUNTER — Encounter: Payer: Self-pay | Admitting: Internal Medicine

## 2015-12-22 NOTE — Patient Instructions (Addendum)
Continue iron supplementation as prescribed but Dr. Watt Climes.  Your hemoglobin has improved. Patient needs physical exam within the next 6 months.

## 2015-12-22 NOTE — Progress Notes (Signed)
   Subjective:    Patient ID: [name], female    DOB: 09/23/1940, 75 y.o.   MRN: [id number]  Anemia    75 year old Black Female with history of iron deficiency anemia that was diagnosed last year initially by GYN physician. In August 2016 she had a laparoscopic vaginal hysterectomy, BSO and A&P repair. This was due to prolapse. Hemoglobin was 11.6 g August 8 and according to GYN notes was 10 g with an MCV of 97.7 and normal differential in December. White blood cell count was 9700 and platelet count was 603,000. She did not complain of pica. She complained of decreased appetite. No improvement in anemia with multivitamin with iron. She had undergone colonoscopy in 2014 which was normal at [facility]. She subsequently has seen Dr. Watt Climes who put her on an iron supplement that she can tolerate. It is by prescription and somewhat expensive but it works for her and she should stay on it.  Other medical problems include hypertension, hyperlipidemia, controlled type 2 diabetes mellitus without complication, anxiety, irritable bowel syndrome, status post left shoulder replacement, chronic arthropathy of right shoulder, GE reflux. She has rheumatoid arthritis treated by rheumatologist. History of H. pylori gastritis July 2000.  Cannot tolerate methotrexate for rheumatoid arthritis due to persistent recurrent mouth ulcers  I referred her to Dr. Jana Hakim, Hematologist to exclude some  underlying malignancy causing the anemia. He felt her anemia was aggravated by rheumatoid arthritis. He recommended she stay on iron supplement. He noted her peripheral smear review was negative. He felt that her anemia was due to chronic inflammation sequestering iron stores. He felt she really was not truly iron deficient. Also reminded [place] that she has chronic kidney disease and may not be making erythropoietin.  She feels well at this point with the exception of the usual joint pain from rheumatoid arthritis. Her  hemoglobin has improved from 9.7 in August 2016 now to 11 g. MCV is stable at 99.1. White blood cell count and platelet count are normal. Iron level is 66. 3 months ago her iron level was 76 in 6 months ago it was 21.    Review of Systems see above     Objective:   Physical Exam Skin warm and dry. Nodes none. Neck is supple without JVD thyromegaly or carotid bruits. Chest clear to auscultation. Cardiac exam regular rate and rhythm normal S1 and S2. Extremities without edema.       Assessment & Plan:  Chronic kidney disease stage III  Iron deficiency anemia-resolved  Rheumatoid arthritis  Plan: Patient is continuing iron supplementation twice daily as prescribed by Dr. Watt Climes. She will need to have physical examination here in the next 6 months. This was all explained to her in detail.

## 2015-12-27 DIAGNOSIS — M1990 Unspecified osteoarthritis, unspecified site: Secondary | ICD-10-CM | POA: Diagnosis not present

## 2015-12-27 DIAGNOSIS — M25511 Pain in right shoulder: Secondary | ICD-10-CM | POA: Diagnosis not present

## 2015-12-27 DIAGNOSIS — M25571 Pain in right ankle and joints of right foot: Secondary | ICD-10-CM | POA: Diagnosis not present

## 2015-12-27 DIAGNOSIS — M059 Rheumatoid arthritis with rheumatoid factor, unspecified: Secondary | ICD-10-CM | POA: Diagnosis not present

## 2015-12-27 DIAGNOSIS — Z780 Asymptomatic menopausal state: Secondary | ICD-10-CM | POA: Diagnosis not present

## 2016-01-14 ENCOUNTER — Encounter: Payer: Self-pay | Admitting: Internal Medicine

## 2016-01-14 ENCOUNTER — Ambulatory Visit (INDEPENDENT_AMBULATORY_CARE_PROVIDER_SITE_OTHER): Payer: Self-pay | Admitting: Internal Medicine

## 2016-01-14 ENCOUNTER — Other Ambulatory Visit: Payer: Self-pay

## 2016-01-14 VITALS — BP 126/70 | HR 64 | Temp 97.9°F | Ht 61.0 in | Wt 159.0 lb

## 2016-01-14 DIAGNOSIS — R202 Paresthesia of skin: Secondary | ICD-10-CM

## 2016-01-14 DIAGNOSIS — I10 Essential (primary) hypertension: Secondary | ICD-10-CM

## 2016-01-14 DIAGNOSIS — K056 Periodontal disease, unspecified: Secondary | ICD-10-CM

## 2016-01-14 DIAGNOSIS — D649 Anemia, unspecified: Secondary | ICD-10-CM

## 2016-01-14 DIAGNOSIS — Z862 Personal history of diseases of the blood and blood-forming organs and certain disorders involving the immune mechanism: Secondary | ICD-10-CM

## 2016-01-14 DIAGNOSIS — J340 Abscess, furuncle and carbuncle of nose: Secondary | ICD-10-CM

## 2016-01-14 DIAGNOSIS — K090 Developmental odontogenic cysts: Secondary | ICD-10-CM

## 2016-01-14 DIAGNOSIS — M05711 Rheumatoid arthritis with rheumatoid factor of right shoulder without organ or systems involvement: Secondary | ICD-10-CM

## 2016-01-14 DIAGNOSIS — E118 Type 2 diabetes mellitus with unspecified complications: Secondary | ICD-10-CM

## 2016-01-14 DIAGNOSIS — N183 Chronic kidney disease, stage 3 unspecified: Secondary | ICD-10-CM

## 2016-01-14 MED ORDER — CEPHALEXIN 500 MG PO CAPS: 500.0000 mg | ORAL_CAPSULE | Freq: Four times a day (QID) | ORAL | 0 refills | Status: DC

## 2016-01-14 NOTE — Patient Instructions (Signed)
Keflex 500 mg 4 times daily for 10 days. Bactroban in nostrils at bedtime. Appointment with neurologist to evaluate paresthesias left arm

## 2016-01-14 NOTE — Progress Notes (Signed)
   Subjective:    Patient ID: [name], female    DOB: Jul 12, 1940, 75 y.o.   MRN: [id number]  HPI 75 year old Black Female who had left shoulder arthroplasty surgery by Dr. Onnie Graham in Sept 2015. Has had persistent left arm  "shocks down arm" and paresthesias since surgery. Has numbness of left thumb, index and third finger.She describes what sounds like EMG studies done by Dr. Onnie Graham but I do not have those results. She has Rheumatoid Arthritis as well. She is followed by Rheumatologist. Intolerant of methotrexate.  What she is here for today is a cyst maxillary gingival area. Hx staph infection left nostril. Tends to get better with Bactroban ointment in nostril and Keflex.   Hx of iron deficiency and anemia of chronic disease.Recently saw Dr. Watt Climes, GI ,and Dr. Jana Hakim, Hematologist.Had laparoscopic vaginal hysterectomy with A and P repair August 2016.   She has some chronic kidney disease with elevated serum creatinine but that is stable. History of impaired glucose tolerance.     Review of Systems no fever of chills. No dentak pain     Objective:   Physical Exam Cystic lesion above left central incisor. No pus draining from this lesion.       Assessment & Plan:  Cystic gingival lesion  History of staph infection in nostril  Rheumatoid arthritis  ? Cervical radiculopathy with history of rheumatoid arthritis  Plan: Refer to Neurologist for further evaluation of paresthesias and numbness of fingers. She says it's bothering her quite a bit nail and she is worried about it.  Will prescribe Keflex 500 mg 4 times daily for 10 days and Bactroban to use in her nostrils at bedtime

## 2016-02-07 ENCOUNTER — Ambulatory Visit (INDEPENDENT_AMBULATORY_CARE_PROVIDER_SITE_OTHER): Payer: Medicare Other | Admitting: Neurology

## 2016-02-07 ENCOUNTER — Encounter: Payer: Self-pay | Admitting: Neurology

## 2016-02-07 VITALS — BP 120/71 | HR 67 | Ht 60.0 in | Wt 165.4 lb

## 2016-02-07 DIAGNOSIS — G5612 Other lesions of median nerve, left upper limb: Secondary | ICD-10-CM | POA: Diagnosis not present

## 2016-02-07 NOTE — Patient Instructions (Signed)
Remember to drink plenty of fluid, eat healthy meals and do not skip any meals. Try to eat protein with a every meal and eat a healthy snack such as fruit or nuts in between meals. Try to keep a regular sleep-wake schedule and try to exercise daily, particularly in the form of walking, 20-30 minutes a day, if you can.   I would like to see you back for emg/ncs, sooner if we need to. Please call us with any interim questions, concerns, problems, updates or refill requests.   Our phone number is [phone]. We also have an after hours call service for urgent matters and there is a physician on-call for urgent questions. For any emergencies you know to call 911 or go to the nearest emergency room

## 2016-02-07 NOTE — Progress Notes (Signed)
[facility]    Provider:  Dr Jaynee Eagles Referring Provider: [name], MD Primary Care Physician:  [name], MD  CC:  Left arm pain  HPI:  [name] is a 75 y.o. female here as a referral from Dr. Renold Genta for left arm pain. PMHx RA, HTN, cervical stenosis.  She had shoulder surgery several years ago and since then she has numbness and tingling of digits 3+1/2 of the left hand. She thinks it is from the nerve block given before surgery. She had a rotator cuff tear on the left. She sometimes has a sharp pain throught the left shoulder. She denies neck pain. Symptoms in the hand are stable, not worsening. It throbs all the time, gets on her nerves, difficulty picking things more fine motor difficulty, sensory changes in the left hand as well, nothing seems to make it better, she drops things from her left hand. Can be worse at night and can wake her up with pain, she gets cramps in the left hand. Ibuprofen doesn;t help. She takes medication for RA but that doesn't help her left hand wither. No weaknes sin the arm. No neck pain or radicular symptoms bt she does have some shooting pain from the shoulder down the back of the left arm (point to the triceps). She has not had an emg/ncs in at least 2 years. He did have an emg over 2 years ago. No other associated symptoms or modifying factors. Denies any other focal neurologic deficits or symptoms. No FHx of neuropathy. Right arm/hand unaffected.   Reviewed notes, labs and imaging from outside physicians, which showed:  XR cervical spine 2000: CERVICAL SPINE: THERE IS NO EVIDENCE OF PREVERTEBRAL SOFT TISSUE SWELLING, ACUTE FRACTURES OR DISLOCATIONS. 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  CBC with anemia 12/20/2015 11/33.9 BMP 06/07/2015 with creatinine 1.32   Review of Systems: Patient complains of symptoms per HPI as well as the following  symptoms: no CP, no SOB. Pertinent negatives per HPI. All others negative.   Social History   Social History  . Marital status: Widowed    Spouse name: N/A  . Number of children: 58  . Years of education: 7   Occupational History  . Not on file.   Social History Main Topics  . Smoking status: Never Smoker  . Smokeless tobacco: Never Used  . Alcohol use No  . Drug use: No  . Sexual activity: No   Other Topics Concern  . Not on file   Social History Narrative   Lives with daughter   Caffeine use: none    Family History  Problem Relation Age of Onset  . Healthy Daughter     Past Medical History:  Diagnosis Date  . Arthritis   . Cervical stenosis of spine   . Dysphasia   . Essential hypertension, benign   . GERD (gastroesophageal reflux disease)   . Glaucoma   . Heart murmur   . History of estrogen therapy   . Hypercholesteremia   . IBS (irritable bowel syndrome)   . Mitral regurgitation   . Pulmonary air embolism ([place])    states she had this wiht her back surgery  . Rheumatoid arthritis ([place])   . Seborrhea    ear    Past Surgical History:  Procedure Laterality Date  . ANTERIOR AND POSTERIOR REPAIR WITH SACROSPINOUS FIXATION N/A 01/08/2015   Procedure: ANTERIOR AND POSTERIOR  REPAIR WITH possible SACROSPINOUS FIXATION;  Surgeon: Molli Posey, MD;  Location: [facility];  Service: Gynecology;  Laterality: N/A;  . CATARACT EXTRACTION Bilateral   . COLONOSCOPY    . EYE SURGERY    . LAPAROSCOPIC ASSISTED VAGINAL HYSTERECTOMY N/A 01/08/2015   Procedure: LAPAROSCOPIC ASSISTED VAGINAL HYSTERECTOMY;  Surgeon: Molli Posey, MD;  Location: [facility];  Service: Gynecology;  Laterality: N/A;  . LUMBAR FUSION  05/2010  . REVERSE SHOULDER ARTHROPLASTY Left 02/16/2014   Procedure: LEFT REVERSE TOTAL SHOULDER ARTHROPLASTY;  Surgeon: Marin Shutter, MD;  Location: [facility];  Service: Orthopedics;  Laterality: Left;  . TOE SURGERY    . TUBAL LIGATION      Current Outpatient  Prescriptions  Medication Sig Dispense Refill  . acetaminophen (TYLENOL) 650 MG CR tablet Take 650 mg by mouth daily as needed for pain.     Marland Kitchen alendronate (FOSAMAX) 70 MG tablet Take 1 tablet by mouth once a week.    Marland Kitchen aspirin 81 MG tablet Take 81 mg by mouth daily.     . Calcium Carbonate-Vit D-Min (CALCIUM 1200 PO) Take 1 capsule by mouth daily.    . cholecalciferol (VITAMIN D) 1000 units tablet Take 1,000 Units by mouth daily.    Marland Kitchen ibuprofen (ADVIL,MOTRIN) 600 MG tablet Take 1 tablet (600 mg total) by mouth every 6 (six) hours as needed. 30 tablet 0  . Iron-FA-B Cmp-C-Biot-Probiotic (FUSION PLUS) CAPS TK 1 C PO QD  3  . lisinopril (PRINIVIL,ZESTRIL) 5 MG tablet TAKE 1 TABLET BY MOUTH DAILY 90 tablet 3  . mupirocin ointment (BACTROBAN) 2 % APPLY TO THE AFFECTED AREA TWICE DAILY AS NEEDED 22 g 0  . omeprazole (PRILOSEC) 20 MG capsule TAKE 1 CAPSULE BY MOUTH DAILY 30 capsule 11  . polyethylene glycol powder (GLYCOLAX/MIRALAX) powder MIX 1 CAPFUL IN 8 OUNCES OF WATER AND DRINK ONCE DAILY AS DIRECTED 527 g PRN  . simvastatin (ZOCOR) 20 MG tablet TAKE 1 TABLET BY MOUTH EVERY NIGHT AT BEDTIME 90 tablet 3  . terconazole (TERAZOL 3) 0.8 % vaginal cream Place 1 applicator vaginally at bedtime. 20 g 11  . triamcinolone cream (KENALOG) 0.1 % APPLY EXTERNALLY TO THE AFFECTED AREA THREE TIMES DAILY 45 g 2  . triamterene-hydrochlorothiazide (MAXZIDE) 75-50 MG tablet TAKE 1 TABLET BY MOUTH EVERY DAY 90 tablet 3   No current facility-administered medications for this visit.     Allergies as of 02/07/2016 - Review Complete 02/07/2016  Allergen Reaction Noted  . Amoxicillin-pot clavulanate Diarrhea and Itching 05/26/2011  . Latex Itching 02/08/2014  . Tramadol Nausea And Vomiting 05/14/2015    Vitals: BP 120/71 (BP Location: Left Arm, Patient Position: Sitting, Cuff Size: Normal)   Pulse 67   Ht 5' (1.524 m)   Wt 165 lb 6.4 oz (75 kg)   BMI 32.30 kg/m  Last Weight:  Wt Readings from Last 1  Encounters:  02/07/16 165 lb 6.4 oz (75 kg)   Last Height:   Ht Readings from Last 1 Encounters:  02/07/16 5' (1.524 m)   Physical exam: Exam: Gen: NAD, conversant, well nourised, obese, well groomed                     CV: RRR, no MRG. No Carotid Bruits. No peripheral edema, warm, nontender Eyes: Conjunctivae clear without exudates or hemorrhage  Neuro: Detailed Neurologic Exam  Speech:    Speech is normal; fluent and spontaneous with normal comprehension.  Cognition:    The patient is oriented  to person, place, and time;     recent and remote memory intact;     language fluent;     normal attention, concentration,     fund of knowledge Cranial Nerves:    The pupils are equal, round, and reactive to light. The fundi are normal and spontaneous venous pulsations are present. Visual fields are full to finger confrontation. Extraocular movements are intact. Trigeminal sensation is intact and the muscles of mastication are normal. The face is symmetric. The palate elevates in the midline. Hearing intact. Voice is normal. Shoulder shrug is normal. The tongue has normal motion without fasciculations.   Coordination:    Normal finger to nose and heel to shin. Normal rapid alternating movements.   Gait:    Heel-toe and tandem gait are normal.   Motor Observation: Flattening of the left thenar eminence. Otherwise no asymmetry, no atrophy, and no involuntary movements noted. Tone:    Normal muscle tone.    Posture:    Posture is normal. normal erect    Strength:    Strength is symmetric and intact in the upper and lower limbs no asymmetry of intrinsic hand muscle noted      Sensation: Hyperestesias in the left 1-3.5 digits     Reflex Exam:  DTR's: Hypo lowers trace uppers   Toes:    The toes are equivocalbilaterally.   Clonus:    Clonus is absent.  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 Please include radial sensory as well as comparison studies on the left (palmar comparison) and emg of the left arm focusing on median muscle to try and localize if at wrist (ie carpal Tunnel) or median neuropathy at other site due to nerve block as patient states.  Cc: Dr. Orland Dec, [facility] [address] [place], [place] [address]  Phone [phone] Fax [phone]

## 2016-02-08 DIAGNOSIS — M059 Rheumatoid arthritis with rheumatoid factor, unspecified: Secondary | ICD-10-CM | POA: Diagnosis not present

## 2016-02-08 DIAGNOSIS — M25511 Pain in right shoulder: Secondary | ICD-10-CM | POA: Diagnosis not present

## 2016-02-08 DIAGNOSIS — M1990 Unspecified osteoarthritis, unspecified site: Secondary | ICD-10-CM | POA: Diagnosis not present

## 2016-02-08 DIAGNOSIS — M25571 Pain in right ankle and joints of right foot: Secondary | ICD-10-CM | POA: Diagnosis not present

## 2016-02-13 ENCOUNTER — Other Ambulatory Visit: Payer: Self-pay | Admitting: Internal Medicine

## 2016-03-10 ENCOUNTER — Ambulatory Visit (INDEPENDENT_AMBULATORY_CARE_PROVIDER_SITE_OTHER): Payer: Medicare Other | Admitting: Internal Medicine

## 2016-03-10 DIAGNOSIS — Z23 Encounter for immunization: Secondary | ICD-10-CM | POA: Diagnosis not present

## 2016-03-27 ENCOUNTER — Ambulatory Visit (INDEPENDENT_AMBULATORY_CARE_PROVIDER_SITE_OTHER): Payer: Medicare Other | Admitting: Neurology

## 2016-03-27 ENCOUNTER — Ambulatory Visit (INDEPENDENT_AMBULATORY_CARE_PROVIDER_SITE_OTHER): Payer: Self-pay | Admitting: Neurology

## 2016-03-27 DIAGNOSIS — G5612 Other lesions of median nerve, left upper limb: Secondary | ICD-10-CM

## 2016-03-27 DIAGNOSIS — Z0289 Encounter for other administrative examinations: Secondary | ICD-10-CM

## 2016-03-27 DIAGNOSIS — G5603 Carpal tunnel syndrome, bilateral upper limbs: Secondary | ICD-10-CM

## 2016-03-27 NOTE — Progress Notes (Signed)
See procedure note.

## 2016-03-27 NOTE — Progress Notes (Signed)
[facility]    Provider:  Dr Jaynee Eagles Referring Provider: [name], MD Primary Care Physician:  [name], MD  History: Patient with sensory changes left digits 1-3.5, flattening of the left thenar eminence, +Tinel's sign and Phalen's maneuver at the left wrist, Hyperesthesias in the left 1-3.5 digits    Summary:   Nerve Conduction Studies were performed on the bilateral upper extremities.  The right median APB motor nerve was within normal limits The right orthodromic Median 2nd Digit sensory nerve showed prolonged distal peak latency (3.85 ms, N<3.4). F Wave studies indicate that the right Median F wave has normal latency.  The left median APB motor nerve was within normal limits The left Median 2nd Digit sensory nerve showed no response F Wave studies indicate that the left Median F wave has normal latency.  Bilateral Ulnar ADM motor nerves were within normal limits. The bilateralUlnar 5th digit sensory nerves were within normal limits. F Wave studies indicate that the bilateral Ulnar F waves have normal latencies  The right median/ulnar (palm) comparison nerve showed prolonged distal peak latency (Median Palm, 2.97 ms, N<2.2) and abnormal peak latency difference (Median Palm-Ulnar Palm, 0.63ms, N<0.4) with a relative median delay.   The left median/ulnar (palm) comparison nerve showed prolonged distal peak latency (Median Palm, 2.8 ms, N<2.2) and abnormal peak latency difference (Median Palm-Ulnar Palm, 0.60ms, N<0.4) with a relative median delay. .   EMG needle study of selected left upper extremity muscles was performed. The following muscles were normal: Deltoid, Triceps, Pronator Teres, Opponens Pollicis, First Dorsal Interosseous and cervical lower paraspinals.  Conclusion: This is an abnormal study. 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 No suggestion of polyneuropathy or radiculopathy.          [place]re[place]    Nerve / Sites Rec. Site Peak Lat Ref. Amp.1-2 Ref. Distance    ms ms V V cm  L Median, Ulnar - Transcarpal comparison     Median Palm Wrist 2.92 ?2.20 9.0 ?50.0 8     Ulnar Palm Wrist 1.98 ?2.20 16.2 ?12.0 8          R Median, Ulnar - Transcarpal comparison     Median Palm Wrist 2.97 ?2.20 34.3 ?50.0 8     Ulnar Palm Wrist 2.14 ?2.20 20.9 ?12.0 8          L Median - Orthodromic (Dig II, Mid palm)     Dig II Wrist NR ?3.40 NR ?10.0 13  R Median - Orthodromic (Dig II, Mid palm)     Dig II Wrist 3.85 ?3.40 11.9 ?10.0 13  L Ulnar - Orthodromic, (Dig V, Mid palm)     Dig V Wrist 2.81 ?3.10 9.4 ?5.0 11  R Ulnar - Orthodromic, (Dig V, Mid palm)     Dig V Wrist 2.76 ?3.10 13.0 ?5.0 11     MNC    Nerve / Sites Muscle Latency Ref. Amplitude Ref. Rel Amp Segments Distance Lat Diff Velocity Ref. Area    ms ms mV mV %  cm ms m/s m/s mVms  L Median - APB     Wrist APB 4.0 ?4.4 4.2 ?4.0 100 Wrist - APB 7    12.6     Upper arm APB 8.0  5.2  123 Upper arm - Wrist 23 4.1 57  16.4     Axilla APB 11.9  5.1  97 Axilla - Upper arm 22 3.9 56  14.6  Supraclav fossa APB 14.3  5.8  114 Axilla - Wrist  8.0   14.7         Supraclav fossa - Axilla 15 2.3 64           Supraclav fossa - Wrist  10.3     R Median - APB     Wrist APB 4.0 ?4.4 7.9 ?4.0 100 Wrist - APB 7    23.5     Upper arm APB 8.2  5.8  73.4 Upper arm - Wrist 23 4.3 54  19.0     Axilla APB 11.3  6.2  108 Axilla - Upper arm 17 3.0 56  22.0     Supraclav fossa APB 13.6  5.4  85.9 Axilla - Wrist  7.3   18.6         Supraclav fossa - Axilla 14 2.4 58           Supraclav fossa - Wrist  9.7     L Ulnar - ADM     Wrist ADM 2.8 ?3.3 8.1 ?6.0 100 Wrist - ADM 7    25.7     B.Elbow ADM 6.3  7.9  97.5 B.Elbow - Wrist 19 3.5 54 ?49 28.7     A.Elbow ADM 8.1  7.6  96.3 A.Elbow - B.Elbow 10 1.9 53 ?49 28.1         A.Elbow - Wrist  5.4     R Ulnar - ADM     Wrist ADM 2.8 ?3.3 6.7 ?6.0 100 Wrist - ADM 7    24.7     B.Elbow ADM  6.3  7.0  104 B.Elbow - Wrist 19 3.4 55 ?49 25.4     A.Elbow ADM 8.2  6.3  90.7 A.Elbow - B.Elbow 10 2.0 51 ?49 24.9         A.Elbow - Wrist  5.4        F  Wave    Nerve F Lat Ref.   ms ms  L Median - APB 28.1 ?31.0  L Ulnar - ADM 28.8 ?32.0  R Median - APB 28.6 ?31.0  R Ulnar - ADM 28.3 ?32.0     EMG full                                       Sarina Ill, MD  [facility] [address] [place], [place] [address]  Phone [phone] Fax [phone]

## 2016-04-01 NOTE — Procedures (Signed)
[facility]    Provider:  Dr Jaynee Eagles Referring Provider: [name], MD Primary Care Physician:  [name], MD  History:Patient with sensory changes left digits 1-3.5, flattening of the left thenar eminence, +Tinel's sign and Phalen's maneuver at the left wrist, Hyperesthesias in the left 1-3.5 digits    Summary:   Nerve Conduction Studies were performed on the bilateral upper extremities.  The right median APB motor nerve was within normal limits The right orthodromic Median 2nd Digit sensory nerve showed prolonged distal peak latency (3.85 ms, N<3.4). F Wave studies indicate that the right Median F wave has normal latency.  The left median APB motor nerve was within normal limits The left Median 2nd Digit sensory nerve showed no response F Wave studies indicate that the left Median F wave has normal latency.  Bilateral Ulnar ADM motor nerves were within normal limits. The bilateralUlnar 5th digit sensory nerves were within normal limits. F Wave studies indicate that the bilateral Ulnar F waves have normal latencies  The right median/ulnar (palm) comparison nerve showed prolonged distal peak latency (Median Palm, 2.97 ms, N<2.2) and abnormal peak latency difference (Median Palm-Ulnar Palm, 0.78ms, N<0.4) with a relative median delay.   The left median/ulnar (palm) comparison nerve showed prolonged distal peak latency (Median Palm, 2.8 ms, N<2.2) and abnormal peak latency difference (Median Palm-Ulnar Palm, 0.17ms, N<0.4) with a relative median delay. .   EMG needle study of selected left upper extremity muscles was performed. The following muscles were normal: Deltoid, Triceps, Pronator Teres, Opponens Pollicis, First Dorsal Interosseous and cervical lower paraspinals.  Conclusion: This is an abnormal study. 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 No suggestion of polyneuropathy or radiculopathy.           [place]    Nerve / Sites Rec. Site Peak Lat Ref. Amp.1-2 Ref. Distance    ms ms V V cm  L Median, Ulnar - Transcarpal comparison     Median Palm Wrist 2.92 ?2.20 9.0 ?50.0 8     Ulnar Palm Wrist 1.98 ?2.20 16.2 ?12.0 8          R Median, Ulnar - Transcarpal comparison     Median Palm Wrist 2.97 ?2.20 34.3 ?50.0 8     Ulnar Palm Wrist 2.14 ?2.20 20.9 ?12.0 8          L Median - Orthodromic (Dig II, Mid palm)     Dig II Wrist NR ?3.40 NR ?10.0 13  R Median - Orthodromic (Dig II, Mid palm)     Dig II Wrist 3.85 ?3.40 11.9 ?10.0 13  L Ulnar - Orthodromic, (Dig V, Mid palm)     Dig V Wrist 2.81 ?3.10 9.4 ?5.0 11  R Ulnar - Orthodromic, (Dig V, Mid palm)     Dig V Wrist 2.76 ?3.10 13.0 ?5.0 11     MNC    Nerve / Sites Muscle Latency Ref. Amplitude Ref. Rel Amp Segments Distance Lat Diff Velocity Ref. Area    ms ms mV mV %  cm ms m/s m/s mVms  L Median - APB     Wrist APB 4.0 ?4.4 4.2 ?4.0 100 Wrist - APB 7    12.6     Upper arm APB 8.0  5.2  123 Upper arm - Wrist 23 4.1 57  16.4     Axilla APB 11.9  5.1  97 Axilla - Upper arm 22 3.9 56  14.6  Supraclav fossa APB 14.3  5.8  114 Axilla - Wrist  8.0   14.7         Supraclav fossa - Axilla 15 2.3 64           Supraclav fossa - Wrist  10.3     R Median - APB     Wrist APB 4.0 ?4.4 7.9 ?4.0 100 Wrist - APB 7    23.5     Upper arm APB 8.2  5.8  73.4 Upper arm - Wrist 23 4.3 54  19.0     Axilla APB 11.3  6.2  108 Axilla - Upper arm 17 3.0 56  22.0     Supraclav fossa APB 13.6  5.4  85.9 Axilla - Wrist  7.3   18.6         Supraclav fossa - Axilla 14 2.4 58           Supraclav fossa - Wrist  9.7     L Ulnar - ADM     Wrist ADM 2.8 ?3.3 8.1 ?6.0 100 Wrist - ADM 7    25.7     B.Elbow ADM 6.3  7.9  97.5 B.Elbow - Wrist 19 3.5 54 ?49 28.7     A.Elbow ADM 8.1  7.6  96.3 A.Elbow - B.Elbow 10 1.9 53 ?49 28.1         A.Elbow -  Wrist  5.4     R Ulnar - ADM     Wrist ADM 2.8 ?3.3 6.7 ?6.0 100 Wrist - ADM 7    24.7     B.Elbow ADM 6.3  7.0  104 B.Elbow - Wrist 19 3.4 55 ?49 25.4     A.Elbow ADM 8.2  6.3  90.7 A.Elbow - B.Elbow 10 2.0 51 ?49 24.9         A.Elbow - Wrist  5.4        F  Wave    Nerve F Lat Ref.   ms ms  L Median - APB 28.1 ?31.0  L Ulnar - ADM 28.8 ?32.0  R Median - APB 28.6 ?31.0  R Ulnar - ADM 28.3 ?32.0     EMG full                                       Sarina Ill, MD  [facility] [address] [place], [place] [address]  Phone [phone] Fax [phone]

## 2016-04-03 ENCOUNTER — Other Ambulatory Visit: Payer: Self-pay | Admitting: Internal Medicine

## 2016-04-27 ENCOUNTER — Other Ambulatory Visit: Payer: Self-pay | Admitting: Internal Medicine

## 2016-05-09 DIAGNOSIS — M059 Rheumatoid arthritis with rheumatoid factor, unspecified: Secondary | ICD-10-CM | POA: Diagnosis not present

## 2016-05-09 DIAGNOSIS — E790 Hyperuricemia without signs of inflammatory arthritis and tophaceous disease: Secondary | ICD-10-CM | POA: Diagnosis not present

## 2016-05-09 DIAGNOSIS — M1990 Unspecified osteoarthritis, unspecified site: Secondary | ICD-10-CM | POA: Diagnosis not present

## 2016-05-09 DIAGNOSIS — M25571 Pain in right ankle and joints of right foot: Secondary | ICD-10-CM | POA: Diagnosis not present

## 2016-05-09 DIAGNOSIS — M25511 Pain in right shoulder: Secondary | ICD-10-CM | POA: Diagnosis not present

## 2016-05-10 ENCOUNTER — Encounter (HOSPITAL_COMMUNITY): Payer: Self-pay | Admitting: *Deleted

## 2016-05-10 ENCOUNTER — Emergency Department (HOSPITAL_COMMUNITY)
Admission: EM | Admit: 2016-05-10 | Discharge: 2016-05-10 | Disposition: A | Discharge status: Home / Self Care | Payer: Medicare Other | Attending: Emergency Medicine | Admitting: Emergency Medicine

## 2016-05-10 DIAGNOSIS — Z7982 Long term (current) use of aspirin: Secondary | ICD-10-CM | POA: Diagnosis not present

## 2016-05-10 DIAGNOSIS — Z96612 Presence of left artificial shoulder joint: Secondary | ICD-10-CM | POA: Insufficient documentation

## 2016-05-10 DIAGNOSIS — N3000 Acute cystitis without hematuria: Secondary | ICD-10-CM

## 2016-05-10 DIAGNOSIS — Z79899 Other long term (current) drug therapy: Secondary | ICD-10-CM | POA: Diagnosis not present

## 2016-05-10 DIAGNOSIS — N3001 Acute cystitis with hematuria: Secondary | ICD-10-CM | POA: Diagnosis not present

## 2016-05-10 DIAGNOSIS — N183 Chronic kidney disease, stage 3 (moderate): Secondary | ICD-10-CM | POA: Insufficient documentation

## 2016-05-10 DIAGNOSIS — E1122 Type 2 diabetes mellitus with diabetic chronic kidney disease: Secondary | ICD-10-CM | POA: Insufficient documentation

## 2016-05-10 DIAGNOSIS — I129 Hypertensive chronic kidney disease with stage 1 through stage 4 chronic kidney disease, or unspecified chronic kidney disease: Secondary | ICD-10-CM | POA: Insufficient documentation

## 2016-05-10 DIAGNOSIS — M545 Low back pain: Secondary | ICD-10-CM | POA: Diagnosis not present

## 2016-05-10 DIAGNOSIS — Z91040 Latex allergy status: Secondary | ICD-10-CM | POA: Insufficient documentation

## 2016-05-10 LAB — URINALYSIS, ROUTINE W REFLEX MICROSCOPIC
Bilirubin Urine: NEGATIVE
Glucose, UA: NEGATIVE mg/dL
Hgb urine dipstick: NEGATIVE
Ketones, ur: NEGATIVE mg/dL
Nitrite: NEGATIVE
Protein, ur: NEGATIVE mg/dL
Specific Gravity, Urine: 1.01 (ref 1.005–1.030)
pH: 7 (ref 5.0–8.0)

## 2016-05-10 MED ORDER — CEPHALEXIN 500 MG PO CAPS: 500.0000 mg | ORAL_CAPSULE | Freq: Four times a day (QID) | ORAL | 0 refills | Status: DC

## 2016-05-10 MED ORDER — ONDANSETRON 4 MG PO TBDP: 4.0000 mg | ORAL_TABLET | Freq: Three times a day (TID) | ORAL | 0 refills | Status: DC | PRN

## 2016-05-10 MED ORDER — HYDROCODONE-ACETAMINOPHEN 5-325 MG PO TABS: 1.0000 | ORAL_TABLET | Freq: Four times a day (QID) | ORAL | 0 refills | Status: DC | PRN

## 2016-05-10 MED ORDER — HYDROCODONE-ACETAMINOPHEN 5-325 MG PO TABS
1.0000 | ORAL_TABLET | Freq: Once | ORAL | Status: AC
  Administered 2016-05-10: 1 via ORAL
  Filled 2016-05-10: qty 1

## 2016-05-10 NOTE — ED Provider Notes (Signed)
[facility] DEPT Provider Note   CSN: [id number] Arrival date & time: 05/10/16  [id number]     History   Chief Complaint Chief Complaint  Patient presents with  . Back Pain    HPI [name] is a 75 y.o. female.  The history is provided by the patient and medical records. No language interpreter was used.  Back Pain   Pertinent negatives include no fever, no numbness, no abdominal pain, no dysuria and no weakness.   [name] is a 75 y.o. female  with a PMH of RA, HTN, DM, HLD who presents to the Emergency Department complaining of bilateral low back pain left > right which began on Thursday and has been gradually worsening. She called her orthopedist and scheduled an appointment for Wednesday (4 days away) but states the pain is too bad to wait until then to be seen. She has tried tylenol with no relief. Pain is worse with walking, bending and twisting. No other medications taken prior to arrival for symptoms. Denies upper back or neck pain, Denies fever, saddle anesthesia, weakness, numbness, tingling, urinary complaints including retention/incontinence/dysuria. She had surgery on low back approximately 5 years ago for "stenosis" but has had no recent spinal procedures and had no complications from surgery.    Past Medical History:  Diagnosis Date  . Arthritis   . Cervical stenosis of spine   . Dysphasia   . Essential hypertension, benign   . GERD (gastroesophageal reflux disease)   . Glaucoma   . Heart murmur   . History of estrogen therapy   . Hypercholesteremia   . IBS (irritable bowel syndrome)   . Mitral regurgitation   . Pulmonary air embolism ([place])    states she had this wiht her back surgery  . Rheumatoid arthritis ([place])   . Seborrhea    ear    Patient Active Problem List   Diagnosis Date Noted  . CKD (chronic kidney disease) stage 3, GFR 30-59 ml/min 10/01/2015  . Anemia 05/10/2015  . Uterine prolapse 01/08/2015  . Glaucoma 09/19/2014  . S/P  shoulder replacement 02/16/2014  . Mouth ulcers 05/24/2013  . Rheumatoid arthritis ([place]) 09/23/2012  . Diabetes mellitus 01/13/2011  . Hyperlipidemia 01/13/2011  . Hypertension 01/13/2011  . GE reflux 01/13/2011  . Glaucoma 01/13/2011  . Irritable bowel syndrome 01/13/2011  . Hx pulmonary embolism 01/13/2011    Past Surgical History:  Procedure Laterality Date  . ANTERIOR AND POSTERIOR REPAIR WITH SACROSPINOUS FIXATION N/A 01/08/2015   Procedure: ANTERIOR AND POSTERIOR REPAIR WITH possible SACROSPINOUS FIXATION;  Surgeon: Molli Posey, MD;  Location: [facility];  Service: Gynecology;  Laterality: N/A;  . CATARACT EXTRACTION Bilateral   . COLONOSCOPY    . EYE SURGERY    . LAPAROSCOPIC ASSISTED VAGINAL HYSTERECTOMY N/A 01/08/2015   Procedure: LAPAROSCOPIC ASSISTED VAGINAL HYSTERECTOMY;  Surgeon: Molli Posey, MD;  Location: [facility];  Service: Gynecology;  Laterality: N/A;  . LUMBAR FUSION  05/2010  . REVERSE SHOULDER ARTHROPLASTY Left 02/16/2014   Procedure: LEFT REVERSE TOTAL SHOULDER ARTHROPLASTY;  Surgeon: Marin Shutter, MD;  Location: [facility];  Service: Orthopedics;  Laterality: Left;  . TOE SURGERY    . TUBAL LIGATION      OB History    No data available       Home Medications    Prior to Admission medications   Medication Sig Start Date End Date Taking? Authorizing Provider  acetaminophen (TYLENOL) 650 MG CR tablet Take 650 mg by mouth daily as  needed for pain.    Yes Historical Provider, MD  alendronate (FOSAMAX) 70 MG tablet Take 1 tablet by mouth once a week. 12/31/15  Yes Historical Provider, MD  aspirin 81 MG tablet Take 81 mg by mouth daily.    Yes Historical Provider, MD  Calcium Carbonate-Vit D-Min (CALCIUM 1200 PO) Take 1 capsule by mouth daily.   Yes Historical Provider, MD  cholecalciferol (VITAMIN D) 1000 units tablet Take 1,000 Units by mouth daily.   Yes Historical Provider, MD  Iron-FA-B Cmp-C-Biot-Probiotic (FUSION PLUS) CAPS TK 1 C PO QD 09/26/15  Yes  Historical Provider, MD  lisinopril (PRINIVIL,ZESTRIL) 5 MG tablet TAKE 1 TABLET BY MOUTH DAILY 09/13/15  Yes [name], MD  omeprazole (PRILOSEC) 20 MG capsule TAKE 1 CAPSULE BY MOUTH DAILY 08/06/15  Yes [name], MD  polyethylene glycol powder (GLYCOLAX/MIRALAX) powder MIX 1 CAPFUL IN 8 OUNCES OF WATER AND DRINK ONCE DAILY AS DIRECTED 04/27/16  Yes [name], MD  simvastatin (ZOCOR) 20 MG tablet TAKE 1 TABLET BY MOUTH EVERY NIGHT AT BEDTIME 08/17/15  Yes [name], MD  triamcinolone cream (KENALOG) 0.1 % APPLY EXTERNALLY TO THE AFFECTED AREA THREE TIMES DAILY 06/17/15  Yes [name], MD  triamterene-hydrochlorothiazide (MAXZIDE) 75-50 MG tablet TAKE 1 TABLET BY MOUTH EVERY DAY 04/03/16  Yes [name], MD  cephALEXin (KEFLEX) 500 MG capsule Take 1 capsule (500 mg total) by mouth 4 (four) times daily. 05/10/16   Ozella Almond Liala Codispoti, PA-C  HYDROcodone-acetaminophen (NORCO/VICODIN) 5-325 MG tablet Take 1 tablet by mouth every 6 (six) hours as needed for severe pain. 05/10/16   Ozella Almond Makennah Omura, PA-C  ibuprofen (ADVIL,MOTRIN) 600 MG tablet Take 1 tablet (600 mg total) by mouth every 6 (six) hours as needed. Patient not taking: Reported on 05/10/2016 01/09/15   Molli Posey, MD  mupirocin ointment (BACTROBAN) 2 % APPLY TO THE AFFECTED AREA TWICE DAILY AS NEEDED Patient not taking: Reported on 05/10/2016 02/13/16   [name], MD  ondansetron (ZOFRAN ODT) 4 MG disintegrating tablet Take 1 tablet (4 mg total) by mouth every 8 (eight) hours as needed for nausea or vomiting. 05/10/16   Ozella Almond Breona Cherubin, PA-C  terconazole (TERAZOL 3) 0.8 % vaginal cream Place 1 applicator vaginally at bedtime. Patient not taking: Reported on 05/10/2016 09/03/15   [name], MD    Family History Family History  Problem Relation Age of Onset  . Healthy Daughter     Social History Social History  Substance Use Topics  . Smoking status: Never Smoker  . Smokeless tobacco: Never Used  .  Alcohol use No     Allergies   Amoxicillin-pot clavulanate; Latex; and Tramadol   Review of Systems Review of Systems  Constitutional: Negative for chills and fever.  HENT: Negative for congestion.   Eyes: Negative for visual disturbance.  Respiratory: Negative for cough and shortness of breath.   Cardiovascular: Negative.   Gastrointestinal: Negative for abdominal pain, nausea and vomiting.  Genitourinary: Negative for dysuria.       Negative incontinence   Musculoskeletal: Positive for back pain. Negative for neck pain.  Skin: Negative for rash and wound.  Neurological: Negative for weakness and numbness.     Physical Exam Updated Vital Signs BP 113/61   Pulse (!) 57   Temp 97.3 F (36.3 C) (Oral)   Resp 20   Ht 5' (1.524 m)   Wt 72.6 kg   SpO2 99%   BMI 31.25 kg/m  Physical Exam  Constitutional: She is oriented to person, place, and time. She appears well-developed and well-nourished. No distress.  HENT:  Head: Normocephalic and atraumatic.  Cardiovascular: Normal rate, regular rhythm and normal heart sounds.   No murmur heard. Pulmonary/Chest: Effort normal and breath sounds normal. No respiratory distress.  Abdominal: Soft. She exhibits no distension. There is no tenderness.  Musculoskeletal: She exhibits no edema.       Arms: TTP as depicted in image. Most significantly of left paraspinal musculature. Straight leg raises negative bilaterally for radicular symptoms.   Neurological: She is alert and oriented to person, place, and time.  Bilateral lower extremities neurovascularly intact.   Skin: Skin is warm and dry.  Nursing note and vitals reviewed.    ED Treatments / Results  Labs (all labs ordered are listed, but only abnormal results are displayed) Labs Reviewed  URINALYSIS, ROUTINE W REFLEX MICROSCOPIC - Abnormal; Notable for the following:       Result Value   Color, Urine STRAW (*)    Leukocytes, UA LARGE (*)    Bacteria, UA RARE (*)     Squamous Epithelial / LPF 0-5 (*)    All other components within normal limits  URINE CULTURE    EKG  EKG Interpretation None       Radiology No results found.  Procedures Procedures (including critical care time)  Medications Ordered in ED Medications  HYDROcodone-acetaminophen (NORCO/VICODIN) 5-325 MG per tablet 1 tablet (1 tablet Oral Given 05/10/16 0945)     Initial Impression / Assessment and Plan / ED Course  I have reviewed the triage vital signs and the nursing notes.  Pertinent labs & imaging results that were available during my care of the patient were reviewed by me and considered in my medical decision making (see chart for details).  Clinical Course    [name] is a 75 y.o. female who presents to ED for low back pain diffusely across low back. Does have some tenderness L>R. Very minimal midline tenderness. No red flag symptoms of back pain. No abdominal pain. UA shows large leuks and 6-30 WBC's. Urine sent for culture. On further discussion patient does note feeling like her bladder is not completely empty after voiding and some urinary hesitancy. Also notes that pain will radiate around toward lower abdomen which is different from typical low back pain. UTI possibly contributing to back pain today. Discussed awaiting culture results with patient vs. Treating UTI. Patient would like to proceed with treatment and I believe this is the best plan. Patient understands to follow up with PCP for recheck of urinary symptoms and to keep scheduled orthopedic appointment which is in four days as well. Return precautions discussed and all questions answered.  Patient discussed with Dr. Ashok Cordia who agrees with treatment plan.   Final Clinical Impressions(s) / ED Diagnoses   Final diagnoses:  Acute cystitis without hematuria    New Prescriptions Discharge Medication List as of 05/10/2016 10:41 AM    START taking these medications   Details  cephALEXin (KEFLEX) 500  MG capsule Take 1 capsule (500 mg total) by mouth 4 (four) times daily., Starting Sat 05/10/2016, Print    HYDROcodone-acetaminophen (NORCO/VICODIN) 5-325 MG tablet Take 1 tablet by mouth every 6 (six) hours as needed for severe pain., Starting Sat 05/10/2016, Print    ondansetron (ZOFRAN ODT) 4 MG disintegrating tablet Take 1 tablet (4 mg total) by mouth every 8 (eight) hours as needed for nausea or vomiting., Starting Sat 05/10/2016,  Print         [facility] Elihue Ebert, PA-C 05/10/16 1234    Lajean Saver, MD 05/10/16 [phone]

## 2016-05-10 NOTE — ED Notes (Signed)
Pt ambulated to restroom, but claimed she was having a lot of pain while ambulating. Pt wheeled back to room after using the restroom.

## 2016-05-10 NOTE — Discharge Instructions (Signed)
It was my pleasure taking care of you today!  Please take all of your antibiotics until finished! Please keep your scheduled appointment with your orthopedist on Wednesday. Stay very well hydrated with plenty of water throughout the day. Follow-up with your primary care physician in one week for recheck of ongoing symptoms. Please seek immediate care if you develop the following: Your symptoms are no better or worse in 3 days. There is severe lower abdominal pain.  You develop chills.  You have a fever.  There is nausea or vomiting.  There is continued burning or discomfort with urination.

## 2016-05-10 NOTE — ED Triage Notes (Addendum)
Pt states lower back pain since yesterday.  States hx of same that was improved after surgery.  Denies injury.  Acuity 3 for age alone.

## 2016-05-12 LAB — URINE CULTURE

## 2016-05-14 ENCOUNTER — Encounter: Payer: Self-pay | Admitting: Internal Medicine

## 2016-05-14 ENCOUNTER — Ambulatory Visit (INDEPENDENT_AMBULATORY_CARE_PROVIDER_SITE_OTHER): Payer: Medicare Other | Admitting: Internal Medicine

## 2016-05-14 VITALS — BP 112/60 | HR 85 | Temp 98.5°F | Wt 158.0 lb

## 2016-05-14 DIAGNOSIS — Z471 Aftercare following joint replacement surgery: Secondary | ICD-10-CM | POA: Diagnosis not present

## 2016-05-14 DIAGNOSIS — M549 Dorsalgia, unspecified: Secondary | ICD-10-CM

## 2016-05-14 DIAGNOSIS — M12811 Other specific arthropathies, not elsewhere classified, right shoulder: Secondary | ICD-10-CM | POA: Diagnosis not present

## 2016-05-14 DIAGNOSIS — M19011 Primary osteoarthritis, right shoulder: Secondary | ICD-10-CM | POA: Diagnosis not present

## 2016-05-14 DIAGNOSIS — Z87440 Personal history of urinary (tract) infections: Secondary | ICD-10-CM | POA: Diagnosis not present

## 2016-05-14 DIAGNOSIS — Z96612 Presence of left artificial shoulder joint: Secondary | ICD-10-CM | POA: Diagnosis not present

## 2016-05-14 LAB — POCT URINALYSIS DIPSTICK
Bilirubin, UA: NEGATIVE
Blood, UA: NEGATIVE
Glucose, UA: NEGATIVE
Ketones, UA: NEGATIVE
Leukocytes, UA: NEGATIVE
Nitrite, UA: NEGATIVE
Protein, UA: NEGATIVE
Spec Grav, UA: <=1.005
Urobilinogen, UA: NEGATIVE
pH, UA: 6.5

## 2016-05-14 MED ORDER — HYDROCODONE-ACETAMINOPHEN 5-325 MG PO TABS: ORAL_TABLET | ORAL | 0 refills | Status: DC

## 2016-05-14 MED ORDER — CYCLOBENZAPRINE HCL 10 MG PO TABS: 10.0000 mg | ORAL_TABLET | Freq: Three times a day (TID) | ORAL | 0 refills | Status: DC | PRN

## 2016-05-14 NOTE — Progress Notes (Signed)
   Subjective:    Patient ID: [name], female    DOB: [birth year], 75 y.o.   MRN: [id number]  HPI Long-standing history of right shoulder arthropathy and is to be scheduled for surgery after medical clearance. We were able to schedule her medical clearance/physical exam for January 8 today and cancel her February appointment. She wants to get her shoulder fixed as soon as possible.  She was in the emergency department December 16 complaining of bilateral low back pain left worse than right. Doesn't recall any particular injury to her back area. Was in a fair amount of pain. Was given Norco 5/325 #8 tablets. She is out of those. Orthopedist did not want to refill the pain medication. She saw him today and he presented her with form for surgical clearance.  She called here wanting refill on pain medication.  Says back pain has not improved.  Had abnormal U/A in the emergency department for culture grew multiple species.  She has no UTI symptoms.  History of rheumatoid arthritis    Review of Systems     Objective:   Physical Exam Urine specimen today is completely normal. She has decreased range of motion right upper extremity secondary to right shoulder arthropathy. She has pain left lower Biaxin. No weakness in the lower extremities.       Assessment & Plan:  Left back pain without sciatica-does not want to be on a short course of prednisone  Right shoulder arthropathy-appointment for medical clearance for surgery on January 8 with fasting labs on January 5  Rheumatoid arthritis  Plan: Refill hydrocodone/APAP 5/325 #60 one by mouth twice daily. No refill. Explained to her new rules regarding narcotic prescriptions. Declines prednisone. Flexeril 10 mg 3 times daily as needed for back pain.

## 2016-05-14 NOTE — Patient Instructions (Signed)
Appointment for physical examination here January 8 preoperative evaluation for right shoulder surgery. Flexeril 10 mg 3 times daily as needed for back pain. Hydrocodone/APAP 5/325 one by mouth twice daily as needed for back pain. Take this sparingly.

## 2016-05-30 ENCOUNTER — Other Ambulatory Visit: Payer: Medicare Other | Admitting: Internal Medicine

## 2016-05-30 NOTE — Progress Notes (Signed)
   Subjective:    Patient ID: [name], female    DOB: 30-May-1940, 76 y.o.   MRN: [id number]  HPI Unable to draw labs despite 2 attempts. Wanted to refer to [facility]. Draw station but pt says her grandson is in a hurry and she will return Monaday am for fasting labs    Review of Systems     Objective:   Physical Exam        Assessment & Plan:

## 2016-06-02 ENCOUNTER — Telehealth: Payer: Self-pay | Admitting: Internal Medicine

## 2016-06-02 ENCOUNTER — Encounter: Payer: Self-pay | Admitting: Internal Medicine

## 2016-06-02 ENCOUNTER — Other Ambulatory Visit: Payer: Medicare Other | Admitting: Internal Medicine

## 2016-06-02 ENCOUNTER — Other Ambulatory Visit: Payer: Self-pay | Admitting: Internal Medicine

## 2016-06-02 ENCOUNTER — Ambulatory Visit (INDEPENDENT_AMBULATORY_CARE_PROVIDER_SITE_OTHER): Payer: Medicare Other | Admitting: Internal Medicine

## 2016-06-02 VITALS — BP 138/78 | HR 81 | Temp 98.4°F | Ht 60.0 in | Wt 159.0 lb

## 2016-06-02 DIAGNOSIS — N183 Chronic kidney disease, stage 3 unspecified: Secondary | ICD-10-CM

## 2016-06-02 DIAGNOSIS — I10 Essential (primary) hypertension: Secondary | ICD-10-CM

## 2016-06-02 DIAGNOSIS — E785 Hyperlipidemia, unspecified: Secondary | ICD-10-CM

## 2016-06-02 DIAGNOSIS — D638 Anemia in other chronic diseases classified elsewhere: Secondary | ICD-10-CM

## 2016-06-02 DIAGNOSIS — Z0000 Encounter for general adult medical examination without abnormal findings: Secondary | ICD-10-CM

## 2016-06-02 DIAGNOSIS — D559 Anemia due to enzyme disorder, unspecified: Secondary | ICD-10-CM

## 2016-06-02 DIAGNOSIS — M19011 Primary osteoarthritis, right shoulder: Secondary | ICD-10-CM

## 2016-06-02 DIAGNOSIS — E118 Type 2 diabetes mellitus with unspecified complications: Secondary | ICD-10-CM

## 2016-06-02 DIAGNOSIS — G5692 Unspecified mononeuropathy of left upper limb: Secondary | ICD-10-CM

## 2016-06-02 DIAGNOSIS — M05711 Rheumatoid arthritis with rheumatoid factor of right shoulder without organ or systems involvement: Secondary | ICD-10-CM

## 2016-06-02 DIAGNOSIS — D649 Anemia, unspecified: Secondary | ICD-10-CM | POA: Diagnosis not present

## 2016-06-02 DIAGNOSIS — Z862 Personal history of diseases of the blood and blood-forming organs and certain disorders involving the immune mechanism: Secondary | ICD-10-CM | POA: Diagnosis not present

## 2016-06-02 DIAGNOSIS — G5603 Carpal tunnel syndrome, bilateral upper limbs: Secondary | ICD-10-CM | POA: Diagnosis not present

## 2016-06-02 DIAGNOSIS — E119 Type 2 diabetes mellitus without complications: Secondary | ICD-10-CM

## 2016-06-02 LAB — COMPLETE METABOLIC PANEL WITH GFR
ALT: 8 U/L (ref 6–29)
AST: 20 U/L (ref 10–35)
Albumin: 4.1 g/dL (ref 3.6–5.1)
Alkaline Phosphatase: 63 U/L (ref 33–130)
BUN: 29 mg/dL — ABNORMAL HIGH (ref 7–25)
CO2: 23 mmol/L (ref 20–31)
Calcium: 9.7 mg/dL (ref 8.6–10.4)
Chloride: 98 mmol/L (ref 98–110)
Creat: 0.95 mg/dL — ABNORMAL HIGH (ref 0.60–0.93)
GFR, Est African American: 67 mL/min (ref >=60)
GFR, Est Non African American: 58 mL/min — ABNORMAL LOW (ref >=60)
Glucose, Bld: 82 mg/dL (ref 65–99)
Potassium: 4.6 mmol/L (ref 3.5–5.3)
Sodium: 133 mmol/L — ABNORMAL LOW (ref 135–146)
Total Bilirubin: 0.4 mg/dL (ref 0.2–1.2)
Total Protein: 7.7 g/dL (ref 6.1–8.1)

## 2016-06-02 LAB — LIPID PANEL
Cholesterol: 174 mg/dL (ref <200)
HDL: 55 mg/dL (ref >50)
LDL Cholesterol: 104 mg/dL — ABNORMAL HIGH (ref <100)
Total CHOL/HDL Ratio: 3.2 Ratio (ref <5.0)
Triglycerides: 76 mg/dL (ref <150)
VLDL: 15 mg/dL (ref <30)

## 2016-06-02 LAB — POCT URINALYSIS DIPSTICK
Bilirubin, UA: NEGATIVE
Blood, UA: NEGATIVE
Glucose, UA: NEGATIVE
Ketones, UA: NEGATIVE
Leukocytes, UA: NEGATIVE
Nitrite, UA: NEGATIVE
Protein, UA: NEGATIVE
Spec Grav, UA: <=1.005
Urobilinogen, UA: NEGATIVE
pH, UA: 6

## 2016-06-02 LAB — CBC WITH DIFFERENTIAL/PLATELET
Basophils Absolute: 76 cells/uL (ref 0–200)
Basophils Relative: 1 %
Eosinophils Absolute: 304 cells/uL (ref 15–500)
Eosinophils Relative: 4 %
HCT: 33.5 % — ABNORMAL LOW (ref 35.0–45.0)
Hemoglobin: 10.8 g/dL — ABNORMAL LOW (ref 11.7–15.5)
Lymphocytes Relative: 32 %
Lymphs Abs: 2432 cells/uL (ref 850–3900)
MCH: 31.7 pg (ref 27.0–33.0)
MCHC: 32.2 g/dL (ref 32.0–36.0)
MCV: 98.2 fL (ref 80.0–100.0)
MPV: 8.8 fL (ref 7.5–12.5)
Monocytes Absolute: 684 cells/uL (ref 200–950)
Monocytes Relative: 9 %
Neutro Abs: 4104 cells/uL (ref 1500–7800)
Neutrophils Relative %: 54 %
Platelets: 508 10*3/uL — ABNORMAL HIGH (ref 140–400)
RBC: 3.41 MIL/uL — ABNORMAL LOW (ref 3.80–5.10)
RDW: 14.3 % (ref 11.0–15.0)
WBC: 7.6 10*3/uL (ref 3.8–10.8)

## 2016-06-02 LAB — HEMOGLOBIN A1C
Hgb A1c MFr Bld: 5.9 % — ABNORMAL HIGH (ref <5.7)
Mean Plasma Glucose: 123 mg/dL

## 2016-06-02 LAB — B12 AND FOLATE PANEL
Folate: 23.1 ng/mL (ref >5.4)
Vitamin B-12: 877 pg/mL (ref 200–1100)

## 2016-06-02 MED ORDER — HYDROCODONE-ACETAMINOPHEN 5-325 MG PO TABS: ORAL_TABLET | ORAL | 0 refills | Status: DC

## 2016-06-02 NOTE — Progress Notes (Signed)
Unable to obtain labs.  Patient sent to [address] location.

## 2016-06-02 NOTE — Addendum Note (Signed)
Addended by: Amado Coe on: 06/02/2016 09:32 AM   Modules accepted: Orders

## 2016-06-02 NOTE — Telephone Encounter (Signed)
B12 and folate added to labs.  Also verified patient is taking uloric 40mg  daily.

## 2016-06-02 NOTE — Progress Notes (Signed)
Subjective:    Patient ID: [name], female    DOB: [birth year], 76 y.o.   MRN: [id number]  HPI 76 year old Female here for physical exam, pain management and evaluation of medical issues in addition to clearance for right shoulder arthroplasty by Dr. Onnie Graham in the near future. Has rheumatoid arthritis. Had labs done today and are pending.   Was given Norco #60 tabs December 20 th and is out. Was supposed to take bid but took it tid if pain in back was severe. Right shoulder swollen and hurting as well.  Hx HTN, hyperlipidemia.  Rheumatologist has given her med for gout recently.  In September 2015 had left reverse total shoulder arthroplasty. Had laparoscopic assisted vaginal hysterectomy in 2016.  She has been evaluated by Hematologist for persistent anemia and was found to have anemia of chronic disease thought to be due to chronic kidney disease. Initially, anemia was noticed after laparoscopic vaginal hysterectomy BSO A&P repair in August 2016. Dr. Matthew Saras relayed that to my attention. Her MCV was 97.7 with platelet count 603,000 and normal white blood cell count. However, In January 2017 she had an iron level of 21. She was placed on iron supplementation and iron level improved to 76 by April 2017. She also was seen by gastroenterologist because of  iron deficiency anemia. She had previously had a  colonoscopy showing colonic diverticulosis 2014 by Dr. Watt Climes. On endoscopy 2014 she had esophageal stricture and gastritis. She had repeat study 07/16/2015 at [facility] showing a small hiatal hernia and chronic gastritis which was biopsied. Multiple gastric polyps were biopsied. Chronic PPI change noted. Pathology was negative for Helicobacter pylori. No evidence of malignancy.   She sees ophthalmologist at [facility] for glaucoma.  She has a history of hypertension, diabetes mellitus, hyperlipidemia, GE reflux and irritable bowel syndrome.  She was hospitalized in February 2012  for presumed viral illness with nausea vomiting and diarrhea.  She had a pulmonary embolus diagnosed January 2012 status post lumbar surgery for bilateral radiculopathy by Dr. Sherley Bounds. 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 2012. She underwent a right S1-S2 laminectomy with decompressive lumbar laminectomy.  Was treated for H. pylori gastritis in July 2000. A compression fracture T7 in 2000. History of abnormal Pap smear 1985 with colposcopy. Bilateral tubal ligation 1979. D&C for menorrhagia in 1989. Marsupialization of Bartholin's cyst 1987.  Social history: She is a widow. She is retired. Used to work in Water engineer at [employer]. She does not smoke or consume alcohol. She does not smoke or consume alcohol.  Family history: Mother died of an MI with history of hypertension. Mother died at age 65. One brother with history of adult onset diabetes. 5 sisters one of whom has diabetes. One daughter died of occult GI malignancy. One brother in good health. She has 9 children.  History of degenerative arthritis left thumb CMC joint. History of left wrist de Quervain's tendinitis.         Review of Systems complaining bitterly of shoulder pain     Objective:   Physical Exam  Constitutional: She is oriented to person, place, and time. She appears well-developed and well-nourished. No distress.  HENT:  Head: Normocephalic and atraumatic.  Right Ear: External ear normal.  Left Ear: External ear normal.  Mouth/Throat: Oropharynx is clear and moist.  Eyes: Conjunctivae and EOM are normal. Pupils are equal, round, and reactive to  light. Left eye exhibits no discharge. No scleral icterus.  Neck: Neck supple. No JVD present. No thyromegaly present.  Cardiovascular: Normal rate, regular rhythm, normal heart sounds and intact distal pulses.   No murmur heard. Pulmonary/Chest: She has no wheezes. She has no  rales.  Abdominal: Soft. Bowel sounds are normal. She exhibits no distension. There is no tenderness. There is no rebound and no guarding.  Genitourinary:  Genitourinary Comments: Deferred status post vaginal hysterectomy BSO A&P repair August 2016  Musculoskeletal: She exhibits no edema.  Lymphadenopathy:    She has no cervical adenopathy.  Neurological: She is alert and oriented to person, place, and time. She has normal reflexes. No cranial nerve deficit. Coordination normal.  Skin: Skin is warm and dry. No rash noted. She is not diaphoretic.  Psychiatric: She has a normal mood and affect. Her behavior is normal. Judgment and thought content normal.  Vitals reviewed.         Assessment & Plan:  HTN-Stable Hx gout Hx rheumatoid arthritis Hyperlipidemia Chronic kidney disease with stable creatinine 1.32 History of iron deficiency anemia now with normal iron levels. This is thought to been due to chronic gastritis. She also has anemia of chronic disease Bilateral carpal tunnel syndrome-has been evaluated by neurologist Memory loss- cannot remember 2 out of 3 items after 5 minutues- check B12 and folate in addition to TSH  Plan: Patient surgically cleared for right shoulder arthroplasty by Dr. Onnie Graham in the very near future. Needs to be maintained on PPI and is currently on omeprazole. Continue same medications. Return here in 6 months. Subjective:   Patient presents for Medicare Annual/Subsequent preventive examination.  Review Past Medical/Family/Social:See above   Risk Factors  Current exercise habits: Sedentary Dietary issues discussed: Low fat low carbohydrate  Cardiac risk factors:Hyperlipidemia, diabetes  Depression Screen  (Note: if answer to either of the following is "Yes", a more complete depression screening is indicated)   Over the past two weeks, have you felt down, depressed or hopeless? No  Over the past two weeks, have you felt little interest or pleasure  in doing things? No Have you lost interest or pleasure in daily life? No Do you often feel hopeless? No Do you cry easily over simple problems? No   Activities of Daily Living  In your present state of health, do you have any difficulty performing the following activities?:   Driving? I do not drive Managing money? No  Feeding yourself? No  Getting from bed to chair? Yes due to arthritis Climbing a flight of stairs? Yes due to arthritis Preparing food and eating?: No  Bathing or showering? No  Getting dressed: Due to arthritis and shoulder arthropathy Getting to the toilet? No  Using the toilet:No  Moving around from place to place: No  In the past year have you fallen or had a near fall?:No  Are you sexually active? No  Do you have more than one partner? No   Hearing Difficulties: No  Do you often ask people to speak up or repeat themselves? No  Do you experience ringing or noises in your ears? No  Do you have difficulty understanding soft or whispered voices? No  Do you feel that you have a problem with memory? No Do you often misplace items? No    Home Safety:  Do you have a smoke alarm at your residence? Yes Do you have grab bars in the bathroom? No Do you have throw rugs in your house? Yes  Cognitive Testing  Alert? Yes Normal Appearance?Yes  Oriented to person? Yes Place? Yes  Time? Yes  Recall of three objects? Yes  Can perform simple calculations? Yes  Displays appropriate judgment?Yes  Can read the correct time from a watch face?Yes   List the Names of Other Physician/Practitioners you currently use:  See referral list for the physicians patient is currently seeing.     Review of Systems: See above   Objective:     General appearance: Appears stated age and Frail Head: Normocephalic, without obvious abnormality, atraumatic  Eyes: conj clear, EOMi PEERLA  Ears: normal TM's and external ear canals both ears  Nose: Nares normal. Septum midline.  Mucosa normal. No drainage or sinus tenderness.  Throat: lips, mucosa, and tongue normal; teeth and gums normal  Neck: no adenopathy, no carotid bruit, no JVD, supple, symmetrical, trachea midline and thyroid not enlarged, symmetric, no tenderness/mass/nodules  No CVA tenderness.  Lungs: clear to auscultation bilaterally  Breasts: normal appearance, no masses or tenderness,  Heart: regular rate and rhythm, S1, S2 normal, no murmur, click, rub or gallop  Abdomen: soft, non-tender; bowel sounds normal; no masses, no organomegaly  Musculoskeletal: ROM normal in all joints, no crepitus, no deformity, Normal muscle strengthen. Back  is symmetric, no curvature. Skin: Skin color, texture, turgor normal. No rashes or lesions  Lymph nodes: Cervical, supraclavicular, and axillary nodes normal.  Neurologic: CN 2 -12 Normal, Normal symmetric reflexes. Normal coordination and gait  Psych: Alert & Oriented x 3, Mood appear stable.    Assessment:    Annual wellness medicare exam   Plan:    During the course of the visit the patient was educated and counseled about appropriate screening and preventive services including:   Annual mammogram  Annual flu vaccine     Patient Instructions (the written plan) was given to the patient.  Medicare Attestation  I have personally reviewed:  The patient's medical and social history  Their use of alcohol, tobacco or illicit drugs  Their current medications and supplements  The patient's functional ability including ADLs,fall risks, home safety risks, cognitive, and hearing and visual impairment  Diet and physical activities  Evidence for depression or mood disorders  The patient's weight, height, BMI, and visual acuity have been recorded in the chart. I have made referrals, counseling, and provided education to the patient based on review of the above and I have provided the patient with a written personalized care plan for preventive services.

## 2016-06-03 ENCOUNTER — Telehealth: Payer: Self-pay | Admitting: Internal Medicine

## 2016-06-03 LAB — IRON AND TIBC
%SAT: 23 % (ref 11–50)
Iron: 75 ug/dL (ref 45–160)
TIBC: 326 ug/dL (ref 250–450)
UIBC: 251 ug/dL (ref 125–400)

## 2016-06-03 LAB — MICROALBUMIN, URINE: Microalb, Ur: 0.2 mg/dL

## 2016-06-03 NOTE — Telephone Encounter (Signed)
-----   Message from [name], MD sent at 06/03/2016  9:55 AM EST ----- Add iron and TIBC dx is anemia

## 2016-06-03 NOTE — Telephone Encounter (Signed)
Iron and TIBC added to lab orders.

## 2016-06-10 DIAGNOSIS — M25571 Pain in right ankle and joints of right foot: Secondary | ICD-10-CM | POA: Diagnosis not present

## 2016-06-10 DIAGNOSIS — M059 Rheumatoid arthritis with rheumatoid factor, unspecified: Secondary | ICD-10-CM | POA: Diagnosis not present

## 2016-06-10 DIAGNOSIS — M1990 Unspecified osteoarthritis, unspecified site: Secondary | ICD-10-CM | POA: Diagnosis not present

## 2016-06-10 DIAGNOSIS — E790 Hyperuricemia without signs of inflammatory arthritis and tophaceous disease: Secondary | ICD-10-CM | POA: Diagnosis not present

## 2016-06-10 DIAGNOSIS — M25511 Pain in right shoulder: Secondary | ICD-10-CM | POA: Diagnosis not present

## 2016-06-21 ENCOUNTER — Ambulatory Visit (HOSPITAL_COMMUNITY)
Admission: EM | Admit: 2016-06-21 | Discharge: 2016-06-21 | Disposition: A | Discharge status: Home / Self Care | Payer: Medicare Other | Attending: Family Medicine | Admitting: Family Medicine

## 2016-06-21 ENCOUNTER — Encounter (HOSPITAL_COMMUNITY): Payer: Self-pay | Admitting: Family Medicine

## 2016-06-21 DIAGNOSIS — R110 Nausea: Secondary | ICD-10-CM

## 2016-06-21 DIAGNOSIS — K529 Noninfective gastroenteritis and colitis, unspecified: Secondary | ICD-10-CM

## 2016-06-21 MED ORDER — ONDANSETRON 4 MG PO TBDP: 4.0000 mg | ORAL_TABLET | Freq: Three times a day (TID) | ORAL | 0 refills | Status: DC | PRN

## 2016-06-21 NOTE — ED Provider Notes (Signed)
CSN: [id number]     Arrival date & time 06/21/16  1610 History   None    Chief Complaint  Patient presents with  . URI   (Consider location/radiation/quality/duration/timing/severity/associated sxs/prior Treatment) Patient c/o Nausea and fatigue for 2 days   The history is provided by the patient.  URI  Presenting symptoms: congestion, cough and fatigue   Severity:  Moderate Onset quality:  Sudden Duration:  1 week Timing:  Constant Chronicity:  New Relieved by:  Nothing Worsened by:  Nothing Ineffective treatments:  None tried Associated symptoms: sneezing, swollen glands and wheezing     Past Medical History:  Diagnosis Date  . Arthritis   . Cervical stenosis of spine   . Dysphasia   . Essential hypertension, benign   . GERD (gastroesophageal reflux disease)   . Glaucoma   . Heart murmur   . History of estrogen therapy   . Hypercholesteremia   . IBS (irritable bowel syndrome)   . Mitral regurgitation   . Pulmonary air embolism ([place])    states she had this wiht her back surgery  . Rheumatoid arthritis ([place])   . Seborrhea    ear   Past Surgical History:  Procedure Laterality Date  . ANTERIOR AND POSTERIOR REPAIR WITH SACROSPINOUS FIXATION N/A 01/08/2015   Procedure: ANTERIOR AND POSTERIOR REPAIR WITH possible SACROSPINOUS FIXATION;  Surgeon: Molli Posey, MD;  Location: [facility];  Service: Gynecology;  Laterality: N/A;  . CATARACT EXTRACTION Bilateral   . COLONOSCOPY    . EYE SURGERY    . LAPAROSCOPIC ASSISTED VAGINAL HYSTERECTOMY N/A 01/08/2015   Procedure: LAPAROSCOPIC ASSISTED VAGINAL HYSTERECTOMY;  Surgeon: Molli Posey, MD;  Location: [facility];  Service: Gynecology;  Laterality: N/A;  . LUMBAR FUSION  05/2010  . REVERSE SHOULDER ARTHROPLASTY Left 02/16/2014   Procedure: LEFT REVERSE TOTAL SHOULDER ARTHROPLASTY;  Surgeon: Marin Shutter, MD;  Location: [facility];  Service: Orthopedics;  Laterality: Left;  . TOE SURGERY    . TUBAL LIGATION     Family History   Problem Relation Age of Onset  . Healthy Daughter    Social History  Substance Use Topics  . Smoking status: Never Smoker  . Smokeless tobacco: Never Used  . Alcohol use No   OB History    No data available     Review of Systems  Constitutional: Positive for fatigue.  HENT: Positive for congestion and sneezing.   Respiratory: Positive for cough and wheezing.   Gastrointestinal: Positive for nausea.  Genitourinary: Negative.   Musculoskeletal: Negative.   Skin: Negative.   Psychiatric/Behavioral: Negative.     Allergies  Amoxicillin-pot clavulanate; Latex; and Tramadol  Home Medications   Prior to Admission medications   Medication Sig Start Date End Date Taking? Authorizing Provider  acetaminophen (TYLENOL) 650 MG CR tablet Take 650 mg by mouth daily as needed for pain.     Historical Provider, MD  alendronate (FOSAMAX) 70 MG tablet Take 1 tablet by mouth once a week. 12/31/15   Historical Provider, MD  aspirin 81 MG tablet Take 81 mg by mouth daily.     Historical Provider, MD  Calcium Carbonate-Vit D-Min (CALCIUM 1200 PO) Take 1 capsule by mouth daily.    Historical Provider, MD  cephALEXin (KEFLEX) 500 MG capsule Take 1 capsule (500 mg total) by mouth 4 (four) times daily. 05/10/16   Ozella Almond Ward, PA-C  cholecalciferol (VITAMIN D) 1000 units tablet Take 1,000 Units by mouth daily.    Historical Provider, MD  cyclobenzaprine (FLEXERIL)  10 MG tablet Take 1 tablet (10 mg total) by mouth 3 (three) times daily as needed for muscle spasms. 05/14/16   [name], MD  febuxostat (ULORIC) 40 MG tablet Take 40 mg by mouth daily.    Historical Provider, MD  HYDROcodone-acetaminophen (NORCO) 5-325 MG tablet One po q 12 hours for right shoulder arthropathy 06/02/16   [name], MD  ibuprofen (ADVIL,MOTRIN) 600 MG tablet Take 1 tablet (600 mg total) by mouth every 6 (six) hours as needed. Patient not taking: Reported on 06/02/2016 01/09/15   Molli Posey, MD  Iron-FA-B  Cmp-C-Biot-Probiotic (FUSION PLUS) CAPS TK 1 C PO QD 09/26/15   Historical Provider, MD  lisinopril (PRINIVIL,ZESTRIL) 5 MG tablet TAKE 1 TABLET BY MOUTH DAILY 09/13/15   [name], MD  mupirocin ointment (BACTROBAN) 2 % APPLY TO THE AFFECTED AREA TWICE DAILY AS NEEDED Patient not taking: Reported on 06/02/2016 02/13/16   [name], MD  omeprazole (PRILOSEC) 20 MG capsule TAKE 1 CAPSULE BY MOUTH DAILY 08/06/15   [name], MD  ondansetron (ZOFRAN ODT) 4 MG disintegrating tablet Take 1 tablet (4 mg total) by mouth every 8 (eight) hours as needed for nausea or vomiting. 05/10/16   Ozella Almond Ward, PA-C  ondansetron (ZOFRAN ODT) 4 MG disintegrating tablet Take 1 tablet (4 mg total) by mouth every 8 (eight) hours as needed for nausea or vomiting. 06/21/16   Lysbeth Penner, FNP  polyethylene glycol powder (GLYCOLAX/MIRALAX) powder MIX 1 CAPFUL IN 8 OUNCES OF WATER AND DRINK ONCE DAILY AS DIRECTED 04/27/16   [name], MD  simvastatin (ZOCOR) 20 MG tablet TAKE 1 TABLET BY MOUTH EVERY NIGHT AT BEDTIME 08/17/15   [name], MD  terconazole (TERAZOL 3) 0.8 % vaginal cream Place 1 applicator vaginally at bedtime. Patient not taking: Reported on 06/02/2016 09/03/15   [name], MD  triamcinolone cream (KENALOG) 0.1 % APPLY EXTERNALLY TO THE AFFECTED AREA THREE TIMES DAILY 06/17/15   [name], MD  triamterene-hydrochlorothiazide (MAXZIDE) 75-50 MG tablet TAKE 1 TABLET BY MOUTH EVERY DAY 04/03/16   [name], MD   Meds Ordered and Administered this Visit  Medications - No data to display  There were no vitals taken for this visit. No data found.   Physical Exam  Constitutional: She is oriented to person, place, and time. She appears well-nourished.  HENT:  Head: Atraumatic.  Neck: Neck supple.  Cardiovascular: Normal rate, regular rhythm and normal heart sounds.   Pulmonary/Chest: Effort normal and breath sounds normal.  Abdominal: Soft.  Neurological: She is alert and oriented  to person, place, and time.  Nursing note and vitals reviewed.   Urgent Care Course     Procedures (including critical care time)  Labs Review Labs Reviewed - No data to display  Imaging Review No results found.   Visual Acuity Review  Right Eye Distance:   Left Eye Distance:   Bilateral Distance:    Right Eye Near:   Left Eye Near:    Bilateral Near:         MDM   1. Gastroenteritis   2. Nausea    Zofran 4mg  one po tid prn #21 Push po fluids, rest, tylenol and motrin otc prn as directed for fever, arthralgias, and myalgias.  Follow up prn if sx's continue or persist.   Lysbeth Penner, FNP 06/21/16 [place], FNP 06/21/16 1806

## 2016-06-21 NOTE — ED Triage Notes (Signed)
Pt here for URI symptoms.  

## 2016-06-25 NOTE — Patient Instructions (Signed)
Approved for right shoulder arthroplasty in the near future. Otherwise return in 6 months here for follow-up of multiple medical issues

## 2016-06-27 ENCOUNTER — Encounter: Payer: Self-pay | Admitting: Internal Medicine

## 2016-07-02 ENCOUNTER — Other Ambulatory Visit: Payer: Self-pay | Admitting: Internal Medicine

## 2016-07-22 ENCOUNTER — Encounter: Payer: Self-pay | Admitting: Internal Medicine

## 2016-07-22 ENCOUNTER — Ambulatory Visit (INDEPENDENT_AMBULATORY_CARE_PROVIDER_SITE_OTHER): Payer: Medicare Other | Admitting: Internal Medicine

## 2016-07-22 VITALS — BP 120/70 | HR 71 | Temp 98.4°F | Wt 156.0 lb

## 2016-07-22 DIAGNOSIS — J029 Acute pharyngitis, unspecified: Secondary | ICD-10-CM | POA: Diagnosis not present

## 2016-07-22 DIAGNOSIS — J069 Acute upper respiratory infection, unspecified: Secondary | ICD-10-CM | POA: Diagnosis not present

## 2016-07-22 LAB — POCT RAPID STREP A (OFFICE): Rapid Strep A Screen: NEGATIVE

## 2016-07-22 MED ORDER — LEVOFLOXACIN 500 MG PO TABS: 500.0000 mg | ORAL_TABLET | Freq: Every day | ORAL | 0 refills | Status: DC

## 2016-07-22 MED ORDER — FLUCONAZOLE 150 MG PO TABS
150.0000 mg | ORAL_TABLET | Freq: Once | ORAL | 1 refills | Status: AC
Start: 2016-07-22 — End: 2016-07-22

## 2016-07-22 MED ORDER — HYDROCODONE-HOMATROPINE 5-1.5 MG/5ML PO SYRP: 5.0000 mL | ORAL_SOLUTION | Freq: Three times a day (TID) | ORAL | 0 refills | Status: DC | PRN

## 2016-07-22 NOTE — Progress Notes (Signed)
   Subjective:    Patient ID: [name], female    DOB: 08-29-1940, 76 y.o.   MRN: [id number]  HPI  76 year old Black Female in today with cough headache and sore throat. Has congested cough. Has malaise and fatigue. Says sputum is discolored. No documented fever today. No shaking chills. Had flu vaccine in October. Was seen at urgent care in January with gastroenteritis. In December reportedly had acute cystitis.  Is scheduled for reverse right shoulder arthroplasty by Dr. Onnie Graham on March 15.    Review of Systems see above     Objective:   Physical Exam  Skin warm and dry. Nodes none. Pharynx injected without exudate. Rapid strep screen negative. Left TM is full but not red. Right TM clear. Neck is supple. Chest clear to auscultation but she has a congested cough.      Assessment & Plan:  Acute pharyngitis  Acute URI  Plan: Hycodan 1 teaspoon by mouth every 8 hours when necessary cough. Levaquin 500 milligrams daily for 10 days. Rest and drink plenty of fluids. Diflucan 150 mg tablet with 1 refill and she developed Candida vaginitis while on antibiotics.

## 2016-07-22 NOTE — Patient Instructions (Signed)
Hycodan 1 teaspoon by mouth every 8 hours when necessary cough. Levaquin 500 milligrams daily with a meal for 10 days. Diflucan if needed for Candida vaginitis. Rest and drink plenty of fluids.

## 2016-07-29 NOTE — Pre-Procedure Instructions (Addendum)
ALFREIDA WOJTOWICZ  07/29/2016      [id number] - [address], [place] [address] Phone: [phone] Fax: [phone]    Your procedure is scheduled on Tues. Mar. 15 @ 730 AM.  Report to [facility] at 530 A.M.  Call this number if you have problems the morning of surgery:  [phone]   Remember:  Do not eat food or drink liquids after midnight.  Take these medicines the morning of surgery with A SIP OF WATER pain pill if needed, cyclobenzaprine (flexeril), ondansetron (zofran) if needed.  Stop aspirin 81 mg. Stop taking an any vitamins or herbal medications. No Goody's, Bc's, Aleve, Advil, Ibuprofen or fish oil.   Do not wear jewelry, make-up or nail polish.  Do not wear lotions, powders, or perfumes, or deoderant.  Do not shave 48 hours prior to surgery.    Do not bring valuables to the hospital.   [facility] is not responsible for any belongings or valuables.  Contacts, dentures or bridgework may not be worn into surgery.  Leave your suitcase in the car.  After surgery it may be brought to your room.  For patients admitted to the hospital, discharge time will be determined by your treatment team.  Patients discharged the day of surgery will not be allowed to drive home.    Special instructions:   [facility]- Preparing For Surgery  Before surgery, you can play an important role. Because skin is not sterile, your skin needs to be as free of germs as possible. You can reduce the number of germs on your skin by washing with CHG (chlorahexidine gluconate) Soap before surgery.  CHG is an antiseptic cleaner which kills germs and bonds with the skin to continue killing germs even after washing.  Please do not use if you have an allergy to CHG or antibacterial soaps. If your skin becomes reddened/irritated stop using the CHG.  Do not shave (including  legs and underarms) for at least 48 hours prior to first CHG shower. It is OK to shave your face.  Please follow these instructions carefully.   1. Shower the NIGHT BEFORE SURGERY and the MORNING OF SURGERY with CHG.   2. If you chose to wash your hair, wash your hair first as usual with your normal shampoo.  3. After you shampoo, rinse your hair and body thoroughly to remove the shampoo.  4. Use CHG as you would any other liquid soap. You can apply CHG directly to the skin and wash gently with a scrungie or a clean washcloth.   5. Apply the CHG Soap to your body ONLY FROM THE NECK DOWN.  Do not use on open wounds or open sores. Avoid contact with your eyes, ears, mouth and genitals (private parts). Wash genitals (private parts) with your normal soap.  6. Wash thoroughly, paying special attention to the area where your surgery will be performed.  7. Thoroughly rinse your body with warm water from the neck down.  8. DO NOT shower/wash with your normal soap after using and rinsing off the CHG Soap.  9. Pat yourself dry with a CLEAN TOWEL.   10. Wear CLEAN PAJAMAS   11. Place CLEAN SHEETS on your bed the night of your first shower and DO NOT SLEEP WITH PETS.    Day of Surgery: Do not apply any deodorants/lotions. Please wear  clean clothes to the hospital/surgery center.      Please read over the following fact sheets that you were given. Pain Booklet, Coughing and Deep Breathing, MRSA Information and Surgical Site Infection Prevention

## 2016-07-30 ENCOUNTER — Encounter (HOSPITAL_COMMUNITY)
Admission: RE | Admit: 2016-07-30 | Discharge: 2016-07-30 | Disposition: A | Discharge status: Home / Self Care | Payer: Medicare Other | Source: Ambulatory Visit | Attending: Orthopedic Surgery | Admitting: Orthopedic Surgery

## 2016-07-30 ENCOUNTER — Encounter (HOSPITAL_COMMUNITY): Payer: Self-pay

## 2016-07-30 DIAGNOSIS — D649 Anemia, unspecified: Secondary | ICD-10-CM | POA: Insufficient documentation

## 2016-07-30 DIAGNOSIS — M069 Rheumatoid arthritis, unspecified: Secondary | ICD-10-CM | POA: Diagnosis not present

## 2016-07-30 DIAGNOSIS — H409 Unspecified glaucoma: Secondary | ICD-10-CM | POA: Insufficient documentation

## 2016-07-30 DIAGNOSIS — Z01810 Encounter for preprocedural cardiovascular examination: Secondary | ICD-10-CM | POA: Insufficient documentation

## 2016-07-30 DIAGNOSIS — N183 Chronic kidney disease, stage 3 (moderate): Secondary | ICD-10-CM | POA: Diagnosis not present

## 2016-07-30 DIAGNOSIS — Z96619 Presence of unspecified artificial shoulder joint: Secondary | ICD-10-CM | POA: Insufficient documentation

## 2016-07-30 DIAGNOSIS — I129 Hypertensive chronic kidney disease with stage 1 through stage 4 chronic kidney disease, or unspecified chronic kidney disease: Secondary | ICD-10-CM | POA: Diagnosis not present

## 2016-07-30 DIAGNOSIS — I4510 Unspecified right bundle-branch block: Secondary | ICD-10-CM | POA: Insufficient documentation

## 2016-07-30 DIAGNOSIS — Z01812 Encounter for preprocedural laboratory examination: Secondary | ICD-10-CM | POA: Diagnosis not present

## 2016-07-30 DIAGNOSIS — E1122 Type 2 diabetes mellitus with diabetic chronic kidney disease: Secondary | ICD-10-CM | POA: Diagnosis not present

## 2016-07-30 LAB — BASIC METABOLIC PANEL
Anion gap: 10 (ref 5–15)
BUN: 34 mg/dL — ABNORMAL HIGH (ref 6–20)
CO2: 23 mmol/L (ref 22–32)
Calcium: 9.2 mg/dL (ref 8.9–10.3)
Chloride: 104 mmol/L (ref 101–111)
Creatinine, Ser: 1.32 mg/dL — ABNORMAL HIGH (ref 0.44–1.00)
GFR calc Af Amer: 44 mL/min — ABNORMAL LOW (ref >60)
GFR calc non Af Amer: 38 mL/min — ABNORMAL LOW (ref >60)
Glucose, Bld: 79 mg/dL (ref 65–99)
Potassium: 4.2 mmol/L (ref 3.5–5.1)
Sodium: 137 mmol/L (ref 135–145)

## 2016-07-30 LAB — CBC
HCT: 30.7 % — ABNORMAL LOW (ref 36.0–46.0)
Hemoglobin: 10 g/dL — ABNORMAL LOW (ref 12.0–15.0)
MCH: 31.3 pg (ref 26.0–34.0)
MCHC: 32.6 g/dL (ref 30.0–36.0)
MCV: 95.9 fL (ref 78.0–100.0)
Platelets: 506 10*3/uL — ABNORMAL HIGH (ref 150–400)
RBC: 3.2 MIL/uL — ABNORMAL LOW (ref 3.87–5.11)
RDW: 14.5 % (ref 11.5–15.5)
WBC: 10.9 10*3/uL — ABNORMAL HIGH (ref 4.0–10.5)

## 2016-07-30 LAB — SURGICAL PCR SCREEN
MRSA, PCR: NEGATIVE
Staphylococcus aureus: NEGATIVE

## 2016-07-30 NOTE — Progress Notes (Signed)
PCP - Tedra [place] Cardiologist - denies  Chest x-ray - not needed EKG - 07/30/16 Stress Test -denies  ECHO - denies Cardiac Cath - denies  Sleep Study - denies   Patient denies shortness of breath, fever, cough and chest pain at PAT appointment   Patient verbalized understanding of instructions that was given to them at the PAT appointment. Patient expressed that there were no further questions.  Patient was also instructed that they will need to review over the PAT instructions again at home before the surgery.

## 2016-07-31 ENCOUNTER — Encounter (HOSPITAL_COMMUNITY): Payer: Self-pay | Admitting: Emergency Medicine

## 2016-07-31 ENCOUNTER — Emergency Department (HOSPITAL_COMMUNITY)
Admission: EM | Admit: 2016-07-31 | Discharge: 2016-08-01 | Disposition: A | Discharge status: Home / Self Care | Payer: Medicare Other | Attending: Emergency Medicine | Admitting: Emergency Medicine

## 2016-07-31 ENCOUNTER — Telehealth: Payer: Self-pay | Admitting: Internal Medicine

## 2016-07-31 DIAGNOSIS — I129 Hypertensive chronic kidney disease with stage 1 through stage 4 chronic kidney disease, or unspecified chronic kidney disease: Secondary | ICD-10-CM | POA: Insufficient documentation

## 2016-07-31 DIAGNOSIS — R52 Pain, unspecified: Secondary | ICD-10-CM | POA: Diagnosis not present

## 2016-07-31 DIAGNOSIS — Z96612 Presence of left artificial shoulder joint: Secondary | ICD-10-CM | POA: Diagnosis not present

## 2016-07-31 DIAGNOSIS — Z79899 Other long term (current) drug therapy: Secondary | ICD-10-CM | POA: Insufficient documentation

## 2016-07-31 DIAGNOSIS — N183 Chronic kidney disease, stage 3 (moderate): Secondary | ICD-10-CM | POA: Insufficient documentation

## 2016-07-31 DIAGNOSIS — M5489 Other dorsalgia: Secondary | ICD-10-CM | POA: Diagnosis not present

## 2016-07-31 DIAGNOSIS — G8929 Other chronic pain: Secondary | ICD-10-CM | POA: Diagnosis not present

## 2016-07-31 DIAGNOSIS — Z91040 Latex allergy status: Secondary | ICD-10-CM | POA: Insufficient documentation

## 2016-07-31 DIAGNOSIS — M545 Low back pain, unspecified: Secondary | ICD-10-CM

## 2016-07-31 DIAGNOSIS — Z7982 Long term (current) use of aspirin: Secondary | ICD-10-CM | POA: Insufficient documentation

## 2016-07-31 DIAGNOSIS — E1122 Type 2 diabetes mellitus with diabetic chronic kidney disease: Secondary | ICD-10-CM | POA: Insufficient documentation

## 2016-07-31 MED ORDER — FENTANYL CITRATE (PF) 100 MCG/2ML IJ SOLN
100.0000 ug | Freq: Once | INTRAMUSCULAR | Status: AC
  Administered 2016-07-31: 100 ug via INTRAVENOUS
  Filled 2016-07-31: qty 2

## 2016-07-31 MED ORDER — CYCLOBENZAPRINE HCL 10 MG PO TABS
10.0000 mg | ORAL_TABLET | Freq: Once | ORAL | Status: AC
Start: 2016-07-31 — End: 2016-07-31
  Administered 2016-07-31: 10 mg via ORAL
  Filled 2016-07-31: qty 1

## 2016-07-31 MED ORDER — HYDROCODONE-ACETAMINOPHEN 5-325 MG PO TABS: 1.0000 | ORAL_TABLET | ORAL | 0 refills | Status: DC | PRN

## 2016-07-31 MED ORDER — HYDROCODONE-ACETAMINOPHEN 5-325 MG PO TABS
1.0000 | ORAL_TABLET | Freq: Once | ORAL | Status: AC
  Administered 2016-07-31: 1 via ORAL
  Filled 2016-07-31: qty 1

## 2016-07-31 MED ORDER — LIDOCAINE 5 % EX PTCH: 1.0000 | MEDICATED_PATCH | CUTANEOUS | 0 refills | Status: DC

## 2016-07-31 MED ORDER — LIDOCAINE 5 % EX PTCH
1.0000 | MEDICATED_PATCH | Freq: Every day | CUTANEOUS | Status: DC
  Administered 2016-07-31: 1 via TRANSDERMAL
  Filled 2016-07-31: qty 1

## 2016-07-31 NOTE — ED Triage Notes (Signed)
Pt BIB EMS from home. Hx of chronic back pain. Began having spasms about 78mo ago, but resolved. About 2 days ago, pt began having severe lumbar pain similar to previous spasms. Per EMS, pt unable to move or be moved without intervention. Given 118mcg fentanyl on scene, which took pain from 10/10 to a 1/10. Pt also given a robaxin tablet by a family member approx 1730 today. Denies fall or other injury, heavy activity, numbness, tingling in legs.

## 2016-07-31 NOTE — ED Notes (Signed)
Placed bedside commode in pt's room.

## 2016-07-31 NOTE — ED Provider Notes (Signed)
[facility] DEPT Provider Note   CSN: [id number] Arrival date & time: 07/31/16  1905     History   Chief Complaint Chief Complaint  Patient presents with  . Back Pain    HPI [name] is a 76 y.o. female with past medical history significant for back pain status post back surgery, hypertension, murmur, rheumatoid arthritis, and GERD who presents with back pain. Patient reports that last month, she had a low back spasm that felt similar. She says that she received pain medications with Norco and then muscle relaxants which helped her. She reports that it took the pain away and she did better for the last few weeks. She reports that yesterday, she had return of the back pain. She describes as 13 out of 10 in severity and located across her low back. It is both paraspinally in the midline. She denies any recent traumas, twisting, or falls. She says the pain is the same as last time. She got fentanyl with EMS with the pain dropping down to a 1 out of 10. On my initial evaluation, she reports the pain as a 5 out of 10 and can. She denies any loss of bowel or bladder function, denies numbness, daily, or weakness of the lower extremity. She denies any recent fevers, chills, or any infectious-like symptoms. She denies nausea, vomiting, constipation, diarrhea, or dysuria. She denies any abdominal pain or chest pain. She denies any other complains upon arrival.   HPI  Past Medical History:  Diagnosis Date  . Arthritis   . Cervical stenosis of spine   . Dysphasia   . Essential hypertension, benign   . GERD (gastroesophageal reflux disease)   . Glaucoma   . Heart murmur   . History of estrogen therapy   . Hypercholesteremia   . IBS (irritable bowel syndrome)   . Mitral regurgitation   . Pulmonary air embolism ([place])    states she had this wiht her back surgery  . Rheumatoid arthritis ([place])   . Seborrhea    patient denies    Patient Active Problem List   Diagnosis Date Noted  .  CKD (chronic kidney disease) stage 3, GFR 30-59 ml/min 10/01/2015  . Anemia 05/10/2015  . Uterine prolapse 01/08/2015  . Glaucoma 09/19/2014  . S/P shoulder replacement 02/16/2014  . Mouth ulcers 05/24/2013  . Rheumatoid arthritis ([place]) 09/23/2012  . Diabetes mellitus ([place]) 01/13/2011  . Hyperlipidemia 01/13/2011  . Hypertension 01/13/2011  . GE reflux 01/13/2011  . Glaucoma 01/13/2011  . Irritable bowel syndrome 01/13/2011  . Hx pulmonary embolism 01/13/2011    Past Surgical History:  Procedure Laterality Date  . ANTERIOR AND POSTERIOR REPAIR WITH SACROSPINOUS FIXATION N/A 01/08/2015   Procedure: ANTERIOR AND POSTERIOR REPAIR WITH possible SACROSPINOUS FIXATION;  Surgeon: Molli Posey, MD;  Location: [facility];  Service: Gynecology;  Laterality: N/A;  . CATARACT EXTRACTION Bilateral   . COLONOSCOPY    . EYE SURGERY    . LAPAROSCOPIC ASSISTED VAGINAL HYSTERECTOMY N/A 01/08/2015   Procedure: LAPAROSCOPIC ASSISTED VAGINAL HYSTERECTOMY;  Surgeon: Molli Posey, MD;  Location: [facility];  Service: Gynecology;  Laterality: N/A;  . LUMBAR FUSION  05/2010  . REVERSE SHOULDER ARTHROPLASTY Left 02/16/2014   Procedure: LEFT REVERSE TOTAL SHOULDER ARTHROPLASTY;  Surgeon: Marin Shutter, MD;  Location: [facility];  Service: Orthopedics;  Laterality: Left;  . TOE SURGERY    . TUBAL LIGATION      OB History    No data available  Home Medications    Prior to Admission medications   Medication Sig Start Date End Date Taking? Authorizing Provider  acetaminophen (TYLENOL) 650 MG CR tablet Take 1,300 mg by mouth daily as needed for pain.     Historical Provider, MD  aspirin 81 MG tablet Take 81 mg by mouth daily.     Historical Provider, MD  Calcium Carbonate-Vit D-Min (CALCIUM 1200 PO) Take 1 capsule by mouth daily.    Historical Provider, MD  cholecalciferol (VITAMIN D) 1000 units tablet Take 1,000 Units by mouth daily.    Historical Provider, MD  cyclobenzaprine (FLEXERIL) 10 MG tablet Take  1 tablet (10 mg total) by mouth 3 (three) times daily as needed for muscle spasms. Patient not taking: Reported on 07/28/2016 05/14/16   [name], MD  HYDROcodone-acetaminophen [facility]) 5-325 MG tablet One po q 12 hours for right shoulder arthropathy Patient not taking: Reported on 07/28/2016 06/02/16   [name], MD  HYDROcodone-homatropine [facility]) 5-1.5 MG/5ML syrup Take 5 mLs by mouth every 8 (eight) hours as needed for cough. 07/22/16   [name], MD  ibuprofen (ADVIL,MOTRIN) 600 MG tablet Take 1 tablet (600 mg total) by mouth every 6 (six) hours as needed. Patient not taking: Reported on 07/28/2016 01/09/15   Molli Posey, MD  Iron-FA-B Cmp-C-Biot-Probiotic (FUSION PLUS) CAPS TAKE 1 TABLET ONCE DAILY 09/26/15   Historical Provider, MD  levofloxacin (LEVAQUIN) 500 MG tablet Take 1 tablet (500 mg total) by mouth daily. Patient not taking: Reported on 07/28/2016 07/22/16   [name], MD  lisinopril (PRINIVIL,ZESTRIL) 5 MG tablet TAKE 1 TABLET BY MOUTH DAILY 09/13/15   [name], MD  mupirocin ointment (BACTROBAN) 2 % APPLY TO THE AFFECTED AREA TWICE DAILY AS NEEDED Patient not taking: Reported on 07/28/2016 02/13/16   [name], MD  omeprazole (PRILOSEC) 20 MG capsule TAKE 1 CAPSULE BY MOUTH DAILY Patient taking differently: TAKE 1 CAPSULE BY MOUTH DAILY IF NEEDED FOR INDIGESTION 08/06/15   [name], MD  ondansetron (ZOFRAN ODT) 4 MG disintegrating tablet Take 1 tablet (4 mg total) by mouth every 8 (eight) hours as needed for nausea or vomiting. Patient not taking: Reported on 07/28/2016 05/10/16   [facility] Ward, PA-C  ondansetron (ZOFRAN ODT) 4 MG disintegrating tablet Take 1 tablet (4 mg total) by mouth every 8 (eight) hours as needed for nausea or vomiting. 06/21/16   Lysbeth Penner, FNP  polyethylene glycol powder (GLYCOLAX/MIRALAX) powder MIX 1 CAPFUL IN 8 OUNCES OF WATER AND DRINK ONCE DAILY AS DIRECTED Patient taking differently: MIX 1 CAPFUL IN 8 OUNCES OF WATER AND  DRINK ONCE DAILY AS DIRECTED AS NEEDED FOR CONSTIPATION 04/27/16   [name], MD  simvastatin (ZOCOR) 20 MG tablet TAKE 1 TABLET BY MOUTH EVERY NIGHT AT BEDTIME 08/17/15   [name], MD  terconazole (TERAZOL 3) 0.8 % vaginal cream Place 1 applicator vaginally at bedtime. Patient not taking: Reported on 07/28/2016 09/03/15   [name], MD  triamcinolone cream (KENALOG) 0.1 % APPLY EXTERNALLY TO THE AFFECTED AREA THREE TIMES DAILY Patient not taking: Reported on 07/28/2016 06/17/15   [name], MD  triamterene-hydrochlorothiazide (MAXZIDE) 75-50 MG tablet TAKE 1 TABLET BY MOUTH EVERY DAY 07/02/16   [name], MD  vitamin C (ASCORBIC ACID) 500 MG tablet Take 500 mg by mouth daily.    Historical Provider, MD    Family History Family History  Problem Relation Age of Onset  .  Healthy Daughter     Social History Social History  Substance Use Topics  . Smoking status: Never Smoker  . Smokeless tobacco: Never Used  . Alcohol use No     Allergies   Amoxicillin-pot clavulanate; Latex; and Tramadol   Review of Systems Review of Systems  Constitutional: Negative for activity change, chills, diaphoresis, fatigue and fever.  HENT: Negative for congestion and rhinorrhea.   Eyes: Negative for visual disturbance.  Respiratory: Negative for cough, chest tightness, shortness of breath and stridor.   Cardiovascular: Negative for chest pain, palpitations and leg swelling.  Gastrointestinal: Negative for abdominal distention, abdominal pain, constipation, diarrhea, nausea and vomiting.  Genitourinary: Negative for difficulty urinating, dysuria, flank pain, frequency, hematuria, menstrual problem, pelvic pain, vaginal bleeding and vaginal discharge.  Musculoskeletal: Positive for back pain. Negative for neck pain and neck stiffness.  Skin: Negative for rash and wound.  Neurological: Negative for dizziness, syncope, weakness, light-headedness, numbness and headaches.    Psychiatric/Behavioral: Negative for agitation and confusion.  All other systems reviewed and are negative.    Physical Exam Updated Vital Signs BP 121/82 (BP Location: Left Arm)   Pulse 62   Temp 98.1 F (36.7 C) (Oral)   Resp 16   Ht 5\' 2"  (1.575 m)   Wt 155 lb (70.3 kg)   SpO2 98%   BMI 28.35 kg/m   Physical Exam  Constitutional: She is oriented to person, place, and time. She appears well-developed and well-nourished. No distress.  HENT:  Head: Normocephalic and atraumatic.  Right Ear: External ear normal.  Left Ear: External ear normal.  Nose: Nose normal.  Mouth/Throat: Oropharynx is clear and moist. No oropharyngeal exudate.  Eyes: Conjunctivae and EOM are normal. Pupils are equal, round, and reactive to light.  Neck: Normal range of motion. Neck supple.  Cardiovascular: Normal rate, regular rhythm and intact distal pulses.   Murmur heard. Pulmonary/Chest: Effort normal and breath sounds normal. No stridor. No respiratory distress. She has no wheezes. She exhibits no tenderness.  Abdominal: Soft. She exhibits no distension. There is no tenderness. There is no rebound.  Musculoskeletal: She exhibits tenderness. She exhibits no deformity.       Lumbar back: She exhibits tenderness, pain and spasm.       Back:  Neurological: She is alert and oriented to person, place, and time. She has normal reflexes. She is not disoriented. She displays no tremor and normal reflexes. No cranial nerve deficit or sensory deficit. She exhibits normal muscle tone. Coordination normal. GCS eye subscore is 4. GCS verbal subscore is 5. GCS motor subscore is 6.  No numbness, tingling, or weakness of lower extremities.  Skin: Skin is warm. Capillary refill takes less than 2 seconds. No rash noted. She is not diaphoretic. No erythema.  Psychiatric: She has a normal mood and affect.  Nursing note and vitals reviewed.    ED Treatments / Results  Labs (all labs ordered are listed, but only  abnormal results are displayed) Labs Reviewed - No data to display  EKG  EKG Interpretation None       Radiology No results found.  Procedures Procedures (including critical care time)  Medications Ordered in ED Medications  HYDROcodone-acetaminophen (NORCO/VICODIN) 5-325 MG per tablet 1 tablet (not administered)  lidocaine (LIDODERM) 5 % 1 patch (not administered)  cyclobenzaprine (FLEXERIL) tablet 10 mg (10 mg Oral Given 07/31/16 1952)  fentaNYL (SUBLIMAZE) injection 100 mcg (100 mcg Intravenous Given 07/31/16 1953)  fentaNYL (SUBLIMAZE) injection 100 mcg (100 mcg Intravenous  Given 07/31/16 2108)     Initial Impression / Assessment and Plan / ED Course  I have reviewed the triage vital signs and the nursing notes.  Pertinent labs & imaging results that were available during my care of the patient were reviewed by me and considered in my medical decision making (see chart for details).     [name] is a 76 y.o. female with past medical history significant for back pain status post back surgery, hypertension, murmur, rheumatoid arthritis, and GERD who presents with back pain.    history and exam are seen above.   On exam, patient is tenderness across her low back. Patient had no pain with leg manipulation. Patient   had symmetric pulses, strength, and sensation in the lower extremities. No numbness. No thoracic back tenderness, cervical tenderness, abdominal tenderness, or chest tenderness. Exam otherwise unremarkable. No focal neurologic deficits.  Based on patient's symptoms, suspect return of musculoskeletal back pain and spasm. Given the lack of new trauma, do not feel patient needs imaging at this time. Given the significant improvement in pain with medications, these medicines will be redosed. Do not feel patient has an infection. With lack of bowel or bladder loss of control, numbness, weakness, do not feel patient has cauda equina or other cord injury.   Patient was  given Flexeril and fentanyl and reassessed.  Patient reported transient improvement in pain. Patient will now be given a hydrocodone pill as this is the strongest pain medicine she can tolerate as well as a Lidoderm patch. Patient will be reassessed. Anticipate discharge with prescription for Norco and Lidoderm patches and outpatient follow-up with her spine team and PCP for further management.    Final Clinical Impressions(s) / ED Diagnoses   Final diagnoses:  Acute bilateral low back pain without sciatica    New Prescriptions Discharge Medication List as of 07/31/2016 11:47 PM    START taking these medications   Details  !! HYDROcodone-acetaminophen (NORCO/VICODIN) 5-325 MG tablet Take 1 tablet by mouth every 4 (four) hours as needed., Starting Thu 07/31/2016, Print    lidocaine (LIDODERM) 5 % Place 1 patch onto the skin daily. Remove & Discard patch within 12 hours or as directed by MD, Starting Thu 07/31/2016, Print     !! - Potential duplicate medications found. Please discuss with provider.      Clinical Impression: 1. Acute bilateral low back pain without sciatica     Disposition: Discharge  Condition: Good  I have discussed the results, Dx and Tx plan with the pt(& family if present). He/she/they expressed understanding and agree(s) with the plan. Discharge instructions discussed at great length. Strict return precautions discussed and pt &/or family have verbalized understanding of the instructions. No further questions at time of discharge.    Discharge Medication List as of 07/31/2016 11:47 PM    START taking these medications   Details  !! HYDROcodone-acetaminophen (NORCO/VICODIN) 5-325 MG tablet Take 1 tablet by mouth every 4 (four) hours as needed., Starting Thu 07/31/2016, Print    lidocaine (LIDODERM) 5 % Place 1 patch onto the skin daily. Remove & Discard patch within 12 hours or as directed by MD, Starting Thu 07/31/2016, Print     !! - Potential duplicate  medications found. Please discuss with provider.      Follow Up: [name], MD [id number] [id number] [phone]        Christopher J Tegeler, MD 08/01/16 [phone]

## 2016-07-31 NOTE — Telephone Encounter (Signed)
She had Hycodan on Feb 27th. Needs OV because I explained last time about policy. May need to go to chronic pain management.

## 2016-07-31 NOTE — Discharge Instructions (Signed)
Please schedule a follow-up appointment with her PCP and her spine team for further management of your low back pain. Please take the pain medication and use the patch to help with the discomfort. If symptoms acutely worsen, change, or he develop some of the symptoms we asked about tonight a numbness, tingling, weakness, or loss of bowel or bladder function, please return to the nearest emergency department.

## 2016-07-31 NOTE — Telephone Encounter (Signed)
Patient is calling requesting a refill on Norco 5-325mg .  States that her back is really hurting her so bad.  I show that she last requested this on 06/02/16.  Is she allowed to have a refill?    Best contact for patient is:  [phone]

## 2016-08-01 ENCOUNTER — Emergency Department (HOSPITAL_COMMUNITY)
Admission: EM | Admit: 2016-08-01 | Discharge: 2016-08-02 | Disposition: A | Discharge status: Home / Self Care | Payer: Medicare Other | Source: Home / Self Care | Attending: Emergency Medicine | Admitting: Emergency Medicine

## 2016-08-01 ENCOUNTER — Encounter (HOSPITAL_COMMUNITY): Payer: Self-pay | Admitting: Emergency Medicine

## 2016-08-01 DIAGNOSIS — I129 Hypertensive chronic kidney disease with stage 1 through stage 4 chronic kidney disease, or unspecified chronic kidney disease: Secondary | ICD-10-CM | POA: Insufficient documentation

## 2016-08-01 DIAGNOSIS — Z91040 Latex allergy status: Secondary | ICD-10-CM | POA: Insufficient documentation

## 2016-08-01 DIAGNOSIS — Z7982 Long term (current) use of aspirin: Secondary | ICD-10-CM | POA: Insufficient documentation

## 2016-08-01 DIAGNOSIS — E1122 Type 2 diabetes mellitus with diabetic chronic kidney disease: Secondary | ICD-10-CM

## 2016-08-01 DIAGNOSIS — N183 Chronic kidney disease, stage 3 (moderate): Secondary | ICD-10-CM | POA: Insufficient documentation

## 2016-08-01 DIAGNOSIS — M545 Low back pain, unspecified: Secondary | ICD-10-CM

## 2016-08-01 DIAGNOSIS — G8929 Other chronic pain: Secondary | ICD-10-CM | POA: Insufficient documentation

## 2016-08-01 DIAGNOSIS — Z79899 Other long term (current) drug therapy: Secondary | ICD-10-CM

## 2016-08-01 NOTE — ED Provider Notes (Addendum)
[facility] DEPT Provider Note   CSN: [id number] Arrival date & time: 08/01/16  2342  By signing my name below, I, Collene Leyden, attest that this documentation has been prepared under the direction and in the presence of Analese Sovine, MD. Electronically Signed: Collene Leyden, Scribe. 08/02/16. 11:59 PM.   History   Chief Complaint Chief Complaint  Patient presents with  . Back Pain   HPI Comments: [name] is a 76 y.o. female with a history of back pain s/p back surgery, HTN, RA, PE, and CKD, who presents to the Emergency Department via EMS, complaining of sudden-onset, constant lower back pain that began yesterday. Patient was see here yesterday, in which she was prescribed Vicodin. No relief in pain with prescribed narcotics. Patient is unable to ambulate. Patient descries the severity of her pain as a 10/10. Patient denies any recent traumas, weakness, bowel incontinence, or falls.   The history is provided by the patient. No language interpreter was used.  Back Pain   This is a chronic problem. The current episode started yesterday. The problem occurs constantly. The problem has not changed since onset.The pain is associated with no known injury. The pain is present in the lumbar spine. The quality of the pain is described as stabbing. The pain does not radiate. The pain is at a severity of 10/10. The pain is severe. The symptoms are aggravated by bending. The pain is the same all the time. Pertinent negatives include no chest pain, no fever, no numbness, no weight loss, no headaches, no abdominal pain, no abdominal swelling, no bowel incontinence, no perianal numbness, no bladder incontinence, no dysuria, no pelvic pain, no leg pain, no paresthesias, no paresis, no tingling and no weakness. Treatments tried: Narcotics. The treatment provided no relief. Risk factors: h/o back surgery.    Past Medical History:  Diagnosis Date  . Arthritis   . Cervical stenosis of spine   .  Dysphasia   . Essential hypertension, benign   . GERD (gastroesophageal reflux disease)   . Glaucoma   . Heart murmur   . History of estrogen therapy   . Hypercholesteremia   . IBS (irritable bowel syndrome)   . Mitral regurgitation   . Pulmonary air embolism ([place])    states she had this wiht her back surgery  . Rheumatoid arthritis ([place])   . Seborrhea    patient denies    Patient Active Problem List   Diagnosis Date Noted  . CKD (chronic kidney disease) stage 3, GFR 30-59 ml/min 10/01/2015  . Anemia 05/10/2015  . Uterine prolapse 01/08/2015  . Glaucoma 09/19/2014  . S/P shoulder replacement 02/16/2014  . Mouth ulcers 05/24/2013  . Rheumatoid arthritis ([place]) 09/23/2012  . Diabetes mellitus ([place]) 01/13/2011  . Hyperlipidemia 01/13/2011  . Hypertension 01/13/2011  . GE reflux 01/13/2011  . Glaucoma 01/13/2011  . Irritable bowel syndrome 01/13/2011  . Hx pulmonary embolism 01/13/2011    Past Surgical History:  Procedure Laterality Date  . ANTERIOR AND POSTERIOR REPAIR WITH SACROSPINOUS FIXATION N/A 01/08/2015   Procedure: ANTERIOR AND POSTERIOR REPAIR WITH possible SACROSPINOUS FIXATION;  Surgeon: Molli Posey, MD;  Location: [facility];  Service: Gynecology;  Laterality: N/A;  . CATARACT EXTRACTION Bilateral   . COLONOSCOPY    . EYE SURGERY    . LAPAROSCOPIC ASSISTED VAGINAL HYSTERECTOMY N/A 01/08/2015   Procedure: LAPAROSCOPIC ASSISTED VAGINAL HYSTERECTOMY;  Surgeon: Molli Posey, MD;  Location: [facility];  Service: Gynecology;  Laterality: N/A;  . LUMBAR FUSION  05/2010  .  REVERSE SHOULDER ARTHROPLASTY Left 02/16/2014   Procedure: LEFT REVERSE TOTAL SHOULDER ARTHROPLASTY;  Surgeon: Marin Shutter, MD;  Location: [facility];  Service: Orthopedics;  Laterality: Left;  . TOE SURGERY    . TUBAL LIGATION      OB History    No data available       Home Medications    Prior to Admission medications   Medication Sig Start Date End Date Taking? Authorizing Provider    acetaminophen (TYLENOL) 650 MG CR tablet Take 1,300 mg by mouth daily as needed for pain.     Historical Provider, MD  aspirin 81 MG tablet Take 81 mg by mouth daily.     Historical Provider, MD  Calcium Carbonate-Vit D-Min (CALCIUM 1200 PO) Take 1 capsule by mouth daily.    Historical Provider, MD  cholecalciferol (VITAMIN D) 1000 units tablet Take 1,000 Units by mouth daily.    Historical Provider, MD  cyclobenzaprine (FLEXERIL) 10 MG tablet Take 1 tablet (10 mg total) by mouth 3 (three) times daily as needed for muscle spasms. Patient not taking: Reported on 07/28/2016 05/14/16   [name], MD  HYDROcodone-acetaminophen [facility]) 5-325 MG tablet One po q 12 hours for right shoulder arthropathy Patient not taking: Reported on 07/28/2016 06/02/16   [name], MD  HYDROcodone-acetaminophen (NORCO/VICODIN) 5-325 MG tablet Take 1 tablet by mouth every 4 (four) hours as needed. 07/31/16   Gwenyth Allegra Tegeler, MD  HYDROcodone-homatropine [facility]) 5-1.5 MG/5ML syrup Take 5 mLs by mouth every 8 (eight) hours as needed for cough. 07/22/16   [name], MD  ibuprofen (ADVIL,MOTRIN) 600 MG tablet Take 1 tablet (600 mg total) by mouth every 6 (six) hours as needed. Patient not taking: Reported on 07/28/2016 01/09/15   Molli Posey, MD  Iron-FA-B Cmp-C-Biot-Probiotic (FUSION PLUS) CAPS TAKE 1 TABLET ONCE DAILY 09/26/15   Historical Provider, MD  levofloxacin (LEVAQUIN) 500 MG tablet Take 1 tablet (500 mg total) by mouth daily. Patient not taking: Reported on 07/28/2016 07/22/16   [name], MD  lidocaine (LIDODERM) 5 % Place 1 patch onto the skin daily. Remove & Discard patch within 12 hours or as directed by MD 07/31/16   Gwenyth Allegra Tegeler, MD  lisinopril (PRINIVIL,ZESTRIL) 5 MG tablet TAKE 1 TABLET BY MOUTH DAILY 09/13/15   [name], MD  mupirocin ointment (BACTROBAN) 2 % APPLY TO THE AFFECTED AREA TWICE DAILY AS NEEDED Patient not taking: Reported on 07/28/2016 02/13/16   [name], MD   omeprazole (PRILOSEC) 20 MG capsule TAKE 1 CAPSULE BY MOUTH DAILY Patient not taking: Reported on 07/31/2016 08/06/15   [name], MD  ondansetron (ZOFRAN ODT) 4 MG disintegrating tablet Take 1 tablet (4 mg total) by mouth every 8 (eight) hours as needed for nausea or vomiting. Patient not taking: Reported on 07/28/2016 05/10/16   [facility], PA-C  ondansetron (ZOFRAN ODT) 4 MG disintegrating tablet Take 1 tablet (4 mg total) by mouth every 8 (eight) hours as needed for nausea or vomiting. Patient not taking: Reported on 07/31/2016 06/21/16   Lysbeth Penner, FNP  polyethylene glycol powder (GLYCOLAX/MIRALAX) powder MIX 1 CAPFUL IN 8 OUNCES OF WATER AND DRINK ONCE DAILY AS DIRECTED Patient taking differently: MIX 1 CAPFUL IN 8 OUNCES OF WATER AND DRINK ONCE DAILY AS DIRECTED AS NEEDED FOR CONSTIPATION 04/27/16   [name], MD  simvastatin (ZOCOR) 20 MG tablet TAKE 1 TABLET BY MOUTH EVERY NIGHT AT BEDTIME 08/17/15   [facility]  Gerrianne Scale, MD  terconazole (TERAZOL 3) 0.8 % vaginal cream Place 1 applicator vaginally at bedtime. 09/03/15   [name], MD  triamcinolone cream (KENALOG) 0.1 % APPLY EXTERNALLY TO THE AFFECTED AREA THREE TIMES DAILY Patient not taking: Reported on 07/28/2016 06/17/15   [name], MD  triamterene-hydrochlorothiazide (MAXZIDE) 75-50 MG tablet TAKE 1 TABLET BY MOUTH EVERY DAY 07/02/16   [name], MD  vitamin C (ASCORBIC ACID) 500 MG tablet Take 500 mg by mouth daily.    Historical Provider, MD    Family History Family History  Problem Relation Age of Onset  . Healthy Daughter     Social History Social History  Substance Use Topics  . Smoking status: Never Smoker  . Smokeless tobacco: Never Used  . Alcohol use No     Allergies   Amoxicillin-pot clavulanate; Latex; and Tramadol   Review of Systems Review of Systems  Constitutional: Negative for fever and weight loss.  Cardiovascular: Negative for chest pain.  Gastrointestinal: Negative for abdominal  pain and bowel incontinence.  Genitourinary: Negative for bladder incontinence, difficulty urinating, dysuria and pelvic pain.  Musculoskeletal: Positive for back pain (lower). Negative for gait problem.  Neurological: Negative for tingling, weakness, numbness, headaches and paresthesias.  All other systems reviewed and are negative.    Physical Exam Updated Vital Signs BP 128/59 (BP Location: Left Arm)   Pulse 72   Temp 98.6 F (37 C) (Oral)   Resp 16   SpO2 100%   Physical Exam  Constitutional: She is oriented to person, place, and time. She appears well-developed.  HENT:  Head: Normocephalic and atraumatic.  Mouth/Throat: Oropharynx is clear and moist.  Eyes: Conjunctivae and EOM are normal. Pupils are equal, round, and reactive to light.  Neck: Normal range of motion. Neck supple.  Cardiovascular: Normal rate and regular rhythm.  Exam reveals no gallop and no friction rub.   No murmur heard. Pulmonary/Chest: Effort normal and breath sounds normal. No stridor. No respiratory distress. She has no wheezes. She has no rales.  Abdominal: Soft. Bowel sounds are normal. There is no rebound and no guarding.  Musculoskeletal: Normal range of motion.  Neurological: She is alert and oriented to person, place, and time. She displays normal reflexes. She exhibits normal muscle tone.  Skin: Skin is warm and dry. Capillary refill takes less than 2 seconds.  Psychiatric: She has a normal mood and affect.     ED Treatments / Results   Vitals:   08/01/16 2350  BP: 128/59  Pulse: 72  Resp: 16  Temp: 98.6 F (37 C)    DIAGNOSTIC STUDIES: Oxygen Saturation is 100% on RA, normal by my interpretation.    COORDINATION OF CARE: 11:59 PM Discussed treatment plan with pt at bedside and pt agreed to plan, which include imaging.   Radiology    Results for orders placed or performed during the hospital encounter of 07/30/16  Surgical pcr screen  Result Value Ref Range   MRSA, PCR  NEGATIVE NEGATIVE   Staphylococcus aureus NEGATIVE NEGATIVE  Basic metabolic panel  Result Value Ref Range   Sodium 137 135 - 145 mmol/L   Potassium 4.2 3.5 - 5.1 mmol/L   Chloride 104 101 - 111 mmol/L   CO2 23 22 - 32 mmol/L   Glucose, Bld 79 65 - 99 mg/dL   BUN 34 (H) 6 - 20 mg/dL   Creatinine, Ser 1.32 (H) 0.44 - 1.00 mg/dL   Calcium 9.2 8.9 - 10.3  mg/dL   GFR calc non Af Amer 38 (L) >60 mL/min   GFR calc Af Amer 44 (L) >60 mL/min   Anion gap 10 5 - 15  CBC  Result Value Ref Range   WBC 10.9 (H) 4.0 - 10.5 K/uL   RBC 3.20 (L) 3.87 - 5.11 MIL/uL   Hemoglobin 10.0 (L) 12.0 - 15.0 g/dL   HCT 30.7 (L) 36.0 - 46.0 %   MCV 95.9 78.0 - 100.0 fL   MCH 31.3 26.0 - 34.0 pg   MCHC 32.6 30.0 - 36.0 g/dL   RDW 14.5 11.5 - 15.5 %   Platelets 506 (H) 150 - 400 K/uL   Dg Lumbar Spine Complete  Result Date: 08/02/2016 CLINICAL DATA:  Low back pain without known injury x1 week. History of back surgery in 2012. EXAM: LUMBAR SPINE - COMPLETE 4+ VIEW COMPARISON:  07/01/2011 lumbar spine radiographs FINDINGS: Transitional lumbosacral anatomy with the lowest square vertebral body labeled S1. Numbering system as per prior. Intact fusion hardware spanning L4 through S1 with interbody blocks in place, at L4-5 and L5-S1. Progression of posterior disc space narrowing at L3-4. Stable mild L2-3 disc space narrowing. No acute fracture, hardware failure nor bone destruction. Aortoiliac atherosclerosis. IMPRESSION: 1. Progression of degenerative disc disease at L3-4 since prior exam. 2. Intact L4 through S1 lumbar fusion. 3. Transitional lumbosacral anatomy. Electronically Signed   By: Ashley Royalty M.D.   On: 08/02/2016 00:55    Procedures Procedures (including critical care time)  Medications Ordered in ED  Medications  ketorolac (TORADOL) injection 60 mg (60 mg Intramuscular Given 08/02/16 0037)  dexamethasone (DECADRON) injection 10 mg (10 mg Intramuscular Given 08/02/16 0151)     Final Clinical  Impressions(s) / ED Diagnoses  Chronic back pain:  Follow up with your surgeon for ongoing care. I have reviewed the triage vital signs and the nursing notes. Patient has stable vitals.   Pertinent labs were available during my care of the patient were reviewed by me and considered in my medical decision making.  After history, exam, and medical workup I feel the patient has been appropriately medically screened and is safe for discharge home. Pertinent diagnoses were discussed with the patient. Patient was given return precautions. Return immediately for fevers, continued weakness numbness inability to walk or pass urine or leakage of stool  or any concerns. Follow up with your PMD for recheck in 2 days.    I personally performed the services described in this documentation, which was scribed in my presence. The recorded information has been reviewed and is accurate.      Veatrice Kells, MD 08/02/16 7824    Mark Benecke, MD 08/02/16 [phone]

## 2016-08-01 NOTE — Telephone Encounter (Signed)
Spoke with patient and made follow up appointment for Monday, 3/12 @ 12:00.   Patient states that her lower back is causing her terrible pain.  As long as she lays still, everything is fine.  If she moves or coughs, she has terrible, shooting pain go all the way across her lower back.  She is also having spasms.   As of today, she is still scheduled for Left Shoulder surgery next Wednesday, 3/14 with Dr. Onnie Graham.

## 2016-08-01 NOTE — ED Triage Notes (Signed)
Per EMS: Pt from home. 10/10 back pain. Pt seen here last night. Pt states she was unable to ambulate. VSS.   Pt neuro status is intact. Pt rating 10/10 pain in lower back during triage. Pt states she is as comfortable as she can get

## 2016-08-02 ENCOUNTER — Emergency Department (HOSPITAL_COMMUNITY): Payer: Medicare Other

## 2016-08-02 ENCOUNTER — Encounter (HOSPITAL_COMMUNITY): Payer: Self-pay | Admitting: Emergency Medicine

## 2016-08-02 DIAGNOSIS — M545 Low back pain: Secondary | ICD-10-CM | POA: Diagnosis not present

## 2016-08-02 MED ORDER — KETOROLAC TROMETHAMINE 60 MG/2ML IM SOLN
60.0000 mg | Freq: Once | INTRAMUSCULAR | Status: AC
  Administered 2016-08-02: 60 mg via INTRAMUSCULAR
  Filled 2016-08-02: qty 2

## 2016-08-02 MED ORDER — DEXAMETHASONE SODIUM PHOSPHATE 10 MG/ML IJ SOLN
10.0000 mg | Freq: Once | INTRAMUSCULAR | Status: AC
  Administered 2016-08-02: 10 mg via INTRAMUSCULAR
  Filled 2016-08-02: qty 1

## 2016-08-02 MED ORDER — DICLOFENAC SODIUM 1 % TD GEL: 4.0000 g | Freq: Four times a day (QID) | TRANSDERMAL | 0 refills | Status: DC

## 2016-08-02 NOTE — ED Notes (Signed)
Pt to xray

## 2016-08-02 NOTE — ED Notes (Signed)
Pt verbalized understanding of d/c instructions and has no further questions. Pt is stable, A&Ox4, VSS.  

## 2016-08-04 ENCOUNTER — Ambulatory Visit (INDEPENDENT_AMBULATORY_CARE_PROVIDER_SITE_OTHER): Payer: Medicare Other | Admitting: Internal Medicine

## 2016-08-04 ENCOUNTER — Encounter: Payer: Self-pay | Admitting: Internal Medicine

## 2016-08-04 VITALS — BP 120/70 | HR 64 | Temp 97.0°F | Ht 62.0 in | Wt 156.0 lb

## 2016-08-04 DIAGNOSIS — M19011 Primary osteoarthritis, right shoulder: Secondary | ICD-10-CM | POA: Diagnosis not present

## 2016-08-04 DIAGNOSIS — M545 Low back pain: Secondary | ICD-10-CM

## 2016-08-04 DIAGNOSIS — M05711 Rheumatoid arthritis with rheumatoid factor of right shoulder without organ or systems involvement: Secondary | ICD-10-CM | POA: Diagnosis not present

## 2016-08-04 MED ORDER — METHYLPREDNISOLONE ACETATE 80 MG/ML IJ SUSP
80.0000 mg | Freq: Once | INTRAMUSCULAR | Status: AC
  Administered 2016-08-04: 80 mg via INTRAMUSCULAR

## 2016-08-04 NOTE — Progress Notes (Signed)
   Subjective:    Patient ID: [name], female    DOB: [birth year], 76 y.o.   MRN: [id number]  HPI  76 year old Black Female scheduled for shoulder arthroplasty this week on March 15 by Dr. Onnie Graham went to ED March 8 and again March 9 via ambulance each time with severe low back pain. Not aware of any acute injury. Aggravated by cough and movement.  History of Rheumatoid arthritis. No recent issues with severe low back pain.   Hx of spinal stenosis with spondylolisthesis s/p posterior lumbar interbody fusion L4-L5 and L5-S1 by Dr. Sherley Bounds in 2012.  LS spine film in ED showed progression of L3-L4 disease and intact L4- S1 lumbar fusion without hardware failure or destruction.  On March 8 treated with Rx for Norco 5/325, Flexeril, and Lidoderm patch Plus fentanyl injection x 2 given in ED  On March 9 given Toradil injection without relief. Then subsequently given Decadron 10 mg IM with relief.  Now here for follow up.Does not want narcotic pain medication. Says it causes constipation. Tramadol causes nausea and vomiting. Still has some 8 tablets of Norco 5/325 left.       Review of Systems says her back is still uncomfortable but she is fairly comfortable in the office today sitting in chair.     Objective:   Physical Exam Straight leg raising is slightly positive on the right negative on the left. Muscle strength is 5 over 5 in the lower extremities. Deep tendon reflexes 2+ and symmetrical in the knees. Tender over right posterior superior iliac spine       Assessment & Plan:  History of spinal stenosis status post lumbar fusion L4-L5 and L5-S1 in 2012 by Dr. Sherley Bounds  LS-spine films indicate progression of disease L3-L4  History of rheumatoid arthritis  Shoulder arthropathy scheduled for arthroplasty March 15 by Dr. supple  Plan: We do not carry Decadron. Gave her Depo-Medrol 80 mg IM today. She wants to proceed with surgery on her shoulder.

## 2016-08-04 NOTE — Patient Instructions (Signed)
Depo-Medrol 80 mg IM. Has Norco if needed for severe back pain. Will fax records to Dr. supple so he is aware of her back condition.

## 2016-08-06 MED ORDER — CEFAZOLIN SODIUM-DEXTROSE 2-4 GM/100ML-% IV SOLN
2.0000 g | INTRAVENOUS | Status: AC
  Administered 2016-08-07: 2 g via INTRAVENOUS
  Filled 2016-08-06: qty 100

## 2016-08-06 MED ORDER — CEFAZOLIN SODIUM-DEXTROSE 2-4 GM/100ML-% IV SOLN: 2.0000 g | INTRAVENOUS | Status: DC

## 2016-08-06 MED ORDER — TRANEXAMIC ACID 1000 MG/10ML IV SOLN
1000.0000 mg | INTRAVENOUS | Status: AC
  Administered 2016-08-07: 1000 mg via INTRAVENOUS
  Filled 2016-08-06: qty 10

## 2016-08-06 MED ORDER — TRANEXAMIC ACID 1000 MG/10ML IV SOLN
1000.0000 mg | INTRAVENOUS | Status: DC
  Filled 2016-08-06: qty 10

## 2016-08-06 NOTE — Anesthesia Preprocedure Evaluation (Addendum)
Anesthesia Evaluation  Patient identified by MRN, date of birth, ID band Patient awake    Reviewed: Allergy & Precautions, H&P , NPO status , Patient's Chart, lab work & pertinent test results  Airway Mallampati: II  TM Distance: >3 FB Neck ROM: Full    Dental no notable dental hx. (+) Teeth Intact, Dental Advisory Given   Pulmonary neg pulmonary ROS,    Pulmonary exam normal breath sounds clear to auscultation       Cardiovascular Exercise Tolerance: Good hypertension, Pt. on medications  Rhythm:Regular Rate:Normal     Neuro/Psych negative neurological ROS  negative psych ROS   GI/Hepatic Neg liver ROS, GERD  Medicated and Controlled,  Endo/Other  negative endocrine ROS  Renal/GU Renal InsufficiencyRenal disease  negative genitourinary   Musculoskeletal  (+) Arthritis , Rheumatoid disorders,    Abdominal   Peds  Hematology negative hematology ROS (+)   Anesthesia Other Findings   Reproductive/Obstetrics negative OB ROS                            Anesthesia Physical Anesthesia Plan  ASA: II  Anesthesia Plan: General   Post-op Pain Management:  Regional for Post-op pain   Induction: Intravenous  Airway Management Planned: Oral ETT  Additional Equipment:   Intra-op Plan:   Post-operative Plan: Extubation in OR  Informed Consent: I have reviewed the patients History and Physical, chart, labs and discussed the procedure including the risks, benefits and alternatives for the proposed anesthesia with the patient or authorized representative who has indicated his/her understanding and acceptance.   Dental advisory given  Plan Discussed with: CRNA  Anesthesia Plan Comments:         Anesthesia Quick Evaluation

## 2016-08-07 ENCOUNTER — Inpatient Hospital Stay (HOSPITAL_COMMUNITY)
Admission: RE | Admit: 2016-08-07 | Discharge: 2016-08-09 | DRG: 483 | Disposition: A | Discharge status: Home / Self Care | Payer: Medicare Other | Source: Ambulatory Visit | Attending: Orthopedic Surgery | Admitting: Orthopedic Surgery

## 2016-08-07 ENCOUNTER — Inpatient Hospital Stay (HOSPITAL_COMMUNITY): Payer: Medicare Other | Admitting: Certified Registered Nurse Anesthetist

## 2016-08-07 ENCOUNTER — Encounter (HOSPITAL_COMMUNITY): Payer: Self-pay | Admitting: *Deleted

## 2016-08-07 ENCOUNTER — Encounter (HOSPITAL_COMMUNITY)
Admission: RE | Disposition: A | Discharge status: Home / Self Care | Payer: Self-pay | Source: Ambulatory Visit | Attending: Orthopedic Surgery

## 2016-08-07 DIAGNOSIS — M069 Rheumatoid arthritis, unspecified: Secondary | ICD-10-CM | POA: Diagnosis not present

## 2016-08-07 DIAGNOSIS — M75101 Unspecified rotator cuff tear or rupture of right shoulder, not specified as traumatic: Principal | ICD-10-CM | POA: Diagnosis present

## 2016-08-07 DIAGNOSIS — D62 Acute posthemorrhagic anemia: Secondary | ICD-10-CM | POA: Diagnosis not present

## 2016-08-07 DIAGNOSIS — G8918 Other acute postprocedural pain: Secondary | ICD-10-CM | POA: Diagnosis not present

## 2016-08-07 DIAGNOSIS — M12811 Other specific arthropathies, not elsewhere classified, right shoulder: Secondary | ICD-10-CM | POA: Diagnosis not present

## 2016-08-07 DIAGNOSIS — E7800 Pure hypercholesterolemia, unspecified: Secondary | ICD-10-CM | POA: Diagnosis present

## 2016-08-07 DIAGNOSIS — Z9223 Personal history of estrogen therapy: Secondary | ICD-10-CM | POA: Diagnosis not present

## 2016-08-07 DIAGNOSIS — I10 Essential (primary) hypertension: Secondary | ICD-10-CM | POA: Diagnosis not present

## 2016-08-07 DIAGNOSIS — Z96611 Presence of right artificial shoulder joint: Secondary | ICD-10-CM

## 2016-08-07 DIAGNOSIS — N183 Chronic kidney disease, stage 3 (moderate): Secondary | ICD-10-CM | POA: Diagnosis not present

## 2016-08-07 DIAGNOSIS — K219 Gastro-esophageal reflux disease without esophagitis: Secondary | ICD-10-CM | POA: Diagnosis not present

## 2016-08-07 DIAGNOSIS — D539 Nutritional anemia, unspecified: Secondary | ICD-10-CM | POA: Diagnosis not present

## 2016-08-07 DIAGNOSIS — I129 Hypertensive chronic kidney disease with stage 1 through stage 4 chronic kidney disease, or unspecified chronic kidney disease: Secondary | ICD-10-CM | POA: Diagnosis not present

## 2016-08-07 HISTORY — PX: REVERSE SHOULDER ARTHROPLASTY: SHX5054

## 2016-08-07 SURGERY — ARTHROPLASTY, SHOULDER, TOTAL, REVERSE
Anesthesia: General | Laterality: Right

## 2016-08-07 MED ORDER — LISINOPRIL 5 MG PO TABS
5.0000 mg | ORAL_TABLET | Freq: Every day | ORAL | Status: DC
  Administered 2016-08-08 – 2016-08-09 (×2): 5 mg via ORAL
  Filled 2016-08-07 (×2): qty 1

## 2016-08-07 MED ORDER — KETOROLAC TROMETHAMINE 15 MG/ML IJ SOLN
INTRAMUSCULAR | Status: AC
  Filled 2016-08-07: qty 1

## 2016-08-07 MED ORDER — LACTATED RINGERS IV SOLN
INTRAVENOUS | Status: DC | PRN
  Administered 2016-08-07 (×2): via INTRAVENOUS

## 2016-08-07 MED ORDER — LIDOCAINE 2% (20 MG/ML) 5 ML SYRINGE
INTRAMUSCULAR | Status: DC | PRN
  Administered 2016-08-07: 40 mg via INTRAVENOUS

## 2016-08-07 MED ORDER — TRANEXAMIC ACID 1000 MG/10ML IV SOLN
2000.0000 mg | INTRAVENOUS | Status: AC
  Administered 2016-08-07: 2000 mg via TOPICAL
  Filled 2016-08-07: qty 20

## 2016-08-07 MED ORDER — ROCURONIUM BROMIDE 10 MG/ML (PF) SYRINGE
PREFILLED_SYRINGE | INTRAVENOUS | Status: DC | PRN
  Administered 2016-08-07: 50 mg via INTRAVENOUS

## 2016-08-07 MED ORDER — SUGAMMADEX SODIUM 200 MG/2ML IV SOLN
INTRAVENOUS | Status: DC | PRN
  Administered 2016-08-07: 200 mg via INTRAVENOUS

## 2016-08-07 MED ORDER — FENTANYL CITRATE (PF) 100 MCG/2ML IJ SOLN
INTRAMUSCULAR | Status: DC | PRN
  Administered 2016-08-07: 50 ug via INTRAVENOUS

## 2016-08-07 MED ORDER — ONDANSETRON HCL 4 MG/2ML IJ SOLN
4.0000 mg | Freq: Four times a day (QID) | INTRAMUSCULAR | Status: DC | PRN
  Administered 2016-08-07: 4 mg via INTRAVENOUS
  Filled 2016-08-07: qty 2

## 2016-08-07 MED ORDER — DIAZEPAM 5 MG PO TABS: 2.5000 mg | ORAL_TABLET | Freq: Four times a day (QID) | ORAL | Status: DC | PRN

## 2016-08-07 MED ORDER — ONDANSETRON HCL 4 MG/2ML IJ SOLN
INTRAMUSCULAR | Status: AC
  Filled 2016-08-07: qty 2

## 2016-08-07 MED ORDER — ACETAMINOPHEN 325 MG PO TABS: 650.0000 mg | ORAL_TABLET | Freq: Four times a day (QID) | ORAL | Status: DC | PRN

## 2016-08-07 MED ORDER — POLYETHYLENE GLYCOL 3350 17 G PO PACK: 17.0000 g | PACK | Freq: Every day | ORAL | Status: DC | PRN

## 2016-08-07 MED ORDER — MENTHOL 3 MG MT LOZG
1.0000 | LOZENGE | OROMUCOSAL | Status: DC | PRN
  Administered 2016-08-09: 3 mg via ORAL
  Filled 2016-08-07: qty 9

## 2016-08-07 MED ORDER — PANTOPRAZOLE SODIUM 40 MG PO TBEC: 40.0000 mg | DELAYED_RELEASE_TABLET | Freq: Every day | ORAL | Status: DC

## 2016-08-07 MED ORDER — ENSURE ENLIVE PO LIQD
237.0000 mL | Freq: Two times a day (BID) | ORAL | Status: DC
  Administered 2016-08-08: 237 mL via ORAL

## 2016-08-07 MED ORDER — ONDANSETRON HCL 4 MG PO TABS
4.0000 mg | ORAL_TABLET | Freq: Four times a day (QID) | ORAL | Status: DC | PRN
  Administered 2016-08-07: 4 mg via ORAL
  Filled 2016-08-07: qty 1

## 2016-08-07 MED ORDER — EPHEDRINE SULFATE-NACL 50-0.9 MG/10ML-% IV SOSY
PREFILLED_SYRINGE | INTRAVENOUS | Status: DC | PRN
  Administered 2016-08-07: 5 mg via INTRAVENOUS
  Administered 2016-08-07: 10 mg via INTRAVENOUS

## 2016-08-07 MED ORDER — SUGAMMADEX SODIUM 200 MG/2ML IV SOLN
INTRAVENOUS | Status: AC
  Filled 2016-08-07: qty 2

## 2016-08-07 MED ORDER — 0.9 % SODIUM CHLORIDE (POUR BTL) OPTIME
TOPICAL | Status: DC | PRN
  Administered 2016-08-07: 1000 mL

## 2016-08-07 MED ORDER — ASPIRIN EC 81 MG PO TBEC
81.0000 mg | DELAYED_RELEASE_TABLET | Freq: Every day | ORAL | Status: DC
  Administered 2016-08-07 – 2016-08-09 (×3): 81 mg via ORAL
  Filled 2016-08-07 (×3): qty 1

## 2016-08-07 MED ORDER — PHENYLEPHRINE 40 MCG/ML (10ML) SYRINGE FOR IV PUSH (FOR BLOOD PRESSURE SUPPORT)
PREFILLED_SYRINGE | INTRAVENOUS | Status: AC
  Filled 2016-08-07: qty 10

## 2016-08-07 MED ORDER — ONDANSETRON HCL 4 MG/2ML IJ SOLN
INTRAMUSCULAR | Status: DC | PRN
  Administered 2016-08-07: 4 mg via INTRAVENOUS

## 2016-08-07 MED ORDER — FENTANYL CITRATE (PF) 100 MCG/2ML IJ SOLN
25.0000 ug | INTRAMUSCULAR | Status: DC | PRN
  Administered 2016-08-07: 50 ug via INTRAVENOUS

## 2016-08-07 MED ORDER — OXYCODONE HCL 5 MG PO TABS
5.0000 mg | ORAL_TABLET | ORAL | Status: DC | PRN
  Administered 2016-08-07: 10 mg via ORAL
  Administered 2016-08-07 – 2016-08-08 (×2): 5 mg via ORAL
  Filled 2016-08-07 (×2): qty 1

## 2016-08-07 MED ORDER — BUPIVACAINE-EPINEPHRINE (PF) 0.5% -1:200000 IJ SOLN
INTRAMUSCULAR | Status: DC | PRN
  Administered 2016-08-07: 30 mL via PERINEURAL

## 2016-08-07 MED ORDER — PHENYLEPHRINE HCL 10 MG/ML IJ SOLN
INTRAVENOUS | Status: DC | PRN
  Administered 2016-08-07: 40 ug/min via INTRAVENOUS

## 2016-08-07 MED ORDER — FENTANYL CITRATE (PF) 100 MCG/2ML IJ SOLN
INTRAMUSCULAR | Status: AC
  Filled 2016-08-07: qty 2

## 2016-08-07 MED ORDER — METOCLOPRAMIDE HCL 5 MG/ML IJ SOLN: 5.0000 mg | Freq: Three times a day (TID) | INTRAMUSCULAR | Status: DC | PRN

## 2016-08-07 MED ORDER — CEFAZOLIN SODIUM 1 G IJ SOLR
INTRAMUSCULAR | Status: AC
  Filled 2016-08-07: qty 20

## 2016-08-07 MED ORDER — PROPOFOL 10 MG/ML IV BOLUS
INTRAVENOUS | Status: AC
  Filled 2016-08-07: qty 20

## 2016-08-07 MED ORDER — PHENOL 1.4 % MT LIQD
1.0000 | OROMUCOSAL | Status: DC | PRN
Start: 2016-08-07 — End: 2016-08-09

## 2016-08-07 MED ORDER — ACETAMINOPHEN 650 MG RE SUPP: 650.0000 mg | Freq: Four times a day (QID) | RECTAL | Status: DC | PRN

## 2016-08-07 MED ORDER — CEFAZOLIN SODIUM-DEXTROSE 2-4 GM/100ML-% IV SOLN
2.0000 g | Freq: Four times a day (QID) | INTRAVENOUS | Status: AC
  Administered 2016-08-07 – 2016-08-08 (×3): 2 g via INTRAVENOUS
  Filled 2016-08-07 (×3): qty 100

## 2016-08-07 MED ORDER — TRIAMTERENE-HCTZ 75-50 MG PO TABS
1.0000 | ORAL_TABLET | Freq: Every day | ORAL | Status: DC
  Administered 2016-08-08 – 2016-08-09 (×2): 1 via ORAL
  Filled 2016-08-07 (×3): qty 1

## 2016-08-07 MED ORDER — ALBUMIN HUMAN 5 % IV SOLN
INTRAVENOUS | Status: DC | PRN
  Administered 2016-08-07: 09:00:00 via INTRAVENOUS

## 2016-08-07 MED ORDER — DOCUSATE SODIUM 100 MG PO CAPS
100.0000 mg | ORAL_CAPSULE | Freq: Two times a day (BID) | ORAL | Status: DC
  Administered 2016-08-07 – 2016-08-09 (×4): 100 mg via ORAL
  Filled 2016-08-07 (×4): qty 1

## 2016-08-07 MED ORDER — OXYCODONE HCL 5 MG PO TABS
ORAL_TABLET | ORAL | Status: AC
  Filled 2016-08-07: qty 2

## 2016-08-07 MED ORDER — EPHEDRINE 5 MG/ML INJ
INTRAVENOUS | Status: AC
  Filled 2016-08-07: qty 10

## 2016-08-07 MED ORDER — MIDAZOLAM HCL 2 MG/2ML IJ SOLN
INTRAMUSCULAR | Status: AC
  Filled 2016-08-07: qty 2

## 2016-08-07 MED ORDER — MAGNESIUM CITRATE PO SOLN: 1.0000 | Freq: Once | ORAL | Status: DC | PRN

## 2016-08-07 MED ORDER — LACTATED RINGERS IV SOLN
INTRAVENOUS | Status: DC
  Administered 2016-08-08 (×2): via INTRAVENOUS

## 2016-08-07 MED ORDER — BISACODYL 5 MG PO TBEC: 5.0000 mg | DELAYED_RELEASE_TABLET | Freq: Every day | ORAL | Status: DC | PRN

## 2016-08-07 MED ORDER — SIMVASTATIN 20 MG PO TABS
20.0000 mg | ORAL_TABLET | Freq: Every day | ORAL | Status: DC
  Administered 2016-08-07 – 2016-08-08 (×2): 20 mg via ORAL
  Filled 2016-08-07 (×2): qty 1

## 2016-08-07 MED ORDER — PROPOFOL 10 MG/ML IV BOLUS
INTRAVENOUS | Status: DC | PRN
  Administered 2016-08-07: 20 mg via INTRAVENOUS
  Administered 2016-08-07: 30 mg via INTRAVENOUS
  Administered 2016-08-07: 150 mg via INTRAVENOUS

## 2016-08-07 MED ORDER — DEXAMETHASONE SODIUM PHOSPHATE 10 MG/ML IJ SOLN
INTRAMUSCULAR | Status: DC | PRN
  Administered 2016-08-07: 4 mg via INTRAVENOUS

## 2016-08-07 MED ORDER — CHLORHEXIDINE GLUCONATE 4 % EX LIQD: 60.0000 mL | Freq: Once | CUTANEOUS | Status: DC

## 2016-08-07 MED ORDER — ALUM & MAG HYDROXIDE-SIMETH 200-200-20 MG/5ML PO SUSP: 30.0000 mL | ORAL | Status: DC | PRN

## 2016-08-07 MED ORDER — KETOROLAC TROMETHAMINE 15 MG/ML IJ SOLN
7.5000 mg | Freq: Four times a day (QID) | INTRAMUSCULAR | Status: AC
  Administered 2016-08-07 – 2016-08-08 (×4): 7.5 mg via INTRAVENOUS
  Filled 2016-08-07 (×7): qty 1

## 2016-08-07 MED ORDER — DEXAMETHASONE SODIUM PHOSPHATE 10 MG/ML IJ SOLN
INTRAMUSCULAR | Status: AC
  Filled 2016-08-07: qty 1

## 2016-08-07 MED ORDER — HYDROMORPHONE HCL 2 MG/ML IJ SOLN: 1.0000 mg | INTRAMUSCULAR | Status: DC | PRN

## 2016-08-07 MED ORDER — METOCLOPRAMIDE HCL 5 MG PO TABS: 5.0000 mg | ORAL_TABLET | Freq: Three times a day (TID) | ORAL | Status: DC | PRN

## 2016-08-07 SURGICAL SUPPLY — 65 items
ADH SKN CLS APL DERMABOND .7 (GAUZE/BANDAGES/DRESSINGS) ×1
AID PSTN UNV HD RSTRNT DISP (MISCELLANEOUS) ×1
BASEPLATE GLENOID SHLDR SM (Shoulder) ×1 IMPLANT
BLADE SAW SGTL 83.5X18.5 (BLADE) ×2 IMPLANT
BSPLAT GLND SM PRFT SHLDR CA (Shoulder) ×1 IMPLANT
COVER SURGICAL LIGHT HANDLE (MISCELLANEOUS) ×2 IMPLANT
CUP SUT UNIV REVERS 36+2 RT (Cup) ×1 IMPLANT
DERMABOND ADVANCED (GAUZE/BANDAGES/DRESSINGS) ×1
DERMABOND ADVANCED .7 DNX12 (GAUZE/BANDAGES/DRESSINGS) ×1 IMPLANT
DRAPE ORTHO SPLIT 77X108 STRL (DRAPES) ×4
DRAPE SURG 17X11 SM STRL (DRAPES) ×2 IMPLANT
DRAPE SURG ORHT 6 SPLT 77X108 (DRAPES) ×2 IMPLANT
DRAPE U-SHAPE 47X51 STRL (DRAPES) ×2 IMPLANT
DRSG AQUACEL AG ADV 3.5X10 (GAUZE/BANDAGES/DRESSINGS) ×2 IMPLANT
DURAPREP 26ML APPLICATOR (WOUND CARE) ×2 IMPLANT
ELECT BLADE 4.0 EZ CLEAN MEGAD (MISCELLANEOUS) ×2
ELECT CAUTERY BLADE 6.4 (BLADE) ×2 IMPLANT
ELECT REM PT RETURN 9FT ADLT (ELECTROSURGICAL) ×2
ELECTRODE BLDE 4.0 EZ CLN MEGD (MISCELLANEOUS) ×1 IMPLANT
ELECTRODE REM PT RTRN 9FT ADLT (ELECTROSURGICAL) ×1 IMPLANT
FACESHIELD WRAPAROUND (MASK) ×6 IMPLANT
FACESHIELD WRAPAROUND OR TEAM (MASK) ×3 IMPLANT
GLENOSPHERE LATERAL 36MM+4 (Shoulder) ×1 IMPLANT
GLOVE BIO SURGEON STRL SZ7.5 (GLOVE) ×2 IMPLANT
GLOVE BIO SURGEON STRL SZ8 (GLOVE) ×2 IMPLANT
GLOVE EUDERMIC 7 POWDERFREE (GLOVE) ×2 IMPLANT
GLOVE SS BIOGEL STRL SZ 7.5 (GLOVE) ×1 IMPLANT
GLOVE SUPERSENSE BIOGEL SZ 7.5 (GLOVE) ×1
GOWN STRL REUS W/ TWL LRG LVL3 (GOWN DISPOSABLE) IMPLANT
GOWN STRL REUS W/ TWL XL LVL3 (GOWN DISPOSABLE) ×2 IMPLANT
GOWN STRL REUS W/TWL LRG LVL3 (GOWN DISPOSABLE)
GOWN STRL REUS W/TWL XL LVL3 (GOWN DISPOSABLE) ×4
KIT BASIN OR (CUSTOM PROCEDURE TRAY) ×2 IMPLANT
KIT ROOM TURNOVER OR (KITS) ×2 IMPLANT
LINER HUMERAL 36 +3MM SM (Shoulder) ×1 IMPLANT
MANIFOLD NEPTUNE II (INSTRUMENTS) ×2 IMPLANT
NDL HYPO 25GX1X1/2 BEV (NEEDLE) IMPLANT
NDL TAPERED W/ NITINOL LOOP (MISCELLANEOUS) ×1 IMPLANT
NEEDLE HYPO 25GX1X1/2 BEV (NEEDLE) IMPLANT
NEEDLE TAPERED W/ NITINOL LOOP (MISCELLANEOUS) ×2 IMPLANT
NS IRRIG 1000ML POUR BTL (IV SOLUTION) ×2 IMPLANT
PACK SHOULDER (CUSTOM PROCEDURE TRAY) ×2 IMPLANT
PAD ARMBOARD 7.5X6 YLW CONV (MISCELLANEOUS) ×4 IMPLANT
RESTRAINT HEAD UNIVERSAL NS (MISCELLANEOUS) ×2 IMPLANT
SCREW LOCK GLENOID UNI PERI (Screw) ×1 IMPLANT
SCREW LOCK PERIPHERAL 30MM (Shoulder) ×1 IMPLANT
SCREW NON LOCK 6.5X15 (Shoulder) ×1 IMPLANT
SET PIN UNIVERSAL REVERSE (SET/KITS/TRAYS/PACK) ×1 IMPLANT
SLING ARM FOAM STRAP LRG (SOFTGOODS) IMPLANT
SPACER SHLD UNI REV 36 +6 (Shoulder) ×1 IMPLANT
SPONGE LAP 18X18 X RAY DECT (DISPOSABLE) ×2 IMPLANT
SPONGE LAP 4X18 X RAY DECT (DISPOSABLE) ×2 IMPLANT
STEM HUMERAL MOD SZ 5 135 DEG (Stem) ×1 IMPLANT
SUCTION FRAZIER HANDLE 10FR (MISCELLANEOUS) ×1
SUCTION TUBE FRAZIER 10FR DISP (MISCELLANEOUS) IMPLANT
SUT FIBERWIRE #2 38 T-5 BLUE (SUTURE) ×8
SUT MNCRL AB 3-0 PS2 18 (SUTURE) ×2 IMPLANT
SUT MON AB 2-0 CT1 27 (SUTURE) ×2 IMPLANT
SUT VIC AB 1 CT1 27 (SUTURE) ×2
SUT VIC AB 1 CT1 27XBRD ANBCTR (SUTURE) ×1 IMPLANT
SUTURE FIBERWR #2 38 T-5 BLUE (SUTURE) ×2 IMPLANT
SYR CONTROL 10ML LL (SYRINGE) IMPLANT
TOWEL OR 17X24 6PK STRL BLUE (TOWEL DISPOSABLE) ×1 IMPLANT
TOWEL OR 17X26 10 PK STRL BLUE (TOWEL DISPOSABLE) ×1 IMPLANT
WATER STERILE IRR 1000ML POUR (IV SOLUTION) ×1 IMPLANT

## 2016-08-07 NOTE — Anesthesia Postprocedure Evaluation (Signed)
Anesthesia Post Note  Patient: [name]  Procedure(s) Performed: Procedure(s) (LRB): REVERSE RIGHT SHOULDER ARTHROPLASTY (Right)  Patient location during evaluation: PACU Anesthesia Type: General and Regional Level of consciousness: awake and alert Pain management: pain level controlled Vital Signs Assessment: post-procedure vital signs reviewed and stable Respiratory status: spontaneous breathing, nonlabored ventilation, respiratory function stable and patient connected to nasal cannula oxygen Cardiovascular status: blood pressure returned to baseline and stable Postop Assessment: no signs of nausea or vomiting Anesthetic complications: no       Last Vitals:  Vitals:   08/07/16 1030 08/07/16 1045  BP: (!) 109/54 (!) 112/55  Pulse: 60 63  Resp: 18 14  Temp:  36.2 C    Last Pain:  Vitals:   08/07/16 1030  TempSrc:   PainSc: Asleep                 Sabastian Raimondi,W. EDMOND

## 2016-08-07 NOTE — H&P (Signed)
[name]    Chief Complaint: Right shoulder rotator cuff tear arthropathy HPI: The patient is a 76 y.o. 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 Arthritis   . Cervical stenosis of spine   . Dysphasia   . Essential hypertension, benign   . GERD (gastroesophageal reflux disease)   . Glaucoma   . Heart murmur   . History of estrogen therapy   . Hypercholesteremia   . IBS (irritable bowel syndrome)   . Mitral regurgitation   . Pulmonary air embolism ([place])    states she had this wiht her back surgery  . Rheumatoid arthritis ([place])   . Seborrhea    patient denies    Past Surgical History:  Procedure Laterality Date  . ANTERIOR AND POSTERIOR REPAIR WITH SACROSPINOUS FIXATION N/A 01/08/2015   Procedure: ANTERIOR AND POSTERIOR REPAIR WITH possible SACROSPINOUS FIXATION;  Surgeon: Molli Posey, MD;  Location: [facility];  Service: Gynecology;  Laterality: N/A;  . CATARACT EXTRACTION Bilateral   . COLONOSCOPY    . EYE SURGERY    . LAPAROSCOPIC ASSISTED VAGINAL HYSTERECTOMY N/A 01/08/2015   Procedure: LAPAROSCOPIC ASSISTED VAGINAL HYSTERECTOMY;  Surgeon: Molli Posey, MD;  Location: [facility];  Service: Gynecology;  Laterality: N/A;  . LUMBAR FUSION  05/2010  . REVERSE SHOULDER ARTHROPLASTY Left 02/16/2014   Procedure: LEFT REVERSE TOTAL SHOULDER ARTHROPLASTY;  Surgeon: Marin Shutter, MD;  Location: [facility];  Service: Orthopedics;  Laterality: Left;  . TOE SURGERY    . TUBAL LIGATION      Family History  Problem Relation Age of Onset  . Healthy Daughter     Social History:  reports that she has never smoked. She has never used smokeless tobacco. She reports that she does not drink alcohol or use drugs.   Medications Prior to Admission  Medication Sig Dispense Refill  . aspirin 81 MG tablet Take 81 mg by mouth daily.     . Calcium Carbonate-Vit D-Min (CALCIUM 1200 PO) Take 1 capsule by mouth daily.    .  cholecalciferol (VITAMIN D) 1000 units tablet Take 1,000 Units by mouth daily.    . diclofenac sodium (VOLTAREN) 1 % GEL Apply 4 g topically 4 (four) times daily. 100 g 0  . HYDROcodone-acetaminophen (NORCO/VICODIN) 5-325 MG tablet Take 1 tablet by mouth every 4 (four) hours as needed. 16 tablet 0  . Iron-FA-B Cmp-C-Biot-Probiotic (FUSION PLUS) CAPS TAKE 1 TABLET ONCE DAILY  3  . lidocaine (LIDODERM) 5 % Place 1 patch onto the skin daily. Remove & Discard patch within 12 hours or as directed by MD 14 patch 0  . lisinopril (PRINIVIL,ZESTRIL) 5 MG tablet TAKE 1 TABLET BY MOUTH DAILY 90 tablet 3  . polyethylene glycol powder (GLYCOLAX/MIRALAX) powder MIX 1 CAPFUL IN 8 OUNCES OF WATER AND DRINK ONCE DAILY AS DIRECTED (Patient taking differently: MIX 1 CAPFUL IN 8 OUNCES OF WATER AND DRINK ONCE DAILY AS DIRECTED AS NEEDED FOR CONSTIPATION) 527 g prn  . simvastatin (ZOCOR) 20 MG tablet TAKE 1 TABLET BY MOUTH EVERY NIGHT AT BEDTIME 90 tablet 3  . triamterene-hydrochlorothiazide (MAXZIDE) 75-50 MG tablet TAKE 1 TABLET BY MOUTH EVERY DAY 90 tablet 0  . vitamin C (ASCORBIC ACID) 500 MG tablet Take 500 mg by mouth daily.    . cyclobenzaprine (FLEXERIL) 10 MG tablet Take 1 tablet (10 mg total) by mouth 3 (three) times daily as needed for muscle spasms. 30 tablet 0  .  HYDROcodone-acetaminophen (NORCO) 5-325 MG tablet One po q 12 hours for right shoulder arthropathy 60 tablet 0  . ibuprofen (ADVIL,MOTRIN) 600 MG tablet Take 1 tablet (600 mg total) by mouth every 6 (six) hours as needed. 30 tablet 0  . mupirocin ointment (BACTROBAN) 2 % APPLY TO THE AFFECTED AREA TWICE DAILY AS NEEDED 22 g 0  . omeprazole (PRILOSEC) 20 MG capsule TAKE 1 CAPSULE BY MOUTH DAILY 30 capsule 11  . ondansetron (ZOFRAN ODT) 4 MG disintegrating tablet Take 1 tablet (4 mg total) by mouth every 8 (eight) hours as needed for nausea or vomiting. 8 tablet 0  . ondansetron (ZOFRAN ODT) 4 MG disintegrating tablet Take 1 tablet (4 mg total) by  mouth every 8 (eight) hours as needed for nausea or vomiting. 20 tablet 0  . terconazole (TERAZOL 3) 0.8 % vaginal cream Place 1 applicator vaginally at bedtime. 20 g 11  . triamcinolone cream (KENALOG) 0.1 % APPLY EXTERNALLY TO THE AFFECTED AREA THREE TIMES DAILY 45 g 2     Physical Exam: Right shoulder with painful and restricted motion as noted at recent office visits  Vitals  Pulse Rate:  [58] 58 (03/15 0600) Resp:  [18] 18 (03/15 0600) BP: (91)/(53) 91/53 (03/15 0600) SpO2:  [99 %] 99 % (03/15 0600) Weight:  [70.8 kg (156 lb)] 70.8 kg (156 lb) (03/15 0600)  Assessment/Plan  Impression: Right shoulder rotator cuff tear arthropathy  Plan of Action: Procedure(s): REVERSE RIGHT SHOULDER ARTHROPLASTY  Chayim Bialas M Raimundo Corbit 08/07/2016, 7:24 AM Contact # [phone]

## 2016-08-07 NOTE — Anesthesia Procedure Notes (Signed)
Anesthesia Regional Block: Interscalene brachial plexus block   Pre-Anesthetic Checklist: ,, timeout performed, Correct Patient, Correct Site, Correct Laterality, Correct Procedure, Correct Position, site marked, Risks and benefits discussed, pre-op evaluation,  At surgeon's request and post-op pain management  Laterality: Right  Prep: Maximum Sterile Barrier Precautions used, chloraprep       Needles:  Injection technique: Single-shot  Needle Type: Echogenic Stimulator Needle     Needle Length: 5cm  Needle Gauge: 22     Additional Needles:   Procedures: ultrasound guided, nerve stimulator,,,,,,   Nerve Stimulator or Paresthesia:  Response: Biceps response,   Additional Responses:   Narrative:  Start time: 08/07/2016 6:57 AM End time: 08/07/2016 7:07 AM Injection made incrementally with aspirations every 5 mL. Anesthesiologist: Roderic Palau  Additional Notes: 2% Lidocaine skin wheel.

## 2016-08-07 NOTE — Discharge Instructions (Signed)

## 2016-08-07 NOTE — Anesthesia Procedure Notes (Signed)
Procedure Name: Intubation Date/Time: 08/07/2016 7:34 AM Performed by: Melina Copa, Jakell Trusty R Pre-anesthesia Checklist: Patient identified, Emergency Drugs available, Suction available and Patient being monitored Patient Re-evaluated:Patient Re-evaluated prior to inductionOxygen Delivery Method: Circle System Utilized Preoxygenation: Pre-oxygenation with 100% oxygen Intubation Type: IV induction Ventilation: Mask ventilation without difficulty Laryngoscope Size: Mac and 3 Grade View: Grade I Tube type: Oral Tube size: 7.0 mm Number of attempts: 1 Airway Equipment and Method: Stylet and Oral airway Placement Confirmation: ETT inserted through vocal cords under direct vision,  positive ETCO2 and breath sounds checked- equal and bilateral Secured at: 21 cm Tube secured with: Tape Dental Injury: Teeth and Oropharynx as per pre-operative assessment

## 2016-08-07 NOTE — Op Note (Signed)
08/07/2016  9:37 AM  PATIENT:   Delorise Shiner  76 y.o. female  PRE-OPERATIVE DIAGNOSIS:  Right shoulder rotator cuff tear arthropathy  POST-OPERATIVE DIAGNOSIS:  same  PROCEDURE:   R RSA #5.5 stem, +6 spacer, +3 poly, 36/+4 glenosphere, small baseplate  SURGEON:  Ryott Rafferty, Metta Clines M.D.  ASSISTANTS: Shuford pac   ANESTHESIA:   GET + ISB  EBL: 250  SPECIMEN:  none  Drains: none   PATIENT DISPOSITION:  PACU - hemodynamically stable.    PLAN OF CARE: Admit for overnight observation  Dictation# [id number]   Contact # [phone]

## 2016-08-07 NOTE — Transfer of Care (Signed)
Immediate Anesthesia Transfer of Care Note  Patient: Delorise Shiner  Procedure(s) Performed: Procedure(s): REVERSE RIGHT SHOULDER ARTHROPLASTY (Right)  Patient Location: PACU  Anesthesia Type:GA combined with regional for post-op pain  Level of Consciousness: awake, oriented and patient cooperative  Airway & Oxygen Therapy: Patient Spontanous Breathing and Patient connected to nasal cannula oxygen  Post-op Assessment: Report given to RN, Post -op Vital signs reviewed and stable and Patient moving all extremities  Post vital signs: Reviewed and stable  Last Vitals:  Vitals:   08/07/16 0600 08/07/16 0948  BP: (!) 91/53   Pulse: (!) 58   Resp: 18 18  Temp:  36.2 C    Last Pain:  Vitals:   08/07/16 0600  TempSrc: Oral  PainSc:       Patients Stated Pain Goal: 4 (53/66/44 0347)  Complications: No apparent anesthesia complications

## 2016-08-08 ENCOUNTER — Encounter (HOSPITAL_COMMUNITY): Payer: Self-pay | Admitting: Orthopedic Surgery

## 2016-08-08 LAB — CBC
HCT: 23.8 % — ABNORMAL LOW (ref 36.0–46.0)
Hemoglobin: 7.8 g/dL — ABNORMAL LOW (ref 12.0–15.0)
MCH: 31.5 pg (ref 26.0–34.0)
MCHC: 32.8 g/dL (ref 30.0–36.0)
MCV: 96 fL (ref 78.0–100.0)
Platelets: 369 10*3/uL (ref 150–400)
RBC: 2.48 MIL/uL — ABNORMAL LOW (ref 3.87–5.11)
RDW: 14.6 % (ref 11.5–15.5)
WBC: 12.6 10*3/uL — ABNORMAL HIGH (ref 4.0–10.5)

## 2016-08-08 LAB — PREPARE RBC (CROSSMATCH)

## 2016-08-08 MED ORDER — HYDROCODONE-ACETAMINOPHEN 10-325 MG PO TABS
1.0000 | ORAL_TABLET | ORAL | Status: DC | PRN
  Administered 2016-08-08: 2 via ORAL
  Administered 2016-08-08: 1 via ORAL
  Administered 2016-08-08: 2 via ORAL
  Administered 2016-08-09: 1 via ORAL
  Administered 2016-08-09: 2 via ORAL
  Filled 2016-08-08 (×2): qty 1
  Filled 2016-08-08 (×3): qty 2

## 2016-08-08 MED ORDER — DIPHENHYDRAMINE HCL 25 MG PO CAPS
25.0000 mg | ORAL_CAPSULE | Freq: Once | ORAL | Status: AC
  Administered 2016-08-08: 25 mg via ORAL
  Filled 2016-08-08: qty 1

## 2016-08-08 MED ORDER — ONDANSETRON HCL 4 MG PO TABS: 4.0000 mg | ORAL_TABLET | Freq: Three times a day (TID) | ORAL | 0 refills | Status: DC | PRN

## 2016-08-08 MED ORDER — SODIUM CHLORIDE 0.9 % IV SOLN: Freq: Once | INTRAVENOUS | Status: DC

## 2016-08-08 MED ORDER — HYDROCODONE-ACETAMINOPHEN 10-325 MG PO TABS: 1.0000 | ORAL_TABLET | ORAL | 0 refills | Status: DC | PRN

## 2016-08-08 MED ORDER — ACETAMINOPHEN 325 MG PO TABS
650.0000 mg | ORAL_TABLET | Freq: Once | ORAL | Status: AC
  Administered 2016-08-08: 650 mg via ORAL
  Filled 2016-08-08: qty 2

## 2016-08-08 MED ORDER — PANTOPRAZOLE SODIUM 40 MG PO TBEC
40.0000 mg | DELAYED_RELEASE_TABLET | Freq: Every day | ORAL | Status: DC
  Administered 2016-08-08 – 2016-08-09 (×2): 40 mg via ORAL
  Filled 2016-08-08 (×2): qty 1

## 2016-08-08 MED ORDER — DIAZEPAM 5 MG PO TABS: 2.5000 mg | ORAL_TABLET | Freq: Four times a day (QID) | ORAL | 1 refills | Status: DC | PRN

## 2016-08-08 NOTE — Evaluation (Signed)
Occupational Therapy Evaluation Patient Details Name: [name] MRN: [id number] DOB: May 07, 1941 Today's Date: 08/08/2016    History of Present Illness 76 yo female s/p  R reverse TSA   Clinical Impression   Patient is s/p R reverse TSA surgery resulting in functional limitations due to the deficits listed below (see OT problem list). PTA was living at home with daughters x2 and will have (A) from them PRN when needed.  Patient will benefit from skilled OT acutely to increase independence and safety with ADLS to allow discharge outpatient as Dr supple orders. . OT to focus on bathing/dressing with exercises next session. Pt pending blood transfusions.      Follow Up Recommendations  Outpatient OT (when Dr Onnie Graham follow ups)    Equipment Recommendations  None recommended by OT    Recommendations for Other Services       Precautions / Restrictions Precautions Precautions: Shoulder Type of Shoulder Precautions: active Shoulder Interventions: Off for dressing/bathing/exercises Precaution Comments: handout for shoulder protocol and exercises provided and reviewed in detail Required Braces or Orthoses: Sling Restrictions Weight Bearing Restrictions: Yes RUE Weight Bearing: Non weight bearing      Mobility Bed Mobility Overal bed mobility: Modified Independent             General bed mobility comments: exiting on The L to avoid R UE. Pt able to long sit so could exit on the R if needed  Transfers Overall transfer level: Needs assistance   Transfers: Sit to/from Stand Sit to Stand: Min guard         General transfer comment: pt using L UE to push up into standing    Balance                                            ADL Overall ADL's : Needs assistance/impaired Eating/Feeding: Modified independent       Upper Body Bathing: Minimal assistance   Lower Body Bathing: Minimal assistance           Toilet Transfer: Min guard             General ADL Comments: Pt completed exercises supine in bed with thearpist (A) to help with positioning. Pt tolerated 10 reps each exercises. handout provided and reviewed in detail.      Vision Baseline Vision/History: Wears glasses Wears Glasses: At all times       Perception     Praxis      Pertinent Vitals/Pain Pain Assessment: Faces Faces Pain Scale: Hurts little more Pain Location: shoulder Pain Descriptors / Indicators: Discomfort Pain Intervention(s): Monitored during session;Premedicated before session;Repositioned;Other (comment) (reports pain better after exercises)     Hand Dominance Right   Extremity/Trunk Assessment Upper Extremity Assessment Upper Extremity Assessment: RUE deficits/detail RUE Deficits / Details: s/p surg   Lower Extremity Assessment Lower Extremity Assessment: Overall WFL for tasks assessed   Cervical / Trunk Assessment Cervical / Trunk Assessment: Normal   Communication Communication Communication: No difficulties   Cognition Arousal/Alertness: Awake/alert Behavior During Therapy: WFL for tasks assessed/performed Overall Cognitive Status: Within Functional Limits for tasks assessed                     General Comments  dressing dry and intact. educated not to touch or wash on dressing    Exercises Exercises: Shoulder;Other exercises     Shoulder  Instructions Shoulder Instructions Donning/doffing sling/immobilizer: Moderate assistance ROM for elbow, wrist and digits of operated UE: Modified independent    Home Living Family/patient expects to be discharged to:: Private residence Living Arrangements: Children Available Help at Discharge: Family Type of Home: House Home Access: Stairs to enter Technical brewer of Steps: 2 Entrance Stairs-Rails: Right;Left Home Layout: One level     Bathroom Shower/Tub: Tub/shower unit Shower/tub characteristics: Architectural technologist: Standard     Home Equipment:  Shower seat;Bedside commode;Cane - single point;Hand held shower head   Additional Comments: previous L shoulder      Prior Functioning/Environment Level of Independence: Independent                 OT Problem List: Decreased strength;Decreased range of motion;Decreased activity tolerance;Impaired balance (sitting and/or standing);Decreased safety awareness;Decreased knowledge of use of DME or AE;Decreased knowledge of precautions;Pain;Impaired UE functional use      OT Treatment/Interventions: Self-care/ADL training;Therapeutic exercise;Therapeutic activities;Balance training;Patient/family education    OT Goals(Current goals can be found in the care plan section) Acute Rehab OT Goals Patient Stated Goal: to return home  OT Goal Formulation: With patient/family Time For Goal Achievement: 08/22/16 Potential to Achieve Goals: Good  OT Frequency: Min 2X/week   Barriers to D/C:            Co-evaluation              End of Session Nurse Communication: Mobility status;Precautions;Weight bearing status  Activity Tolerance: Patient tolerated treatment well Patient left: in chair;with call bell/phone within reach;with family/visitor present  OT Visit Diagnosis: Unsteadiness on feet (R26.81)                ADL either performed or assessed with clinical judgement  Time: 3546-5681 OT Time Calculation (min): 23 min Charges:  OT General Charges $OT Visit: 1 Procedure OT Evaluation $OT Eval Moderate Complexity: 1 Procedure G-Codes:      Jeri Modena   OTR/L Pager: [phone] Office: [phone] .   Parke Poisson B 08/08/2016, 1:39 PM

## 2016-08-08 NOTE — Progress Notes (Signed)
[name]  MRN: [id number] DOB/Age: May 07, 1941 76 y.o. Physician: Rada Hay Procedure: Procedure(s) (LRB): REVERSE RIGHT SHOULDER ARTHROPLASTY (Right)     Subjective: Pain is poorly controlled this am and also some stomach upset.  Pt with history of anemia that had actually postponed her surgery and had significant bleeding intraop and with history of RA her soft tissues were extremely hemorrhagic intraop  Vital Signs Temp:  [97.2 F (36.2 C)-98.6 F (37 C)] 98.6 F (37 C) (03/16 0549) Pulse Rate:  [60-78] 78 (03/16 0549) Resp:  [12-23] 16 (03/16 0549) BP: (109-127)/(50-91) 109/50 (03/16 0549) SpO2:  [98 %-100 %] 100 % (03/16 0549)  Lab Results  Recent Labs  08/08/16 0418  WBC 12.6*  HGB 7.8*  HCT 23.8*  PLT 369   BMET No results for input(s): NA, K, CL, CO2, GLUCOSE, BUN, CREATININE, CALCIUM in the last 72 hours. INR  Date Value Ref Range Status  02/08/2014 0.97 0.00 - 1.49 Final     Exam Post op dressing dry NVI with sensation intact        Plan Post op Blood loss anemia on top of preexisting chronic anemia  s/p R reverse shoulder   Will change meds and transfuse today 2 units PRBC, hold discharge until tomorrow  [facility] PA-C  for Dr.Kevin Supple 08/08/2016, 7:51 AM Contact # [phone]

## 2016-08-08 NOTE — Op Note (Signed)
NAME:  [name], [name]                    ACCOUNT NO.:  MEDICAL RECORD NO.:  [id number]  LOCATION:                                 FACILITY:  PHYSICIAN:  Metta Clines. Josely Moffat, M.D.  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  PROCEDURE:  Right shoulder reverse arthroplasty utilizing a press-fit size 5.5 Arthrex stem, a +6 spacer, +3 polyethylene insert on a posterior offset stem with a 36, +4 glenosphere on a small baseplate.  SURGEON:  Metta Clines. Henryetta Corriveau, M.D.  Terrence DupontOlivia Mackie A. Shuford, PA-C.  ANESTHESIA:  General endotracheal as well as an interscalene block.  ESTIMATED BLOOD LOSS:  250 mL.  DRAINS:  None.  HISTORY:  [name] is a 76 year old female, who has had chronic and progressive increasing right shoulder pain.  She is well known to our practice after previous left shoulder reverse arthroplasty for end-stage rotator cuff tear arthropathy.  She is now brought to the operating room for planned right shoulder reverse arthroplasty.  Preoperatively, I had counseled [name] regarding treatment options and potential risks versus benefits thereof.  Possible surgical complications were all reviewed including bleeding, infection, neurovascular injury, persistent pain, loss of motion, anesthetic complication, failure of the implant, and possible need for additional surgery.  She understands and accepts and agrees with our planned procedure.  PROCEDURE IN DETAIL:  After undergoing routine preop evaluation, the patient received prophylactic antibiotics and an interscalene block was established in the holding area of the Anesthesia Department.  Placed supine on the operating table.  Underwent smooth induction of a  general endotracheal anesthesia.  Placed in a beach chair position and appropriately padded and protected.  The right shoulder girdle region was sterilely prepped and draped in a standard fashion.  A time-out was called.  An anterior deltopectoral approach to the right shoulder was made through an anterior 8 cm incision.  Skin flaps were elevated. Dissection carried deeply.  She had anterosuperior humeral head escape in the interval between the deltoid and the pec major was not well identified and her cephalic vein had become very torturous and to gain appropriate exposure, we went ahead and ligated the cephalic vein and had to develop the interval from proximal to distal with some considerable bleeding encountered.  We did ultimately use a TXA soaked sponge in addition to the IV TXA given at the beginning of the case. Ultimately, we gained appropriate exposure and hemostasis.  She was noted to have marked inflammation of the subacromial/subdeltoid bursa, which was very thickened, fibrotic, and fluid-filled.  We performed an excision of the subacromial/subdeltoid bursa and found multiple synovial overgrowths and performed an extensive synovectomy.  Again, it was all very vascular and ultimately did obtain a good debridement and hemostasis.  Once we had the humeral head exposed, we went ahead and divided the subscapularis from the lesser tuberosity and tagged the free margin with a pair of figure-of-eight grasping FiberWire sutures and then mobilized  the subscap and retracted this medially after we had previously mobilized and retracted the conjoined tendon medially.  The humeral head was then delivered through the wound after we divided the capsular attachments on the anterior and inferior aspects of the humeral neck and the humeral head was completely bare with complete loss of the superior and majority of the posterior aspects of the rotator cuff insertions.  At this point, we used an  extramedullary guide to outline our proposed humeral head resection, which we then performed using an oscillating saw at approximately 10 degrees of retroversion.  We then removed the osteophytes on the margin of the humeral head, placed a metal cap over the cut metaphysis, and then exposed the glenoid with a combination of Fukuda, pitchfork, and snake tongue retractors.  Again, there was significant synovitis and we performed a synovectomy and then complete labral resection gaining visualization of the periphery of the glenoid and there had been significant medial erosion and somewhat superior erosion of the glenoid.  This necessitated some significant redirection of our guide pin and ultimately reamed the glenoid correcting the superior-ward angulation and removing significant bony overgrowth on the scapular neck region.  Once this was appropriately prepared with a sequence of central peripheral reaming, we then selected a small base plate, impacted this into position with good fit and fixation, placed our central lag screw and then inferior and superior locking screws, all of which obtained good bony purchase and fixation. At this point, we then inserted the 36, +4 glenosphere onto the baseplate.  This was impacted and showed good stability and fixation. Once this was completed, we turned our attention to the proximal humerus, where we hand reamed the canal to size 6 and broached to size 5.5 at the 10-degree retroversion and excellent fixation was achieved. We then prepared the metaphysis with the +2 posterior offset and this was then reamed.  We then impacted the trial stem and performed a trial reduction showing good soft tissue balance.  At this point, the joint was then dislocated.  The trial stem was removed.  The canal was irrigated.  We assembled the final stem on the back table.  It was impacted into position with excellent fit and fixation.  We then performed a series of trial  reductions and ultimately felt that a total of +9 with +6 spacer and +3 poly with appropriate soft tissue balance. The final implants were opened and transfixed to our stem.  The final reduction was then performed.  I was very pleased with the overall soft tissue balance, excellent shoulder motion, and stability.  At this point, the subscapularis was then mobilized.  We then repaired it back to the humeral neck through the eyelets on the metaphysis of our stem using the FiberWires.  This allowed easily 45 degrees of external rotation with the arm on the side.  We then copiously irrigated the joint.  Hemostasis was achieved.  The deltopectoral interval was then reapproximated with a series of figure-of-eight #1 Vicryl sutures.  2-0 Monocryl was used for the subcuticular layer, intracuticular 3-0 Monocryl for the skin, followed by Dermabond and Aquacel dressing. Right arm was placed in a sling.  The patient was awakened, extubated, and taken to the recovery room in stable condition.  Jenetta Loges, PA-C, was used as an Environmental consultant throughout this case, was essential for help with positioning the patient, positioning the extremity, management of the retractors, tissue manipulation, suture management, implantation of prosthesis, wound closure, and intraoperative decision making.  Metta Clines. Brahim Dolman, M.D.     KMS/MEDQ  D:  08/07/2016  T:  08/07/2016  Job:  [id number]

## 2016-08-08 NOTE — Progress Notes (Signed)
Nutrition Brief Note  Patient identified on the Malnutrition Screening Tool (MST) Report  Wt Readings from Last 15 Encounters:  08/07/16 156 lb (70.8 kg)  08/04/16 156 lb (70.8 kg)  07/31/16 155 lb (70.3 kg)  07/30/16 155 lb 4.8 oz (70.4 kg)  07/22/16 156 lb (70.8 kg)  06/02/16 159 lb (72.1 kg)  05/14/16 158 lb (71.7 kg)  05/10/16 160 lb (72.6 kg)  02/07/16 165 lb 6.4 oz (75 kg)  01/14/16 159 lb (72.1 kg)  12/17/15 164 lb (74.4 kg)  10/01/15 166 lb 6.4 oz (75.5 kg)  08/29/15 165 lb (74.8 kg)  06/07/15 167 lb (75.8 kg)  05/10/15 170 lb (77.1 kg)   The patient is a 76 y.o. female with end stage right shoulder rotator cuff tear arthropathy.   S/p Procedure(s) (LRB) 08/06/16: REVERSE RIGHT SHOULDER ARTHROPLASTY (Right)  Spoke with pt at bedside, who has no complaints other than rt shoulder pain.   She shares that she has a fair appetite at baseline, however, is still able to consumes 3 meals and 1-2 small snacks daily. She reports that she has progressively lost weight over the years, related to use of diuretics. Wt has been stable over the past year.   Pt reports she consumed 100% of her breakfast this morning. Discussed importance of good meal completion to promote healing. Pt does not like the taste of nutritional supplements and has refused Ensure during hospitalization. She politely declined further nutrition interventions.   Nutrition-Focused physical exam completed. Findings are no fat depletion, no muscle depletion, and no edema.   Body mass index is 28.53 kg/m. Patient meets criteria for overweight based on current BMI.   Current diet order is regular, patient is consuming approximately 75% of meals at this time. Labs and medications reviewed.   No nutrition interventions warranted at this time. If nutrition issues arise, please consult RD.   Meleana Commerford A. Jimmye Norman, RD, LDN, CDE Pager: [phone] After hours Pager: [phone]

## 2016-08-08 NOTE — Progress Notes (Signed)
PT Cancellation Note  Patient Details Name: [name] MRN: [id number] DOB: 02-01-1941   Cancelled Treatment:    Reason Eval/Treat Not Completed: PT screened, no needs identified, will sign off (Per OT, pt has no PT needs.  HAs family support as well. )Thanks.    [place] 08/08/2016, 11:42 AM [facility] [phone] (pager)

## 2016-08-09 LAB — BPAM RBC
Blood Product Expiration Date: 201803292359
Blood Product Expiration Date: 201803302359
ISSUE DATE / TIME: 201803161145
ISSUE DATE / TIME: 201803161540
Unit Type and Rh: 6200
Unit Type and Rh: 6200

## 2016-08-09 LAB — TYPE AND SCREEN
ABO/RH(D): A POS
Antibody Screen: NEGATIVE
Unit division: 0
Unit division: 0

## 2016-08-09 LAB — HEMOGLOBIN AND HEMATOCRIT, BLOOD
HCT: 28 % — ABNORMAL LOW (ref 36.0–46.0)
Hemoglobin: 9.6 g/dL — ABNORMAL LOW (ref 12.0–15.0)

## 2016-08-09 NOTE — Progress Notes (Signed)
Subjective: 2 Days Post-Op Procedure(s) (LRB): REVERSE RIGHT SHOULDER ARTHROPLASTY (Right) Patient reports pain as moderate.  Reports pain improved since yesterday. No other c/o. No Numbness, tingling, CP, SOB, N/V. Would like to go home today.  Hgb up from 7.8 yesterday to 9.6 today following transfusion 2 units PRBCs yesterday.  Objective: Vital signs in last 24 hours: Temp:  [97.8 F (36.6 C)-98.8 F (37.1 C)] 98.5 F (36.9 C) (03/17 0700) Pulse Rate:  [60-74] 74 (03/17 0915) Resp:  [18-20] 18 (03/16 2020) BP: (98-127)/(45-87) 118/87 (03/17 0915) SpO2:  [98 %-100 %] 98 % (03/17 0700)  Intake/Output from previous day: 03/16 0701 - 03/17 0700 In: 1742 [P.O.:870; Blood:872] Out: -  Intake/Output this shift: No intake/output data recorded.   Recent Labs  08/08/16 0418 08/09/16 0052  HGB 7.8* 9.6*    Recent Labs  08/08/16 0418 08/09/16 0052  WBC 12.6*  --   RBC 2.48*  --   HCT 23.8* 28.0*  PLT 369  --    No results for input(s): NA, K, CL, CO2, BUN, CREATININE, GLUCOSE, CALCIUM in the last 72 hours. No results for input(s): LABPT, INR in the last 72 hours.  Neurologically intact ABD soft Neurovascular intact Sensation intact distally Intact pulses distally Dorsiflexion/Plantar flexion intact Incision: dressing C/D/I and no drainage No cellulitis present Compartment soft no sign of DVT  Assessment/Plan: 2 Days Post-Op Procedure(s) (LRB): REVERSE RIGHT SHOULDER ARTHROPLASTY (Right) Advance diet Up with therapy D/C IV fluids  D/C home today Discussed d/C instructions Follow up outpt with Dr. Onnie Graham as directed  Cecilie Kicks. 08/09/2016, 9:57 AM

## 2016-08-09 NOTE — Progress Notes (Signed)
Occupational Therapy Treatment Patient Details Name: [name] MRN: [id number] DOB: 19-Jan-1941 Today's Date: 08/09/2016    History of present illness 76 yo female s/p reverse TSA. PMH significant for arthritis, cervical stenosis of spine, dysphasia, essential hypertension (benign), GERD, Glaucoma, heart murmur, history of estrogen therapy, hypercholesterolemia, IBS, mitral regurgitation, pulmonary air embolism, rhematoid arthritis, seborrhea.   OT comments  Pt progressing toward OT goals. She was able to complete UB dressing tasks and don sling with min assist from therapist. No family present during session for continued education. Pt able to recall all shoulder precautions and was able to complete AAROM HEP per active shoulder protocol this session with handout previously provided. Pt demonstrates good understanding of shoulder active protocol and is able to direct caregiver in dressing/bathing techniques. D/C plan remains appropriate. OT will continue to follow while admitted.   Follow Up Recommendations  Outpatient OT (when ready per Dr. Onnie Graham)    Equipment Recommendations  None recommended by OT    Recommendations for Other Services      Precautions / Restrictions Precautions Precautions: Shoulder Type of Shoulder Precautions: active Shoulder Interventions: Off for dressing/bathing/exercises Precaution Booklet Issued: Yes (comment) Precaution Comments: handout for shoulder protocol and exercises provided and reviewed in detail Required Braces or Orthoses: Sling Restrictions Weight Bearing Restrictions: Yes RUE Weight Bearing: Non weight bearing       Mobility Bed Mobility Overal bed mobility: Modified Independent             General bed mobility comments: exiting on The L to avoid R UE. Pt able to long sit so could exit on the R if needed  Transfers                      Balance Overall balance assessment: Needs assistance Sitting-balance support: No  upper extremity supported;Feet supported Sitting balance-Leahy Scale: Good                             ADL Overall ADL's : Needs assistance/impaired                 Upper Body Dressing : Minimal assistance;Sitting Upper Body Dressing Details (indicate cue type and reason): Min assist for sling and shirt.                   General ADL Comments: Educated pt on dressing/bathing techniques to adhere to shoulder precautions. Reviewed active shoulder protocol HEP per MD orders with min assist for positioning.       Vision                     Perception     Praxis      Cognition   Behavior During Therapy: WFL for tasks assessed/performed Overall Cognitive Status: Within Functional Limits for tasks assessed                         Exercises Shoulder Exercises Shoulder Flexion: AAROM;10 reps;Right;Supine (able to achieve ~10 degrees) Shoulder ABduction: AAROM;Right;10 reps;Seated (Able to achieve ~30 degrees) Elbow Flexion: AROM;10 reps;Seated Wrist Flexion: AROM;10 reps;Seated Digit Composite Flexion: AROM;10 reps;Seated Donning/doffing shirt without moving shoulder: Minimal assistance Method for sponge bathing under operated UE: Minimal assistance Donning/doffing sling/immobilizer: Minimal assistance Correct positioning of sling/immobilizer: Supervision/safety ROM for elbow, wrist and digits of operated UE: Modified independent Sling wearing schedule (on at all times/off for ADL's): Supervision/safety Proper positioning  of operated UE when showering: Supervision/safety Positioning of UE while sleeping: Supervision/safety   Shoulder Instructions Shoulder Instructions Donning/doffing shirt without moving shoulder: Minimal assistance Method for sponge bathing under operated UE: Minimal assistance Donning/doffing sling/immobilizer: Minimal assistance Correct positioning of sling/immobilizer: Supervision/safety ROM for elbow, wrist and  digits of operated UE: Modified independent Sling wearing schedule (on at all times/off for ADL's): Supervision/safety Proper positioning of operated UE when showering: Supervision/safety Positioning of UE while sleeping: Supervision/safety     General Comments      Pertinent Vitals/ Pain       Pain Assessment: Faces Faces Pain Scale: Hurts even more Pain Location: shoulder Pain Descriptors / Indicators: Discomfort Pain Intervention(s): Monitored during session;Repositioned;Premedicated before session  Home Living                                          Prior Functioning/Environment              Frequency  Min 2X/week        Progress Toward Goals  OT Goals(current goals can now be found in the care plan section)  Progress towards OT goals: Progressing toward goals  Acute Rehab OT Goals Patient Stated Goal: to return home  OT Goal Formulation: With patient/family Time For Goal Achievement: 08/22/16 Potential to Achieve Goals: Good ADL Goals Pt/caregiver will Perform Home Exercise Program: Right Upper extremity;With written HEP provided;With minimal assist Additional ADL Goal #1: pt will complete dressing R UE at min (A) levle from family   Plan Discharge plan remains appropriate    Co-evaluation                 End of Session    OT Visit Diagnosis: Unsteadiness on feet (R26.81);Pain Pain - Right/Left: Right Pain - part of body: Shoulder   Activity Tolerance Patient tolerated treatment well   Patient Left with call bell/phone within reach;in bed   Nurse Communication Mobility status;Other (comment) (Pt ready for D/C from OT perspective)        Time: 4825-0037 OT Time Calculation (min): 16 min  Charges: OT General Charges $OT Visit: 1 Procedure OT Treatments $Therapeutic Exercise: 8-22 mins  Norman Herrlich, MS OTR/L  Pager: [place] 08/09/2016, 11:21 AM

## 2016-08-09 NOTE — Progress Notes (Signed)
Reviewed discharge instructions/medications with patient.  Answered her questions.  OT signed off on patient.  Patient will be doing OT outpatient at Dr. Augustin Coupe office.  Patient is waiting on ride.

## 2016-08-09 NOTE — Discharge Summary (Signed)
Patient ID: [name] MRN: [id number] DOB/AGE: Oct 18, 1940 76 y.o.  Admit date: 08/07/2016 Discharge date: 08/09/2016  Admission Diagnoses:  Active Problems:   S/P reverse total shoulder arthroplasty, right   Discharge Diagnoses:  Same  Past Medical History:  Diagnosis Date  . Arthritis   . Cervical stenosis of spine   . Dysphasia   . Essential hypertension, benign   . GERD (gastroesophageal reflux disease)   . Glaucoma   . Heart murmur   . History of estrogen therapy   . Hypercholesteremia   . IBS (irritable bowel syndrome)   . Mitral regurgitation   . Pulmonary air embolism ([place])    states she had this wiht her back surgery  . Rheumatoid arthritis ([place])   . Seborrhea    patient denies    Surgeries: Procedure(s): REVERSE RIGHT SHOULDER ARTHROPLASTY on 08/07/2016   Consultants:   Discharged Condition: Improved  Hospital Course: [name] is an 76 y.o. female who was admitted 08/07/2016 for operative treatment of<principal problem not specified>. Patient has severe unremitting pain that affects sleep, daily activities, and work/hobbies. After pre-op clearance the patient was taken to the operating room on 08/07/2016 and underwent  Procedure(s): REVERSE RIGHT SHOULDER ARTHROPLASTY.    Patient was given perioperative antibiotics: Anti-infectives    Start     Dose/Rate Route Frequency Ordered Stop   08/07/16 1400  ceFAZolin (ANCEF) IVPB 2g/100 mL premix     2 g 200 mL/hr over 30 Minutes Intravenous Every 6 hours 08/07/16 1117 08/08/16 0506   08/07/16 0700  ceFAZolin (ANCEF) IVPB 2g/100 mL premix     2 g 200 mL/hr over 30 Minutes Intravenous To ShortStay Surgical 08/06/16 0921 08/07/16 0735   08/06/16 0930  ceFAZolin (ANCEF) IVPB 2g/100 mL premix  Status:  Discontinued     2 g 200 mL/hr over 30 Minutes Intravenous On call to O.R. 08/06/16 7824 08/06/16 0921       Patient was given sequential compression devices, early ambulation, and chemoprophylaxis to  prevent DVT.  Patient benefited maximally from hospital stay and there were no complications.    Recent vital signs: Patient Vitals for the past 24 hrs:  BP Temp Temp src Pulse Resp SpO2  08/09/16 0915 118/87 - - 74 - -  08/09/16 0700 (!) 98/46 98.5 F (36.9 C) Oral 72 - 98 %  08/08/16 2020 (!) 127/49 97.8 F (36.6 C) Oral 70 18 99 %  08/08/16 2001 (!) 113/52 98.4 F (36.9 C) Oral 71 - 99 %  08/08/16 1559 (!) 122/56 98.6 F (37 C) Oral 69 18 100 %  08/08/16 1545 (!) 120/58 98.8 F (37.1 C) Oral 66 18 100 %  08/08/16 1500 (!) 115/50 98.8 F (37.1 C) Oral 68 20 100 %  08/08/16 1200 (!) 100/45 98.1 F (36.7 C) Oral 60 18 99 %  08/08/16 1145 103/62 98.4 F (36.9 C) Oral 67 18 100 %     Recent laboratory studies:  Recent Labs  08/08/16 0418 08/09/16 0052  WBC 12.6*  --   HGB 7.8* 9.6*  HCT 23.8* 28.0*  PLT 369  --      Discharge Medications:   Allergies as of 08/09/2016      Reactions   Amoxicillin-pot Clavulanate Diarrhea, Itching   PATIENT DOES NOT RECALL ALLERGIC REACTION TO AUGMENTIN. Has patient had a PCN reaction causing immediate rash, facial/tongue/throat swelling, SOB or lightheadedness with hypotension: UNKNOWN Has patient had a PCN reaction causing severe rash involving mucus membranes or skin  necrosis: UNKNOWN Has patient had a PCN reaction that required hospitalization No Has patient had a PCN reaction occurring within the last 10 years: No If all of the above answers are "NO", then may proceed    Latex Itching   Latex gloves    Tramadol Nausea And Vomiting      Medication List    STOP taking these medications   HYDROcodone-acetaminophen 5-325 MG tablet Commonly known as:  [place] Replaced by:  HYDROcodone-acetaminophen 10-325 MG tablet You also have another medication with the same name that you need to continue taking as instructed.     TAKE these medications   aspirin 81 MG tablet Take 81 mg by mouth daily.   CALCIUM 1200 PO Take 1 capsule by  mouth daily.   cholecalciferol 1000 units tablet Commonly known as:  VITAMIN D Take 1,000 Units by mouth daily.   cyclobenzaprine 10 MG tablet Commonly known as:  FLEXERIL Take 1 tablet (10 mg total) by mouth 3 (three) times daily as needed for muscle spasms.   diazepam 5 MG tablet Commonly known as:  VALIUM Take 0.5-1 tablets (2.5-5 mg total) by mouth every 6 (six) hours as needed for muscle spasms or sedation.   diclofenac sodium 1 % Gel Commonly known as:  VOLTAREN Apply 4 g topically 4 (four) times daily.   FUSION PLUS Caps TAKE 1 TABLET ONCE DAILY   HYDROcodone-acetaminophen 5-325 MG tablet Commonly known as:  NORCO/VICODIN Take 1 tablet by mouth every 4 (four) hours as needed. What changed:  Another medication with the same name was added. Make sure you understand how and when to take each.  Another medication with the same name was removed. Continue taking this medication, and follow the directions you see here.   HYDROcodone-acetaminophen 10-325 MG tablet Commonly known as:  NORCO Take 1 tablet by mouth every 4 (four) hours as needed for moderate pain. What changed:  You were already taking a medication with the same name, and this prescription was added. Make sure you understand how and when to take each. Replaces:  HYDROcodone-acetaminophen 5-325 MG tablet   ibuprofen 600 MG tablet Commonly known as:  ADVIL,MOTRIN Take 1 tablet (600 mg total) by mouth every 6 (six) hours as needed.   lidocaine 5 % Commonly known as:  LIDODERM Place 1 patch onto the skin daily. Remove & Discard patch within 12 hours or as directed by MD   lisinopril 5 MG tablet Commonly known as:  PRINIVIL,ZESTRIL TAKE 1 TABLET BY MOUTH DAILY   mupirocin ointment 2 % Commonly known as:  BACTROBAN APPLY TO THE AFFECTED AREA TWICE DAILY AS NEEDED   omeprazole 20 MG capsule Commonly known as:  PRILOSEC TAKE 1 CAPSULE BY MOUTH DAILY   ondansetron 4 MG disintegrating tablet Commonly known  as:  ZOFRAN ODT Take 1 tablet (4 mg total) by mouth every 8 (eight) hours as needed for nausea or vomiting.   ondansetron 4 MG disintegrating tablet Commonly known as:  ZOFRAN ODT Take 1 tablet (4 mg total) by mouth every 8 (eight) hours as needed for nausea or vomiting.   ondansetron 4 MG tablet Commonly known as:  ZOFRAN Take 1 tablet (4 mg total) by mouth every 8 (eight) hours as needed for nausea or vomiting.   polyethylene glycol powder powder Commonly known as:  GLYCOLAX/MIRALAX MIX 1 CAPFUL IN 8 OUNCES OF WATER AND DRINK ONCE DAILY AS DIRECTED What changed:  See the new instructions.   simvastatin 20 MG tablet Commonly known  as:  ZOCOR TAKE 1 TABLET BY MOUTH EVERY NIGHT AT BEDTIME   terconazole 0.8 % vaginal cream Commonly known as:  TERAZOL 3 Place 1 applicator vaginally at bedtime.   triamcinolone cream 0.1 % Commonly known as:  KENALOG APPLY EXTERNALLY TO THE AFFECTED AREA THREE TIMES DAILY   triamterene-hydrochlorothiazide 75-50 MG tablet Commonly known as:  MAXZIDE TAKE 1 TABLET BY MOUTH EVERY DAY   vitamin C 500 MG tablet Commonly known as:  ASCORBIC ACID Take 500 mg by mouth daily.       Diagnostic Studies: Dg Lumbar Spine Complete  Result Date: 08/02/2016 CLINICAL DATA:  Low back pain without known injury x1 week. History of back surgery in 2012. EXAM: LUMBAR SPINE - COMPLETE 4+ VIEW COMPARISON:  07/01/2011 lumbar spine radiographs FINDINGS: Transitional lumbosacral anatomy with the lowest square vertebral body labeled S1. Numbering system as per prior. Intact fusion hardware spanning L4 through S1 with interbody blocks in place, at L4-5 and L5-S1. Progression of posterior disc space narrowing at L3-4. Stable mild L2-3 disc space narrowing. No acute fracture, hardware failure nor bone destruction. Aortoiliac atherosclerosis. IMPRESSION: 1. Progression of degenerative disc disease at L3-4 since prior exam. 2. Intact L4 through S1 lumbar fusion. 3. Transitional  lumbosacral anatomy. Electronically Signed   By: Ashley Royalty M.D.   On: 08/02/2016 00:55    Disposition: 01-Home or Self Care  Discharge Instructions    Call MD / Call 911    Complete by:  As directed    If you experience chest pain or shortness of breath, CALL 911 and be transported to the hospital emergency room.  If you develope a fever above 101 F, pus (white drainage) or increased drainage or redness at the wound, or calf pain, call your surgeon's office.   Constipation Prevention    Complete by:  As directed    Drink plenty of fluids.  Prune juice may be helpful.  You may use a stool softener, such as Colace (over the counter) 100 mg twice a day.  Use MiraLax (over the counter) for constipation as needed.   Diet - low sodium heart healthy    Complete by:  As directed    Increase activity slowly as tolerated    Complete by:  As directed       Follow-up Information    Metta Clines SUPPLE, MD.   Specialty:  Orthopedic Surgery Why:  call to be seen in 10 - 14 days Contact information: [address] [place] [address] [phone]            Signed: Cecilie Kicks. 08/09/2016, 9:59 AM

## 2016-08-18 DIAGNOSIS — H402211 Chronic angle-closure glaucoma, right eye, mild stage: Secondary | ICD-10-CM | POA: Diagnosis not present

## 2016-08-18 DIAGNOSIS — H35033 Hypertensive retinopathy, bilateral: Secondary | ICD-10-CM | POA: Diagnosis not present

## 2016-08-18 DIAGNOSIS — H402222 Chronic angle-closure glaucoma, left eye, moderate stage: Secondary | ICD-10-CM | POA: Diagnosis not present

## 2016-08-18 DIAGNOSIS — H40052 Ocular hypertension, left eye: Secondary | ICD-10-CM | POA: Diagnosis not present

## 2016-08-20 DIAGNOSIS — Z96611 Presence of right artificial shoulder joint: Secondary | ICD-10-CM | POA: Diagnosis not present

## 2016-08-20 DIAGNOSIS — Z471 Aftercare following joint replacement surgery: Secondary | ICD-10-CM | POA: Diagnosis not present

## 2016-09-05 ENCOUNTER — Other Ambulatory Visit: Payer: Self-pay | Admitting: Internal Medicine

## 2016-09-08 DIAGNOSIS — E790 Hyperuricemia without signs of inflammatory arthritis and tophaceous disease: Secondary | ICD-10-CM | POA: Diagnosis not present

## 2016-09-08 DIAGNOSIS — M1990 Unspecified osteoarthritis, unspecified site: Secondary | ICD-10-CM | POA: Diagnosis not present

## 2016-09-08 DIAGNOSIS — M059 Rheumatoid arthritis with rheumatoid factor, unspecified: Secondary | ICD-10-CM | POA: Diagnosis not present

## 2016-09-08 DIAGNOSIS — M25571 Pain in right ankle and joints of right foot: Secondary | ICD-10-CM | POA: Diagnosis not present

## 2016-09-09 ENCOUNTER — Other Ambulatory Visit: Payer: Self-pay | Admitting: Internal Medicine

## 2016-09-09 DIAGNOSIS — H40052 Ocular hypertension, left eye: Secondary | ICD-10-CM | POA: Diagnosis not present

## 2016-09-29 ENCOUNTER — Other Ambulatory Visit: Payer: Self-pay | Admitting: Internal Medicine

## 2016-10-13 DIAGNOSIS — Z1231 Encounter for screening mammogram for malignant neoplasm of breast: Secondary | ICD-10-CM | POA: Diagnosis not present

## 2016-10-13 DIAGNOSIS — Z803 Family history of malignant neoplasm of breast: Secondary | ICD-10-CM | POA: Diagnosis not present

## 2016-10-19 ENCOUNTER — Other Ambulatory Visit: Payer: Self-pay | Admitting: Internal Medicine

## 2016-10-21 DIAGNOSIS — H40052 Ocular hypertension, left eye: Secondary | ICD-10-CM | POA: Diagnosis not present

## 2016-10-21 DIAGNOSIS — H402222 Chronic angle-closure glaucoma, left eye, moderate stage: Secondary | ICD-10-CM | POA: Diagnosis not present

## 2016-10-21 DIAGNOSIS — H402211 Chronic angle-closure glaucoma, right eye, mild stage: Secondary | ICD-10-CM | POA: Diagnosis not present

## 2016-10-23 ENCOUNTER — Other Ambulatory Visit: Payer: Self-pay | Admitting: Internal Medicine

## 2016-10-29 ENCOUNTER — Telehealth: Payer: Self-pay

## 2016-10-29 MED ORDER — FLUCONAZOLE 150 MG PO TABS
150.0000 mg | ORAL_TABLET | Freq: Once | ORAL | 1 refills | Status: AC
Start: 2016-10-29 — End: 2016-10-29

## 2016-10-29 NOTE — Telephone Encounter (Signed)
Pt is aware and medication sent 

## 2016-10-29 NOTE — Telephone Encounter (Signed)
Pt called and stated that her itching has not subsided since being treated for the yeast infection. Said the cream she was told to use she has been using and went through three tubes with no relief. Please advise.

## 2016-10-29 NOTE — Telephone Encounter (Signed)
Call in Diflucan 150 mg tablet with one refill. Repeat tablet in 5 days if not better.

## 2016-11-03 DIAGNOSIS — Z96611 Presence of right artificial shoulder joint: Secondary | ICD-10-CM | POA: Diagnosis not present

## 2016-11-03 DIAGNOSIS — Z471 Aftercare following joint replacement surgery: Secondary | ICD-10-CM | POA: Diagnosis not present

## 2016-11-04 ENCOUNTER — Encounter: Payer: Self-pay | Admitting: Internal Medicine

## 2016-11-04 ENCOUNTER — Telehealth: Payer: Self-pay | Admitting: Internal Medicine

## 2016-11-04 ENCOUNTER — Ambulatory Visit (INDEPENDENT_AMBULATORY_CARE_PROVIDER_SITE_OTHER): Payer: Medicare Other | Admitting: Internal Medicine

## 2016-11-04 VITALS — BP 130/74 | HR 59 | Temp 97.7°F | Wt 153.0 lb

## 2016-11-04 DIAGNOSIS — L299 Pruritus, unspecified: Secondary | ICD-10-CM

## 2016-11-04 MED ORDER — METHYLPREDNISOLONE ACETATE 80 MG/ML IJ SUSP
80.0000 mg | Freq: Once | INTRAMUSCULAR | Status: AC
  Administered 2016-11-04: 80 mg via INTRAMUSCULAR

## 2016-11-04 MED ORDER — CLOTRIMAZOLE-BETAMETHASONE 1-0.05 % EX CREA: 1.0000 "application " | TOPICAL_CREAM | Freq: Two times a day (BID) | CUTANEOUS | 2 refills | Status: DC

## 2016-11-04 NOTE — Progress Notes (Signed)
   Subjective:    Patient ID: [name], female    DOB: [birth year], 76 y.o.   MRN: [id number]  HPI Patient complaining elderly of vaginal itching but it has gotten better. She's been using Terazol 7 vaginal cream. We also prescribed Diflucan. She now says itching is more on the external genital area. No relief with cream. History of well-controlled diabetes mellitus.    Review of Systems     Objective:   Physical Exam Inspection of vagina revealed very little discharge in vaginal vault. There is not a lot of erythema around the external genitalia or significant intertrigo inside her legs to her groin.       Assessment & Plan:  Itching  Plan: Given Depo-Medrol 80 mg IM. Have prescribed Lotrisone cream to apply to external genital area and groin area twice daily. Patient reassured. If symptoms persist, she will need to see gynecologist.

## 2016-11-04 NOTE — Telephone Encounter (Signed)
Appt made

## 2016-11-04 NOTE — Patient Instructions (Addendum)
Lotrisone cream to external genitalia and groin area twice daily. If symptoms persist, need to see gynecologist. Depomedrol given

## 2016-11-04 NOTE — Telephone Encounter (Signed)
Needs OV.  

## 2016-11-04 NOTE — Telephone Encounter (Signed)
Patient has taken everything that was given her but her yeast infection is not better.  Patient states it's coming down her legs now.  Please advise.

## 2016-12-01 ENCOUNTER — Ambulatory Visit: Payer: Self-pay | Admitting: Internal Medicine

## 2016-12-09 ENCOUNTER — Other Ambulatory Visit: Payer: Self-pay | Admitting: Internal Medicine

## 2016-12-09 DIAGNOSIS — E119 Type 2 diabetes mellitus without complications: Secondary | ICD-10-CM | POA: Diagnosis not present

## 2016-12-09 DIAGNOSIS — E785 Hyperlipidemia, unspecified: Secondary | ICD-10-CM | POA: Diagnosis not present

## 2016-12-09 LAB — HEPATIC FUNCTION PANEL
ALT: 14 U/L (ref 6–29)
AST: 24 U/L (ref 10–35)
Albumin: 3.9 g/dL (ref 3.6–5.1)
Alkaline Phosphatase: 71 U/L (ref 33–130)
Bilirubin, Direct: 0.1 mg/dL (ref <=0.2)
Indirect Bilirubin: 0.3 mg/dL (ref 0.2–1.2)
Total Bilirubin: 0.4 mg/dL (ref 0.2–1.2)
Total Protein: 6.8 g/dL (ref 6.1–8.1)

## 2016-12-09 LAB — LIPID PANEL
Cholesterol: 214 mg/dL — ABNORMAL HIGH (ref <200)
HDL: 66 mg/dL (ref >50)
LDL Cholesterol: 130 mg/dL — ABNORMAL HIGH (ref <100)
Total CHOL/HDL Ratio: 3.2 Ratio (ref <5.0)
Triglycerides: 91 mg/dL (ref <150)
VLDL: 18 mg/dL (ref <30)

## 2016-12-10 LAB — HEMOGLOBIN A1C
Hgb A1c MFr Bld: 5.7 % — ABNORMAL HIGH (ref <5.7)
Mean Plasma Glucose: 117 mg/dL

## 2016-12-11 ENCOUNTER — Encounter: Payer: Self-pay | Admitting: Internal Medicine

## 2016-12-11 ENCOUNTER — Other Ambulatory Visit: Payer: Self-pay | Admitting: Internal Medicine

## 2016-12-11 ENCOUNTER — Ambulatory Visit (INDEPENDENT_AMBULATORY_CARE_PROVIDER_SITE_OTHER): Payer: Medicare Other | Admitting: Internal Medicine

## 2016-12-11 VITALS — BP 112/62 | HR 57 | Temp 97.8°F | Wt 149.0 lb

## 2016-12-11 DIAGNOSIS — R7302 Impaired glucose tolerance (oral): Secondary | ICD-10-CM | POA: Diagnosis not present

## 2016-12-11 DIAGNOSIS — I10 Essential (primary) hypertension: Secondary | ICD-10-CM

## 2016-12-11 DIAGNOSIS — E785 Hyperlipidemia, unspecified: Secondary | ICD-10-CM

## 2016-12-11 DIAGNOSIS — N183 Chronic kidney disease, stage 3 unspecified: Secondary | ICD-10-CM

## 2016-12-11 DIAGNOSIS — Z8639 Personal history of other endocrine, nutritional and metabolic disease: Secondary | ICD-10-CM | POA: Diagnosis not present

## 2016-12-11 DIAGNOSIS — M05711 Rheumatoid arthritis with rheumatoid factor of right shoulder without organ or systems involvement: Secondary | ICD-10-CM

## 2016-12-11 LAB — CBC WITH DIFFERENTIAL/PLATELET
Basophils Absolute: 86 cells/uL (ref 0–200)
Basophils Relative: 1 %
Eosinophils Absolute: 172 cells/uL (ref 15–500)
Eosinophils Relative: 2 %
HCT: 36.6 % (ref 35.0–45.0)
Hemoglobin: 11.6 g/dL — ABNORMAL LOW (ref 11.7–15.5)
Lymphocytes Relative: 23 %
Lymphs Abs: 1978 cells/uL (ref 850–3900)
MCH: 32.2 pg (ref 27.0–33.0)
MCHC: 31.7 g/dL — ABNORMAL LOW (ref 32.0–36.0)
MCV: 101.7 fL — ABNORMAL HIGH (ref 80.0–100.0)
MPV: 8.4 fL (ref 7.5–12.5)
Monocytes Absolute: 774 cells/uL (ref 200–950)
Monocytes Relative: 9 %
Neutro Abs: 5590 cells/uL (ref 1500–7800)
Neutrophils Relative %: 65 %
Platelets: 667 10*3/uL — ABNORMAL HIGH (ref 140–400)
RBC: 3.6 MIL/uL — ABNORMAL LOW (ref 3.80–5.10)
RDW: 15.8 % — ABNORMAL HIGH (ref 11.0–15.0)
WBC: 8.6 10*3/uL (ref 3.8–10.8)

## 2016-12-11 NOTE — Patient Instructions (Signed)
Continue same medications and return in 6 months for physical exam. CBC and basic metabolic panel pending. Watch diet.

## 2016-12-11 NOTE — Progress Notes (Signed)
   Subjective:    Patient ID: [name], female    DOB: Aug 07, 1940, 76 y.o.   MRN: [id number]  HPI  76 year old Female for 6 month follow up. Hx CKD,impaired glucose tolerance , HTN, hyperlipidemia and RA. She had shoulder arthroplasty and is feeling much better. Has discontinued her arthritis medications. Says that her rheumatoid arthritis is in remission according to her rheumatologist. Says she feels the best she's felt some time.  We drew CBC and be met today. I want to see what her creatinine is. She has some chronic kidney disease. Hemoglobin A1c is 5.7%. Her lipids have gone up just a bit. She needs to watch her diet. She says she is taking her statin medication. Total cholesterol is 214 and was 174 when checked 6 months ago . LDL cholesterol has increased from 104 to 130.  History of iron deficiency    Review of Systems  Constitutional: Negative.   All other systems reviewed and are negative.      Objective:   Physical Exam Skin warm and dry. Nodes none. Neck is supple without JVD or thyromegaly. Has transmitted murmur bilaterally. Regular rate and rhythm 2/6 systolic ejection murmur Extremities without edema      Assessment & Plan:    Impaired Glucose tolerance-hemoglobin A1c 5.7% hyperlipidemia-needs to watch diet. Will not change dose of statin medication and recheck in 6 months. LDL has increased from 104 to 130  Essential hypertension-stable  History of rheumatoid arthritis-apparently in remission  Status post shoulder arthroplasty-doing well. Needs to continue to work with exercises as she cannot completely raise her right arm up over her head  History of iron deficiency-CBC checked  Chronic kidney disease-basic metabolic panel checked.  Plan: Continue same medications and return in 6 months for physical exam

## 2016-12-12 LAB — BASIC METABOLIC PANEL
BUN: 38 mg/dL — ABNORMAL HIGH (ref 7–25)
CO2: 18 mmol/L — ABNORMAL LOW (ref 20–31)
Calcium: 9.8 mg/dL (ref 8.6–10.4)
Chloride: 105 mmol/L (ref 98–110)
Creat: 1.26 mg/dL — ABNORMAL HIGH (ref 0.60–0.93)
Glucose, Bld: 73 mg/dL (ref 65–99)
Potassium: 4.7 mmol/L (ref 3.5–5.3)
Sodium: 138 mmol/L (ref 135–146)

## 2016-12-13 LAB — FOLATE: Folate: 18.2 ng/mL (ref >5.4)

## 2016-12-13 LAB — VITAMIN B12: Vitamin B-12: 651 pg/mL (ref 200–1100)

## 2016-12-24 ENCOUNTER — Other Ambulatory Visit: Payer: Self-pay | Admitting: Internal Medicine

## 2017-01-08 DIAGNOSIS — E790 Hyperuricemia without signs of inflammatory arthritis and tophaceous disease: Secondary | ICD-10-CM | POA: Diagnosis not present

## 2017-01-08 DIAGNOSIS — M1990 Unspecified osteoarthritis, unspecified site: Secondary | ICD-10-CM | POA: Diagnosis not present

## 2017-01-08 DIAGNOSIS — M059 Rheumatoid arthritis with rheumatoid factor, unspecified: Secondary | ICD-10-CM | POA: Diagnosis not present

## 2017-01-08 DIAGNOSIS — M25571 Pain in right ankle and joints of right foot: Secondary | ICD-10-CM | POA: Diagnosis not present

## 2017-01-08 DIAGNOSIS — M810 Age-related osteoporosis without current pathological fracture: Secondary | ICD-10-CM | POA: Diagnosis not present

## 2017-02-23 ENCOUNTER — Telehealth: Payer: Self-pay

## 2017-02-23 MED ORDER — TRIAMTERENE-HCTZ 75-50 MG PO TABS: 1.0000 | ORAL_TABLET | Freq: Every day | ORAL | 3 refills | Status: DC

## 2017-02-23 MED ORDER — LISINOPRIL 5 MG PO TABS: 5.0000 mg | ORAL_TABLET | Freq: Every day | ORAL | 3 refills | Status: DC

## 2017-02-23 NOTE — Telephone Encounter (Signed)
Received fax from [facility] in regards to a refill on Triamt/HCTZ & Lisinopril for patient. Medication was refilled per Dr. Verlene Mayer request. Sent 1 yr

## 2017-02-26 ENCOUNTER — Ambulatory Visit (INDEPENDENT_AMBULATORY_CARE_PROVIDER_SITE_OTHER): Payer: Medicare Other | Admitting: Internal Medicine

## 2017-02-26 DIAGNOSIS — Z23 Encounter for immunization: Secondary | ICD-10-CM

## 2017-02-26 NOTE — Progress Notes (Signed)
Flu vaccine given.

## 2017-02-26 NOTE — Patient Instructions (Signed)
Flu vaccine given.

## 2017-03-24 ENCOUNTER — Telehealth: Payer: Self-pay

## 2017-03-24 MED ORDER — SIMVASTATIN 20 MG PO TABS: 20.0000 mg | ORAL_TABLET | Freq: Every day | ORAL | 3 refills | Status: DC

## 2017-03-24 NOTE — Telephone Encounter (Signed)
Received fax from [facility] [facility] in regards to a refill on Simvastatin 20mg  for patient. Medication was refilled per Dr. Verlene Mayer request. Sent 1 yr

## 2017-04-23 ENCOUNTER — Encounter: Payer: Self-pay | Admitting: Internal Medicine

## 2017-04-23 ENCOUNTER — Telehealth: Payer: Self-pay | Admitting: Internal Medicine

## 2017-04-23 ENCOUNTER — Ambulatory Visit (INDEPENDENT_AMBULATORY_CARE_PROVIDER_SITE_OTHER): Payer: Medicare Other | Admitting: Internal Medicine

## 2017-04-23 VITALS — BP 120/60 | HR 66 | Temp 98.6°F | Ht 62.0 in | Wt 154.0 lb

## 2017-04-23 DIAGNOSIS — H402222 Chronic angle-closure glaucoma, left eye, moderate stage: Secondary | ICD-10-CM | POA: Diagnosis not present

## 2017-04-23 DIAGNOSIS — H402211 Chronic angle-closure glaucoma, right eye, mild stage: Secondary | ICD-10-CM | POA: Diagnosis not present

## 2017-04-23 DIAGNOSIS — M2669 Other specified disorders of temporomandibular joint: Secondary | ICD-10-CM | POA: Diagnosis not present

## 2017-04-23 DIAGNOSIS — H60502 Unspecified acute noninfective otitis externa, left ear: Secondary | ICD-10-CM | POA: Diagnosis not present

## 2017-04-23 DIAGNOSIS — M26649 Arthritis of unspecified temporomandibular joint: Secondary | ICD-10-CM

## 2017-04-23 DIAGNOSIS — H40052 Ocular hypertension, left eye: Secondary | ICD-10-CM | POA: Diagnosis not present

## 2017-04-23 MED ORDER — LEVOFLOXACIN 250 MG PO TABS: 250.0000 mg | ORAL_TABLET | Freq: Every day | ORAL | 0 refills | Status: DC

## 2017-04-23 MED ORDER — METHYLPREDNISOLONE ACETATE 80 MG/ML IJ SUSP
80.0000 mg | Freq: Once | INTRAMUSCULAR | Status: AC
  Administered 2017-04-23: 80 mg via INTRAMUSCULAR

## 2017-04-23 MED ORDER — NEOMYCIN-POLYMYXIN-HC 1 % OT SOLN: 3.0000 [drp] | Freq: Four times a day (QID) | OTIC | 0 refills | Status: DC

## 2017-04-23 NOTE — Telephone Encounter (Signed)
Yes we can see her

## 2017-04-23 NOTE — Progress Notes (Signed)
   Subjective:    Patient ID: [name], female    DOB: [birth year], 76 y.o.   MRN: [id number]  HPI She has a history of rheumatoid arthritis.  She is complaining of pain in her left TM joint area and says her ear has been itching.  No recent respiratory infection.  Says the pain in her left TM joint area is rather severe.  Denies grinding her teeth.  Also has had some discomfort in her right anterior cervical area.  No fever or chills.    Review of Systems see above     Objective:   Physical Exam Both TMs look to be fairly normal.  I do not see any problem with the external ear canals.  She is slightly tender in her left preauricular area but I do not feel any adenopathy there  Her neck is supple.  Chest is clear.  She seems anxious about the symptoms.      Assessment & Plan:  ?  Seborrhea of ear canal  ?  Left TM joint syndrome  ?  URI  Plan: Cortisporin otic suspension to use an external ear canals 3-4 times daily.  Depo-Medrol 80 mg IM which pain.  Levaquin 250 mg daily for 7 days in case she has a respiratory infection that is just starting.

## 2017-04-23 NOTE — Telephone Encounter (Signed)
Scheduled

## 2017-04-23 NOTE — Telephone Encounter (Signed)
Patient called c/o ear pain for the last three weeks and would like to be seen today. States that it's "itching and there is pain down on the inside." Per patient no dizzines or lightheadedness, just the pain. If patient is not at home, she can be reached on her cell, [phone]. Please advise.

## 2017-04-23 NOTE — Patient Instructions (Signed)
Levaquin 250 mg daily x 7 days. Cortisporin otic suspension in left ear 4 times daily. Depomedol 80 mg IM

## 2017-05-11 IMAGING — CR DG LUMBAR SPINE COMPLETE 4+V
5 series · 5 of 5 positions shown · non-contrast
Comparison: 07/01/2011 lumbar spine radiographs

CLINICAL DATA: Low back pain without known injury x1 week. History
of back surgery in 1961.

EXAM:
LUMBAR SPINE - COMPLETE 4+ VIEW

[l-spine ap]
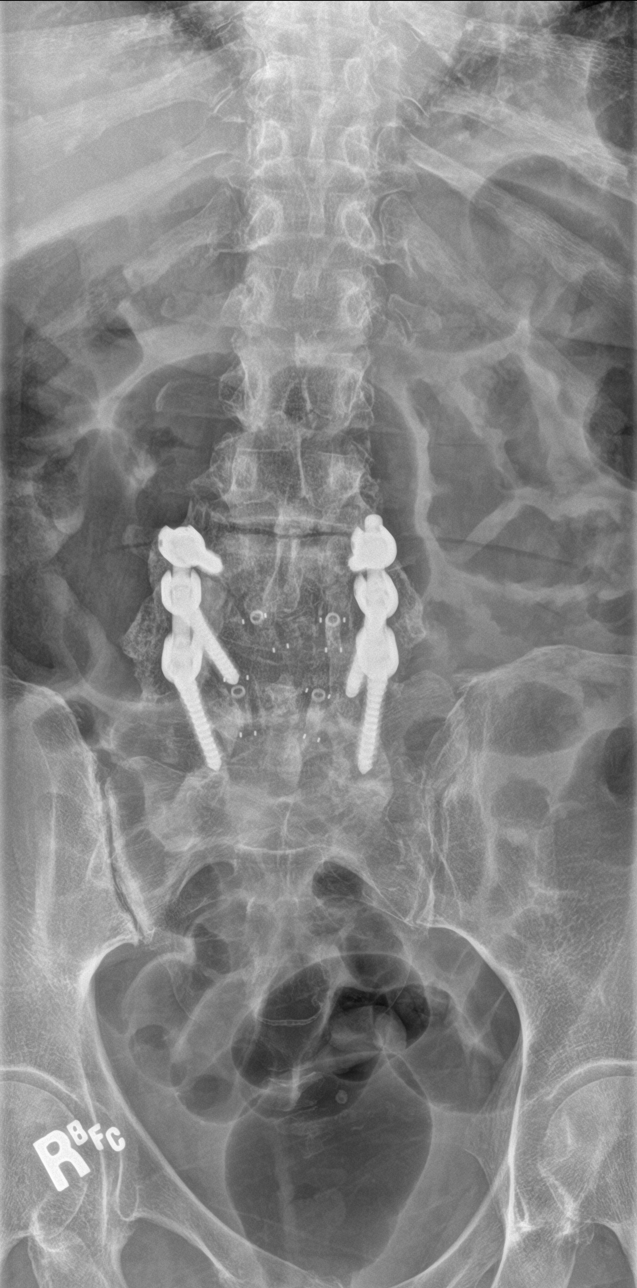

[l-spine obl (1 of 2)]
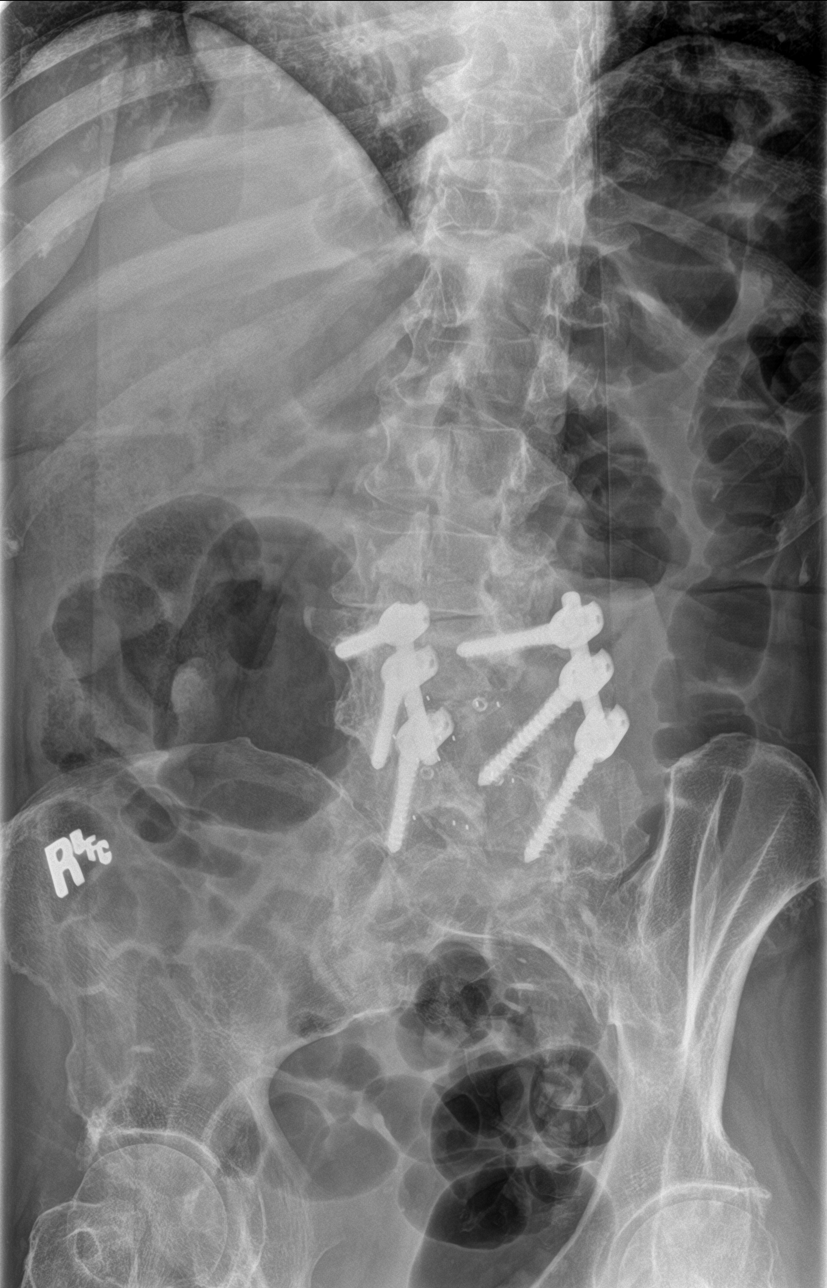

[l-spine obl (2 of 2)]
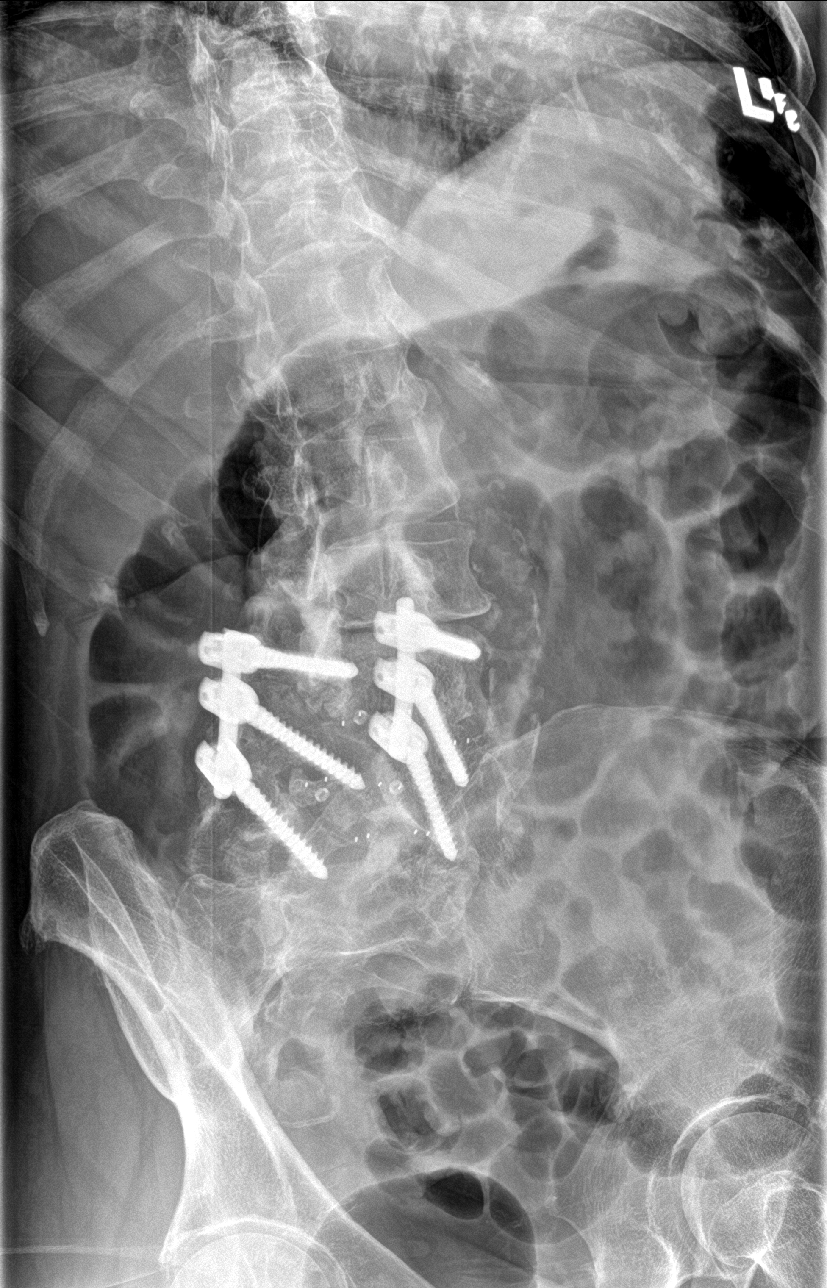

[l-spine lat]
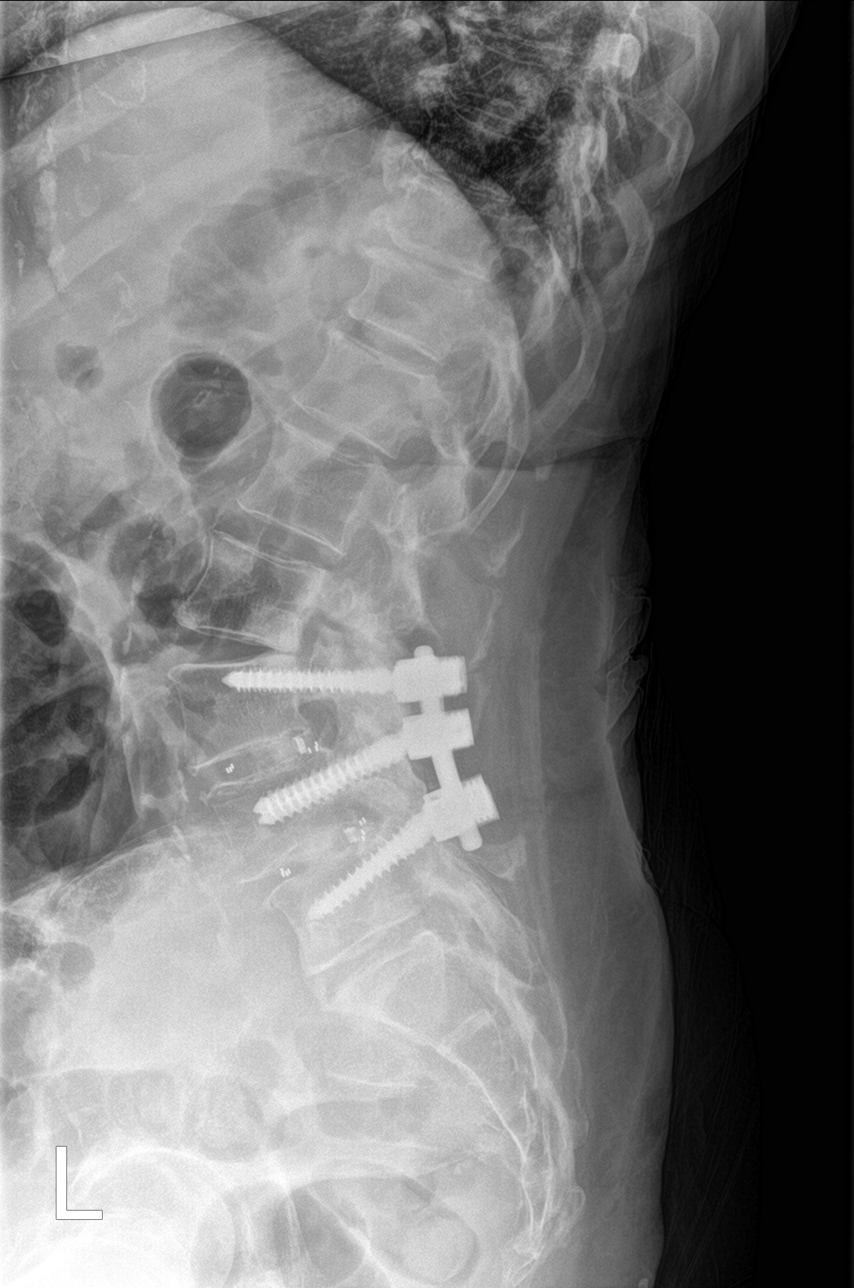

[l-spine spot]
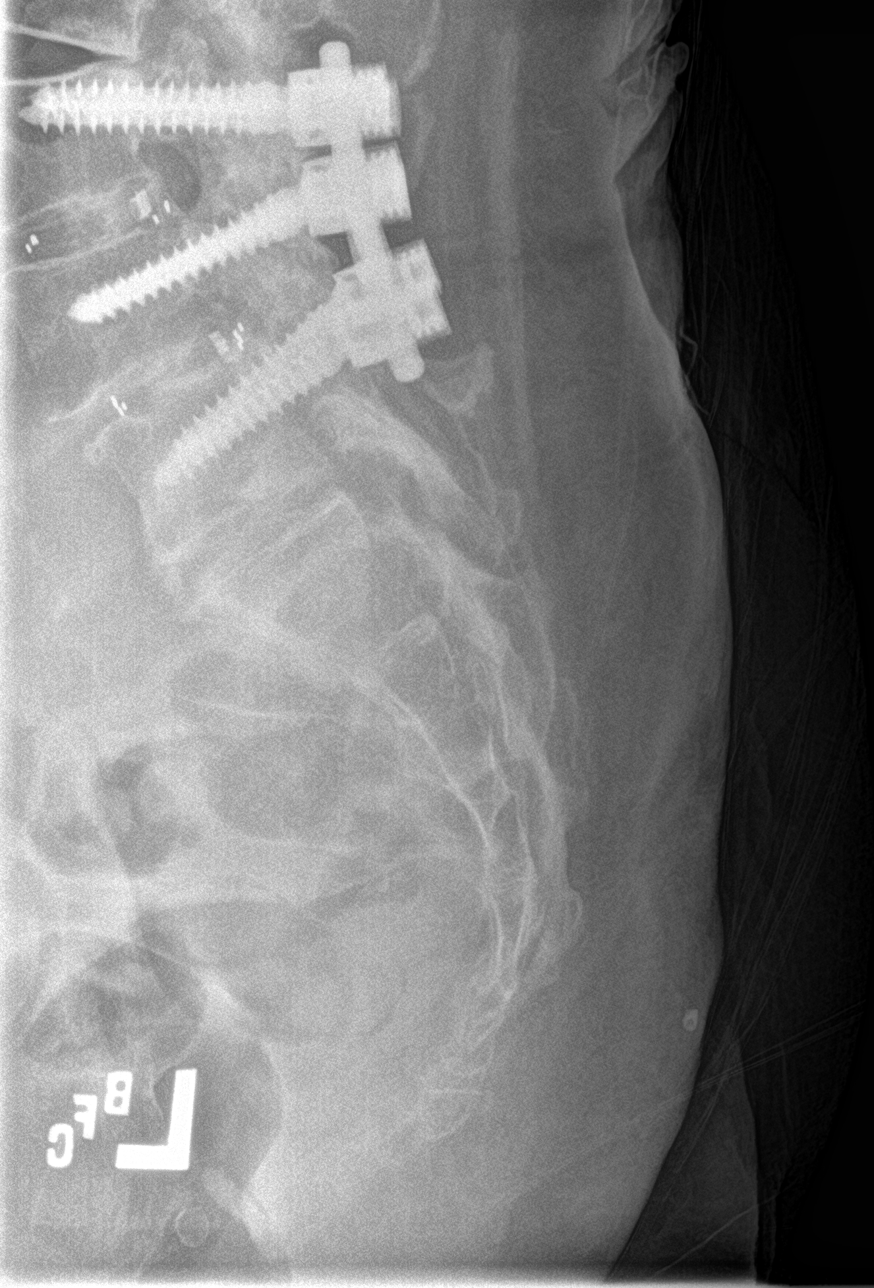

[5 of 5 positions shown; findings below may reference images not displayed]

FINDINGS: Transitional lumbosacral anatomy with the lowest square vertebral
body labeled S1. Numbering system as per prior. Intact fusion
hardware spanning L4 through S1 with interbody blocks in place, at
L4-5 and L5-S1. Progression of posterior disc space narrowing at
L3-4. Stable mild L2-3 disc space narrowing. No acute fracture,
hardware failure nor bone destruction. Aortoiliac atherosclerosis.
IMPRESSION: 1. Progression of degenerative disc disease at L3-4 since prior
exam.
2. Intact L4 through S1 lumbar fusion.
3. Transitional lumbosacral anatomy.

## 2017-05-20 ENCOUNTER — Other Ambulatory Visit: Payer: Self-pay | Admitting: Internal Medicine

## 2017-05-20 DIAGNOSIS — Z1321 Encounter for screening for nutritional disorder: Secondary | ICD-10-CM

## 2017-05-20 DIAGNOSIS — E538 Deficiency of other specified B group vitamins: Secondary | ICD-10-CM

## 2017-05-20 DIAGNOSIS — E785 Hyperlipidemia, unspecified: Secondary | ICD-10-CM

## 2017-05-20 DIAGNOSIS — Z0000 Encounter for general adult medical examination without abnormal findings: Secondary | ICD-10-CM

## 2017-05-20 MED ORDER — LEVOFLOXACIN 250 MG PO TABS: 250.0000 mg | ORAL_TABLET | Freq: Every day | ORAL | 0 refills | Status: DC

## 2017-05-20 MED ORDER — NEOMYCIN-POLYMYXIN-HC 1 % OT SOLN: 3.0000 [drp] | Freq: Four times a day (QID) | OTIC | 0 refills | Status: DC

## 2017-05-20 NOTE — Telephone Encounter (Signed)
Call in Levaquin 250 mg daily x 7 days. Does she still have cortisporin ear drops?

## 2017-05-20 NOTE — Telephone Encounter (Signed)
Patient states that she is now having trouble with her left ear.  Right ear is better, but the fluid is now all in the left ear.  Wants to know if you will call in Rx for the left ear?

## 2017-05-20 NOTE — Telephone Encounter (Signed)
She said she only has about 2-3 drops left.

## 2017-06-04 ENCOUNTER — Other Ambulatory Visit: Payer: Self-pay | Admitting: Internal Medicine

## 2017-06-04 DIAGNOSIS — E119 Type 2 diabetes mellitus without complications: Secondary | ICD-10-CM

## 2017-06-05 ENCOUNTER — Other Ambulatory Visit: Payer: Medicare Other | Admitting: Internal Medicine

## 2017-06-05 DIAGNOSIS — E785 Hyperlipidemia, unspecified: Secondary | ICD-10-CM

## 2017-06-05 DIAGNOSIS — E538 Deficiency of other specified B group vitamins: Secondary | ICD-10-CM | POA: Diagnosis not present

## 2017-06-05 DIAGNOSIS — Z0000 Encounter for general adult medical examination without abnormal findings: Secondary | ICD-10-CM

## 2017-06-05 DIAGNOSIS — E119 Type 2 diabetes mellitus without complications: Secondary | ICD-10-CM

## 2017-06-05 DIAGNOSIS — Z1321 Encounter for screening for nutritional disorder: Secondary | ICD-10-CM | POA: Diagnosis not present

## 2017-06-06 LAB — CBC WITH DIFFERENTIAL/PLATELET
Basophils Absolute: 43 cells/uL (ref 0–200)
Basophils Relative: 0.6 %
Eosinophils Absolute: 166 cells/uL (ref 15–500)
Eosinophils Relative: 2.3 %
HCT: 30.1 % — ABNORMAL LOW (ref 35.0–45.0)
Hemoglobin: 10.2 g/dL — ABNORMAL LOW (ref 11.7–15.5)
Lymphs Abs: 2340 cells/uL (ref 850–3900)
MCH: 32.3 pg (ref 27.0–33.0)
MCHC: 33.9 g/dL (ref 32.0–36.0)
MCV: 95.3 fL (ref 80.0–100.0)
MPV: 8.8 fL (ref 7.5–12.5)
Monocytes Relative: 10.2 %
Neutro Abs: 3917 cells/uL (ref 1500–7800)
Neutrophils Relative %: 54.4 %
Platelets: 600 10*3/uL — ABNORMAL HIGH (ref 140–400)
RBC: 3.16 10*6/uL — ABNORMAL LOW (ref 3.80–5.10)
RDW: 13.6 % (ref 11.0–15.0)
Total Lymphocyte: 32.5 %
WBC mixed population: 734 cells/uL (ref 200–950)
WBC: 7.2 10*3/uL (ref 3.8–10.8)

## 2017-06-06 LAB — LIPID PANEL
Cholesterol: 182 mg/dL (ref <200)
HDL: 62 mg/dL (ref >50)
LDL Cholesterol (Calc): 103 mg/dL (calc) — ABNORMAL HIGH
Non-HDL Cholesterol (Calc): 120 mg/dL (calc) (ref <130)
Total CHOL/HDL Ratio: 2.9 (calc) (ref <5.0)
Triglycerides: 76 mg/dL (ref <150)

## 2017-06-06 LAB — VITAMIN D 25 HYDROXY (VIT D DEFICIENCY, FRACTURES): Vit D, 25-Hydroxy: 37 ng/mL (ref 30–100)

## 2017-06-06 LAB — COMPLETE METABOLIC PANEL WITH GFR
AG Ratio: 1.2 (calc) (ref 1.0–2.5)
ALT: 12 U/L (ref 6–29)
AST: 26 U/L (ref 10–35)
Albumin: 3.8 g/dL (ref 3.6–5.1)
Alkaline phosphatase (APISO): 80 U/L (ref 33–130)
BUN/Creatinine Ratio: 30 (calc) — ABNORMAL HIGH (ref 6–22)
BUN: 29 mg/dL — ABNORMAL HIGH (ref 7–25)
CO2: 25 mmol/L (ref 20–32)
Calcium: 8.9 mg/dL (ref 8.6–10.4)
Chloride: 94 mmol/L — ABNORMAL LOW (ref 98–110)
Creat: 0.97 mg/dL — ABNORMAL HIGH (ref 0.60–0.93)
GFR, Est African American: 65 mL/min/{1.73_m2} (ref >=60)
GFR, Est Non African American: 56 mL/min/{1.73_m2} — ABNORMAL LOW (ref >=60)
Globulin: 3.1 g/dL (calc) (ref 1.9–3.7)
Glucose, Bld: 76 mg/dL (ref 65–99)
Potassium: 4.6 mmol/L (ref 3.5–5.3)
Sodium: 130 mmol/L — ABNORMAL LOW (ref 135–146)
Total Bilirubin: 0.4 mg/dL (ref 0.2–1.2)
Total Protein: 6.9 g/dL (ref 6.1–8.1)

## 2017-06-06 LAB — MICROALBUMIN / CREATININE URINE RATIO
Creatinine, Urine: 39 mg/dL (ref 20–275)
Microalb Creat Ratio: 5 mcg/mg creat (ref <30)
Microalb, Ur: 0.2 mg/dL

## 2017-06-06 LAB — VITAMIN B12: Vitamin B-12: 806 pg/mL (ref 200–1100)

## 2017-06-06 LAB — HEMOGLOBIN A1C
Hgb A1c MFr Bld: 5.9 % of total Hgb — ABNORMAL HIGH (ref <5.7)
Mean Plasma Glucose: 123 (calc)
eAG (mmol/L): 6.8 (calc)

## 2017-06-06 LAB — TSH: TSH: 1.89 mIU/L (ref 0.40–4.50)

## 2017-06-08 ENCOUNTER — Encounter: Payer: Self-pay | Admitting: Internal Medicine

## 2017-06-08 ENCOUNTER — Ambulatory Visit (INDEPENDENT_AMBULATORY_CARE_PROVIDER_SITE_OTHER): Payer: Medicare Other | Admitting: Internal Medicine

## 2017-06-08 VITALS — BP 120/70 | HR 68 | Ht 59.5 in | Wt 150.0 lb

## 2017-06-08 DIAGNOSIS — D638 Anemia in other chronic diseases classified elsewhere: Secondary | ICD-10-CM

## 2017-06-08 DIAGNOSIS — R0989 Other specified symptoms and signs involving the circulatory and respiratory systems: Secondary | ICD-10-CM | POA: Diagnosis not present

## 2017-06-08 DIAGNOSIS — Z8739 Personal history of other diseases of the musculoskeletal system and connective tissue: Secondary | ICD-10-CM | POA: Diagnosis not present

## 2017-06-08 DIAGNOSIS — R7302 Impaired glucose tolerance (oral): Secondary | ICD-10-CM | POA: Diagnosis not present

## 2017-06-08 DIAGNOSIS — Z23 Encounter for immunization: Secondary | ICD-10-CM

## 2017-06-08 DIAGNOSIS — G5603 Carpal tunnel syndrome, bilateral upper limbs: Secondary | ICD-10-CM | POA: Diagnosis not present

## 2017-06-08 DIAGNOSIS — N183 Chronic kidney disease, stage 3 unspecified: Secondary | ICD-10-CM

## 2017-06-08 DIAGNOSIS — Z8639 Personal history of other endocrine, nutritional and metabolic disease: Secondary | ICD-10-CM

## 2017-06-08 DIAGNOSIS — D649 Anemia, unspecified: Secondary | ICD-10-CM | POA: Diagnosis not present

## 2017-06-08 DIAGNOSIS — E785 Hyperlipidemia, unspecified: Secondary | ICD-10-CM | POA: Diagnosis not present

## 2017-06-08 DIAGNOSIS — Z0000 Encounter for general adult medical examination without abnormal findings: Secondary | ICD-10-CM

## 2017-06-08 DIAGNOSIS — I10 Essential (primary) hypertension: Secondary | ICD-10-CM | POA: Diagnosis not present

## 2017-06-08 DIAGNOSIS — E538 Deficiency of other specified B group vitamins: Secondary | ICD-10-CM | POA: Diagnosis not present

## 2017-06-08 DIAGNOSIS — R5383 Other fatigue: Secondary | ICD-10-CM | POA: Diagnosis not present

## 2017-06-08 LAB — POCT URINALYSIS DIPSTICK
Appearance: NORMAL
Bilirubin, UA: NEGATIVE
Blood, UA: NEGATIVE
Glucose, UA: NEGATIVE
Ketones, UA: NEGATIVE
Leukocytes, UA: NEGATIVE
Nitrite, UA: NEGATIVE
Odor: NORMAL
Protein, UA: NEGATIVE
Spec Grav, UA: 1.015 (ref 1.010–1.025)
Urobilinogen, UA: 0.2 E.U./dL
pH, UA: 7.5 (ref 5.0–8.0)

## 2017-06-08 NOTE — Progress Notes (Signed)
Subjective:    Patient ID: [name], female    DOB: Jan 12, 1941, 77 y.o.   MRN: [id number]  HPI 77 year old Black Female for health maintenance exam, Medicare wellness exam and evaluation of medical issues.  Once again, she is anemic and has not been taking iron supplement. Will check anemia studies again. Takes B12 orally. Thinks she take folic acid supplement as well.Is to bring in both bottles from home to see what she has been taking.  She has been evaluated by hematologist for persistent anemia and was found to have anemia of chronic disease felt to be due to chronic kidney disease.  Initially her anemia was noticed after laparoscopic vaginal hysterectomy BSO A and P repair in August 2016.  Dr. Matthew Saras call that to my attention.  Her MCV was 97.7 with a platelet count of 603,000 and normal white blood cell count.  However, in January 2017 she had an iron level of 21 and was placed on iron supplement which improved her iron level to 76 by April 2017.  She was seen by gastroenterologist because of iron deficiency anemia.  She has had a colonoscopy by Dr. Watt Climes in 2014 showing chronic diverticulosis.  On endoscopy in 2014 she had esophageal stricture and gastritis.  She had repeat study February 2017 at [facility] showing small hiatal hernia and chronic gastritis which was biopsied.  Multiple gastric polyps were biopsied.  Chronic PPI change noted.  Pathology was negative for Helicobacter pylori.  No evidence of malignancy.  She is seen at [facility] ophthalmology for glaucoma.  History of hypertension, diabetes mellitus, hyperlipidemia, GE reflux and irritable bowel syndrome.   She underwent a right S1-S2 laminectomy with decompressive lumbar laminectomy L4-L5 and L5-S1 as well as S1-S2 January 2012 by Dr. Sherley Bounds.  Postoperatively she had a pulmonary embolus.  After that she had a viral syndrome and had to be hospitalized in February 2012  Hx rheumatoid arthritis that is stable at this  time.  History of H. pylori gastritis treated in July 2000.  Compression fracture T7 in 2000.  History of abnormal Pap smear in 1995 with colposcopy.  Bilateral tubal ligation 1979.  D&C for menorrhagia in 1989.  Marsupialization of Bartholin cyst 1987.  Social history: She is a widow.  She is retired.  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  She does not smoke or consume alcohol.  Family history: Mother died of an MI with history of hypertension.  Mother was 56 at time of her death.  One brother with history of adult onset diabetes, 5 sisters 1 of whom has diabetes.  One daughter died of an occult malignancy.  One brother in good health.  She has 9 children.  One daughter with history of pulmonary sarcoidosis years ago that resolved.  Patient has history of degenerative arthritis left thumb CMC joint.  History of left wrist de Quervain's tendinitis      Review of Systems has generalized fatigue and arthralgias     Objective:   Physical Exam  Constitutional: She is oriented to person, place, and time. She appears well-developed and well-nourished. No distress.  HENT:  Head: Normocephalic and atraumatic.  Right Ear: External ear normal.  Left Ear: External ear normal.  Mouth/Throat: Oropharynx is clear and moist.  Eyes: Conjunctivae and EOM are normal. Pupils are equal, round, and reactive to light. Right eye exhibits no discharge. Left eye exhibits no discharge. No scleral  icterus.  Neck: Neck supple. No JVD present. No thyromegaly present.  Cardiovascular: Normal rate and regular rhythm.  Murmur heard. Bilateral carotid bruits.  1/6 systolic ejection murmur  Pulmonary/Chest: Effort normal and breath sounds normal. No respiratory distress. She has no wheezes. She has no rales.  Abdominal: Soft. Bowel sounds are normal. She exhibits no distension and no mass. There is no tenderness. There is no rebound and no guarding.    Genitourinary:  Genitourinary Comments: Deferred status post hysterectomy with oophorectomy.  Pap not repeated due to age  Musculoskeletal: She exhibits no edema.  Lymphadenopathy:    She has no cervical adenopathy.  Neurological: She is alert and oriented to person, place, and time. She has normal reflexes. No cranial nerve deficit. Coordination normal.  Skin: Skin is warm and dry. No rash noted. She is not diaphoretic.  Psychiatric: She has a normal mood and affect. Her behavior is normal. Judgment and thought content normal.  Vitals reviewed.         Assessment & Plan:  Normocytic anemia-recurrent.  Hemoglobin 10.2 g and 5 months ago 11.6 g.  Has had evaluation by hematologist.  We will check anemia studies once again.  Has NOT been taking iron. She is taking B12 supplement and B12 level is normal.  She is to bring over-the-counter medication bottles by so we can see exactly what she is taking.  Rheumatoid arthritis-stable and under treatment with rheumatologist  Bilateral carotid bruits-to get carotid Doppler study in the near future  Impaired glucose tolerance-watch diet and exercise.  Hemoglobin A1c 5.9% in 6 months ago was 5.7%  Bone density study-to be obtained at [facility].  Make sure she is taking vitamin D.  Level is 37.  Status post bilateral shoulder arthroplasty the right one was done March 2018 and the left foot was done in 2015  Status post vaginal hysterectomy AMP repair by Dr. Matthew Saras in August 2016  History of bilateral carpal tunnel syndrome diagnosed by Dr. Jaynee Eagles in 2017  Plan: Needs bone density study.  Is to get carotid Doppler study in the near future.  Needs to get back on iron supplement.  Follow-up February 26  Subjective:   Patient presents for Medicare Annual/Subsequent preventive examination.  Review Past Medical/Family/Social: See above   Risk Factors  Current exercise habits: Sedentary Dietary issues discussed: Low-fat low  carbohydrate  Cardiac risk factors: Hyperlipidemia, diabetes mellitus, family history in mother of MI  Depression Screen  (Note: if answer to either of the following is "Yes", a more complete depression screening is indicated)   Over the past two weeks, have you felt down, depressed or hopeless? No  Over the past two weeks, have you felt little interest or pleasure in doing things? No Have you lost interest or pleasure in daily life? No Do you often feel hopeless? No Do you cry easily over simple problems? No   Activities of Daily Living  In your present state of health, do you have any difficulty performing the following activities?:   Driving? No  Managing money? No  Feeding yourself? No  Getting from bed to chair? No  Climbing a flight of stairs? No  Preparing food and eating?: No  Bathing or showering? No  Getting dressed: No  Getting to the toilet? No  Using the toilet:No  Moving around from place to place: No  In the past year have you fallen or had a near fall?:No  Are you sexually active? No  Do you have more than  one partner? No   Hearing Difficulties: No  Do you often ask people to speak up or repeat themselves? No  Do you experience ringing or noises in your ears? No  Do you have difficulty understanding soft or whispered voices? No  Do you feel that you have a problem with memory?  sometimes Do you often misplace items? No    Home Safety:  Do you have a smoke alarm at your residence? Yes Do you have grab bars in the bathroom?  No Do you have throw rugs in your house?  Yes   Cognitive Testing  Alert? Yes Normal Appearance?Yes  Oriented to person? Yes Place? Yes  Time? Yes  Recall of three objects?  not tested Can perform simple calculations?  not to Displays appropriate judgment?Yes  Can read the correct time from a watch face?Yes   List the Names of Other Physician/Practitioners you currently use:  See referral list for the physicians patient is  currently seeing.  Rheumatologist   Review of Systems: See above   Objective:     General appearance: Appears younger than stated age Head: Normocephalic, without obvious abnormality, atraumatic  Eyes: conj clear, EOMi PEERLA  Ears: normal TM's and external ear canals both ears  Nose: Nares normal. Septum midline. Mucosa normal. No drainage or sinus tenderness.  Throat: lips, mucosa, and tongue normal; teeth and gums normal  Neck: no adenopathy, no carotid bruit, no JVD, supple, symmetrical, trachea midline and thyroid not enlarged, symmetric, no tenderness/mass/nodules  No CVA tenderness.  Lungs: clear to auscultation bilaterally  Breasts: normal appearance, no masses or tenderness, top of the pacemaker on left upper chest. Incision well-healed. It is tender.  Heart: regular rate and rhythm, S1, S2 normal, no murmur, click, rub or gallop  Abdomen: soft, non-tender; bowel sounds normal; no masses, no organomegaly  Musculoskeletal: ROM normal in all joints, no crepitus, no deformity, Normal muscle strengthen. Back  is symmetric, no curvature. Skin: Skin color, texture, turgor normal. No rashes or lesions  Lymph nodes: Cervical, supraclavicular, and axillary nodes normal.  Neurologic: CN 2 -12 Normal, Normal symmetric reflexes. Normal coordination and gait  Psych: Alert & Oriented x 3, Mood appear stable.    Assessment:    Annual wellness medicare exam   Plan:    During the course of the visit the patient was educated and counseled about appropriate screening and preventive services including:   Annual mammogram     Patient Instructions (the written plan) was given to the patient.  Medicare Attestation  I have personally reviewed:  The patient's medical and social history  Their use of alcohol, tobacco or illicit drugs  Their current medications and supplements  The patient's functional ability including ADLs,fall risks, home safety risks, cognitive, and hearing and  visual impairment  Diet and physical activities  Evidence for depression or mood disorders  The patient's weight, height, BMI, and visual acuity have been recorded in the chart. I have made referrals, counseling, and provided education to the patient based on review of the above and I have provided the patient with a written personalized care plan for preventive services.

## 2017-06-09 LAB — IRON,TIBC AND FERRITIN PANEL
%SAT: 15 % (calc) (ref 11–50)
Ferritin: 192 ng/mL (ref 20–288)
Iron: 52 ug/dL (ref 45–160)
TIBC: 356 mcg/dL (calc) (ref 250–450)

## 2017-06-09 LAB — FOLATE: Folate: >24 ng/mL

## 2017-06-09 LAB — VITAMIN B12: Vitamin B-12: 879 pg/mL (ref 200–1100)

## 2017-06-10 ENCOUNTER — Ambulatory Visit (HOSPITAL_COMMUNITY)
Admission: RE | Admit: 2017-06-10 | Discharge: 2017-06-10 | Disposition: A | Discharge status: Home / Self Care | Payer: Medicare Other | Source: Ambulatory Visit | Attending: Internal Medicine | Admitting: Internal Medicine

## 2017-06-10 DIAGNOSIS — I6523 Occlusion and stenosis of bilateral carotid arteries: Secondary | ICD-10-CM | POA: Diagnosis not present

## 2017-06-10 DIAGNOSIS — R0989 Other specified symptoms and signs involving the circulatory and respiratory systems: Secondary | ICD-10-CM | POA: Diagnosis not present

## 2017-06-10 NOTE — Progress Notes (Signed)
*  PRELIMINARY RESULTS* Vascular Ultrasound Carotid Duplex (Doppler) has been completed.  Preliminary findings: Bilateral 1-39% ICA stenosis, antegrade vertebral flow.  Tortuous vessels bilaterally Lt>Rt.   Myrtie Cruise Jhonatan Lomeli 06/10/2017, 1:30 PM

## 2017-06-16 ENCOUNTER — Other Ambulatory Visit: Payer: Self-pay | Admitting: Internal Medicine

## 2017-06-17 DIAGNOSIS — Z780 Asymptomatic menopausal state: Secondary | ICD-10-CM | POA: Diagnosis not present

## 2017-06-17 DIAGNOSIS — M8589 Other specified disorders of bone density and structure, multiple sites: Secondary | ICD-10-CM | POA: Diagnosis not present

## 2017-06-22 ENCOUNTER — Telehealth: Payer: Self-pay | Admitting: Internal Medicine

## 2017-06-22 ENCOUNTER — Other Ambulatory Visit: Payer: Self-pay | Admitting: Internal Medicine

## 2017-06-22 NOTE — Telephone Encounter (Signed)
Spoke with pt. She is to call pharmacy and send refill to [place]. We do not have it on file this specific brand of iron

## 2017-06-22 NOTE — Telephone Encounter (Signed)
Fusion Plus iron supplement.  She is requesting a refill on this medication for iron supplement.  She doesn't think that Dr. Watt Climes is filling this any longer.  She is requesting a refill on this medication.    Pharmacy:  [facility] [email or web address]  Phone # [phone]  Thank you.

## 2017-06-24 ENCOUNTER — Other Ambulatory Visit: Payer: Self-pay

## 2017-06-24 DIAGNOSIS — D649 Anemia, unspecified: Secondary | ICD-10-CM

## 2017-06-25 ENCOUNTER — Encounter: Payer: Self-pay | Admitting: Internal Medicine

## 2017-07-21 ENCOUNTER — Ambulatory Visit (INDEPENDENT_AMBULATORY_CARE_PROVIDER_SITE_OTHER): Payer: Medicare Other | Admitting: Internal Medicine

## 2017-07-21 ENCOUNTER — Encounter: Payer: Self-pay | Admitting: Internal Medicine

## 2017-07-21 VITALS — BP 120/70 | HR 66 | Ht 59.5 in | Wt 152.0 lb

## 2017-07-21 DIAGNOSIS — Z8739 Personal history of other diseases of the musculoskeletal system and connective tissue: Secondary | ICD-10-CM

## 2017-07-21 DIAGNOSIS — R0989 Other specified symptoms and signs involving the circulatory and respiratory systems: Secondary | ICD-10-CM | POA: Diagnosis not present

## 2017-07-21 DIAGNOSIS — Z862 Personal history of diseases of the blood and blood-forming organs and certain disorders involving the immune mechanism: Secondary | ICD-10-CM | POA: Diagnosis not present

## 2017-07-21 DIAGNOSIS — N183 Chronic kidney disease, stage 3 unspecified: Secondary | ICD-10-CM

## 2017-07-21 DIAGNOSIS — D649 Anemia, unspecified: Secondary | ICD-10-CM | POA: Diagnosis not present

## 2017-07-21 DIAGNOSIS — I10 Essential (primary) hypertension: Secondary | ICD-10-CM

## 2017-07-21 DIAGNOSIS — D638 Anemia in other chronic diseases classified elsewhere: Secondary | ICD-10-CM | POA: Diagnosis not present

## 2017-07-21 LAB — CBC WITH DIFFERENTIAL/PLATELET
Basophils Absolute: 50 cells/uL (ref 0–200)
Basophils Relative: 0.6 %
Eosinophils Absolute: 176 cells/uL (ref 15–500)
Eosinophils Relative: 2.1 %
HCT: 30.6 % — ABNORMAL LOW (ref 35.0–45.0)
Hemoglobin: 10.2 g/dL — ABNORMAL LOW (ref 11.7–15.5)
Lymphs Abs: 2604 cells/uL (ref 850–3900)
MCH: 32.1 pg (ref 27.0–33.0)
MCHC: 33.3 g/dL (ref 32.0–36.0)
MCV: 96.2 fL (ref 80.0–100.0)
MPV: 8.9 fL (ref 7.5–12.5)
Monocytes Relative: 11.2 %
Neutro Abs: 4628 cells/uL (ref 1500–7800)
Neutrophils Relative %: 55.1 %
Platelets: 631 10*3/uL — ABNORMAL HIGH (ref 140–400)
RBC: 3.18 10*6/uL — ABNORMAL LOW (ref 3.80–5.10)
RDW: 14 % (ref 11.0–15.0)
Total Lymphocyte: 31 %
WBC mixed population: 941 cells/uL (ref 200–950)
WBC: 8.4 10*3/uL (ref 3.8–10.8)

## 2017-07-21 LAB — IRON,TIBC AND FERRITIN PANEL
%SAT: 20 % (calc) (ref 11–50)
Ferritin: 161 ng/mL (ref 20–288)
Iron: 65 ug/dL (ref 45–160)
TIBC: 329 mcg/dL (calc) (ref 250–450)

## 2017-07-21 MED ORDER — FUSION PLUS PO CAPS: 1.0000 | ORAL_CAPSULE | Freq: Every day | ORAL | 99 refills | Status: DC

## 2017-07-21 NOTE — Patient Instructions (Addendum)
Continue iron supplementation.  Follow-up appointment in 6 months recheck will be done in July.  Labs drawn and pending.

## 2017-07-21 NOTE — Progress Notes (Addendum)
   Subjective:    Patient ID: [name], female    DOB: [birth year], 77 y.o.   MRN: [id number]  HPI She is here today to follow-up on iron deficiency anemia.  She is taking her iron supplement.  She feels well with no new complaints.  Chart reviewed.  In January she had bilateral carotid Doppler studies showing bilateral ICA stenosis 1-39%.  Tortuous vessels noted left greater than right.  Her iron level was 52 in January and previously a year ago had been 43.  She had quit taking her iron supplement because it was a bit expensive.  However she tolerates it and seems to need it based on her response to it.  Dr. Jana Hakim noted in his consultation in May 2017 the chronic inflammation would result in iron stores being sequestered in a way that was not accessible    Review of Systems no new complaints     Objective:   Physical Exam  Not examined.  Labs drawn and pending.      Assessment & Plan:  Anemia of chronic disease  History of iron deficiency   Rheumatoid arthritis  Bilateral carotid bruits status post carotid Dopplers recently showing insignificant stenosis  Chronic kidney disease stage III  Essential hypertension-stable  Plan: Follow-up in July for 77-month recheck with lab work

## 2017-09-21 ENCOUNTER — Other Ambulatory Visit: Payer: Self-pay | Admitting: Internal Medicine

## 2017-09-28 DIAGNOSIS — M79641 Pain in right hand: Secondary | ICD-10-CM | POA: Diagnosis not present

## 2017-09-28 DIAGNOSIS — M1990 Unspecified osteoarthritis, unspecified site: Secondary | ICD-10-CM | POA: Diagnosis not present

## 2017-09-28 DIAGNOSIS — M25562 Pain in left knee: Secondary | ICD-10-CM | POA: Diagnosis not present

## 2017-09-28 DIAGNOSIS — M059 Rheumatoid arthritis with rheumatoid factor, unspecified: Secondary | ICD-10-CM | POA: Diagnosis not present

## 2017-09-28 DIAGNOSIS — M810 Age-related osteoporosis without current pathological fracture: Secondary | ICD-10-CM | POA: Diagnosis not present

## 2017-09-28 DIAGNOSIS — M25571 Pain in right ankle and joints of right foot: Secondary | ICD-10-CM | POA: Diagnosis not present

## 2017-09-28 DIAGNOSIS — E790 Hyperuricemia without signs of inflammatory arthritis and tophaceous disease: Secondary | ICD-10-CM | POA: Diagnosis not present

## 2017-09-28 DIAGNOSIS — M25561 Pain in right knee: Secondary | ICD-10-CM | POA: Diagnosis not present

## 2017-09-28 DIAGNOSIS — M79642 Pain in left hand: Secondary | ICD-10-CM | POA: Diagnosis not present

## 2017-10-14 ENCOUNTER — Encounter: Payer: Self-pay | Admitting: Internal Medicine

## 2017-10-14 DIAGNOSIS — Z1231 Encounter for screening mammogram for malignant neoplasm of breast: Secondary | ICD-10-CM | POA: Diagnosis not present

## 2017-11-18 DIAGNOSIS — E790 Hyperuricemia without signs of inflammatory arthritis and tophaceous disease: Secondary | ICD-10-CM | POA: Diagnosis not present

## 2017-11-18 DIAGNOSIS — Z23 Encounter for immunization: Secondary | ICD-10-CM | POA: Diagnosis not present

## 2017-11-18 DIAGNOSIS — M059 Rheumatoid arthritis with rheumatoid factor, unspecified: Secondary | ICD-10-CM | POA: Diagnosis not present

## 2017-11-18 DIAGNOSIS — M1990 Unspecified osteoarthritis, unspecified site: Secondary | ICD-10-CM | POA: Diagnosis not present

## 2017-11-18 DIAGNOSIS — M810 Age-related osteoporosis without current pathological fracture: Secondary | ICD-10-CM | POA: Diagnosis not present

## 2017-11-18 DIAGNOSIS — M25571 Pain in right ankle and joints of right foot: Secondary | ICD-10-CM | POA: Diagnosis not present

## 2017-12-02 DIAGNOSIS — M810 Age-related osteoporosis without current pathological fracture: Secondary | ICD-10-CM | POA: Diagnosis not present

## 2017-12-02 DIAGNOSIS — M25571 Pain in right ankle and joints of right foot: Secondary | ICD-10-CM | POA: Diagnosis not present

## 2017-12-02 DIAGNOSIS — M059 Rheumatoid arthritis with rheumatoid factor, unspecified: Secondary | ICD-10-CM | POA: Diagnosis not present

## 2017-12-02 DIAGNOSIS — E790 Hyperuricemia without signs of inflammatory arthritis and tophaceous disease: Secondary | ICD-10-CM | POA: Diagnosis not present

## 2017-12-02 DIAGNOSIS — M1990 Unspecified osteoarthritis, unspecified site: Secondary | ICD-10-CM | POA: Diagnosis not present

## 2017-12-07 ENCOUNTER — Other Ambulatory Visit: Payer: Self-pay | Admitting: Internal Medicine

## 2017-12-07 DIAGNOSIS — Z79899 Other long term (current) drug therapy: Secondary | ICD-10-CM

## 2017-12-07 DIAGNOSIS — Z5181 Encounter for therapeutic drug level monitoring: Secondary | ICD-10-CM

## 2017-12-07 DIAGNOSIS — D649 Anemia, unspecified: Secondary | ICD-10-CM

## 2017-12-07 DIAGNOSIS — E119 Type 2 diabetes mellitus without complications: Secondary | ICD-10-CM

## 2017-12-07 DIAGNOSIS — E785 Hyperlipidemia, unspecified: Secondary | ICD-10-CM

## 2017-12-08 ENCOUNTER — Other Ambulatory Visit: Payer: Self-pay | Admitting: Oncology

## 2017-12-08 ENCOUNTER — Other Ambulatory Visit: Payer: Self-pay | Admitting: Internal Medicine

## 2017-12-11 ENCOUNTER — Other Ambulatory Visit: Payer: Self-pay | Admitting: Internal Medicine

## 2017-12-11 ENCOUNTER — Ambulatory Visit: Payer: Self-pay | Admitting: Internal Medicine

## 2017-12-11 DIAGNOSIS — D649 Anemia, unspecified: Secondary | ICD-10-CM | POA: Diagnosis not present

## 2017-12-11 DIAGNOSIS — I10 Essential (primary) hypertension: Secondary | ICD-10-CM | POA: Diagnosis not present

## 2017-12-11 DIAGNOSIS — E785 Hyperlipidemia, unspecified: Secondary | ICD-10-CM | POA: Diagnosis not present

## 2017-12-11 DIAGNOSIS — N183 Chronic kidney disease, stage 3 (moderate): Secondary | ICD-10-CM | POA: Diagnosis not present

## 2017-12-11 DIAGNOSIS — E119 Type 2 diabetes mellitus without complications: Secondary | ICD-10-CM | POA: Diagnosis not present

## 2017-12-11 NOTE — Addendum Note (Signed)
Addended by: Mady Haagensen on: 12/11/2017 04:19 PM   Modules accepted: Orders

## 2017-12-12 LAB — LIPID PANEL
Cholesterol: 197 mg/dL (ref <200)
HDL: 61 mg/dL (ref >50)
LDL Cholesterol (Calc): 119 mg/dL (calc) — ABNORMAL HIGH
Non-HDL Cholesterol (Calc): 136 mg/dL (calc) — ABNORMAL HIGH (ref <130)
Total CHOL/HDL Ratio: 3.2 (calc) (ref <5.0)
Triglycerides: 75 mg/dL (ref <150)

## 2017-12-12 LAB — BASIC METABOLIC PANEL WITH GFR
BUN/Creatinine Ratio: 19 (calc) (ref 6–22)
BUN: 18 mg/dL (ref 7–25)
CO2: 24 mmol/L (ref 20–32)
Calcium: 9.8 mg/dL (ref 8.6–10.4)
Chloride: 90 mmol/L — ABNORMAL LOW (ref 98–110)
Creat: 0.96 mg/dL — ABNORMAL HIGH (ref 0.60–0.93)
GFR, Est African American: 65 mL/min/{1.73_m2} (ref >=60)
GFR, Est Non African American: 56 mL/min/{1.73_m2} — ABNORMAL LOW (ref >=60)
Glucose, Bld: 86 mg/dL (ref 65–99)
Potassium: 4.7 mmol/L (ref 3.5–5.3)
Sodium: 126 mmol/L — ABNORMAL LOW (ref 135–146)

## 2017-12-12 LAB — CBC WITH DIFFERENTIAL/PLATELET
Basophils Absolute: 69 cells/uL (ref 0–200)
Basophils Relative: 0.7 %
Eosinophils Absolute: 99 cells/uL (ref 15–500)
Eosinophils Relative: 1 %
HCT: 33.1 % — ABNORMAL LOW (ref 35.0–45.0)
Hemoglobin: 11.1 g/dL — ABNORMAL LOW (ref 11.7–15.5)
Lymphs Abs: 2693 cells/uL (ref 850–3900)
MCH: 32.6 pg (ref 27.0–33.0)
MCHC: 33.5 g/dL (ref 32.0–36.0)
MCV: 97.4 fL (ref 80.0–100.0)
MPV: 8.2 fL (ref 7.5–12.5)
Monocytes Relative: 8.9 %
Neutro Abs: 6158 cells/uL (ref 1500–7800)
Neutrophils Relative %: 62.2 %
Platelets: 743 10*3/uL — ABNORMAL HIGH (ref 140–400)
RBC: 3.4 10*6/uL — ABNORMAL LOW (ref 3.80–5.10)
RDW: 14.1 % (ref 11.0–15.0)
Total Lymphocyte: 27.2 %
WBC mixed population: 881 cells/uL (ref 200–950)
WBC: 9.9 10*3/uL (ref 3.8–10.8)

## 2017-12-12 LAB — HEPATIC FUNCTION PANEL
AG Ratio: 1.4 (calc) (ref 1.0–2.5)
ALT: 15 U/L (ref 6–29)
AST: 28 U/L (ref 10–35)
Albumin: 4.1 g/dL (ref 3.6–5.1)
Alkaline phosphatase (APISO): 75 U/L (ref 33–130)
Bilirubin, Direct: 0.1 mg/dL (ref 0.0–0.2)
Globulin: 3 g/dL (calc) (ref 1.9–3.7)
Indirect Bilirubin: 0.4 mg/dL (calc) (ref 0.2–1.2)
Total Bilirubin: 0.5 mg/dL (ref 0.2–1.2)
Total Protein: 7.1 g/dL (ref 6.1–8.1)

## 2017-12-12 LAB — MICROALBUMIN, URINE: Microalb, Ur: 0.2 mg/dL

## 2017-12-12 LAB — HEMOGLOBIN A1C
Hgb A1c MFr Bld: 6.1 % of total Hgb — ABNORMAL HIGH (ref <5.7)
Mean Plasma Glucose: 128 (calc)
eAG (mmol/L): 7.1 (calc)

## 2017-12-14 ENCOUNTER — Telehealth: Payer: Self-pay | Admitting: Internal Medicine

## 2017-12-14 ENCOUNTER — Encounter: Payer: Self-pay | Admitting: Internal Medicine

## 2017-12-14 DIAGNOSIS — M810 Age-related osteoporosis without current pathological fracture: Secondary | ICD-10-CM | POA: Diagnosis not present

## 2017-12-14 DIAGNOSIS — M059 Rheumatoid arthritis with rheumatoid factor, unspecified: Secondary | ICD-10-CM | POA: Diagnosis not present

## 2017-12-14 DIAGNOSIS — E790 Hyperuricemia without signs of inflammatory arthritis and tophaceous disease: Secondary | ICD-10-CM | POA: Diagnosis not present

## 2017-12-14 DIAGNOSIS — M1990 Unspecified osteoarthritis, unspecified site: Secondary | ICD-10-CM | POA: Diagnosis not present

## 2017-12-14 DIAGNOSIS — M25562 Pain in left knee: Secondary | ICD-10-CM | POA: Diagnosis not present

## 2017-12-14 NOTE — Telephone Encounter (Signed)
Patient went to [facility] on [address] to have her labs done last week.  Attempted to reach patient today to schedule her 6 month recheck done to f/u lab work.  Her home # [phone] doesn't even ring; it goes straight to a voice message that says patient cannot be reached because this voice mail has not been set up at this time.  No other number to reach patient.  Sending a letter to the patient's home address to contact our office to set up her 6 month recheck appointment to go over her lab work and that I had been unable to reach her due to the voice mail issue with her home phone number.    Waiting to hear from patient.

## 2017-12-15 ENCOUNTER — Telehealth: Payer: Self-pay | Admitting: Oncology

## 2017-12-15 NOTE — Telephone Encounter (Signed)
Trid to call regarding 8/9 per andrea patients voicemail is not set up

## 2017-12-29 ENCOUNTER — Encounter: Payer: Self-pay | Admitting: Internal Medicine

## 2017-12-29 ENCOUNTER — Ambulatory Visit (INDEPENDENT_AMBULATORY_CARE_PROVIDER_SITE_OTHER): Payer: Medicare Other | Admitting: Internal Medicine

## 2017-12-29 VITALS — BP 120/68 | HR 78 | Ht 59.5 in | Wt 144.0 lb

## 2017-12-29 DIAGNOSIS — D638 Anemia in other chronic diseases classified elsewhere: Secondary | ICD-10-CM | POA: Diagnosis not present

## 2017-12-29 DIAGNOSIS — N183 Chronic kidney disease, stage 3 unspecified: Secondary | ICD-10-CM

## 2017-12-29 DIAGNOSIS — E7800 Pure hypercholesterolemia, unspecified: Secondary | ICD-10-CM

## 2017-12-29 DIAGNOSIS — I10 Essential (primary) hypertension: Secondary | ICD-10-CM

## 2017-12-29 DIAGNOSIS — R0989 Other specified symptoms and signs involving the circulatory and respiratory systems: Secondary | ICD-10-CM

## 2017-12-29 DIAGNOSIS — Z8739 Personal history of other diseases of the musculoskeletal system and connective tissue: Secondary | ICD-10-CM

## 2017-12-29 DIAGNOSIS — E785 Hyperlipidemia, unspecified: Secondary | ICD-10-CM | POA: Diagnosis not present

## 2017-12-29 DIAGNOSIS — R7302 Impaired glucose tolerance (oral): Secondary | ICD-10-CM

## 2017-12-29 DIAGNOSIS — G4762 Sleep related leg cramps: Secondary | ICD-10-CM

## 2017-12-29 MED ORDER — GABAPENTIN 300 MG PO CAPS: ORAL_CAPSULE | ORAL | 3 refills | Status: DC

## 2017-12-29 NOTE — Progress Notes (Signed)
   Subjective:    Patient ID: [name], female    DOB: [birth year], 77 y.o.   MRN: [id number]  HPI 77 year old Female for 6 month recheck. Hx DM, HTN, nocturnal leg cramps, iron deficiency, chronic kidney disease, hyperlipidemia, rheumatoid arthritis.  Bone density study done Spring 2019 at [facility]. Mammogram done in May.  Eye exam this month 01/04/18 at Dr. Herbert Deaner.  Blood pressure is stable at 120/68.  BMI 28.60.  Weight 144 pounds.  Does not get a lot of exercise due to rheumatoid arthritis.  Has recently moved in with her daughter, Mateo Flow.  Long-standing history of iron to see previously seen by Dr. Jana Hakim.  Hemoglobin is stable at 11.1 g.  Platelets elevated at 743,000 likely inflammatory reaction from rheumatoid arthritis.  LDL is elevated at 119 and previously was 103 in January.  History of elevated creatinine at 1.261-year ago but creatinine is now normal.  Sodium slightly low at 126 and will be monitored and hemoglobin A1c is 6.1%.  Her iron levels were checked in February and were normal.  These were ordered today but she cannot get to lab at [facility].  It is difficult to perform venipuncture on her due to lack of good veins.  Continue iron supplementation.      Review of Systems looks well and has no new complaints     Objective:   Physical Exam Skin warm and dry.  Nodes none.  Chest clear.  Cardiac exam regular rate and rhythm normal S1 and S2.  Extremities without edema       Assessment & Plan:  Essential hypertension-stable on current regimen  History of iron deficiency-continue iron supplement  Rheumatoid arthritis  Nocturnal leg cramps-trial of gabapentin  Hyperlipidemia-stable on current regimen.  Watch diet  Impaired glucose tolerance-continue to watch diet  History of anemia of chronic disease  History of chronic kidney disease but creatinine is now normal  Pure hypercholesterolemia  History of bilateral carotid bruits  unchanged  Plan: Return in 6 months for physical exam and continue same medications.

## 2017-12-29 NOTE — Patient Instructions (Signed)
Continue same meds. Watch diet. RTC in 6 months. Gabapentin for leg cramps.

## 2017-12-30 NOTE — Progress Notes (Signed)
No show

## 2018-01-01 ENCOUNTER — Inpatient Hospital Stay: Payer: Medicare Other

## 2018-01-01 ENCOUNTER — Inpatient Hospital Stay: Payer: Medicare Other | Attending: Oncology | Admitting: Oncology

## 2018-01-04 ENCOUNTER — Encounter: Payer: Self-pay | Admitting: Internal Medicine

## 2018-01-04 DIAGNOSIS — H402222 Chronic angle-closure glaucoma, left eye, moderate stage: Secondary | ICD-10-CM | POA: Diagnosis not present

## 2018-01-04 DIAGNOSIS — H402211 Chronic angle-closure glaucoma, right eye, mild stage: Secondary | ICD-10-CM | POA: Diagnosis not present

## 2018-01-04 DIAGNOSIS — H35033 Hypertensive retinopathy, bilateral: Secondary | ICD-10-CM | POA: Diagnosis not present

## 2018-01-04 DIAGNOSIS — Z961 Presence of intraocular lens: Secondary | ICD-10-CM | POA: Diagnosis not present

## 2018-01-04 LAB — HM DIABETES EYE EXAM

## 2018-01-05 ENCOUNTER — Encounter: Payer: Self-pay | Admitting: Internal Medicine

## 2018-01-11 ENCOUNTER — Other Ambulatory Visit: Payer: Self-pay

## 2018-01-11 MED ORDER — OMEPRAZOLE 20 MG PO CPDR: 20.0000 mg | DELAYED_RELEASE_CAPSULE | Freq: Every day | ORAL | 3 refills | Status: DC

## 2018-01-11 NOTE — Telephone Encounter (Signed)
Patient called to request a refill on omeprazole.

## 2018-01-18 ENCOUNTER — Other Ambulatory Visit: Payer: Self-pay | Admitting: Internal Medicine

## 2018-01-20 DIAGNOSIS — M810 Age-related osteoporosis without current pathological fracture: Secondary | ICD-10-CM | POA: Diagnosis not present

## 2018-01-20 DIAGNOSIS — M1990 Unspecified osteoarthritis, unspecified site: Secondary | ICD-10-CM | POA: Diagnosis not present

## 2018-01-20 DIAGNOSIS — M059 Rheumatoid arthritis with rheumatoid factor, unspecified: Secondary | ICD-10-CM | POA: Diagnosis not present

## 2018-01-20 DIAGNOSIS — E790 Hyperuricemia without signs of inflammatory arthritis and tophaceous disease: Secondary | ICD-10-CM | POA: Diagnosis not present

## 2018-01-20 DIAGNOSIS — D473 Essential (hemorrhagic) thrombocythemia: Secondary | ICD-10-CM | POA: Diagnosis not present

## 2018-01-26 ENCOUNTER — Other Ambulatory Visit: Payer: Self-pay | Admitting: Internal Medicine

## 2018-01-27 ENCOUNTER — Other Ambulatory Visit: Payer: Self-pay

## 2018-01-27 MED ORDER — FUSION PLUS PO CAPS: 1.0000 | ORAL_CAPSULE | Freq: Every day | ORAL | 99 refills | Status: DC

## 2018-02-08 DIAGNOSIS — M059 Rheumatoid arthritis with rheumatoid factor, unspecified: Secondary | ICD-10-CM | POA: Diagnosis not present

## 2018-02-22 ENCOUNTER — Other Ambulatory Visit: Payer: Self-pay | Admitting: Internal Medicine

## 2018-02-22 DIAGNOSIS — M0579 Rheumatoid arthritis with rheumatoid factor of multiple sites without organ or systems involvement: Secondary | ICD-10-CM | POA: Diagnosis not present

## 2018-02-22 DIAGNOSIS — M059 Rheumatoid arthritis with rheumatoid factor, unspecified: Secondary | ICD-10-CM | POA: Diagnosis not present

## 2018-02-25 ENCOUNTER — Ambulatory Visit (INDEPENDENT_AMBULATORY_CARE_PROVIDER_SITE_OTHER): Payer: Medicare Other | Admitting: Internal Medicine

## 2018-02-25 DIAGNOSIS — Z23 Encounter for immunization: Secondary | ICD-10-CM

## 2018-02-25 NOTE — Patient Instructions (Signed)
Patient received a flu vaccine IM L deltoid, AV, CMA  

## 2018-02-25 NOTE — Progress Notes (Signed)
Flu vaccine given.

## 2018-03-05 ENCOUNTER — Encounter: Payer: Self-pay | Admitting: Internal Medicine

## 2018-03-05 ENCOUNTER — Ambulatory Visit (INDEPENDENT_AMBULATORY_CARE_PROVIDER_SITE_OTHER): Payer: Medicare Other | Admitting: Internal Medicine

## 2018-03-05 VITALS — BP 130/80 | HR 67 | Temp 98.2°F | Ht 59.5 in | Wt 145.0 lb

## 2018-03-05 DIAGNOSIS — F411 Generalized anxiety disorder: Secondary | ICD-10-CM | POA: Diagnosis not present

## 2018-03-05 DIAGNOSIS — G4709 Other insomnia: Secondary | ICD-10-CM | POA: Diagnosis not present

## 2018-03-05 DIAGNOSIS — R110 Nausea: Secondary | ICD-10-CM

## 2018-03-05 DIAGNOSIS — F439 Reaction to severe stress, unspecified: Secondary | ICD-10-CM | POA: Diagnosis not present

## 2018-03-05 MED ORDER — ALPRAZOLAM 0.5 MG PO TABS: ORAL_TABLET | ORAL | 1 refills | Status: DC

## 2018-03-08 DIAGNOSIS — M059 Rheumatoid arthritis with rheumatoid factor, unspecified: Secondary | ICD-10-CM | POA: Diagnosis not present

## 2018-03-22 NOTE — Patient Instructions (Signed)
Take Xanax 1/2 tablet in the morning 1/2 tablet before lunch and 1 whole tablet at bedtime.  Continue with PPI medication.  Call if not better in 7 to 10 days or sooner if worse

## 2018-03-22 NOTE — Progress Notes (Signed)
   Subjective:    Patient ID: DHRUVI CRENSHAW, female    DOB: 07/18/1940, 77 y.o.   MRN: [id number]  HPI 77 year old Female with initial complaint of reflux saying omeprazole was not working for her.  With further conversation, it seems that she has had some vague nausea abdominal pain insomnia and anxiety.  There is some situational stress with 1 of her family members that she is concerned about.  It seems to have upset her and that may be affecting her GI tract as well as causing anxiety and insomnia.  She has a long-standing history of anxiety.    Review of Systems no other complaints     Objective:   Physical Exam  Abdomen is soft nondistended without hepatosplenomegaly masses or significant tenderness      Assessment & Plan:  My impression is that the main issue is a situational stress in the family causing her abdominal pain nausea insomnia and anxiety  Plan: Patient was advised to take Xanax 0.5 mg 1/2 tablet in the morning 1/2 tablet at lunch and then 1 whole tablet at bedtime.  Prescribed #60 with 1 refill.  Call if not improving in 7 to 10 days or sooner if worse.  15 minutes spent with patient

## 2018-03-24 ENCOUNTER — Telehealth: Payer: Self-pay | Admitting: Internal Medicine

## 2018-03-24 MED ORDER — OMEPRAZOLE 20 MG PO CPDR: 20.0000 mg | DELAYED_RELEASE_CAPSULE | Freq: Every day | ORAL | 3 refills | Status: DC

## 2018-03-24 NOTE — Telephone Encounter (Signed)
Done

## 2018-03-24 NOTE — Telephone Encounter (Signed)
Patient would like her Prilosec sent to [facility] instead local pharmacy.  She has 3 refills currently, so would you go ahead and change that over to [facility] please?  Thank you.

## 2018-03-30 DIAGNOSIS — M059 Rheumatoid arthritis with rheumatoid factor, unspecified: Secondary | ICD-10-CM | POA: Diagnosis not present

## 2018-03-30 DIAGNOSIS — M1990 Unspecified osteoarthritis, unspecified site: Secondary | ICD-10-CM | POA: Diagnosis not present

## 2018-03-30 DIAGNOSIS — E790 Hyperuricemia without signs of inflammatory arthritis and tophaceous disease: Secondary | ICD-10-CM | POA: Diagnosis not present

## 2018-03-30 DIAGNOSIS — D473 Essential (hemorrhagic) thrombocythemia: Secondary | ICD-10-CM | POA: Diagnosis not present

## 2018-03-30 DIAGNOSIS — M810 Age-related osteoporosis without current pathological fracture: Secondary | ICD-10-CM | POA: Diagnosis not present

## 2018-04-05 DIAGNOSIS — M0579 Rheumatoid arthritis with rheumatoid factor of multiple sites without organ or systems involvement: Secondary | ICD-10-CM | POA: Diagnosis not present

## 2018-05-03 DIAGNOSIS — M0579 Rheumatoid arthritis with rheumatoid factor of multiple sites without organ or systems involvement: Secondary | ICD-10-CM | POA: Diagnosis not present

## 2018-05-25 ENCOUNTER — Telehealth: Payer: Self-pay | Admitting: Internal Medicine

## 2018-05-25 MED ORDER — TERCONAZOLE 0.8 % VA CREA: TOPICAL_CREAM | VAGINAL | 99 refills | Status: DC

## 2018-05-25 NOTE — Telephone Encounter (Signed)
Phone call from pt to refill Terazole cream. E scribed with prn one year refills

## 2018-06-11 ENCOUNTER — Other Ambulatory Visit: Payer: Medicare Other | Admitting: Internal Medicine

## 2018-06-11 DIAGNOSIS — R0989 Other specified symptoms and signs involving the circulatory and respiratory systems: Secondary | ICD-10-CM

## 2018-06-11 DIAGNOSIS — E7800 Pure hypercholesterolemia, unspecified: Secondary | ICD-10-CM | POA: Diagnosis not present

## 2018-06-11 DIAGNOSIS — N183 Chronic kidney disease, stage 3 unspecified: Secondary | ICD-10-CM

## 2018-06-11 DIAGNOSIS — E785 Hyperlipidemia, unspecified: Secondary | ICD-10-CM

## 2018-06-11 DIAGNOSIS — R7302 Impaired glucose tolerance (oral): Secondary | ICD-10-CM | POA: Diagnosis not present

## 2018-06-11 DIAGNOSIS — I10 Essential (primary) hypertension: Secondary | ICD-10-CM

## 2018-06-11 DIAGNOSIS — D638 Anemia in other chronic diseases classified elsewhere: Secondary | ICD-10-CM

## 2018-06-12 LAB — CBC WITH DIFFERENTIAL/PLATELET
Absolute Monocytes: 834 cells/uL (ref 200–950)
Basophils Absolute: 49 cells/uL (ref 0–200)
Basophils Relative: 0.6 %
Eosinophils Absolute: 162 cells/uL (ref 15–500)
Eosinophils Relative: 2 %
HCT: 33.8 % — ABNORMAL LOW (ref 35.0–45.0)
Hemoglobin: 11.5 g/dL — ABNORMAL LOW (ref 11.7–15.5)
Lymphs Abs: 2973 cells/uL (ref 850–3900)
MCH: 33 pg (ref 27.0–33.0)
MCHC: 34 g/dL (ref 32.0–36.0)
MCV: 97.1 fL (ref 80.0–100.0)
MPV: 8.9 fL (ref 7.5–12.5)
Monocytes Relative: 10.3 %
Neutro Abs: 4082 cells/uL (ref 1500–7800)
Neutrophils Relative %: 50.4 %
Platelets: 898 10*3/uL — ABNORMAL HIGH (ref 140–400)
RBC: 3.48 10*6/uL — ABNORMAL LOW (ref 3.80–5.10)
RDW: 13.1 % (ref 11.0–15.0)
Total Lymphocyte: 36.7 %
WBC: 8.1 10*3/uL (ref 3.8–10.8)

## 2018-06-12 LAB — LIPID PANEL
Cholesterol: 213 mg/dL — ABNORMAL HIGH (ref <200)
HDL: 58 mg/dL (ref >50)
LDL Cholesterol (Calc): 135 mg/dL (calc) — ABNORMAL HIGH
Non-HDL Cholesterol (Calc): 155 mg/dL (calc) — ABNORMAL HIGH (ref <130)
Total CHOL/HDL Ratio: 3.7 (calc) (ref <5.0)
Triglycerides: 96 mg/dL (ref <150)

## 2018-06-12 LAB — HEMOGLOBIN A1C
Hgb A1c MFr Bld: 6 % of total Hgb — ABNORMAL HIGH (ref <5.7)
Mean Plasma Glucose: 126 (calc)
eAG (mmol/L): 7 (calc)

## 2018-06-12 LAB — COMPLETE METABOLIC PANEL WITH GFR
AG Ratio: 1.3 (calc) (ref 1.0–2.5)
ALT: 15 U/L (ref 6–29)
AST: 30 U/L (ref 10–35)
Albumin: 4.1 g/dL (ref 3.6–5.1)
Alkaline phosphatase (APISO): 84 U/L (ref 33–130)
BUN/Creatinine Ratio: 26 (calc) — ABNORMAL HIGH (ref 6–22)
BUN: 26 mg/dL — ABNORMAL HIGH (ref 7–25)
CO2: 25 mmol/L (ref 20–32)
Calcium: 9.9 mg/dL (ref 8.6–10.4)
Chloride: 90 mmol/L — ABNORMAL LOW (ref 98–110)
Creat: 1 mg/dL — ABNORMAL HIGH (ref 0.60–0.93)
GFR, Est African American: 62 mL/min/{1.73_m2} (ref >=60)
GFR, Est Non African American: 54 mL/min/{1.73_m2} — ABNORMAL LOW (ref >=60)
Globulin: 3.1 g/dL (calc) (ref 1.9–3.7)
Glucose, Bld: 91 mg/dL (ref 65–99)
Potassium: 4.6 mmol/L (ref 3.5–5.3)
Sodium: 125 mmol/L — ABNORMAL LOW (ref 135–146)
Total Bilirubin: 0.5 mg/dL (ref 0.2–1.2)
Total Protein: 7.2 g/dL (ref 6.1–8.1)

## 2018-06-12 LAB — TSH: TSH: 2.42 mIU/L (ref 0.40–4.50)

## 2018-06-12 NOTE — Progress Notes (Signed)
Labs drawn by CMA

## 2018-06-17 ENCOUNTER — Encounter: Payer: Self-pay | Admitting: Internal Medicine

## 2018-06-17 ENCOUNTER — Ambulatory Visit (INDEPENDENT_AMBULATORY_CARE_PROVIDER_SITE_OTHER): Payer: Medicare Other | Admitting: Internal Medicine

## 2018-06-17 VITALS — BP 140/70 | HR 80 | Ht 59.5 in | Wt 152.0 lb

## 2018-06-17 DIAGNOSIS — N183 Chronic kidney disease, stage 3 unspecified: Secondary | ICD-10-CM

## 2018-06-17 DIAGNOSIS — Z1211 Encounter for screening for malignant neoplasm of colon: Secondary | ICD-10-CM | POA: Diagnosis not present

## 2018-06-17 DIAGNOSIS — Z0000 Encounter for general adult medical examination without abnormal findings: Secondary | ICD-10-CM

## 2018-06-17 DIAGNOSIS — F411 Generalized anxiety disorder: Secondary | ICD-10-CM

## 2018-06-17 DIAGNOSIS — Z862 Personal history of diseases of the blood and blood-forming organs and certain disorders involving the immune mechanism: Secondary | ICD-10-CM

## 2018-06-17 DIAGNOSIS — I10 Essential (primary) hypertension: Secondary | ICD-10-CM | POA: Diagnosis not present

## 2018-06-17 DIAGNOSIS — K2950 Unspecified chronic gastritis without bleeding: Secondary | ICD-10-CM

## 2018-06-17 DIAGNOSIS — Z8619 Personal history of other infectious and parasitic diseases: Secondary | ICD-10-CM

## 2018-06-17 DIAGNOSIS — M059 Rheumatoid arthritis with rheumatoid factor, unspecified: Secondary | ICD-10-CM

## 2018-06-17 DIAGNOSIS — E7800 Pure hypercholesterolemia, unspecified: Secondary | ICD-10-CM

## 2018-06-17 DIAGNOSIS — G4762 Sleep related leg cramps: Secondary | ICD-10-CM

## 2018-06-17 DIAGNOSIS — R0989 Other specified symptoms and signs involving the circulatory and respiratory systems: Secondary | ICD-10-CM

## 2018-06-17 DIAGNOSIS — R7302 Impaired glucose tolerance (oral): Secondary | ICD-10-CM

## 2018-06-17 LAB — POCT URINALYSIS DIPSTICK
Appearance: NEGATIVE
Bilirubin, UA: NEGATIVE
Blood, UA: NEGATIVE
Glucose, UA: NEGATIVE
Ketones, UA: NEGATIVE
Leukocytes, UA: NEGATIVE
Nitrite, UA: NEGATIVE
Odor: NEGATIVE
Protein, UA: NEGATIVE
Spec Grav, UA: 1.015 (ref 1.010–1.025)
Urobilinogen, UA: 0.2 E.U./dL
pH, UA: 6 (ref 5.0–8.0)

## 2018-06-17 MED ORDER — ESOMEPRAZOLE MAGNESIUM 40 MG PO CPDR: DELAYED_RELEASE_CAPSULE | ORAL | 1 refills | Status: DC

## 2018-06-17 NOTE — Patient Instructions (Signed)
Stop diuretic and follow-up in 2 weeks.  GI cocktail given today in office 30 cc p.o.  Change PPI to Nexium.  Consider repeat testing for H. pylori or referral to GI if epigastric pain continues.

## 2018-06-17 NOTE — Progress Notes (Signed)
Subjective:    Patient ID: [name], female    DOB: 1940/11/19, 78 y.o.   MRN: [id number]  HPI 78 year old 9 female in today for health maintenance exam and evaluation of medical issues.  She is asking for an opinion regarding colonoscopy screening.  I think it is reasonable to do Cologuard at her age.  This order was placed.  History of diabetes mellitus, hypertension, nocturnal leg cramps, epigastric pain treated with PPI, iron deficiency anemia treated with iron therapy, chronic kidney disease, hyperlipidemia, rheumatoid arthritis.  Complaining of epigastric pain with burning despite being on PPI.  Changed to Nexium today.  Has eye exam annually with Dr. Herbert Deaner.  Last eye exam August 2019    Longstanding history of iron deficiency seen by Dr. Jana Hakim.  Hemoglobin stable.  Elevated platelet count likely inflammatory reaction from rheumatoid arthritis.  Sodium remains low in the mid 120s and is monitored.  She is on Maxide 75.  May need to discontinue this.  It is difficult to perform venipuncture here on her due to lack of good veins.  Followed by her rheumatologist.  History of abdominal bloating/constipation treated with MiraLAX  Talked with her today about screening for H. pylori but she declines.  Last checked by biopsy in 2017 on endoscopy by Dr. Sherral Hammers  History of pure hypercholesterolemia  Hypertension stable on current regimen  History of glucose intolerance-watches diet  Has history of bilateral carotid bruits  Sodium was normal in the summer 2018 but by January 2019 it dropped to 130.  In July 2019 was 126 and is now 125.  Urine specific gravity is 1.015.  She had colonoscopy by Dr. Watt Climes in 2014 showing chronic diverticulosis.  No mention of polyps.  She had esophageal stricture and gastritis on endoscopy.  History of laparoscopic vaginal hysterectomy BSO A&P repair August 2016 by Dr. Matthew Saras.  Was found to be anemic persistently postoperatively at  that time and iron level was 21.  Had repeat endoscopy February 2017 by Dr. Watt Climes showing small hiatal hernia and chronic gastritis which was biopsied.  Multiple gastric polyps were biopsied.  Pathology was negative for Helicobacter pylori.  Chronic PPI change noted.  She underwent right S1-S2 laminectomy with decompressive lumbar laminectomy L4-L5 and L5-S1 as well as S1-she is 25 May 2010 by Dr. Sherley Bounds.  Postoperatively she had a pulmonary embolus.  After that she had a viral syndrome and had been hospitalized in February 2012.  Did have H. pylori gastritis treated July 2000.  Compression fracture T7 in 2000.  History of abnormal Pap smear in 1995 with colposcopy.  Bilateral tubal ligation 1979.  D&C for menorrhagia in 1999.  Marsupialization of Bartholin cyst 1987.  Social history: She is a widow.  She is retired.  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  She does not smoke or consume alcohol.  She currently is residing with her daughter, [name].  Family history: Mother died of an MI with history of hypertension and was age 78 at the time of her death.  One brother with history of adult onset diabetes.  5 sisters 1 of whom has diabetes.  One daughter died of an occult malignancy but patient says today it was pancreatic cancer.  One brother in good health.  She has 9 children.  One daughter with history of pulmonary sarcoidosis years ago that has resolved.  She has a history of left wrist de Quervain's tenosynovitis.  History  of degenerative arthritis left thumb CMC joint.  Followed by rheumatologist for rheumatoid arthritis and currently on Orencia          Review of Systems  Constitutional: Positive for fatigue.  Eyes:       Has regular  eye exams  Respiratory: Negative.   Cardiovascular: Negative.   Gastrointestinal:       Epigastric pain, abdominal bloating  Genitourinary: Negative.   Musculoskeletal: Positive for  arthralgias.  Neurological: Negative.   Psychiatric/Behavioral:       Anxiety   see above     Objective:   Physical Exam Constitutional:      General: She is not in acute distress.    Appearance: She is well-developed.  HENT:     Head: Normocephalic and atraumatic.     Mouth/Throat:     Mouth: Mucous membranes are moist.  Eyes:     General: No scleral icterus. Cardiovascular:     Rate and Rhythm: Normal rate and regular rhythm.     Heart sounds: Normal heart sounds. No murmur.     Comments: Soft carotid bruits bilaterally Pulmonary:     Effort: Pulmonary effort is normal.     Breath sounds: Normal breath sounds. No wheezing or rales.  Abdominal:     General: Bowel sounds are normal.     Palpations: Abdomen is soft. There is no hepatomegaly, splenomegaly or mass.     Tenderness: There is no abdominal tenderness.  Genitourinary:    Comments: Deferred status post hysterectomy BSO Skin:    General: Skin is warm and dry.  Neurological:     General: No focal deficit present.     Mental Status: She is alert.  Psychiatric:        Mood and Affect: Mood is anxious.        Behavior: Behavior normal.    Vital signs reviewed,       Assessment & Plan:  History of Rheumatoid arthritis treated with Orencia by rheumatologist  History of iron deficiency-previously evaluated by hematologist and treated with iron therapy and is stable.  Has had colonoscopy by Dr. Watt Climes in the past with no evidence of bleeding.  History of chronic gastritis.  Has tested negative for H. pylori in the past.  Offered to recheck her today but she declines.  Says the GI cocktail helped in the past and she would like to have another dose of GI cocktail so this was given today 30 cc p.o.  Have changed her PPI to Nexium.  If she is not getting better in 4 weeks or so we may need to do another H. pylori test  Hyponatremia-stop Maxide and follow-up in 2 weeks  History of chronic kidney disease-creatinine  normal  History of bilateral shoulder arthroplasties  Nocturnal leg cramps treated with gabapentin  Essential hypertension-stable  Impaired glucose tolerance-stable with hemoglobin A1c 6%   History of chronic kidney disease manifested by elevated serum creatinine but creatinine is now normal  Pure hypercholesterolemia-stable on statin-total cholesterol 213, triglycerides normal, LDL 135-watch diet and continue statin  History of bilateral carotid bruits unchanged  Anxiety state-anxious about her health and other matters  Plan: Discontinue diuretic and follow-up in 2 weeks  Subjective:   Patient presents for Medicare Annual/Subsequent preventive examination.  Review Past Medical/Family/Social: See above   Risk Factors  Current exercise habits: Mostly sedentary Dietary issues discussed: Low-fat low carbohydrate  Cardiac risk factors: Hyperlipidemia and family history in mother  Depression Screen  (Note: if answer to  either of the following is "Yes", a more complete depression screening is indicated)   Over the past two weeks, have you felt down, depressed or hopeless? No  Over the past two weeks, have you felt little interest or pleasure in doing things? No Have you lost interest or pleasure in daily life? No Do you often feel hopeless? No Do you cry easily over simple problems? No   Activities of Daily Living  In your present state of health, do you have any difficulty performing the following activities?:   Driving?  None Managing money?  Limited income issues Feeding yourself?  No Getting from bed to chair?  Yes due to arthritis Climbing a flight of stairs?  Yes due to arthritis Preparing food and eating?:  Yes due to arthritis Bathing or showering?  Yes due to arthritis Getting dressed: Yes due to arthritis Getting to the toilet?  Yes due to arthritis Using the toilet: Yes see above Moving around from place to place: Yes due to arthritis In the past year have  you fallen or had a near fall?:  Yes tripped in the yard over a stick-says left knee became sore after fall and continues to hurt-to discuss with rheumatologist Are you sexually active? No  Do you have more than one partner? No   Hearing Difficulties: No  Do you often ask people to speak up or repeat themselves? No  Do you experience ringing or noises in your ears? No  Do you have difficulty understanding soft or whispered voices? No  Do you feel that you have a problem with memory? No Do you often misplace items? No    Home Safety:  Do you have a smoke alarm at your residence?  No Do you have grab bars in the bathroom?  No Do you have throw rugs in your house?  No   Cognitive Testing  Alert? Yes Normal Appearance?Yes  Oriented to person? Yes Place? Yes  Time? Yes  Recall of three objects?  Not tested Can perform simple calculations?  Not tested Displays appropriate judgment?Yes  Can read the correct time from a watch face?Yes   List the Names of Other Physician/Practitioners you currently use:  See referral list for the physicians patient is currently seeing.  Rheumatologist  Dr. Magod-gastroenterologist   Review of Systems: See above   Objective:     General appearance: Appears stated age and mildly obese  Head: Normocephalic, without obvious abnormality, atraumatic  Eyes: conj clear, EOMi PEERLA  Ears: normal TM's and external ear canals both ears  Nose: Nares normal. Septum midline. Mucosa normal. No drainage or sinus tenderness.  Throat: lips, mucosa, and tongue normal; teeth and gums normal  Neck: no adenopathy, no carotid bruit, no JVD, supple, symmetrical, trachea midline and thyroid not enlarged, symmetric, no tenderness/mass/nodules  No CVA tenderness.  Lungs: clear to auscultation bilaterally  Breasts: normal appearance, no masses or tenderness Heart: regular rate and rhythm, S1, S2 normal, no murmur, click, rub or gallop  Abdomen: soft, non-tender; bowel  sounds normal; no masses, no organomegaly  Musculoskeletal: ROM normal in all joints, no crepitus, no deformity, Normal muscle strengthen. Back  is symmetric, no curvature. Skin: Skin color, texture, turgor normal. No rashes or lesions  Lymph nodes: Cervical, supraclavicular, and axillary nodes normal.  Neurologic: CN 2 -12 Normal, Normal symmetric reflexes. Normal coordination and gait  Psych: Alert & Oriented x 3, Mood appear stable.    Assessment:    Annual wellness medicare exam  Plan:    During the course of the visit the patient was educated and counseled about appropriate screening and preventive services including:   Recommend annual flu vaccine     Patient Instructions (the written plan) was given to the patient.  Medicare Attestation  I have personally reviewed:  The patient's medical and social history  Their use of alcohol, tobacco or illicit drugs  Their current medications and supplements  The patient's functional ability including ADLs,fall risks, home safety risks, cognitive, and hearing and visual impairment  Diet and physical activities  Evidence for depression or mood disorders  The patient's weight, height, BMI, and visual acuity have been recorded in the chart. I have made referrals, counseling, and provided education to the patient based on review of the above and I have provided the patient with a written personalized care plan for preventive services.

## 2018-06-24 DIAGNOSIS — Z1211 Encounter for screening for malignant neoplasm of colon: Secondary | ICD-10-CM | POA: Diagnosis not present

## 2018-06-24 LAB — COLOGUARD: Cologuard: NEGATIVE

## 2018-06-30 ENCOUNTER — Encounter: Payer: Self-pay | Admitting: Internal Medicine

## 2018-06-30 DIAGNOSIS — E790 Hyperuricemia without signs of inflammatory arthritis and tophaceous disease: Secondary | ICD-10-CM | POA: Diagnosis not present

## 2018-06-30 DIAGNOSIS — M109 Gout, unspecified: Secondary | ICD-10-CM | POA: Diagnosis not present

## 2018-06-30 DIAGNOSIS — M1990 Unspecified osteoarthritis, unspecified site: Secondary | ICD-10-CM | POA: Diagnosis not present

## 2018-06-30 DIAGNOSIS — M059 Rheumatoid arthritis with rheumatoid factor, unspecified: Secondary | ICD-10-CM | POA: Diagnosis not present

## 2018-06-30 DIAGNOSIS — M810 Age-related osteoporosis without current pathological fracture: Secondary | ICD-10-CM | POA: Diagnosis not present

## 2018-07-08 DIAGNOSIS — H1013 Acute atopic conjunctivitis, bilateral: Secondary | ICD-10-CM | POA: Diagnosis not present

## 2018-07-08 DIAGNOSIS — H402211 Chronic angle-closure glaucoma, right eye, mild stage: Secondary | ICD-10-CM | POA: Diagnosis not present

## 2018-07-08 DIAGNOSIS — H402222 Chronic angle-closure glaucoma, left eye, moderate stage: Secondary | ICD-10-CM | POA: Diagnosis not present

## 2018-07-09 ENCOUNTER — Encounter: Payer: Self-pay | Admitting: Internal Medicine

## 2018-07-09 ENCOUNTER — Ambulatory Visit (INDEPENDENT_AMBULATORY_CARE_PROVIDER_SITE_OTHER): Payer: Medicare Other | Admitting: Internal Medicine

## 2018-07-09 VITALS — BP 130/70 | HR 78 | Temp 98.5°F | Ht 59.5 in | Wt 152.0 lb

## 2018-07-09 DIAGNOSIS — E871 Hypo-osmolality and hyponatremia: Secondary | ICD-10-CM | POA: Diagnosis not present

## 2018-07-09 DIAGNOSIS — E875 Hyperkalemia: Secondary | ICD-10-CM | POA: Diagnosis not present

## 2018-07-09 DIAGNOSIS — N183 Chronic kidney disease, stage 3 unspecified: Secondary | ICD-10-CM

## 2018-07-09 DIAGNOSIS — I10 Essential (primary) hypertension: Secondary | ICD-10-CM | POA: Diagnosis not present

## 2018-07-09 NOTE — Progress Notes (Signed)
   Subjective:    Patient ID: [name], female    DOB: 29-Jun-1940, 77 y.o.   MRN: [id number]  HPI in January she was here for routine health maintenance exam Medicare annual wellness and evaluation of medical issues.  It was noted that her sodium was low at 125.  Creatinine was normal.  Was advised to stop diuretic.  Says she has quit having leg cramps which is encouraging.  Her potassium was normal in January.    Review of Systems     Objective:   Physical Exam See orthostatic vital signs.  Currently not orthostatic. Creatinine stable at 1.08.  Glucose 104.  Potassium 5.7 and likely hemolyzed and will need to be repeated.  Sodium 138.  Chloride 99.      Assessment & Plan:  Hyponatremia resolved with stopping diuretic  Elevated serum potassium likely hemolyzed  History of chronic kidney disease stage III with stable creatinine 1.08  Addendum on July 13, 2018-repeat serum potassium 5.5.  She is a difficult blood draw.  Follow-up in 2 weeks.  Suspect this is once again hemolyzed.  She has no reason to have hyperkalemia.  In January her potassium was 4.6 and her sodium at that time was 125  Plan: She will need to return for repeat serum potassium

## 2018-07-10 LAB — BASIC METABOLIC PANEL
BUN/Creatinine Ratio: 24 (calc) — ABNORMAL HIGH (ref 6–22)
BUN: 26 mg/dL — ABNORMAL HIGH (ref 7–25)
CO2: 30 mmol/L (ref 20–32)
Calcium: 9.7 mg/dL (ref 8.6–10.4)
Chloride: 99 mmol/L (ref 98–110)
Creat: 1.08 mg/dL — ABNORMAL HIGH (ref 0.60–0.93)
Glucose, Bld: 104 mg/dL — ABNORMAL HIGH (ref 65–99)
Potassium: 5.7 mmol/L — ABNORMAL HIGH (ref 3.5–5.3)
Sodium: 138 mmol/L (ref 135–146)

## 2018-07-13 ENCOUNTER — Other Ambulatory Visit: Payer: Self-pay

## 2018-07-13 ENCOUNTER — Other Ambulatory Visit: Payer: Medicare Other | Admitting: Internal Medicine

## 2018-07-13 DIAGNOSIS — E875 Hyperkalemia: Secondary | ICD-10-CM

## 2018-07-13 LAB — POTASSIUM: Potassium: 5.5 mmol/L — ABNORMAL HIGH (ref 3.5–5.3)

## 2018-07-14 ENCOUNTER — Telehealth: Payer: Self-pay

## 2018-07-14 DIAGNOSIS — M810 Age-related osteoporosis without current pathological fracture: Secondary | ICD-10-CM | POA: Diagnosis not present

## 2018-07-14 DIAGNOSIS — M059 Rheumatoid arthritis with rheumatoid factor, unspecified: Secondary | ICD-10-CM | POA: Diagnosis not present

## 2018-07-14 DIAGNOSIS — M1990 Unspecified osteoarthritis, unspecified site: Secondary | ICD-10-CM | POA: Diagnosis not present

## 2018-07-14 DIAGNOSIS — E790 Hyperuricemia without signs of inflammatory arthritis and tophaceous disease: Secondary | ICD-10-CM | POA: Diagnosis not present

## 2018-07-14 DIAGNOSIS — M109 Gout, unspecified: Secondary | ICD-10-CM | POA: Diagnosis not present

## 2018-07-14 NOTE — Telephone Encounter (Signed)
Recheck potassium next week and run it stat. Someonw will need to bring her in the morning not afternoon to get this done.

## 2018-07-14 NOTE — Telephone Encounter (Addendum)
Spoke with patient she said she stopped taking MAXZIDE last week and she is not taking any potassium OTC.  Her daughter called back said that her mom's diet contain over 1000mg  of potassium daily and she said she will make sure patient eliminates all of these products containing potassium.

## 2018-07-15 ENCOUNTER — Encounter: Payer: Self-pay | Admitting: Internal Medicine

## 2018-07-15 DIAGNOSIS — M0579 Rheumatoid arthritis with rheumatoid factor of multiple sites without organ or systems involvement: Secondary | ICD-10-CM | POA: Diagnosis not present

## 2018-07-20 ENCOUNTER — Ambulatory Visit: Payer: Medicare Other | Admitting: Internal Medicine

## 2018-07-24 NOTE — Patient Instructions (Addendum)
Follow-up March 6.  Needs b- met at that time. 

## 2018-07-30 ENCOUNTER — Ambulatory Visit (INDEPENDENT_AMBULATORY_CARE_PROVIDER_SITE_OTHER): Payer: Medicare Other | Admitting: Internal Medicine

## 2018-07-30 ENCOUNTER — Encounter: Payer: Self-pay | Admitting: Internal Medicine

## 2018-07-30 VITALS — BP 130/60 | HR 80 | Ht 59.5 in | Wt 156.0 lb

## 2018-07-30 DIAGNOSIS — E875 Hyperkalemia: Secondary | ICD-10-CM | POA: Diagnosis not present

## 2018-07-30 DIAGNOSIS — I10 Essential (primary) hypertension: Secondary | ICD-10-CM

## 2018-07-30 LAB — BASIC METABOLIC PANEL
BUN/Creatinine Ratio: 22 (calc) (ref 6–22)
BUN: 25 mg/dL (ref 7–25)
CO2: 26 mmol/L (ref 20–32)
Calcium: 9.8 mg/dL (ref 8.6–10.4)
Chloride: 104 mmol/L (ref 98–110)
Creat: 1.14 mg/dL — ABNORMAL HIGH (ref 0.60–0.93)
Glucose, Bld: 138 mg/dL — ABNORMAL HIGH (ref 65–99)
Potassium: 5.5 mmol/L — ABNORMAL HIGH (ref 3.5–5.3)
Sodium: 139 mmol/L (ref 135–146)

## 2018-07-30 NOTE — Progress Notes (Signed)
   Subjective:    Patient ID: [name], female    DOB: 08-30-1940, 78 y.o.   MRN: [id number]  HPI 78 year old for follow-up on elevated serum potassium. She has been on a potassium sparing diuretic.  Her creatinine is stable at 1.14.  However, potassium was 5.5 when tested 80-month ago.  She stopped taking Maxide 2 weeks ago.    Objective:   Physical Exam Vital signs reviewed.  She is in no acute distress.  Chest clear to auscultation.  Cardiac exam regular rate and rhythm.  Skin warm and dry.  Trace lower extremity edema       Assessment & Plan:  Elevated serum potassium.  Repeat level was 5.5.  She will stop Maxide.  I have prescribed HCTZ.  She will stop ACE inhibitor.  Follow-up March 24

## 2018-08-03 ENCOUNTER — Other Ambulatory Visit: Payer: Self-pay

## 2018-08-03 MED ORDER — HYDROCHLOROTHIAZIDE 25 MG PO TABS: 25.0000 mg | ORAL_TABLET | Freq: Every day | ORAL | 0 refills | Status: DC

## 2018-08-04 MED ORDER — HYDROCHLOROTHIAZIDE 25 MG PO TABS: 25.0000 mg | ORAL_TABLET | Freq: Every day | ORAL | 3 refills | Status: DC

## 2018-08-04 NOTE — Telephone Encounter (Signed)
Stop Maxzide and Prinivil as both spare potassium. Rx for HCTZ 25 mg daily. Follow up March 24

## 2018-08-12 DIAGNOSIS — M0579 Rheumatoid arthritis with rheumatoid factor of multiple sites without organ or systems involvement: Secondary | ICD-10-CM | POA: Diagnosis not present

## 2018-08-16 ENCOUNTER — Ambulatory Visit: Payer: Medicare Other | Admitting: Internal Medicine

## 2018-08-19 ENCOUNTER — Encounter: Payer: Self-pay | Admitting: Internal Medicine

## 2018-08-19 ENCOUNTER — Other Ambulatory Visit: Payer: Self-pay

## 2018-08-19 ENCOUNTER — Ambulatory Visit (INDEPENDENT_AMBULATORY_CARE_PROVIDER_SITE_OTHER): Payer: Medicare Other | Admitting: Internal Medicine

## 2018-08-19 VITALS — BP 100/70 | Ht 59.5 in

## 2018-08-19 DIAGNOSIS — E875 Hyperkalemia: Secondary | ICD-10-CM | POA: Diagnosis not present

## 2018-08-19 DIAGNOSIS — J301 Allergic rhinitis due to pollen: Secondary | ICD-10-CM

## 2018-08-19 DIAGNOSIS — I10 Essential (primary) hypertension: Secondary | ICD-10-CM

## 2018-08-19 DIAGNOSIS — R600 Localized edema: Secondary | ICD-10-CM | POA: Diagnosis not present

## 2018-08-19 LAB — BASIC METABOLIC PANEL
BUN/Creatinine Ratio: 25 (calc) — ABNORMAL HIGH (ref 6–22)
BUN: 27 mg/dL — ABNORMAL HIGH (ref 7–25)
CO2: 32 mmol/L (ref 20–32)
Calcium: 10 mg/dL (ref 8.6–10.4)
Chloride: 99 mmol/L (ref 98–110)
Creat: 1.08 mg/dL — ABNORMAL HIGH (ref 0.60–0.93)
Glucose, Bld: 76 mg/dL (ref 65–99)
Potassium: 4.9 mmol/L (ref 3.5–5.3)
Sodium: 139 mmol/L (ref 135–146)

## 2018-08-19 MED ORDER — METHYLPREDNISOLONE ACETATE 80 MG/ML IJ SUSP
80.0000 mg | Freq: Once | INTRAMUSCULAR | Status: AC
  Administered 2018-08-19: 80 mg via INTRAMUSCULAR

## 2018-08-19 NOTE — Progress Notes (Signed)
   Subjective:    Patient ID: [name], female    DOB: 1940/05/28, 78 y.o.   MRN: [id number]  HPI 78 year old Female in for recheck. Today has had sinus headache x one week.Has been on Maxizide but stopped that and was switched to HCTZ due to elevated potassium. Potassium is 5.5 on March 6 th.  Lower extremity edema improved with HCTZ.  GI symptoms improved with medication.    Review of Systems less edema since restarted diuretic.     Objective:   Physical Exam BP 100/70 neck is supple.  Chest clear.  No adenopathy.  TMs and pharynx are clear.  No pitting edema.  Cardiac exam regular rate and rhythm.       Assessment & Plan:  Headache-she believes this to be due to allergy.  Could be tension headache from stress.  History of allergic rhinitis  Elevated serum potassium-B met repeated today on HCTZ.  Maxide was stopped at last visit  Dependent edema-continue HCTZ  Hypertension-continue HCTZ  Rheumatoid arthritis  Mild chronic kidney disease  Plan: Basic metabolic panel pending.  Follow-up July 2020.  This will be 68-monthrecheck.  Did not make appointment today.

## 2018-08-19 NOTE — Patient Instructions (Signed)
Serum potassium drawn.  Depo-Medrol 80 mg IM for nasal congestion and headache.  Had physical exam in January.  Will be due for 54-month recheck in July.

## 2018-08-21 NOTE — Patient Instructions (Signed)
Stop Maxide.  Take HCTZ.  Return March 24.  Stop ACE inhibitor.

## 2018-09-01 ENCOUNTER — Other Ambulatory Visit: Payer: Self-pay | Admitting: Internal Medicine

## 2018-09-07 ENCOUNTER — Encounter: Payer: Self-pay | Admitting: Internal Medicine

## 2018-09-07 ENCOUNTER — Telehealth: Payer: Self-pay | Admitting: Internal Medicine

## 2018-09-07 ENCOUNTER — Ambulatory Visit (INDEPENDENT_AMBULATORY_CARE_PROVIDER_SITE_OTHER): Payer: Medicare Other | Admitting: Internal Medicine

## 2018-09-07 ENCOUNTER — Other Ambulatory Visit: Payer: Self-pay

## 2018-09-07 DIAGNOSIS — F411 Generalized anxiety disorder: Secondary | ICD-10-CM

## 2018-09-07 DIAGNOSIS — R1013 Epigastric pain: Secondary | ICD-10-CM

## 2018-09-07 NOTE — Patient Instructions (Signed)
30 cc p.o. GI cocktail given today.  Call if symptoms persist.  Continue Nexium.

## 2018-09-07 NOTE — Telephone Encounter (Signed)
Oswin Johal [phone]  Angelise called to say that on Thursday she had a little cough so she too some Mucinex and this made her stomach burn and it has been burning ever since.

## 2018-09-07 NOTE — Progress Notes (Signed)
   Subjective:    Patient ID: [name], female    DOB: 1940/11/28, 78 y.o.   MRN: [id number]  HPI 78 year old Female with history of anxiety.  Had a cough over the weekend and took 3 doses of Mucinex.  Says that she began to have epigastric burning after that.  History of chronic gastritis and is tested negative for H. pylori in the past.  She says GI cocktail has helped her in the past.  We gave her 30 cc p.o. today.  In January we change PPI to Nexium and she improved.    Review of Systems see above.  No nausea vomiting diarrhea fever or chills     Objective:   Physical Exam Patient was seen in alto and not examined.  Vital signs were not obtained.  Shee appears to be in no acute distress.  She was driven to the office by her daughter       Assessment & Plan:  Epigastric pain  Plan: 30 cc p.o. GI cocktail.  Continue Nexium.  Consider Carafate slurry if symptoms persist.

## 2018-09-07 NOTE — Telephone Encounter (Signed)
Scheduled

## 2018-09-07 NOTE — Telephone Encounter (Signed)
This should not cause her stomach to burn.

## 2018-09-09 DIAGNOSIS — M0579 Rheumatoid arthritis with rheumatoid factor of multiple sites without organ or systems involvement: Secondary | ICD-10-CM | POA: Diagnosis not present

## 2018-10-07 DIAGNOSIS — M0579 Rheumatoid arthritis with rheumatoid factor of multiple sites without organ or systems involvement: Secondary | ICD-10-CM | POA: Diagnosis not present

## 2018-10-12 DIAGNOSIS — M810 Age-related osteoporosis without current pathological fracture: Secondary | ICD-10-CM | POA: Diagnosis not present

## 2018-10-12 DIAGNOSIS — M1990 Unspecified osteoarthritis, unspecified site: Secondary | ICD-10-CM | POA: Diagnosis not present

## 2018-10-12 DIAGNOSIS — M059 Rheumatoid arthritis with rheumatoid factor, unspecified: Secondary | ICD-10-CM | POA: Diagnosis not present

## 2018-10-12 DIAGNOSIS — M109 Gout, unspecified: Secondary | ICD-10-CM | POA: Diagnosis not present

## 2018-10-12 DIAGNOSIS — E790 Hyperuricemia without signs of inflammatory arthritis and tophaceous disease: Secondary | ICD-10-CM | POA: Diagnosis not present

## 2018-10-14 ENCOUNTER — Telehealth: Payer: Self-pay | Admitting: Internal Medicine

## 2018-10-14 DIAGNOSIS — R739 Hyperglycemia, unspecified: Secondary | ICD-10-CM

## 2018-10-14 NOTE — Telephone Encounter (Signed)
[name] [phone]  [name] called to say that she had seen her rheumatologist as they were concerned about her blood sugar and advised her to call her PCP

## 2018-10-14 NOTE — Telephone Encounter (Signed)
After speaking with Dr Renold Genta I called [name] back to ask if she had been fasting when she had labs done and if she is testing her blood sugar at home. She was not fasting at time of labs and she is not taking her blood sugars at home due to machine being broken. So I let her know that Dr Kerby Less was not worried about blood sugars as much since she was not fasting at time of labs and for her to start taking blood sugars and write them down and call us next week with results. We called in to [facility] for her to get new machine and supplies.

## 2018-10-19 ENCOUNTER — Other Ambulatory Visit: Payer: Self-pay

## 2018-10-19 MED ORDER — MUPIROCIN 2 % EX OINT: TOPICAL_OINTMENT | CUTANEOUS | 11 refills | Status: DC

## 2018-10-25 ENCOUNTER — Telehealth: Payer: Self-pay | Admitting: Internal Medicine

## 2018-10-25 NOTE — Telephone Encounter (Signed)
Call in Terazol vaginal cream with 2 refills

## 2018-10-25 NOTE — Telephone Encounter (Signed)
Pt called and said she lost box for vaginal itch cream and wanted to know if we could send in a refill for it

## 2018-10-26 MED ORDER — TERCONAZOLE 0.8 % VA CREA: TOPICAL_CREAM | VAGINAL | 1 refills | Status: DC

## 2018-10-26 NOTE — Addendum Note (Signed)
Addended by: Mady Haagensen on: 10/26/2018 09:24 AM   Modules accepted: Orders

## 2018-10-26 NOTE — Telephone Encounter (Signed)
Patient called back and said that she wanted the one you rub on the outside

## 2018-10-26 NOTE — Telephone Encounter (Signed)
This can be rubbed on the outside if she has picked it up

## 2018-10-26 NOTE — Telephone Encounter (Signed)
Pt was notified of instructions, pt verbalized understanding.   

## 2018-11-04 ENCOUNTER — Other Ambulatory Visit: Payer: Self-pay

## 2018-11-04 ENCOUNTER — Telehealth: Payer: Self-pay | Admitting: Internal Medicine

## 2018-11-04 ENCOUNTER — Ambulatory Visit (INDEPENDENT_AMBULATORY_CARE_PROVIDER_SITE_OTHER): Payer: Medicare Other | Admitting: Internal Medicine

## 2018-11-04 ENCOUNTER — Encounter: Payer: Self-pay | Admitting: Internal Medicine

## 2018-11-04 VITALS — BP 110/60 | HR 73 | Ht 59.5 in | Wt 152.0 lb

## 2018-11-04 DIAGNOSIS — R8290 Unspecified abnormal findings in urine: Secondary | ICD-10-CM | POA: Diagnosis not present

## 2018-11-04 DIAGNOSIS — L292 Pruritus vulvae: Secondary | ICD-10-CM

## 2018-11-04 DIAGNOSIS — R350 Frequency of micturition: Secondary | ICD-10-CM

## 2018-11-04 DIAGNOSIS — M0579 Rheumatoid arthritis with rheumatoid factor of multiple sites without organ or systems involvement: Secondary | ICD-10-CM | POA: Diagnosis not present

## 2018-11-04 DIAGNOSIS — R300 Dysuria: Secondary | ICD-10-CM | POA: Diagnosis not present

## 2018-11-04 LAB — POCT URINALYSIS DIPSTICK
Bilirubin, UA: NEGATIVE
Blood, UA: NEGATIVE
Glucose, UA: NEGATIVE
Ketones, UA: NEGATIVE
Nitrite, UA: NEGATIVE
Protein, UA: NEGATIVE
Spec Grav, UA: 1.01 (ref 1.010–1.025)
Urobilinogen, UA: 0.2 E.U./dL
pH, UA: 6 (ref 5.0–8.0)

## 2018-11-04 MED ORDER — TRIAMCINOLONE ACETONIDE 0.1 % EX CREA: 1.0000 "application " | TOPICAL_CREAM | Freq: Two times a day (BID) | CUTANEOUS | 0 refills | Status: DC

## 2018-11-04 MED ORDER — CLOTRIMAZOLE-BETAMETHASONE 1-0.05 % EX CREA: 1.0000 "application " | TOPICAL_CREAM | Freq: Two times a day (BID) | CUTANEOUS | 2 refills | Status: DC

## 2018-11-04 NOTE — Telephone Encounter (Signed)
[name] Still has burning and itching

## 2018-11-04 NOTE — Progress Notes (Signed)
   Subjective:    Patient ID: [name], female    DOB: [birth year], 78 y.o.   MRN: [id number]  HPI 78 year old female complaining of perineal itching.  Also complaining of some urinary frequency and dysuria.    Review of Systems     Objective:   Physical Exam She is afebrile.  LE noted on urine dipstick.  No vaginal discharge.       Assessment & Plan:  Perineal itching-prescribed Lotrisone topically twice daily.  Was previously prescribed Terazol vaginal cream on June 2.  Abnormal urinalysis-culture sent   Addendum June 14: Has Klebsiella UTI.  Treat with Macrobid 100 mg twice daily for 7 days and follow-up June 19.  Spoke with patient by phone.

## 2018-11-06 ENCOUNTER — Telehealth: Payer: Self-pay | Admitting: Internal Medicine

## 2018-11-06 ENCOUNTER — Encounter: Payer: Self-pay | Admitting: Internal Medicine

## 2018-11-06 LAB — URINE CULTURE
MICRO NUMBER:: 560103
SPECIMEN QUALITY:: ADEQUATE

## 2018-11-06 MED ORDER — NITROFURANTOIN MONOHYD MACRO 100 MG PO CAPS: 100.0000 mg | ORAL_CAPSULE | Freq: Two times a day (BID) | ORAL | 0 refills | Status: DC

## 2018-11-06 NOTE — Telephone Encounter (Signed)
Spoke with patient personally regarding Klebsiella pneumoniae urinary tract infection.  Have called in Macrobid 100 mg twice daily for 7 days.  She will follow-up here in the office with nurse visit, vital signs, repeat urine dipstick on June 19.  Says she does not have urinary tract infection symptoms.  Is feeling better after office visit with Lotrisone being prescribed topically.

## 2018-11-11 ENCOUNTER — Ambulatory Visit (INDEPENDENT_AMBULATORY_CARE_PROVIDER_SITE_OTHER): Payer: Medicare Other | Admitting: Internal Medicine

## 2018-11-11 ENCOUNTER — Other Ambulatory Visit: Payer: Self-pay

## 2018-11-11 ENCOUNTER — Other Ambulatory Visit: Payer: Self-pay | Admitting: Internal Medicine

## 2018-11-11 ENCOUNTER — Encounter: Payer: Self-pay | Admitting: Internal Medicine

## 2018-11-11 VITALS — Temp 97.5°F

## 2018-11-11 DIAGNOSIS — N3000 Acute cystitis without hematuria: Secondary | ICD-10-CM | POA: Diagnosis not present

## 2018-11-11 DIAGNOSIS — R8290 Unspecified abnormal findings in urine: Secondary | ICD-10-CM

## 2018-11-11 LAB — POCT URINALYSIS DIP (CLINITEK)
Bilirubin, UA: NEGATIVE
Blood, UA: NEGATIVE
Glucose, UA: NEGATIVE mg/dL
Ketones, POC UA: NEGATIVE mg/dL
Nitrite, UA: NEGATIVE
POC PROTEIN,UA: NEGATIVE
Spec Grav, UA: 1.01 (ref 1.010–1.025)
Urobilinogen, UA: 0.2 E.U./dL
pH, UA: 6.5 (ref 5.0–8.0)

## 2018-11-11 NOTE — Progress Notes (Signed)
Patient here for nurse visit.

## 2018-11-11 NOTE — Progress Notes (Signed)
Here for nurse visit to follow up on recent UTI. Was compliant with antibiotic.  Repeat urine specimen obtained and dipstick shows LE but pt feeling better.  Will reculture urine.  Urine culture a week ago grew Klebsiella pneumoniae 10-50,000 colonies per milliliter.  Was started on Macrobid 100 mg twice daily for 7 days.  She still has 2 days left of this medication.

## 2018-11-11 NOTE — Patient Instructions (Signed)
Urine dipstick is still abnormal showing LE.  Finish course of Macrobid previously prescribed for 7 days.  Urine has been recultured.

## 2018-11-12 ENCOUNTER — Ambulatory Visit: Payer: Medicare Other | Admitting: Internal Medicine

## 2018-11-15 ENCOUNTER — Telehealth: Payer: Self-pay | Admitting: Internal Medicine

## 2018-11-15 LAB — URINE CULTURE
MICRO NUMBER:: 584024
SPECIMEN QUALITY:: ADEQUATE

## 2018-11-15 LAB — HOUSE ACCOUNT TRACKING

## 2018-11-15 MED ORDER — CIPROFLOXACIN HCL 250 MG PO TABS: 250.0000 mg | ORAL_TABLET | Freq: Two times a day (BID) | ORAL | 0 refills | Status: DC

## 2018-11-15 NOTE — Telephone Encounter (Signed)
Still has Klebsiella UTI. Switch to low dose Cipro 250 mg bid x 7 days and follow up in about 10 days

## 2018-11-20 NOTE — Patient Instructions (Signed)
Lotrisone cream to perineal area twice daily.  Addendum: Treat with Macrobid 100 mg twice daily for 7 days with follow-up on June 19

## 2018-11-24 ENCOUNTER — Ambulatory Visit (INDEPENDENT_AMBULATORY_CARE_PROVIDER_SITE_OTHER): Payer: Medicare Other | Admitting: Internal Medicine

## 2018-11-24 ENCOUNTER — Other Ambulatory Visit: Payer: Self-pay

## 2018-11-24 ENCOUNTER — Encounter: Payer: Self-pay | Admitting: Internal Medicine

## 2018-11-24 VITALS — BP 110/60 | HR 62 | Ht 59.5 in | Wt 152.0 lb

## 2018-11-24 DIAGNOSIS — R8290 Unspecified abnormal findings in urine: Secondary | ICD-10-CM

## 2018-11-24 NOTE — Progress Notes (Signed)
   Subjective:    Patient ID: [name], female    DOB: 1940/12/28, 78 y.o.   MRN: [id number]  HPI Had Klebsiella pneumoniae urinary tract infection on June 11.  Was treated with Macrobid with follow-up recommended in 7 days.  She had some vulvar itching and was not complaining of urinary tract infection symptoms.  She reported feeling better with Lotrisone which was prescribed topically.  She came in for nurse visit on June 18.  Urine dipstick still showed LE but patient noted feeling better.  We recultured her urine.  She still had 2 days left of Macrobid.  This urine culture grew Klebsiella pneumoniae once again.  She was treated with low-dose Cipro 250 mg twice daily for 10 days. Now here for follow-up.    Review of Systems see above     Objective:   Physical Exam  Seen by CMA.  Dipstick was apparently not recorded.      Assessment & Plan:  Status post treatment for Klebsiella UTI with Cipro which was sensitive to the organism.  There was an error in that dipstick was not recorded.  She is asymptomatic.  We will not pursue any further at this point in time.  Patient will not be charged for this visit

## 2018-11-29 ENCOUNTER — Encounter: Payer: Self-pay | Admitting: Internal Medicine

## 2018-11-29 DIAGNOSIS — Z1231 Encounter for screening mammogram for malignant neoplasm of breast: Secondary | ICD-10-CM | POA: Diagnosis not present

## 2018-11-29 DIAGNOSIS — Z803 Family history of malignant neoplasm of breast: Secondary | ICD-10-CM | POA: Diagnosis not present

## 2018-12-02 ENCOUNTER — Encounter: Payer: Self-pay | Admitting: Internal Medicine

## 2018-12-02 DIAGNOSIS — M0579 Rheumatoid arthritis with rheumatoid factor of multiple sites without organ or systems involvement: Secondary | ICD-10-CM | POA: Diagnosis not present

## 2018-12-10 ENCOUNTER — Other Ambulatory Visit: Payer: Medicare Other | Admitting: Internal Medicine

## 2018-12-10 ENCOUNTER — Other Ambulatory Visit: Payer: Self-pay

## 2018-12-10 DIAGNOSIS — R7302 Impaired glucose tolerance (oral): Secondary | ICD-10-CM | POA: Diagnosis not present

## 2018-12-10 DIAGNOSIS — E7800 Pure hypercholesterolemia, unspecified: Secondary | ICD-10-CM | POA: Diagnosis not present

## 2018-12-10 DIAGNOSIS — I10 Essential (primary) hypertension: Secondary | ICD-10-CM

## 2018-12-11 LAB — HEPATIC FUNCTION PANEL
AG Ratio: 1.4 (calc) (ref 1.0–2.5)
ALT: 14 U/L (ref 6–29)
AST: 26 U/L (ref 10–35)
Albumin: 4 g/dL (ref 3.6–5.1)
Alkaline phosphatase (APISO): 74 U/L (ref 37–153)
Bilirubin, Direct: 0.1 mg/dL (ref 0.0–0.2)
Globulin: 2.8 g/dL (calc) (ref 1.9–3.7)
Indirect Bilirubin: 0.3 mg/dL (calc) (ref 0.2–1.2)
Total Bilirubin: 0.4 mg/dL (ref 0.2–1.2)
Total Protein: 6.8 g/dL (ref 6.1–8.1)

## 2018-12-11 LAB — COMPLETE METABOLIC PANEL WITH GFR
AG Ratio: 1.4 (calc) (ref 1.0–2.5)
ALT: 14 U/L (ref 6–29)
AST: 26 U/L (ref 10–35)
Albumin: 4 g/dL (ref 3.6–5.1)
Alkaline phosphatase (APISO): 74 U/L (ref 37–153)
BUN/Creatinine Ratio: 28 (calc) — ABNORMAL HIGH (ref 6–22)
BUN: 36 mg/dL — ABNORMAL HIGH (ref 7–25)
CO2: 27 mmol/L (ref 20–32)
Calcium: 9.6 mg/dL (ref 8.6–10.4)
Chloride: 98 mmol/L (ref 98–110)
Creat: 1.29 mg/dL — ABNORMAL HIGH (ref 0.60–0.88)
GFR, Est African American: 45 mL/min/{1.73_m2} — ABNORMAL LOW (ref >=60)
GFR, Est Non African American: 39 mL/min/{1.73_m2} — ABNORMAL LOW (ref >=60)
Globulin: 2.8 g/dL (calc) (ref 1.9–3.7)
Glucose, Bld: 72 mg/dL (ref 65–99)
Potassium: 5.5 mmol/L — ABNORMAL HIGH (ref 3.5–5.3)
Sodium: 134 mmol/L — ABNORMAL LOW (ref 135–146)
Total Bilirubin: 0.4 mg/dL (ref 0.2–1.2)
Total Protein: 6.8 g/dL (ref 6.1–8.1)

## 2018-12-11 LAB — LIPID PANEL
Cholesterol: 209 mg/dL — ABNORMAL HIGH (ref <200)
HDL: 57 mg/dL (ref >=50)
LDL Cholesterol (Calc): 140 mg/dL (calc) — ABNORMAL HIGH
Non-HDL Cholesterol (Calc): 152 mg/dL (calc) — ABNORMAL HIGH (ref <130)
Total CHOL/HDL Ratio: 3.7 (calc) (ref <5.0)
Triglycerides: 40 mg/dL (ref <150)

## 2018-12-11 LAB — HEMOGLOBIN A1C
Hgb A1c MFr Bld: 6.3 % of total Hgb — ABNORMAL HIGH (ref <5.7)
Mean Plasma Glucose: 134 (calc)
eAG (mmol/L): 7.4 (calc)

## 2018-12-15 ENCOUNTER — Encounter: Payer: Self-pay | Admitting: Internal Medicine

## 2018-12-15 NOTE — Patient Instructions (Signed)
Patient now asymptomatic status post treatment with Cipro for Klebsiella pneumoniae UTI.

## 2018-12-17 ENCOUNTER — Other Ambulatory Visit: Payer: Self-pay

## 2018-12-17 ENCOUNTER — Ambulatory Visit (INDEPENDENT_AMBULATORY_CARE_PROVIDER_SITE_OTHER): Payer: Medicare Other | Admitting: Internal Medicine

## 2018-12-17 ENCOUNTER — Ambulatory Visit: Payer: Self-pay | Admitting: Internal Medicine

## 2018-12-17 ENCOUNTER — Encounter: Payer: Self-pay | Admitting: Internal Medicine

## 2018-12-17 VITALS — BP 120/80 | HR 74 | Temp 98.6°F | Ht 59.5 in | Wt 152.0 lb

## 2018-12-17 DIAGNOSIS — Z8659 Personal history of other mental and behavioral disorders: Secondary | ICD-10-CM

## 2018-12-17 DIAGNOSIS — G4762 Sleep related leg cramps: Secondary | ICD-10-CM

## 2018-12-17 DIAGNOSIS — R7302 Impaired glucose tolerance (oral): Secondary | ICD-10-CM

## 2018-12-17 DIAGNOSIS — E7800 Pure hypercholesterolemia, unspecified: Secondary | ICD-10-CM

## 2018-12-17 DIAGNOSIS — N183 Chronic kidney disease, stage 3 unspecified: Secondary | ICD-10-CM

## 2018-12-17 DIAGNOSIS — M059 Rheumatoid arthritis with rheumatoid factor, unspecified: Secondary | ICD-10-CM

## 2018-12-17 DIAGNOSIS — I10 Essential (primary) hypertension: Secondary | ICD-10-CM

## 2018-12-17 DIAGNOSIS — Z862 Personal history of diseases of the blood and blood-forming organs and certain disorders involving the immune mechanism: Secondary | ICD-10-CM

## 2018-12-17 MED ORDER — ATORVASTATIN CALCIUM 20 MG PO TABS: 20.0000 mg | ORAL_TABLET | Freq: Every day | ORAL | 3 refills | Status: DC

## 2018-12-17 NOTE — Progress Notes (Signed)
   Subjective:    Patient ID: [name], female    DOB: 1941/04/18, 78 y.o.   MRN: [id number]  HPI  78 year old Female in for 6 month recheck.  History of diabetes mellitus, hypertension, chronic kidney disease, hyperlipidemia, rheumatoid arthritis, iron deficiency anemia.  Hemoglobin A1c 6.3% and previously was 6% in January.  Creatinine has increased from 1.08 in March to 1.29.  Liver functions are stable.  Lipid panel shows total cholesterol 209 and LDL cholesterol 140.  HDL is 57 and triglycerides are 40.  Previously in January total cholesterol 213, HDL cholesterol 58, triglycerides 96 and LDL cholesterol 135.  Patient thinks perhaps lipid medication is causing some myalgias.  I think is more likely to be rheumatoid arthritis but we will change to Lipitor 20 mg daily.    With regard to hypertension blood pressure stable at 120/80 on current regimen.  She takes Orencia for rheumatoid arthritis  Review of Systems history of anxiety treated with Xanax     Objective:   Physical Exam  Neck supple without JVD or thyromegaly..  Chest clear to auscultation.  Cardiac exam regular rate and rhythm.  Normal S1 and S2 without murmurs or gallops.  Extremities without edema.  Vital signs stable and reviewed.      Assessment & Plan:  Hyperlipidemia-complaint of myalgias change to Lipitor.  Discontinue Zocor.  Follow-up in 3 months.  Impaired glucose tolerance-treated with diet  GE reflux treated with Nexium  Rheumatoid arthritis followed by rheumatologist and treated with Orencia  Anxiety treated with Xanax  Hypertension stable on current regimen with no changes  History of hyperuricemia followed by rheumatologist.  Unable to tolerate Uloric or allopurinol  Nocturnal leg cramps treated with gabapentin  Plan: Return appointment mid-October with fasting lab work including lipid panel liver functions and hemoglobin A1c.  Medicare wellness visit and physical examination scheduled  for January.  Chronic kidney disease-need to continue to monitor.  Creatinine is up a bit with recent lab work.  25 minutes spent with patient including reviewing medical records, history taking, physical examination and medical decision making

## 2018-12-30 DIAGNOSIS — M0579 Rheumatoid arthritis with rheumatoid factor of multiple sites without organ or systems involvement: Secondary | ICD-10-CM | POA: Diagnosis not present

## 2019-01-10 ENCOUNTER — Encounter: Payer: Self-pay | Admitting: Internal Medicine

## 2019-01-10 NOTE — Patient Instructions (Addendum)
Change Zocor to Lipitor and follow-up in October.  Continue to watch diet.  Blood pressure stable on current regimen.  Continue Xanax for anxiety

## 2019-01-12 DIAGNOSIS — M109 Gout, unspecified: Secondary | ICD-10-CM | POA: Diagnosis not present

## 2019-01-12 DIAGNOSIS — E790 Hyperuricemia without signs of inflammatory arthritis and tophaceous disease: Secondary | ICD-10-CM | POA: Diagnosis not present

## 2019-01-12 DIAGNOSIS — M059 Rheumatoid arthritis with rheumatoid factor, unspecified: Secondary | ICD-10-CM | POA: Diagnosis not present

## 2019-01-12 DIAGNOSIS — M1990 Unspecified osteoarthritis, unspecified site: Secondary | ICD-10-CM | POA: Diagnosis not present

## 2019-01-27 ENCOUNTER — Other Ambulatory Visit: Payer: Self-pay

## 2019-01-27 ENCOUNTER — Ambulatory Visit (INDEPENDENT_AMBULATORY_CARE_PROVIDER_SITE_OTHER): Payer: Medicare Other | Admitting: Internal Medicine

## 2019-01-27 ENCOUNTER — Encounter: Payer: Self-pay | Admitting: Internal Medicine

## 2019-01-27 DIAGNOSIS — Z23 Encounter for immunization: Secondary | ICD-10-CM

## 2019-01-27 NOTE — Patient Instructions (Signed)
Patient received a flu vaccine IM L deltoid, AV, CMA  

## 2019-01-27 NOTE — Progress Notes (Signed)
Flu vaccine given by CMA 

## 2019-01-31 ENCOUNTER — Other Ambulatory Visit: Payer: Self-pay | Admitting: Internal Medicine

## 2019-02-01 DIAGNOSIS — M0579 Rheumatoid arthritis with rheumatoid factor of multiple sites without organ or systems involvement: Secondary | ICD-10-CM | POA: Diagnosis not present

## 2019-03-01 DIAGNOSIS — M0579 Rheumatoid arthritis with rheumatoid factor of multiple sites without organ or systems involvement: Secondary | ICD-10-CM | POA: Diagnosis not present

## 2019-03-03 ENCOUNTER — Other Ambulatory Visit: Payer: Self-pay

## 2019-03-03 ENCOUNTER — Other Ambulatory Visit: Payer: Medicare Other | Admitting: Internal Medicine

## 2019-03-03 DIAGNOSIS — E7800 Pure hypercholesterolemia, unspecified: Secondary | ICD-10-CM | POA: Diagnosis not present

## 2019-03-03 DIAGNOSIS — R7302 Impaired glucose tolerance (oral): Secondary | ICD-10-CM

## 2019-03-04 LAB — HEPATIC FUNCTION PANEL
AG Ratio: 1.3 (calc) (ref 1.0–2.5)
ALT: 15 U/L (ref 6–29)
AST: 30 U/L (ref 10–35)
Albumin: 4 g/dL (ref 3.6–5.1)
Alkaline phosphatase (APISO): 76 U/L (ref 37–153)
Bilirubin, Direct: 0.1 mg/dL (ref 0.0–0.2)
Globulin: 3 g/dL (calc) (ref 1.9–3.7)
Indirect Bilirubin: 0.3 mg/dL (calc) (ref 0.2–1.2)
Total Bilirubin: 0.4 mg/dL (ref 0.2–1.2)
Total Protein: 7 g/dL (ref 6.1–8.1)

## 2019-03-04 LAB — LIPID PANEL
Cholesterol: 195 mg/dL (ref <200)
HDL: 59 mg/dL (ref >=50)
LDL Cholesterol (Calc): 122 mg/dL (calc) — ABNORMAL HIGH
Non-HDL Cholesterol (Calc): 136 mg/dL (calc) — ABNORMAL HIGH (ref <130)
Total CHOL/HDL Ratio: 3.3 (calc) (ref <5.0)
Triglycerides: 53 mg/dL (ref <150)

## 2019-03-04 LAB — HEMOGLOBIN A1C
Hgb A1c MFr Bld: 6 % of total Hgb — ABNORMAL HIGH (ref <5.7)
Mean Plasma Glucose: 126 (calc)
eAG (mmol/L): 7 (calc)

## 2019-03-04 LAB — SPECIMEN COMPROMISED

## 2019-03-10 ENCOUNTER — Ambulatory Visit (INDEPENDENT_AMBULATORY_CARE_PROVIDER_SITE_OTHER): Payer: Medicare Other | Admitting: Internal Medicine

## 2019-03-10 ENCOUNTER — Encounter: Payer: Self-pay | Admitting: Internal Medicine

## 2019-03-10 ENCOUNTER — Other Ambulatory Visit: Payer: Self-pay

## 2019-03-10 VITALS — BP 130/80 | HR 70 | Temp 98.6°F | Ht 59.5 in | Wt 154.0 lb

## 2019-03-10 DIAGNOSIS — R7302 Impaired glucose tolerance (oral): Secondary | ICD-10-CM

## 2019-03-10 DIAGNOSIS — F411 Generalized anxiety disorder: Secondary | ICD-10-CM

## 2019-03-10 DIAGNOSIS — I10 Essential (primary) hypertension: Secondary | ICD-10-CM

## 2019-03-10 DIAGNOSIS — Z862 Personal history of diseases of the blood and blood-forming organs and certain disorders involving the immune mechanism: Secondary | ICD-10-CM | POA: Diagnosis not present

## 2019-03-10 DIAGNOSIS — N1831 Chronic kidney disease, stage 3a: Secondary | ICD-10-CM | POA: Diagnosis not present

## 2019-03-10 DIAGNOSIS — E7800 Pure hypercholesterolemia, unspecified: Secondary | ICD-10-CM

## 2019-03-10 DIAGNOSIS — M059 Rheumatoid arthritis with rheumatoid factor, unspecified: Secondary | ICD-10-CM

## 2019-03-10 MED ORDER — TRIAMCINOLONE ACETONIDE 0.1 % EX CREA: 1.0000 "application " | TOPICAL_CREAM | Freq: Two times a day (BID) | CUTANEOUS | 3 refills | Status: DC

## 2019-03-26 NOTE — Progress Notes (Signed)
   Subjective:    Patient ID: [name], female    DOB: [birth year], 78 y.o.   MRN: [id number]  HPI  She was here in July for 36-month recheck.  Has diabetes mellitus, hypertension, chronic kidney disease, hyperlipidemia, rheumatoid arthritis and iron deficiency anemia.  She was changed from Zocor to Lipitor because she was complaining of myalgias. I was thinking that it is difficult to tell because she has arthralgias with  rheumatoid arthritis.  She is here for follow-up on lipids since starting new statin.  LDL is 122 previously was 143 months ago.  Hemoglobin A1c is 6% and previously was 6.3% in July.  Liver functions are normal.  She feels well and looks well at the present time.  Had flu vaccine in September   Review of Systems see above-longstanding history of rheumatoid arthritis.  History of iron deficiency anemia diagnosed by Dr. Jana Hakim     Objective:   Physical Exam Her blood pressure is 130/80 pulse 70 weight 154 pounds neck is supple without JVD thyromegaly or carotid bruits.  Chest clear to auscultation.  Cardiac exam regular rate and rhythm normal S1 and S2.  Extremities without edema.       Assessment & Plan:  Rheumatoid arthritis seen by rheumatologist. Treated with Orencia  History of iron deficient anemia-continue iron therapy  Hyperlipidemia-stable and improved on Lipitor.  We will continue with Lipitor.  Watch diet.  Try to walk some.  No complaint of myalgias on Lipitor.  Zocor discontinued at last visit  Chronic kidney disease-stable  Essential hypertension-stable on current regimen of HCTZ and lisinopril  Health maintenance has had flu vaccine  Impaired glucose tolerance-hemoglobin A1c 6%-continue to watch diet  Anxiety state treated with Xanax  Plan: Schedule health maintenance exam January 2021.

## 2019-03-26 NOTE — Patient Instructions (Addendum)
It was a pleasure to see you today.  Continue Lipitor and other medications as previously prescribed.  Physical exam due January 2021.

## 2019-04-04 DIAGNOSIS — M0579 Rheumatoid arthritis with rheumatoid factor of multiple sites without organ or systems involvement: Secondary | ICD-10-CM | POA: Diagnosis not present

## 2019-04-14 DIAGNOSIS — E790 Hyperuricemia without signs of inflammatory arthritis and tophaceous disease: Secondary | ICD-10-CM | POA: Diagnosis not present

## 2019-04-14 DIAGNOSIS — Z79899 Other long term (current) drug therapy: Secondary | ICD-10-CM | POA: Diagnosis not present

## 2019-04-14 DIAGNOSIS — M059 Rheumatoid arthritis with rheumatoid factor, unspecified: Secondary | ICD-10-CM | POA: Diagnosis not present

## 2019-04-14 DIAGNOSIS — G5600 Carpal tunnel syndrome, unspecified upper limb: Secondary | ICD-10-CM | POA: Diagnosis not present

## 2019-04-14 DIAGNOSIS — M1990 Unspecified osteoarthritis, unspecified site: Secondary | ICD-10-CM | POA: Diagnosis not present

## 2019-05-03 DIAGNOSIS — M0579 Rheumatoid arthritis with rheumatoid factor of multiple sites without organ or systems involvement: Secondary | ICD-10-CM | POA: Diagnosis not present

## 2019-05-24 DIAGNOSIS — Z961 Presence of intraocular lens: Secondary | ICD-10-CM | POA: Diagnosis not present

## 2019-05-24 DIAGNOSIS — H402222 Chronic angle-closure glaucoma, left eye, moderate stage: Secondary | ICD-10-CM | POA: Diagnosis not present

## 2019-05-24 DIAGNOSIS — H402211 Chronic angle-closure glaucoma, right eye, mild stage: Secondary | ICD-10-CM | POA: Diagnosis not present

## 2019-05-24 DIAGNOSIS — H1045 Other chronic allergic conjunctivitis: Secondary | ICD-10-CM | POA: Diagnosis not present

## 2019-05-24 LAB — HM DIABETES EYE EXAM

## 2019-05-31 ENCOUNTER — Encounter: Payer: Self-pay | Admitting: Internal Medicine

## 2019-06-08 ENCOUNTER — Other Ambulatory Visit: Payer: Self-pay | Admitting: Internal Medicine

## 2019-06-16 ENCOUNTER — Other Ambulatory Visit: Payer: Medicare Other | Admitting: Internal Medicine

## 2019-06-16 ENCOUNTER — Other Ambulatory Visit: Payer: Self-pay

## 2019-06-16 DIAGNOSIS — G4762 Sleep related leg cramps: Secondary | ICD-10-CM | POA: Diagnosis not present

## 2019-06-16 DIAGNOSIS — E7800 Pure hypercholesterolemia, unspecified: Secondary | ICD-10-CM | POA: Diagnosis not present

## 2019-06-16 DIAGNOSIS — M059 Rheumatoid arthritis with rheumatoid factor, unspecified: Secondary | ICD-10-CM

## 2019-06-16 DIAGNOSIS — R7302 Impaired glucose tolerance (oral): Secondary | ICD-10-CM | POA: Diagnosis not present

## 2019-06-16 DIAGNOSIS — I10 Essential (primary) hypertension: Secondary | ICD-10-CM

## 2019-06-16 DIAGNOSIS — D649 Anemia, unspecified: Secondary | ICD-10-CM | POA: Diagnosis not present

## 2019-06-16 DIAGNOSIS — N1831 Chronic kidney disease, stage 3a: Secondary | ICD-10-CM

## 2019-06-16 DIAGNOSIS — Z0000 Encounter for general adult medical examination without abnormal findings: Secondary | ICD-10-CM

## 2019-06-16 DIAGNOSIS — F411 Generalized anxiety disorder: Secondary | ICD-10-CM

## 2019-06-16 DIAGNOSIS — R0989 Other specified symptoms and signs involving the circulatory and respiratory systems: Secondary | ICD-10-CM | POA: Diagnosis not present

## 2019-06-17 LAB — COMPLETE METABOLIC PANEL WITH GFR
AG Ratio: 1.6 (calc) (ref 1.0–2.5)
ALT: 14 U/L (ref 6–29)
AST: 25 U/L (ref 10–35)
Albumin: 4 g/dL (ref 3.6–5.1)
Alkaline phosphatase (APISO): 71 U/L (ref 37–153)
BUN/Creatinine Ratio: 22 (calc) (ref 6–22)
BUN: 26 mg/dL — ABNORMAL HIGH (ref 7–25)
CO2: 26 mmol/L (ref 20–32)
Calcium: 9.9 mg/dL (ref 8.6–10.4)
Chloride: 100 mmol/L (ref 98–110)
Creat: 1.2 mg/dL — ABNORMAL HIGH (ref 0.60–0.88)
GFR, Est African American: 49 mL/min/{1.73_m2} — ABNORMAL LOW (ref >=60)
GFR, Est Non African American: 43 mL/min/{1.73_m2} — ABNORMAL LOW (ref >=60)
Globulin: 2.5 g/dL (calc) (ref 1.9–3.7)
Glucose, Bld: 82 mg/dL (ref 65–99)
Potassium: 5.2 mmol/L (ref 3.5–5.3)
Sodium: 136 mmol/L (ref 135–146)
Total Bilirubin: 0.4 mg/dL (ref 0.2–1.2)
Total Protein: 6.5 g/dL (ref 6.1–8.1)

## 2019-06-17 LAB — CBC WITH DIFFERENTIAL/PLATELET
Absolute Monocytes: 896 cells/uL (ref 200–950)
Basophils Absolute: 49 cells/uL (ref 0–200)
Basophils Relative: 0.7 %
Eosinophils Absolute: 385 cells/uL (ref 15–500)
Eosinophils Relative: 5.5 %
HCT: 31 % — ABNORMAL LOW (ref 35.0–45.0)
Hemoglobin: 10.4 g/dL — ABNORMAL LOW (ref 11.7–15.5)
Lymphs Abs: 2226 cells/uL (ref 850–3900)
MCH: 32.7 pg (ref 27.0–33.0)
MCHC: 33.5 g/dL (ref 32.0–36.0)
MCV: 97.5 fL (ref 80.0–100.0)
MPV: 8.9 fL (ref 7.5–12.5)
Monocytes Relative: 12.8 %
Neutro Abs: 3444 cells/uL (ref 1500–7800)
Neutrophils Relative %: 49.2 %
Platelets: 829 10*3/uL — ABNORMAL HIGH (ref 140–400)
RBC: 3.18 10*6/uL — ABNORMAL LOW (ref 3.80–5.10)
RDW: 14.2 % (ref 11.0–15.0)
Total Lymphocyte: 31.8 %
WBC: 7 10*3/uL (ref 3.8–10.8)

## 2019-06-17 LAB — LIPID PANEL
Cholesterol: 175 mg/dL (ref <200)
HDL: 46 mg/dL — ABNORMAL LOW (ref >=50)
LDL Cholesterol (Calc): 112 mg/dL (calc) — ABNORMAL HIGH
Non-HDL Cholesterol (Calc): 129 mg/dL (calc) (ref <130)
Total CHOL/HDL Ratio: 3.8 (calc) (ref <5.0)
Triglycerides: 76 mg/dL (ref <150)

## 2019-06-17 LAB — IRON, TOTAL/TOTAL IRON BINDING CAP
%SAT: 33 % (calc) (ref 16–45)
Iron: 103 ug/dL (ref 45–160)
TIBC: 316 mcg/dL (calc) (ref 250–450)

## 2019-06-17 LAB — HEMOGLOBIN A1C
Hgb A1c MFr Bld: 6.2 % of total Hgb — ABNORMAL HIGH (ref <5.7)
Mean Plasma Glucose: 131 (calc)
eAG (mmol/L): 7.3 (calc)

## 2019-06-17 LAB — FERRITIN: Ferritin: 149 ng/mL (ref 16–288)

## 2019-06-17 LAB — TSH: TSH: 1.87 mIU/L (ref 0.40–4.50)

## 2019-06-23 ENCOUNTER — Ambulatory Visit (INDEPENDENT_AMBULATORY_CARE_PROVIDER_SITE_OTHER): Payer: Medicare Other | Admitting: Internal Medicine

## 2019-06-23 ENCOUNTER — Other Ambulatory Visit: Payer: Self-pay

## 2019-06-23 ENCOUNTER — Encounter: Payer: Self-pay | Admitting: Internal Medicine

## 2019-06-23 VITALS — BP 140/70 | HR 70 | Temp 97.8°F | Ht 59.5 in | Wt 144.0 lb

## 2019-06-23 DIAGNOSIS — N1831 Chronic kidney disease, stage 3a: Secondary | ICD-10-CM

## 2019-06-23 DIAGNOSIS — J029 Acute pharyngitis, unspecified: Secondary | ICD-10-CM

## 2019-06-23 DIAGNOSIS — M059 Rheumatoid arthritis with rheumatoid factor, unspecified: Secondary | ICD-10-CM

## 2019-06-23 DIAGNOSIS — Z862 Personal history of diseases of the blood and blood-forming organs and certain disorders involving the immune mechanism: Secondary | ICD-10-CM

## 2019-06-23 DIAGNOSIS — F411 Generalized anxiety disorder: Secondary | ICD-10-CM

## 2019-06-23 DIAGNOSIS — D649 Anemia, unspecified: Secondary | ICD-10-CM | POA: Diagnosis not present

## 2019-06-23 DIAGNOSIS — Z0000 Encounter for general adult medical examination without abnormal findings: Secondary | ICD-10-CM

## 2019-06-23 DIAGNOSIS — I10 Essential (primary) hypertension: Secondary | ICD-10-CM | POA: Diagnosis not present

## 2019-06-23 DIAGNOSIS — Z8739 Personal history of other diseases of the musculoskeletal system and connective tissue: Secondary | ICD-10-CM

## 2019-06-23 DIAGNOSIS — R7302 Impaired glucose tolerance (oral): Secondary | ICD-10-CM

## 2019-06-23 DIAGNOSIS — E7800 Pure hypercholesterolemia, unspecified: Secondary | ICD-10-CM

## 2019-06-23 DIAGNOSIS — R0989 Other specified symptoms and signs involving the circulatory and respiratory systems: Secondary | ICD-10-CM

## 2019-06-23 DIAGNOSIS — G4762 Sleep related leg cramps: Secondary | ICD-10-CM

## 2019-06-23 LAB — POCT URINALYSIS DIPSTICK
Appearance: NEGATIVE
Bilirubin, UA: NEGATIVE
Blood, UA: NEGATIVE
Glucose, UA: NEGATIVE
Ketones, UA: NEGATIVE
Leukocytes, UA: NEGATIVE
Nitrite, UA: NEGATIVE
Odor: NEGATIVE
Protein, UA: NEGATIVE
Spec Grav, UA: 1.01 (ref 1.010–1.025)
Urobilinogen, UA: 0.2 E.U./dL
pH, UA: 6.5 (ref 5.0–8.0)

## 2019-06-23 MED ORDER — TRIAMCINOLONE 0.1 % CREAM:EUCERIN CREAM 1:1: 1.0000 "application " | TOPICAL_CREAM | Freq: Two times a day (BID) | CUTANEOUS | 1 refills | Status: DC

## 2019-06-23 MED ORDER — AZITHROMYCIN 250 MG PO TABS: ORAL_TABLET | ORAL | 0 refills | Status: DC

## 2019-06-23 MED ORDER — NEOMYCIN-POLYMYXIN-HC 3.5-10000-1 OT SOLN: 4.0000 [drp] | Freq: Four times a day (QID) | OTIC | 0 refills | Status: DC

## 2019-06-23 NOTE — Progress Notes (Signed)
Subjective:    Patient ID: [name], female    DOB: November 07, 1940, 79 y.o.   MRN: [id number]  HPI  79 year old Female with multiple medical issues.  History of 80s mellitus, hypertension, nocturnal leg cramps, epigastric pain treated with PPI, iron deficiency anemia treated with iron therapy, chronic kidney disease, hyperlipidemia, rheumatoid arthritis.  Has annual eye exam with Dr. Marshall Cork.  Has history of glaucoma.  Longstanding history of iron deficiency anemia seen by Dr. Jana Hakim.  History of elevated platelet count likely inflammatory reaction from rheumatoid arthritis.  She is followed by rheumatologist, Dr. Dossie Der.  History of abdominal bloating/constipation treated with MiraLAX.  History of pure hypercholesterolemia.  Impaired glucose tolerance-watches her diet  History of bilateral carotid bruits  Had colonoscopy by Dr. Watt Climes in 2014 showing chronic diverticulosis.  History of esophageal stricture and gastritis on endoscopy.  Has had some issues with hypokalemia.  Recent sodium however was normal at 136.  History of laparoscopic vaginal hysterectomy BSO A&P repair August 2016 by Dr. Matthew Saras.  Was found to be anemic postoperatively at that time and persistently.  Iron level was 21.  Had repeat endoscopy February 2017 by Dr. Watt Climes showing small hiatal hernia and chronic gastritis which was biopsied.  Multiple gastric polyps were biopsied.  Pathology was negative for H. pylori.  Chronic PPI change noted.  She underwent right S 1-S2 laminectomy with decompressive lumbar laminectomy L 4-L5 and L5-S1 as well as S1-S2 January 2012 by Dr. Sherley Bounds.  Postoperatively she had pulmonary embolus.  After that she had a viral syndrome and was hospitalized in February 2012.  Rheumatoid arthritis is stable at the present time.  T7 compression fracture 2000.  History of abnormal Pap smear 1995 with colposcopy.  Bilateral tubal ligation 1979.  D&C for menorrhagia in 1999.   Marsupialization of Bartholin cyst 1997.  History of H. pylori gastritis July 2000.  Social history: She is retired.  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  Does not smoke or consume alcohol.  She is a widow.  She resides with her daughter, Mateo Flow.  Family history: Mother died of an MI with history of hypertension and was 81 years of age at the time of her death.  1 brother with history of adult onset diabetes, 5 sisters one of them has diabetes.  1 daughter died of occult malignancy.  1 brother in good health.  She has 9 children.  1 daughter with history of pulmonary sarcoidosis years ago that resolved.  Patient has history of degenerative arthritis left thumb CMC joint  History of left wrist de Quervain's tendinitis   Review of Systems Had diabetic eye exam in December. Had negative Cologard test Jan 2020.Had flu vaccine     Objective:   Physical Exam Vital signs reviewed blood pressure 140/70 pulse temperature 97.8 pulse oximetry 99% BMI 28.60 weight 144 pounds.  Skin warm and dry.  Nodes none.  Neck is supple without JVD or thyromegaly.  Has soft bilateral carotid bruits.  Chest clear to auscultation.  Cardiac exam regular rate and rhythm normal S1 and S2 without murmurs or gallops.  Abdomen soft nondistended without hepatosplenomegaly masses or tenderness.  GYN exam deferred status post hysterectomy BSO  Mood is somewhat anxious.  Behavior is normal.  No gross focal deficits on brief neurological exam.       Assessment & Plan:  Rheumatoid arthritis treated by Dr. Dossie Der, rheumatologist with [facility]  History  of chronic iron deficiency previously evaluated by hematologist and treated with iron therapy and stable.  Has had previous colonoscopy with no evidence of bleeding  History of chronic gastritis tested negative for H. pylori in the past.  History of hyponatremia-resolved after stopping Maxide  History of chronic  kidney disease with history of elevated serum creatinine but this is stable as well  Bilateral shoulder arthroplasties  Nocturnal leg cramps treated with gabapentin  Essential hypertension stable on current regimen  Impaired glucose tolerance stable  Pure hypercholesterolemia treated with statin  History of bilateral carotid bruits left carotid 1 to 39% stenosis and right carotid 1 to 39% stenosis in 2019  Anxiety state treated with Xanax  History of nocturnal leg cramps treated with Neurontin  Plan: Continue current medications and follow-up in 6 months.  She is complaining of sore throat today.  Treat with Zithromax Z-PAK.  Refill Terazol 7 vaginal cream to use for recurrent Candida vaginitis.  Subjective:   Patient presents for Medicare Annual/Subsequent preventive examination.  Review Past Medical/Family/Social: See above   Risk Factors  Current exercise habits: Not a lot of exercise mostly sedentary Dietary issues discussed: Low-fat low carbohydrate  Cardiac risk factors: Hyperlipidemia, impaired glucose tolerance, family history mother  Depression Screen  (Note: if answer to either of the following is "Yes", a more complete depression screening is indicated)   Over the past two weeks, have you felt down, depressed or hopeless? No  Over the past two weeks, have you felt little interest or pleasure in doing things? No Have you lost interest or pleasure in daily life? No Do you often feel hopeless? No Do you cry easily over simple problems? No   Activities of Daily Living  In your present state of health, do you have any difficulty performing the following activities?:   Driving? No  Managing money? No  Feeding yourself? No  Getting from bed to chair? No  Climbing a flight of stairs? No  Preparing food and eating?: No  Bathing or showering? No  Getting dressed: No  Getting to the toilet? No  Using the toilet:No  Moving around from place to place: No  In the  past year have you fallen or had a near fall?:No  Are you sexually active? No  Do you have more than one partner? No   Hearing Difficulties: No  Do you often ask people to speak up or repeat themselves? No  Do you experience ringing or noises in your ears? No  Do you have difficulty understanding soft or whispered voices? No  Do you feel that you have a problem with memory? No Do you often misplace items? No    Home Safety:  Do you have a smoke alarm at your residence? Yes Do you have grab bars in the bathroom?  None Do you have throw rugs in your house?  None   Cognitive Testing  Alert? Yes Normal Appearance?Yes  Oriented to person? Yes Place? Yes  Time? Yes  Recall of three objects? Yes  Can perform simple calculations? Yes  Displays appropriate judgment?Yes  Can read the correct time from a watch face?Yes   List the Names of Other Physician/Practitioners you currently use:  See referral list for the physicians patient is currently seeing.     Review of Systems: See above   Objective:     General appearance: Appears younger than stated age Head: Normocephalic, without obvious abnormality, atraumatic  Eyes: conj clear, EOMi PEERLA  Ears: normal TM's  and external ear canals both ears  Nose: Nares normal. Septum midline. Mucosa normal. No drainage or sinus tenderness.  Throat: lips, mucosa, and tongue normal; teeth and gums normal  Neck: no adenopathy, no carotid bruit, no JVD, supple, symmetrical, trachea midline and thyroid not enlarged, symmetric, no tenderness/mass/nodules  No CVA tenderness.  Lungs: clear to auscultation bilaterally  Breasts: normal appearance, no masses or tenderness, top of the pacemaker on left upper chest. Incision well-healed. It is tender.  Heart: regular rate and rhythm, S1, S2 normal, no murmur, click, rub or gallop  Abdomen: soft, non-tender; bowel sounds normal; no masses, no organomegaly  Musculoskeletal: ROM normal in all joints, no  crepitus, no deformity, Normal muscle strengthen. Back  is symmetric, no curvature. Skin: Skin color, texture, turgor normal. No rashes or lesions  Lymph nodes: Cervical, supraclavicular, and axillary nodes normal.  Neurologic: CN 2 -12 Normal, Normal symmetric reflexes. Normal coordination and gait  Psych: Alert & Oriented x 3, Mood appear stable.    Assessment:    Annual wellness medicare exam   Plan:    During the course of the visit the patient was educated and counseled about appropriate screening and preventive services including:   Recommend annual flu vaccine  Recommend COVID-19 vaccine when available     Patient Instructions (the written plan) was given to the patient.  Medicare Attestation  I have personally reviewed:  The patient's medical and social history  Their use of alcohol, tobacco or illicit drugs  Their current medications and supplements  The patient's functional ability including ADLs,fall risks, home safety risks, cognitive, and hearing and visual impairment  Diet and physical activities  Evidence for depression or mood disorders  The patient's weight, height, BMI, and visual acuity have been recorded in the chart. I have made referrals, counseling, and provided education to the patient based on review of the above and I have provided the patient with a written personalized care plan for preventive services.

## 2019-06-24 ENCOUNTER — Telehealth: Payer: Self-pay | Admitting: Internal Medicine

## 2019-06-24 LAB — URIC ACID: Uric Acid, Serum: 8.4 mg/dL — ABNORMAL HIGH (ref 2.5–7.0)

## 2019-06-24 NOTE — Telephone Encounter (Signed)
Faxed Uric Acid results to Dr Sabino Niemann fax number [phone] and phone number is [phone]

## 2019-07-01 ENCOUNTER — Other Ambulatory Visit: Payer: Self-pay

## 2019-07-01 MED ORDER — TRIAMCINOLONE 0.1 % CREAM:EUCERIN CREAM 1:1: 1.0000 "application " | TOPICAL_CREAM | Freq: Two times a day (BID) | CUTANEOUS | 1 refills | Status: DC

## 2019-07-01 MED ORDER — TRIAMCINOLONE ACETONIDE 0.1 % EX CREA: 1.0000 "application " | TOPICAL_CREAM | Freq: Two times a day (BID) | CUTANEOUS | 3 refills | Status: DC

## 2019-07-13 DIAGNOSIS — E790 Hyperuricemia without signs of inflammatory arthritis and tophaceous disease: Secondary | ICD-10-CM | POA: Diagnosis not present

## 2019-07-13 DIAGNOSIS — Z79899 Other long term (current) drug therapy: Secondary | ICD-10-CM | POA: Diagnosis not present

## 2019-07-13 DIAGNOSIS — M1990 Unspecified osteoarthritis, unspecified site: Secondary | ICD-10-CM | POA: Diagnosis not present

## 2019-07-13 DIAGNOSIS — G5600 Carpal tunnel syndrome, unspecified upper limb: Secondary | ICD-10-CM | POA: Diagnosis not present

## 2019-07-13 DIAGNOSIS — M059 Rheumatoid arthritis with rheumatoid factor, unspecified: Secondary | ICD-10-CM | POA: Diagnosis not present

## 2019-07-18 ENCOUNTER — Telehealth: Payer: Self-pay | Admitting: Internal Medicine

## 2019-07-18 DIAGNOSIS — N1831 Chronic kidney disease, stage 3a: Secondary | ICD-10-CM

## 2019-07-18 DIAGNOSIS — I10 Essential (primary) hypertension: Secondary | ICD-10-CM

## 2019-07-18 MED ORDER — HYDROCHLOROTHIAZIDE 25 MG PO TABS: 25.0000 mg | ORAL_TABLET | Freq: Every day | ORAL | 3 refills | Status: DC

## 2019-07-18 NOTE — Telephone Encounter (Signed)
Received Fax RX request from  [facility] [place], [place] - [address] Phone:  [phone]  Fax:  [phone]       Medication - hydrochlorothiazide (HYDRODIURIL) 25 MG tablet   Last Refill - 04/22/19  Last OV - 06/23/19  Last CPE - 06/23/19  Next Appointment -

## 2019-07-18 NOTE — Telephone Encounter (Signed)
Refill current diuretic for one year

## 2019-07-22 NOTE — Patient Instructions (Addendum)
It was a pleasure to see you today.  Continue current medications and follow-up in 6 months.  Take COVID-19 vaccine when available.  Take Zithromax Z-PAK as directed for pharyngitis.

## 2019-08-04 ENCOUNTER — Ambulatory Visit: Payer: Medicare Other | Attending: Internal Medicine

## 2019-08-04 DIAGNOSIS — Z23 Encounter for immunization: Secondary | ICD-10-CM

## 2019-08-04 NOTE — Progress Notes (Signed)
   Covid-19 Vaccination Clinic  Name:  [name]    MRN: [id number] DOB: 15-Aug-1940  08/04/2019  [name] was observed post Covid-19 immunization for 15 minutes without incident. She was provided with Vaccine Information Sheet and instruction to access the V-Safe system.   [name] was instructed to call 911 with any severe reactions post vaccine: Marland Kitchen Difficulty breathing  . Swelling of face and throat  . A fast heartbeat  . A bad rash all over body  . Dizziness and weakness   Immunizations Administered    Name Date Dose VIS Date Route   Pfizer COVID-19 Vaccine 08/04/2019 12:54 PM 0.3 mL 05/06/2019 Intramuscular   Manufacturer: [place]   Lot: [id number]   [place]: [id number]

## 2019-08-25 ENCOUNTER — Telehealth (INDEPENDENT_AMBULATORY_CARE_PROVIDER_SITE_OTHER): Payer: Medicare Other | Admitting: Internal Medicine

## 2019-08-25 ENCOUNTER — Telehealth: Payer: Self-pay

## 2019-08-25 ENCOUNTER — Encounter: Payer: Self-pay | Admitting: Internal Medicine

## 2019-08-25 ENCOUNTER — Other Ambulatory Visit: Payer: Self-pay

## 2019-08-25 VITALS — Temp 97.5°F | Ht 59.5 in | Wt 144.0 lb

## 2019-08-25 DIAGNOSIS — J029 Acute pharyngitis, unspecified: Secondary | ICD-10-CM

## 2019-08-25 DIAGNOSIS — I889 Nonspecific lymphadenitis, unspecified: Secondary | ICD-10-CM | POA: Diagnosis not present

## 2019-08-25 MED ORDER — AZITHROMYCIN 250 MG PO TABS: ORAL_TABLET | ORAL | 0 refills | Status: DC

## 2019-08-25 MED ORDER — PREDNISONE 10 MG PO TABS: ORAL_TABLET | ORAL | 0 refills | Status: DC

## 2019-08-25 MED ORDER — TERCONAZOLE 0.8 % VA CREA: TOPICAL_CREAM | VAGINAL | 1 refills | Status: DC

## 2019-08-25 NOTE — Telephone Encounter (Signed)
She said she has had one 3/11, she will do virtual visit at 4:00pm.

## 2019-08-25 NOTE — Telephone Encounter (Signed)
Patient called had a sore throat last night and this morning she woke up with knots on her neck under her chin she said they don't hurt but she just worried. She said last time you called an antibiotic for this. She wants to know if you can see her or send her in something. She said she has had both of her vaccines.

## 2019-08-25 NOTE — Telephone Encounter (Signed)
[name], has she had her Covid vaccines and when? That is the first question you should ask.No we cannot call something in. She needs virtual visit.

## 2019-08-29 ENCOUNTER — Ambulatory Visit: Payer: Self-pay

## 2019-09-11 NOTE — Patient Instructions (Signed)
Take Zithromax Z-PAK 2 p.o. day 1 followed by 1 p.o. days 2 through 5.  Use Terazol 7 vaginal cream at bedtime if needed for Candida vaginitis while on antibiotics.  Rest and drink plenty of fluids.

## 2019-09-11 NOTE — Progress Notes (Signed)
   Subjective:    Patient ID: [name], female    DOB: 1941-01-03, 79 y.o.   MRN: [id number]  HPI 80 year old Female seen today via interactive audio and video telecommunications due to the Coronavirus pandemic.  She is identified using 2 identifiers as [name], a patient in this practice.  She is agreeable to visit in this format today.  Patient complains of mild sore throat that started last night and has persisted today.  Complaining of "knots "on her neck.  She is of worried and has history of anxiety.  She has had 1 Covid vaccine on March 11.  No travel history.  No known COVID-19 exposure.        Review of Systems no nausea vomiting documented fever chills diarrhea or headache.  No myalgias.     Objective:   Physical Exam Reports that she is afebrile.  Looks to be in no acute distress but complaining of cervical adenopathy and sore throat.       Assessment & Plan:  Acute pharyngitis  Cervical adenitis  Plan: Zithromax Z-PAK take 2 p.o. day 1 followed by 1 p.o. days 2 through 5.  Rest and drink plenty of fluids.  Refill Terazol 7 vaginal cream in case she gets Candida vaginitis while on antibiotic therapy.

## 2019-09-21 ENCOUNTER — Ambulatory Visit: Payer: Medicare Other | Attending: Internal Medicine

## 2019-09-21 DIAGNOSIS — Z23 Encounter for immunization: Secondary | ICD-10-CM

## 2019-09-21 NOTE — Progress Notes (Signed)
   Covid-19 Vaccination Clinic  Name:  [name]    MRN: [id number] DOB: 06-07-1940  09/21/2019  [name] was observed post Covid-19 immunization for 15 minutes without incident. She was provided with Vaccine Information Sheet and instruction to access the V-Safe system.   [name] was instructed to call 911 with any severe reactions post vaccine: Marland Kitchen Difficulty breathing  . Swelling of face and throat  . A fast heartbeat  . A bad rash all over body  . Dizziness and weakness   Immunizations Administered    Name Date Dose VIS Date Route   Pfizer COVID-19 Vaccine 09/21/2019  2:28 PM 0.3 mL 07/20/2018 Intramuscular   Manufacturer: [place]   Lot: [id number]   [place]: [id number]

## 2019-09-28 DIAGNOSIS — H402211 Chronic angle-closure glaucoma, right eye, mild stage: Secondary | ICD-10-CM | POA: Diagnosis not present

## 2019-09-28 DIAGNOSIS — H402222 Chronic angle-closure glaucoma, left eye, moderate stage: Secondary | ICD-10-CM | POA: Diagnosis not present

## 2019-10-11 DIAGNOSIS — Z79899 Other long term (current) drug therapy: Secondary | ICD-10-CM | POA: Diagnosis not present

## 2019-10-11 DIAGNOSIS — M1990 Unspecified osteoarthritis, unspecified site: Secondary | ICD-10-CM | POA: Diagnosis not present

## 2019-10-11 DIAGNOSIS — G5600 Carpal tunnel syndrome, unspecified upper limb: Secondary | ICD-10-CM | POA: Diagnosis not present

## 2019-10-11 DIAGNOSIS — E790 Hyperuricemia without signs of inflammatory arthritis and tophaceous disease: Secondary | ICD-10-CM | POA: Diagnosis not present

## 2019-10-11 DIAGNOSIS — M059 Rheumatoid arthritis with rheumatoid factor, unspecified: Secondary | ICD-10-CM | POA: Diagnosis not present

## 2019-10-20 DIAGNOSIS — M059 Rheumatoid arthritis with rheumatoid factor, unspecified: Secondary | ICD-10-CM | POA: Diagnosis not present

## 2019-10-20 DIAGNOSIS — Z79899 Other long term (current) drug therapy: Secondary | ICD-10-CM | POA: Diagnosis not present

## 2019-10-20 DIAGNOSIS — E790 Hyperuricemia without signs of inflammatory arthritis and tophaceous disease: Secondary | ICD-10-CM | POA: Diagnosis not present

## 2019-10-20 DIAGNOSIS — M1990 Unspecified osteoarthritis, unspecified site: Secondary | ICD-10-CM | POA: Diagnosis not present

## 2019-10-20 DIAGNOSIS — G5600 Carpal tunnel syndrome, unspecified upper limb: Secondary | ICD-10-CM | POA: Diagnosis not present

## 2019-10-25 ENCOUNTER — Other Ambulatory Visit: Payer: Self-pay

## 2019-10-25 MED ORDER — MUPIROCIN 2 % EX OINT: TOPICAL_OINTMENT | CUTANEOUS | 11 refills | Status: DC

## 2019-11-09 ENCOUNTER — Other Ambulatory Visit: Payer: Self-pay

## 2019-11-09 MED ORDER — ATORVASTATIN CALCIUM 20 MG PO TABS: 20.0000 mg | ORAL_TABLET | Freq: Every day | ORAL | 1 refills | Status: DC

## 2019-11-09 MED ORDER — ESOMEPRAZOLE MAGNESIUM 40 MG PO CPDR: DELAYED_RELEASE_CAPSULE | ORAL | 1 refills | Status: DC

## 2019-11-09 NOTE — Telephone Encounter (Signed)
Scheduled CPE in February.

## 2019-12-05 DIAGNOSIS — Z1231 Encounter for screening mammogram for malignant neoplasm of breast: Secondary | ICD-10-CM | POA: Diagnosis not present

## 2019-12-05 LAB — HM MAMMOGRAPHY

## 2019-12-07 ENCOUNTER — Encounter: Payer: Self-pay | Admitting: Internal Medicine

## 2019-12-09 ENCOUNTER — Other Ambulatory Visit: Payer: Medicare Other | Admitting: Internal Medicine

## 2019-12-09 ENCOUNTER — Other Ambulatory Visit: Payer: Self-pay

## 2019-12-09 DIAGNOSIS — N1831 Chronic kidney disease, stage 3a: Secondary | ICD-10-CM | POA: Diagnosis not present

## 2019-12-09 DIAGNOSIS — M059 Rheumatoid arthritis with rheumatoid factor, unspecified: Secondary | ICD-10-CM

## 2019-12-09 DIAGNOSIS — Z0000 Encounter for general adult medical examination without abnormal findings: Secondary | ICD-10-CM

## 2019-12-09 DIAGNOSIS — I10 Essential (primary) hypertension: Secondary | ICD-10-CM

## 2019-12-09 DIAGNOSIS — E7800 Pure hypercholesterolemia, unspecified: Secondary | ICD-10-CM | POA: Diagnosis not present

## 2019-12-09 DIAGNOSIS — R7302 Impaired glucose tolerance (oral): Secondary | ICD-10-CM | POA: Diagnosis not present

## 2019-12-10 LAB — LIPID PANEL
Cholesterol: 166 mg/dL (ref <200)
HDL: 61 mg/dL (ref >=50)
LDL Cholesterol (Calc): 91 mg/dL (calc)
Non-HDL Cholesterol (Calc): 105 mg/dL (calc) (ref <130)
Total CHOL/HDL Ratio: 2.7 (calc) (ref <5.0)
Triglycerides: 48 mg/dL (ref <150)

## 2019-12-10 LAB — HEPATIC FUNCTION PANEL
AG Ratio: 1.4 (calc) (ref 1.0–2.5)
ALT: 18 U/L (ref 6–29)
AST: 30 U/L (ref 10–35)
Albumin: 3.9 g/dL (ref 3.6–5.1)
Alkaline phosphatase (APISO): 58 U/L (ref 37–153)
Bilirubin, Direct: 0.1 mg/dL (ref 0.0–0.2)
Globulin: 2.7 g/dL (calc) (ref 1.9–3.7)
Indirect Bilirubin: 0.2 mg/dL (calc) (ref 0.2–1.2)
Total Bilirubin: 0.3 mg/dL (ref 0.2–1.2)
Total Protein: 6.6 g/dL (ref 6.1–8.1)

## 2019-12-10 LAB — HEMOGLOBIN A1C
Hgb A1c MFr Bld: 6.2 % of total Hgb — ABNORMAL HIGH (ref <5.7)
Mean Plasma Glucose: 131 (calc)
eAG (mmol/L): 7.3 (calc)

## 2019-12-10 LAB — SPECIMEN COMPROMISED

## 2019-12-12 ENCOUNTER — Other Ambulatory Visit: Payer: Self-pay

## 2019-12-12 ENCOUNTER — Encounter: Payer: Self-pay | Admitting: Internal Medicine

## 2019-12-12 ENCOUNTER — Ambulatory Visit: Payer: Medicare Other | Admitting: Internal Medicine

## 2019-12-12 ENCOUNTER — Ambulatory Visit (INDEPENDENT_AMBULATORY_CARE_PROVIDER_SITE_OTHER): Payer: Medicare Other | Admitting: Internal Medicine

## 2019-12-12 VITALS — BP 100/60 | HR 64 | Ht 59.5 in | Wt 151.0 lb

## 2019-12-12 DIAGNOSIS — Z862 Personal history of diseases of the blood and blood-forming organs and certain disorders involving the immune mechanism: Secondary | ICD-10-CM

## 2019-12-12 DIAGNOSIS — E7800 Pure hypercholesterolemia, unspecified: Secondary | ICD-10-CM | POA: Diagnosis not present

## 2019-12-12 DIAGNOSIS — Z8739 Personal history of other diseases of the musculoskeletal system and connective tissue: Secondary | ICD-10-CM

## 2019-12-12 DIAGNOSIS — N1831 Chronic kidney disease, stage 3a: Secondary | ICD-10-CM | POA: Diagnosis not present

## 2019-12-12 DIAGNOSIS — I10 Essential (primary) hypertension: Secondary | ICD-10-CM

## 2019-12-12 DIAGNOSIS — D649 Anemia, unspecified: Secondary | ICD-10-CM | POA: Diagnosis not present

## 2019-12-12 DIAGNOSIS — R7302 Impaired glucose tolerance (oral): Secondary | ICD-10-CM

## 2019-12-12 NOTE — Progress Notes (Signed)
   Subjective:    Patient ID: [name], female    DOB: January 17, 1941, 79 y.o.   MRN: [id number]  HPI 79 year old Female here for 78-month follow-up appointment.  Patient has a history of rheumatoid arthritis, type 2 diabetes mellitus, essential hypertension, chronic kidney disease, history of iron deficiency anemia, anxiety, GE reflux and irritable bowel syndrome.  Also has history of hyperlipidemia.  Currently not on DMARD for rheumatoid arthritis.  Is followed by rheumatologist.  Remains on iron supplementation for anemia.  History of elevated uric acid in January 2021 with level being 8.4 normal being up to 7.  Hemoglobin A1c is stable at 6.2%.  Lipid panel and liver functions are normal.    Review of Systems complaining of URI symptoms including nasal stuffiness. Just stopped prednisone per rheumatologist a couple of weeks ago. No longer on any DMARD.     Objective:   Physical Exam Blood pressure 100/60 pulse 64 pulse oximetry 99% weight 151 pounds height 4 feet 11.5 inches  Skin warm and dry.  Nodes none.  Neck is supple.  No thyromegaly or carotid bruits.  Chest clear to auscultation without rales or wheezing.  Cardiac exam regular rate and rhythm normal S1 and S2.  No lower extremity pitting edema.       Assessment & Plan:  Essential hypertension-stable on current regimen.  She takes Zestril and HCTZ.  Rheumatoid arthritis-treated by rheumatologist  Impaired glucose tolerance-hemoglobin A1c excellent at 6.2%  Normocytic anemia-continues to take iron therapy this is likely to partially be due to of anemia of chronic disease in addition to a history of iron deficiency  History of gout  History of GE reflux treated with Nexium.  History of chronic kidney disease-creatinine was 1.20 in January of this year was not repeated with this visit  Pure hypercholesterolemia-lipid panel is normal.  Continue Lipitor 20 mg daily.  Plan: This is the best I have seen her in some time.   All of her medical issues seem to be under good control and stable and she has essentially no complaints except for respiratory congestion.  I have asked her to call if she has persistent respiratory congestion.  She does not go anywhere to have COVID-19 exposure.  She stays at home with her family.  No one at home is sick.

## 2019-12-12 NOTE — Patient Instructions (Signed)
It was a pleasure to see you today.  No change in medications.  Follow-up in 6 months.

## 2020-01-14 ENCOUNTER — Encounter: Payer: Self-pay | Admitting: Internal Medicine

## 2020-01-19 ENCOUNTER — Telehealth: Payer: Self-pay | Admitting: Internal Medicine

## 2020-01-19 DIAGNOSIS — M059 Rheumatoid arthritis with rheumatoid factor, unspecified: Secondary | ICD-10-CM | POA: Diagnosis not present

## 2020-01-19 DIAGNOSIS — E790 Hyperuricemia without signs of inflammatory arthritis and tophaceous disease: Secondary | ICD-10-CM | POA: Diagnosis not present

## 2020-01-19 DIAGNOSIS — M1990 Unspecified osteoarthritis, unspecified site: Secondary | ICD-10-CM | POA: Diagnosis not present

## 2020-01-19 DIAGNOSIS — Z79899 Other long term (current) drug therapy: Secondary | ICD-10-CM | POA: Diagnosis not present

## 2020-01-19 DIAGNOSIS — M25531 Pain in right wrist: Secondary | ICD-10-CM | POA: Diagnosis not present

## 2020-01-19 NOTE — Telephone Encounter (Addendum)
Please call Rheumatology office at [facility] and ask them about elevated lab result that has patient disturbed.  Maybe they can fax [place] notes/labs from recent visit. If we have it, it may have gone to scan center. Don't see recent note in Media.I do see a note from May. She was started on prednisone then and before had been running platelets in 800k range and has been anemic for some time. Has seen Dr. Jana Hakim previously but has been a while. Hx rheumatoid arthritis with CCP greater than 250.

## 2020-01-19 NOTE — Telephone Encounter (Signed)
Called patient and daughter. They are coming tomorrow. They will come at 10am for STAT  labs and then RTC at 4 pm for ov regarding thromobocytosis. She currently is on prednisone per Dr. Dossie Der Rheumatologist for RA and "sinus" issues she says.

## 2020-01-19 NOTE — Telephone Encounter (Signed)
[name] [phone]  Sharlena called to say that Rheumatology called her a told her that her that something was 1293, the best I can tell from lab results that we just received it is  F PLT 1293 HH  Also her daughter [name]) called and would like to talk with you, she stated that her mother is continually taking medication to clean herself out, like every other day. She said that her mother is living with her now. She is not on her mothers DPR at this time. I suggested that we have an office visit to discuss this.

## 2020-01-20 ENCOUNTER — Encounter: Payer: Self-pay | Admitting: Internal Medicine

## 2020-01-20 ENCOUNTER — Ambulatory Visit (INDEPENDENT_AMBULATORY_CARE_PROVIDER_SITE_OTHER): Payer: Medicare Other | Admitting: Internal Medicine

## 2020-01-20 ENCOUNTER — Other Ambulatory Visit: Payer: Self-pay

## 2020-01-20 ENCOUNTER — Other Ambulatory Visit: Payer: Medicare Other | Admitting: Internal Medicine

## 2020-01-20 VITALS — BP 140/80 | HR 70 | Ht 59.5 in | Wt 150.0 lb

## 2020-01-20 DIAGNOSIS — Z8739 Personal history of other diseases of the musculoskeletal system and connective tissue: Secondary | ICD-10-CM

## 2020-01-20 DIAGNOSIS — I10 Essential (primary) hypertension: Secondary | ICD-10-CM

## 2020-01-20 DIAGNOSIS — N1831 Chronic kidney disease, stage 3a: Secondary | ICD-10-CM

## 2020-01-20 DIAGNOSIS — E7800 Pure hypercholesterolemia, unspecified: Secondary | ICD-10-CM

## 2020-01-20 DIAGNOSIS — F411 Generalized anxiety disorder: Secondary | ICD-10-CM

## 2020-01-20 DIAGNOSIS — D473 Essential (hemorrhagic) thrombocythemia: Secondary | ICD-10-CM | POA: Diagnosis not present

## 2020-01-20 DIAGNOSIS — R7302 Impaired glucose tolerance (oral): Secondary | ICD-10-CM

## 2020-01-20 DIAGNOSIS — Z862 Personal history of diseases of the blood and blood-forming organs and certain disorders involving the immune mechanism: Secondary | ICD-10-CM

## 2020-01-20 DIAGNOSIS — M059 Rheumatoid arthritis with rheumatoid factor, unspecified: Secondary | ICD-10-CM | POA: Diagnosis not present

## 2020-01-20 DIAGNOSIS — D75839 Thrombocytosis, unspecified: Secondary | ICD-10-CM

## 2020-01-20 LAB — CBC WITH DIFFERENTIAL/PLATELET
Absolute Monocytes: 1346 cells/uL — ABNORMAL HIGH (ref 200–950)
Basophils Absolute: 25 cells/uL (ref 0–200)
Basophils Relative: 0.2 %
Eosinophils Absolute: 0 cells/uL — ABNORMAL LOW (ref 15–500)
Eosinophils Relative: 0 %
HCT: 29.9 % — ABNORMAL LOW (ref 35.0–45.0)
Hemoglobin: 10 g/dL — ABNORMAL LOW (ref 11.7–15.5)
Lymphs Abs: 2019 cells/uL (ref 850–3900)
MCH: 33.1 pg — ABNORMAL HIGH (ref 27.0–33.0)
MCHC: 33.4 g/dL (ref 32.0–36.0)
MCV: 99 fL (ref 80.0–100.0)
MPV: 8.6 fL (ref 7.5–12.5)
Monocytes Relative: 10.6 %
Neutro Abs: 9309 cells/uL — ABNORMAL HIGH (ref 1500–7800)
Neutrophils Relative %: 73.3 %
Platelets: 1208 10*3/uL — ABNORMAL HIGH (ref 140–400)
RBC: 3.02 10*6/uL — ABNORMAL LOW (ref 3.80–5.10)
RDW: 14.7 % (ref 11.0–15.0)
Total Lymphocyte: 15.9 %
WBC: 12.7 10*3/uL — ABNORMAL HIGH (ref 3.8–10.8)

## 2020-01-20 LAB — COMPLETE METABOLIC PANEL WITH GFR
AG Ratio: 1.4 (calc) (ref 1.0–2.5)
ALT: 20 U/L (ref 6–29)
AST: 26 U/L (ref 10–35)
Albumin: 4.1 g/dL (ref 3.6–5.1)
Alkaline phosphatase (APISO): 72 U/L (ref 37–153)
BUN/Creatinine Ratio: 20 (calc) (ref 6–22)
BUN: 23 mg/dL (ref 7–25)
CO2: 25 mmol/L (ref 20–32)
Calcium: 9.7 mg/dL (ref 8.6–10.4)
Chloride: 95 mmol/L — ABNORMAL LOW (ref 98–110)
Creat: 1.15 mg/dL — ABNORMAL HIGH (ref 0.60–0.88)
GFR, Est African American: 52 mL/min/{1.73_m2} — ABNORMAL LOW (ref >=60)
GFR, Est Non African American: 45 mL/min/{1.73_m2} — ABNORMAL LOW (ref >=60)
Globulin: 2.9 g/dL (calc) (ref 1.9–3.7)
Glucose, Bld: 111 mg/dL — ABNORMAL HIGH (ref 65–99)
Potassium: 4.9 mmol/L (ref 3.5–5.3)
Sodium: 129 mmol/L — ABNORMAL LOW (ref 135–146)
Total Bilirubin: 0.3 mg/dL (ref 0.2–1.2)
Total Protein: 7 g/dL (ref 6.1–8.1)

## 2020-01-20 NOTE — Telephone Encounter (Signed)
Received labs

## 2020-01-20 NOTE — Addendum Note (Signed)
Addended by: Mady Haagensen on: 01/20/2020 09:53 AM   Modules accepted: Orders

## 2020-01-21 NOTE — Progress Notes (Signed)
Subjective:    Patient ID: [name], female    DOB: 1940/07/05, 79 y.o.   MRN: [id number]  HPI 79 year old Female seen today regarding elevated platelet count at rheumatology office.  Patient has history of rheumatoid arthritis followed by Dr. Avon Gully at [facility].  At one point was on a monoclonal antibody inhibitor but apparently that was expensive according to Dr. Justus Memory note from May 21 of this year.  Patient stopped methotrexate due to sores in her mouth despite taking folic acid.  Tried leflunomide but could not tolerate that either.  So more recently has been taking Medrol Dosepak on a as needed basis for flares of rheumatoid arthritis.  There is a history of gout.  Apparently tried allopurinol and Uloric in the past by rheumatologist but could not tolerate those due to GI issues.  Currently not on any medication for elevated uric acid or gout.  Apparently rheumatoid arthritis affects her knees and she has steroid injections of her knees from time to time.  Patient was diagnosed with rheumatoid arthritis in 2013 affecting her wrist with CCP of 41.  Blood test in May 2019 showed CCP greater than 250.  Had knee aspiration July 2019 with white blood cell count more than 27,000 consistent with inflammatory fluid and no crystals.  Patient takes lisinopril 5 mg daily and triamterene 50 mg daily.  Is on generic Lipitor 20 mg daily and HCTZ 25 mg daily.  Sometimes gets narcotic pain medication from rheumatologist for joint pain.  She has a remote history of iron deficiency anemia seen by Dr. Jana Hakim and takes iron supplementation.  Tried Fosamax but could not tolerate that either due to GE reflux.  More recently has just been treated with prednisone intermittently as best I can tell.  We received a call from patient's daughter that patient's platelet count was elevated at 1,200,000 on recent CBC at rheumatology office.  We subsequently got CBC report confirming this and  repeated CBC today.  In February 2019 her platelet count was 631,000, hemoglobin 10.2 g her white blood cell count 8.4  In January 2020, hemoglobin was 11.5 g, white blood cell count 8100 and platelet count 898,000.  In January 2021, hemoglobin 10.4 g, white blood cell count 7000 and platelet count 829,000.  Today white blood cell count is 12,700.  She recently received an injection of steroid in her knee and apparently has been on low-dose steroids orally.  Hemoglobin is 10.0 g and MCV is 99.0 platelet count today here 1.208,000 and peripheral review of smear confirmed automated results according to lab.  Patient currently not taking aspirin but and was advised to start 325 mg of aspirin daily at this point and we will try to get in touch with Hematologist.  She is in no acute distress and feels well.  She lives with her daughter, [name].  Mateo Flow reports that patient is frequently constipated and has been taking Dulcolax two or three times a week.  Patient was advised to try daily MiraLAX instead and to only use Dulcolax if extremely constipated perhaps no more than once maybe twice a week.  Patient had colonoscopy in 2014.  She continues to take iron supplementation for longstanding history of anemia and iron deficiency.  History of GE reflux treated with Nexium.    Review of Systems     Objective:   Physical Exam Seen today in office in no acute distress but is somewhat anxious.  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  Will contact hematology regarding evaluation and treatment.  Previously I thought elevated platelet count was  due to inflammatory reaction from rheumatoid arthritis.  She has recently been on prednisone.  Seems to be increased from 600,000-900,000 and now to 1.2 million.  History of rheumatoid arthritis currently just treated with prednisone and steroid knee injections from time to time  Essential  hypertension  History of iron deficiency seen by hematologist previously  History of gout but not able to tolerate preventative medication for gout  History of GE reflux  History of constipation on iron supplement  History of anxiety  Plan: Referral to Hematology for further evaluation as soon as possible.  Take aspirin 325 mg daily.  Take MiraLAX daily and try to limit Dulcolax to once or twice weekly.

## 2020-01-21 NOTE — Patient Instructions (Signed)
Referral to Hematology regarding thrombocytosis as soon as possible.

## 2020-01-24 ENCOUNTER — Telehealth: Payer: Self-pay | Admitting: Internal Medicine

## 2020-01-24 ENCOUNTER — Telehealth: Payer: Self-pay | Admitting: Hematology

## 2020-01-24 NOTE — Telephone Encounter (Signed)
Have advised her before to take some tonic water daily which should help leg cramps.

## 2020-01-24 NOTE — Telephone Encounter (Signed)
[name] called back  and I let her know what Dr Renold Genta said, she verbalized understanding

## 2020-01-24 NOTE — Telephone Encounter (Signed)
Received a new hem referral from Dr. Renold Genta for thrombocytosis and anemia. Pt has been cld and scheduled to see Dr. Irene Limbo on 9/13 at 11am. Pt aware to arrive 15 minutes early.

## 2020-01-24 NOTE — Telephone Encounter (Signed)
LVM for patient to drink tonic water every day for leg cramps and to call back if she has any more questions

## 2020-01-24 NOTE — Telephone Encounter (Signed)
[name]  Daughter [phone]  Lowella Bandy called to say that patient woke up this morning around 3:00 am with cramp in her left leg in the lower part. She has these a lot. She got up and took some mustard and went back to bed.

## 2020-01-26 ENCOUNTER — Telehealth: Payer: Self-pay | Admitting: Hematology

## 2020-01-26 DIAGNOSIS — Z79899 Other long term (current) drug therapy: Secondary | ICD-10-CM | POA: Diagnosis not present

## 2020-01-26 DIAGNOSIS — E790 Hyperuricemia without signs of inflammatory arthritis and tophaceous disease: Secondary | ICD-10-CM | POA: Diagnosis not present

## 2020-01-26 DIAGNOSIS — M059 Rheumatoid arthritis with rheumatoid factor, unspecified: Secondary | ICD-10-CM | POA: Diagnosis not present

## 2020-01-26 DIAGNOSIS — M1990 Unspecified osteoarthritis, unspecified site: Secondary | ICD-10-CM | POA: Diagnosis not present

## 2020-01-26 DIAGNOSIS — G5600 Carpal tunnel syndrome, unspecified upper limb: Secondary | ICD-10-CM | POA: Diagnosis not present

## 2020-01-26 NOTE — Telephone Encounter (Signed)
[name] has been cld and rescheduled to see Dr. Irene Limbo on 9/7 at 12pm.

## 2020-01-31 ENCOUNTER — Inpatient Hospital Stay: Payer: Medicare Other

## 2020-01-31 ENCOUNTER — Other Ambulatory Visit: Payer: Self-pay

## 2020-01-31 ENCOUNTER — Inpatient Hospital Stay: Payer: Medicare Other | Attending: Hematology | Admitting: Hematology

## 2020-01-31 VITALS — BP 154/69 | HR 77 | Temp 97.4°F | Resp 18 | Ht 59.5 in | Wt 146.7 lb

## 2020-01-31 DIAGNOSIS — N289 Disorder of kidney and ureter, unspecified: Secondary | ICD-10-CM

## 2020-01-31 DIAGNOSIS — D72829 Elevated white blood cell count, unspecified: Secondary | ICD-10-CM

## 2020-01-31 DIAGNOSIS — R200 Anesthesia of skin: Secondary | ICD-10-CM | POA: Diagnosis not present

## 2020-01-31 DIAGNOSIS — M4802 Spinal stenosis, cervical region: Secondary | ICD-10-CM | POA: Diagnosis not present

## 2020-01-31 DIAGNOSIS — K589 Irritable bowel syndrome without diarrhea: Secondary | ICD-10-CM | POA: Diagnosis not present

## 2020-01-31 DIAGNOSIS — Z23 Encounter for immunization: Secondary | ICD-10-CM

## 2020-01-31 DIAGNOSIS — D649 Anemia, unspecified: Secondary | ICD-10-CM | POA: Diagnosis not present

## 2020-01-31 DIAGNOSIS — M069 Rheumatoid arthritis, unspecified: Secondary | ICD-10-CM | POA: Diagnosis not present

## 2020-01-31 DIAGNOSIS — Z86711 Personal history of pulmonary embolism: Secondary | ICD-10-CM | POA: Diagnosis not present

## 2020-01-31 DIAGNOSIS — D75839 Thrombocytosis, unspecified: Secondary | ICD-10-CM

## 2020-01-31 DIAGNOSIS — R7989 Other specified abnormal findings of blood chemistry: Secondary | ICD-10-CM | POA: Diagnosis not present

## 2020-01-31 DIAGNOSIS — Z7982 Long term (current) use of aspirin: Secondary | ICD-10-CM

## 2020-01-31 DIAGNOSIS — D473 Essential (hemorrhagic) thrombocythemia: Secondary | ICD-10-CM | POA: Diagnosis not present

## 2020-01-31 LAB — CBC WITH DIFFERENTIAL/PLATELET
Abs Immature Granulocytes: 0.05 10*3/uL (ref 0.00–0.07)
Basophils Absolute: 0.1 10*3/uL (ref 0.0–0.1)
Basophils Relative: 1 %
Eosinophils Absolute: 0.2 10*3/uL (ref 0.0–0.5)
Eosinophils Relative: 2 %
HCT: 27.8 % — ABNORMAL LOW (ref 36.0–46.0)
Hemoglobin: 9.4 g/dL — ABNORMAL LOW (ref 12.0–15.0)
Immature Granulocytes: 0 %
Lymphocytes Relative: 17 %
Lymphs Abs: 2 10*3/uL (ref 0.7–4.0)
MCH: 32.2 pg (ref 26.0–34.0)
MCHC: 33.8 g/dL (ref 30.0–36.0)
MCV: 95.2 fL (ref 80.0–100.0)
Monocytes Absolute: 1 10*3/uL (ref 0.1–1.0)
Monocytes Relative: 8 %
Neutro Abs: 8.4 10*3/uL — ABNORMAL HIGH (ref 1.7–7.7)
Neutrophils Relative %: 72 %
Platelets: 814 10*3/uL — ABNORMAL HIGH (ref 150–400)
RBC: 2.92 MIL/uL — ABNORMAL LOW (ref 3.87–5.11)
RDW: 15.3 % (ref 11.5–15.5)
WBC: 11.8 10*3/uL — ABNORMAL HIGH (ref 4.0–10.5)
nRBC: 0.3 % — ABNORMAL HIGH (ref 0.0–0.2)

## 2020-01-31 LAB — SEDIMENTATION RATE: Sed Rate: 72 mm/hr — ABNORMAL HIGH (ref 0–22)

## 2020-01-31 LAB — RETICULOCYTES
Immature Retic Fract: 24.3 % — ABNORMAL HIGH (ref 2.3–15.9)
RBC.: 2.92 MIL/uL — ABNORMAL LOW (ref 3.87–5.11)
Retic Count, Absolute: 51.1 10*3/uL (ref 19.0–186.0)
Retic Ct Pct: 1.8 % (ref 0.4–3.1)

## 2020-01-31 LAB — CMP (CANCER CENTER ONLY)
ALT: 18 U/L (ref 0–44)
AST: 23 U/L (ref 15–41)
Albumin: 3.6 g/dL (ref 3.5–5.0)
Alkaline Phosphatase: 78 U/L (ref 38–126)
Anion gap: 10 (ref 5–15)
BUN: 19 mg/dL (ref 8–23)
CO2: 25 mmol/L (ref 22–32)
Calcium: 9.3 mg/dL (ref 8.9–10.3)
Chloride: 91 mmol/L — ABNORMAL LOW (ref 98–111)
Creatinine: 0.96 mg/dL (ref 0.44–1.00)
GFR, Est AFR Am: >60 mL/min (ref >60)
GFR, Estimated: 55 mL/min — ABNORMAL LOW (ref >60)
Glucose, Bld: 85 mg/dL (ref 70–99)
Potassium: 3.4 mmol/L — ABNORMAL LOW (ref 3.5–5.1)
Sodium: 126 mmol/L — ABNORMAL LOW (ref 135–145)
Total Bilirubin: 0.5 mg/dL (ref 0.3–1.2)
Total Protein: 7.5 g/dL (ref 6.5–8.1)

## 2020-01-31 LAB — IRON AND TIBC
Iron: 38 ug/dL — ABNORMAL LOW (ref 41–142)
Saturation Ratios: 12 % — ABNORMAL LOW (ref 21–57)
TIBC: 318 ug/dL (ref 236–444)
UIBC: 279 ug/dL (ref 120–384)

## 2020-01-31 LAB — LACTATE DEHYDROGENASE: LDH: 320 U/L — ABNORMAL HIGH (ref 98–192)

## 2020-01-31 LAB — FERRITIN: Ferritin: 232 ng/mL (ref 11–307)

## 2020-01-31 LAB — C-REACTIVE PROTEIN: CRP: 4 mg/dL — ABNORMAL HIGH (ref <1.0)

## 2020-01-31 LAB — VITAMIN B12: Vitamin B-12: 787 pg/mL (ref 180–914)

## 2020-01-31 NOTE — Progress Notes (Signed)
HEMATOLOGY/ONCOLOGY CONSULTATION NOTE  Date of Service: 02/01/2020  Patient Care Team: [name], MD as PCP - [name], MD as Consulting Physician (Gastroenterology) Valinda Party, MD (Rheumatology)  CHIEF COMPLAINTS/PURPOSE OF CONSULTATION:  Thrombocytosis & Anemia  HISTORY OF PRESENTING ILLNESS:   [name] is a wonderful 79 y.o. female who has been referred to [place] by Dr. Renold Genta for evaluation and management of thrombocytosis and anemia. Pt is accompanied today by her daughter. The pt reports that she is doing well overall.   The pt reports that she was diagnosed with Rheumatoid Arthritis three years ago. Pt is currently being seen by Dr. Dossie Der for her RA. Pt was using IV medication for a year, likely Methotrexate, to treat her RA before she began having an allergic reaction and was stopped. She was on Prednisone to treat for two weeks and was stopped a week ago due to rash, which as resolved. She also receives Cortizone knee injections periodically, which help for a short time. Pt reports that her joint stiffness improves within 30 minutes of waking up and beginning to move about. Pt has a follow up scheduled with Dr. Dossie Der in the beginning of December.  Pt had pulmonary embolism in 2012 that was thought to be caused by back surgery. Pt was treated with Coumadin for 6 months following her PE. She denies any heart attacks or strokes. Pt is currently taking three 81 mg Aspirin daily. Pt has IBS and complains of occasional abdominal discomfort. Pt has carpal tunnel and has received local injections, but surgery is not a current consideration. She has no family history of blood conditions or disorders. She is allergic to Allopurinol. She has received her COVID19 vaccines and plans on receiving the booster.   Most recent lab results (01/20/2020) of CBC and CMP is as follows: all values are WNL except for WBC at 12.7K, RBC at 3.02, Hgb at 10.0, HCT at 29.9, MCH at 33.1, PLT  at 1208K, Neutro Abs at 9309, Mono Abs at 1346, Eos Abs at 0, Glucose at 111, Creatinine at 1.15, GFR Est Af Am at 52, Sodium at 129, Chloride at 95. 01/19/2020 Sed Rate at 98  On review of systems, pt reports fingertip numbness and denies vision changes, SOB, leg swelling, fingertip discoloration, unexpected weight loss, fevers, chills, night sweats and any other symptoms.   On PMHx the pt reports Lumbar fusion, Rheumatoid Arthritis, Cervical Stenosis, IBS, PE, Carpal Tunnel. On Social Hx the pt reports that she is a non-smoker, but had significant second-hand exposure previously.   MEDICAL HISTORY:  Past Medical History:  Diagnosis Date  . Arthritis   . Cervical stenosis of spine   . Dysphasia   . Essential hypertension, benign   . GERD (gastroesophageal reflux disease)   . Glaucoma   . Heart murmur   . History of estrogen therapy   . Hypercholesteremia   . IBS (irritable bowel syndrome)   . Mitral regurgitation   . Pulmonary air embolism ([place])    states she had this wiht her back surgery  . Rheumatoid arthritis ([place])   . Seborrhea    patient denies    SURGICAL HISTORY: Past Surgical History:  Procedure Laterality Date  . ANTERIOR AND POSTERIOR REPAIR WITH SACROSPINOUS FIXATION N/A 01/08/2015   Procedure: ANTERIOR AND POSTERIOR REPAIR WITH possible SACROSPINOUS FIXATION;  Surgeon: Molli Posey, MD;  Location: [facility];  Service: Gynecology;  Laterality: N/A;  . BACK SURGERY    . CATARACT  EXTRACTION W/ INTRAOCULAR LENS  IMPLANT, BILATERAL Bilateral   . COLONOSCOPY    . EYE SURGERY    . LAPAROSCOPIC ASSISTED VAGINAL HYSTERECTOMY N/A 01/08/2015   Procedure: LAPAROSCOPIC ASSISTED VAGINAL HYSTERECTOMY;  Surgeon: Molli Posey, MD;  Location: [facility];  Service: Gynecology;  Laterality: N/A;  . LUMBAR FUSION  05/2010  . REVERSE SHOULDER ARTHROPLASTY Left 02/16/2014   Procedure: LEFT REVERSE TOTAL SHOULDER ARTHROPLASTY;  Surgeon: Marin Shutter, MD;  Location: [facility];  Service:  Orthopedics;  Laterality: Left;  . REVERSE SHOULDER ARTHROPLASTY Right 08/07/2016  . REVERSE SHOULDER ARTHROPLASTY Right 08/07/2016   Procedure: REVERSE RIGHT SHOULDER ARTHROPLASTY;  Surgeon: Justice Britain, MD;  Location: [facility];  Service: Orthopedics;  Laterality: Right;  . TOE SURGERY    . TUBAL LIGATION      SOCIAL HISTORY: Social History   Socioeconomic History  . Marital status: Widowed    Spouse name: Not on file  . Number of children: 9  . Years of education: 7  . Highest education level: Not on file  Occupational History  . Not on file  Tobacco Use  . Smoking status: Never Smoker  . Smokeless tobacco: Never Used  Substance and Sexual Activity  . Alcohol use: No  . Drug use: No  . Sexual activity: Never  Other Topics Concern  . Not on file  Social History Narrative   Lives with daughter   Caffeine use: none   Social Determinants of Health   Financial Resource Strain:   . Difficulty of Paying Living Expenses: Not on file  Food Insecurity:   . Worried About Charity fundraiser in the Last Year: Not on file  . Ran Out of Food in the Last Year: Not on file  Transportation Needs:   . Lack of Transportation (Medical): Not on file  . Lack of Transportation (Non-Medical): Not on file  Physical Activity:   . Days of Exercise per Week: Not on file  . Minutes of Exercise per Session: Not on file  Stress:   . Feeling of Stress : Not on file  Social Connections:   . Frequency of Communication with Friends and Family: Not on file  . Frequency of Social Gatherings with Friends and Family: Not on file  . Attends Religious Services: Not on file  . Active Member of Clubs or Organizations: Not on file  . Attends Archivist Meetings: Not on file  . Marital Status: Not on file  Intimate Partner Violence:   . Fear of Current or Ex-Partner: Not on file  . Emotionally Abused: Not on file  . Physically Abused: Not on file  . Sexually Abused: Not on file    FAMILY  HISTORY: Family History  Problem Relation Age of Onset  . Healthy Daughter     ALLERGIES:  is allergic to alendronate, allopurinol, amoxicillin-pot clavulanate, folic acid, methotrexate derivatives, latex, and tramadol.  MEDICATIONS:  Current Outpatient Medications  Medication Sig Dispense Refill  . ALPRAZolam (XANAX) 0.5 MG tablet One half tablet in am and one half tablet at lunch then one whole tablet hs 60 tablet 1  . aspirin 81 MG tablet Take 81 mg by mouth daily.     Marland Kitchen atorvastatin (LIPITOR) 20 MG tablet Take 1 tablet (20 mg total) by mouth daily. 90 tablet 1  . clotrimazole-betamethasone (LOTRISONE) cream Apply 1 application topically 2 (two) times daily. 45 g 2  . esomeprazole (NEXIUM) 40 MG capsule TAKE 1 CAPSULE BY MOUTH  DAILY IN THE  MORNING 90 capsule 1  . gabapentin (NEURONTIN) 300 MG capsule One or 2 capsules po qhs for leg cramps 60 capsule 3  . hydrochlorothiazide (HYDRODIURIL) 25 MG tablet Take 1 tablet (25 mg total) by mouth daily. 90 tablet 3  . Iron-FA-B Cmp-C-Biot-Probiotic (FUSION PLUS) CAPS TAKE 1 CAPSULE BY MOUTH EVERY DAY 30 capsule prn  . lisinopril (ZESTRIL) 5 MG tablet TAKE 1 TABLET BY MOUTH  DAILY 90 tablet 3  . mupirocin ointment (BACTROBAN) 2 % APPLY TO THE AFFECTED AREA TWICE DAILY AS NEEDED 22 g 11  . NEOMYCIN-POLYMYXIN-HYDROCORTISONE (CORTISPORIN) 1 % SOLN OTIC solution Place 3 drops into the left ear 4 (four) times daily. 10 mL 0  . polyethylene glycol powder (GLYCOLAX/MIRALAX) powder MIX 1 CAPFUL IN 8 OUNCES OF WATER AND DRINK ONCE DAILY AS DIRECTED 527 g 0  . terconazole (TERAZOL 3) 0.8 % vaginal cream INSERT 1 APPLICATORFUL VAGINALLY AT BEDTIME 20 g 1  . Triamcinolone Acetonide (TRIAMCINOLONE 0.1 % CREAM : EUCERIN) CREA Apply 1 application topically 2 (two) times daily. 1 each 1  . triamcinolone cream (KENALOG) 0.1 % Apply 1 application topically 2 (two) times daily. 30 g 3   No current facility-administered medications for this visit.    REVIEW OF  SYSTEMS:    10 Point review of Systems was done is negative except as noted above.  PHYSICAL EXAMINATION: ECOG PERFORMANCE STATUS: 1 - Symptomatic but completely ambulatory  . Vitals:   01/31/20 1141  BP: (!) 154/69  Pulse: 77  Resp: 18  Temp: (!) 97.4 F (36.3 C)  SpO2: 99%   Filed Weights   01/31/20 1141  Weight: 146 lb 11.2 oz (66.5 kg)   .Body mass index is 29.13 kg/m.  Exam was given in a chair   GENERAL:alert, in no acute distress and comfortable SKIN: no acute rashes, no significant lesions EYES: conjunctiva are pink and non-injected, sclera anicteric OROPHARYNX: MMM, no exudates, no oropharyngeal erythema or ulceration NECK: supple, no JVD LYMPH:  no palpable lymphadenopathy in the cervical, axillary or inguinal regions LUNGS: clear to auscultation b/l with normal respiratory effort HEART: regular rate & rhythm ABDOMEN:  normoactive bowel sounds , non tender, not distended. Extremity: no pedal edema PSYCH: alert & oriented x 3 with fluent speech NEURO: no focal motor/sensory deficits  LABORATORY DATA:  I have reviewed the data as listed  . CBC Latest Ref Rng & Units 01/31/2020 01/20/2020 06/16/2019  WBC 4.0 - 10.5 K/uL 11.8(H) 12.7(H) 7.0  Hemoglobin 12.0 - 15.0 g/dL 9.4(L) 10.0(L) 10.4(L)  Hematocrit 36 - 46 % 27.8(L) 29.9(L) 31.0(L)  Platelets 150 - 400 K/uL 814(H) 1,208(H) 829(H)   . CBC    Component Value Date/Time   WBC 11.8 (H) 01/31/2020 1224   RBC 2.92 (L) 01/31/2020 1225   RBC 2.92 (L) 01/31/2020 1224   HGB 9.4 (L) 01/31/2020 1224   HGB 10.4 (L) 10/01/2015 1558   HCT 27.8 (L) 01/31/2020 1224   HCT 31.0 (L) 10/01/2015 1558   PLT 814 (H) 01/31/2020 1224   PLT 482 (H) 10/01/2015 1558   MCV 95.2 01/31/2020 1224   MCV 96.3 10/01/2015 1558   MCH 32.2 01/31/2020 1224   MCHC 33.8 01/31/2020 1224   RDW 15.3 01/31/2020 1224   RDW 15.6 (H) 10/01/2015 1558   LYMPHSABS 2.0 01/31/2020 1224   LYMPHSABS 2.5 10/01/2015 1558   MONOABS 1.0 01/31/2020  1224   MONOABS 0.7 10/01/2015 1558   EOSABS 0.2 01/31/2020 1224   EOSABS 0.2 10/01/2015 1558  BASOSABS 0.1 01/31/2020 1224   BASOSABS 0.0 10/01/2015 1558    . CMP Latest Ref Rng & Units 01/31/2020 01/20/2020 12/09/2019  Glucose 70 - 99 mg/dL 85 111(H) -  BUN 8 - 23 mg/dL 19 23 -  Creatinine 0.44 - 1.00 mg/dL 0.96 1.15(H) -  Sodium 135 - 145 mmol/L 126(L) 129(L) -  Potassium 3.5 - 5.1 mmol/L 3.4(L) 4.9 -  Chloride 98 - 111 mmol/L 91(L) 95(L) -  CO2 22 - 32 mmol/L 25 25 -  Calcium 8.9 - 10.3 mg/dL 9.3 9.7 -  Total Protein 6.5 - 8.1 g/dL 7.5 7.0 6.6  Total Bilirubin 0.3 - 1.2 mg/dL 0.5 0.3 0.3  Alkaline Phos 38 - 126 U/L 78 - -  AST 15 - 41 U/L 23 26 30   ALT 0 - 44 U/L 18 20 18      RADIOGRAPHIC STUDIES: I have personally reviewed the radiological images as listed and agreed with the findings in the report. No results found.  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 -Discussed 01/19/2020 Sed Rate at 98 -Advised pt that thrombocytosis could be caused by a reactive process or a primary bone marrow disorder.  -Would not r/o the possibility of a bone marrow problem at this time because of progressively increasing PLT.  -Advised pt that thrombocytosis can increase the risk of blood clots, heart attacks, and strokes. This risk is even higher if thrombocytosis is caused by a bone marrow disorder. -Advised pt that if she has Essential thrombocytosis we would control with medication.  -Advised pt that if thrombocytosis is reactive we would  treat by addressing the underlying condition - her RA.  -Advised pt that her anemia is caused, in part, by RA and kidney dysfunction.  -Discussed CDC guidelines regarding COVID19 booster. Plan to give in clinic today.  -Will get labs today -Will see back in 2 weeks via phone   FOLLOW UP: Labs today Phone visit with Dr Irene Limbo in 2 weeks  . Orders Placed This Encounter  Procedures  . CBC with Differential/Platelet    Standing Status:   Future    Number of Occurrences:   1    Standing Expiration Date:   01/30/2021  . CMP ([place] only)    Standing Status:   Future    Number of Occurrences:   1    Standing Expiration Date:   01/30/2021  . Lactate dehydrogenase    Standing Status:   Future    Number of Occurrences:   1    Standing Expiration Date:   01/30/2021  . JAK2 (including V617F and Exon 12), MPL, and CALR-Next Generation Sequencing    Standing Status:   Future    Number of Occurrences:   1    Standing Expiration Date:   01/30/2021  . Haptoglobin    Standing Status:   Future    Number of Occurrences:   1    Standing Expiration Date:   01/30/2021  . Ferritin  Standing Status:   Future    Number of Occurrences:   1    Standing Expiration Date:   01/30/2021  . Iron and TIBC    Standing Status:   Future    Number of Occurrences:   1    Standing Expiration Date:   01/30/2021  . Vitamin B12    Standing Status:   Future    Number of Occurrences:   1    Standing Expiration Date:   01/30/2021  . Sedimentation rate    Standing Status:   Future    Number of Occurrences:   1    Standing Expiration Date:   01/30/2021  . C-reactive protein    Standing Status:   Future    Number of Occurrences:   1    Standing Expiration Date:   01/30/2021  . Reticulocytes    Standing Status:   Future    Number of Occurrences:   1    Standing Expiration Date:   01/30/2021     All of the patients questions were answered with apparent satisfaction. The patient knows to call the clinic with any problems,  questions or concerns.  I spent 35 mins counseling the patient face to face. The total time spent in the appointment was 50 minutes and more than 50% was on counseling and direct patient cares.    Sullivan Lone MD [place] AAHIVMS [facility] Hematology/Oncology Physician [facility]  (Office):       [phone] (Work cell):  [phone] (Fax):           [phone]  02/01/2020 8:44 PM  I, Yevette Edwards, am acting as a scribe for Dr. Sullivan Lone.   .I have reviewed the above documentation for accuracy and completeness, and I agree with the above. Brunetta Genera MD

## 2020-02-01 ENCOUNTER — Ambulatory Visit: Payer: Medicare Other | Admitting: Internal Medicine

## 2020-02-01 DIAGNOSIS — Z0289 Encounter for other administrative examinations: Secondary | ICD-10-CM

## 2020-02-01 LAB — HAPTOGLOBIN: Haptoglobin: 326 mg/dL (ref 41–333)

## 2020-02-06 ENCOUNTER — Other Ambulatory Visit: Payer: Self-pay | Admitting: Oncology

## 2020-02-06 ENCOUNTER — Encounter: Payer: Medicare Other | Admitting: Hematology

## 2020-02-06 LAB — JAK2 (INCLUDING V617F AND EXON 12), MPL,& CALR-NEXT GEN SEQ

## 2020-02-08 ENCOUNTER — Other Ambulatory Visit: Payer: Self-pay | Admitting: Internal Medicine

## 2020-02-13 ENCOUNTER — Telehealth: Payer: Self-pay | Admitting: Hematology

## 2020-02-13 NOTE — Telephone Encounter (Signed)
Contacted patient to verify phone visit for pre reg °

## 2020-02-14 ENCOUNTER — Inpatient Hospital Stay (HOSPITAL_BASED_OUTPATIENT_CLINIC_OR_DEPARTMENT_OTHER): Payer: Medicare Other | Admitting: Hematology

## 2020-02-14 DIAGNOSIS — D509 Iron deficiency anemia, unspecified: Secondary | ICD-10-CM | POA: Diagnosis not present

## 2020-02-14 DIAGNOSIS — D473 Essential (hemorrhagic) thrombocythemia: Secondary | ICD-10-CM

## 2020-02-14 MED ORDER — HYDROXYUREA 500 MG PO CAPS: 500.0000 mg | ORAL_CAPSULE | ORAL | 1 refills | Status: DC

## 2020-02-14 NOTE — Progress Notes (Signed)
HEMATOLOGY/ONCOLOGY CONSULTATION NOTE  Date of Service: 02/14/2020  Patient Care Team: [name], MD as PCP - [name], MD as Consulting Physician (Gastroenterology) Valinda Party, MD (Rheumatology)  CHIEF COMPLAINTS/PURPOSE OF CONSULTATION:  Thrombocytosis & Anemia  HISTORY OF PRESENTING ILLNESS:   [name] is a wonderful 79 y.o. female who has been referred to [place] by Dr. Renold Genta for evaluation and management of thrombocytosis and anemia. Pt is accompanied today by her daughter. The pt reports that she is doing well overall.   The pt reports that she was diagnosed with Rheumatoid Arthritis three years ago. Pt is currently being seen by Dr. Dossie Der for her RA. Pt was using IV medication for a year, likely Methotrexate, to treat her RA before she began having an allergic reaction and was stopped. She was on Prednisone to treat for two weeks and was stopped a week ago due to rash, which as resolved. She also receives Cortizone knee injections periodically, which help for a short time. Pt reports that her joint stiffness improves within 30 minutes of waking up and beginning to move about. Pt has a follow up scheduled with Dr. Dossie Der in the beginning of December.  Pt had pulmonary embolism in 2012 that was thought to be caused by back surgery. Pt was treated with Coumadin for 6 months following her PE. She denies any heart attacks or strokes. Pt is currently taking three 81 mg Aspirin daily. Pt has IBS and complains of occasional abdominal discomfort. Pt has carpal tunnel and has received local injections, but surgery is not a current consideration. She has no family history of blood conditions or disorders. She is allergic to Allopurinol. She has received her COVID19 vaccines and plans on receiving the booster.   Most recent lab results (01/20/2020) of CBC and CMP is as follows: all values are WNL except for WBC at 12.7K, RBC at 3.02, Hgb at 10.0, HCT at 29.9, MCH at 33.1, PLT  at 1208K, Neutro Abs at 9309, Mono Abs at 1346, Eos Abs at 0, Glucose at 111, Creatinine at 1.15, GFR Est Af Am at 52, Sodium at 129, Chloride at 95. 01/19/2020 Sed Rate at 98  On review of systems, pt reports fingertip numbness and denies vision changes, SOB, leg swelling, fingertip discoloration, unexpected weight loss, fevers, chills, night sweats and any other symptoms.   On PMHx the pt reports Lumbar fusion, Rheumatoid Arthritis, Cervical Stenosis, IBS, PE, Carpal Tunnel. On Social Hx the pt reports that she is a non-smoker, but had significant second-hand exposure previously.   INTERVAL HISTORY: I connected with  [name] on 02/14/20 by telephone and verified that I am speaking with the correct person using two identifiers.   I discussed the limitations of evaluation and management by telemedicine. The patient expressed understanding and agreed to proceed.  Other persons participating in the visit and their role in the encounter:        -Yevette Edwards, Medical Scribe       -Pt's daughter Patient's location: Home Provider's location: [place] [facility] [place] is a wonderful 79 y.o. female who is here for evaluation and management of thrombocytosis and anemia. The patient's last visit with [place] was on 01/31/2020. The pt reports that she is doing well overall.  The pt reports that she was taken off of Methotrexate due to an allergic reaction. She has also discontinued Prednisone, which was used to control her RA. Pt is currently taking  three Aspirin per day. She denies any bleeding or abnormal bruising issues. She is taking PO Iron, which causes constipation.   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  Lab results (01/31/20) of CBC w/diff and CMP is as follows: all values are WNL except for WBC at 11.8K, RBC at 2.92, Hgb at 9.4,  HCT at 27.8, PLT at 814K, nRBC at 0.3, Neutro Abs at 8.4K, Sodium at 126, Potassium at 3.4, Chloride at 91. 01/31/2020 CRP at 4.0 01/31/2020 LDH at 320 01/31/2020 Ferritin at 232 01/31/2020 Sed Rate at 72 01/31/2020 Haptoglobin at 326 01/31/2020 Vitamin B12 at 787 01/31/2020 Retic Ct Pct at 1.8, Retic Ct Abs at 51.1, Immature Retic Fract at 24.3 01/31/2020 Iron Panel is as follows: Iron at 38, TIBC at 318, Sat Ratios at 12, UIBC at 279  On review of systems, pt reports dizziness, constipation and denies abnormal bruising/bleeding and any other symptoms.   MEDICAL HISTORY:  Past Medical History:  Diagnosis Date  . Arthritis   . Cervical stenosis of spine   . Dysphasia   . Essential hypertension, benign   . GERD (gastroesophageal reflux disease)   . Glaucoma   . Heart murmur   . History of estrogen therapy   . Hypercholesteremia   . IBS (irritable bowel syndrome)   . Mitral regurgitation   . Pulmonary air embolism ([place])    states she had this wiht her back surgery  . Rheumatoid arthritis ([place])   . Seborrhea    patient denies    SURGICAL HISTORY: Past Surgical History:  Procedure Laterality Date  . ANTERIOR AND POSTERIOR REPAIR WITH SACROSPINOUS FIXATION N/A 01/08/2015   Procedure: ANTERIOR AND POSTERIOR REPAIR WITH possible SACROSPINOUS FIXATION;  Surgeon: Molli Posey, MD;  Location: [facility];  Service: Gynecology;  Laterality: N/A;  . BACK SURGERY    . CATARACT EXTRACTION W/ INTRAOCULAR LENS  IMPLANT, BILATERAL Bilateral   . COLONOSCOPY    . EYE SURGERY    . LAPAROSCOPIC ASSISTED VAGINAL HYSTERECTOMY N/A 01/08/2015   Procedure: LAPAROSCOPIC ASSISTED VAGINAL HYSTERECTOMY;  Surgeon: Molli Posey, MD;  Location: [facility];  Service: Gynecology;  Laterality: N/A;  . LUMBAR FUSION  05/2010  . REVERSE SHOULDER ARTHROPLASTY Left 02/16/2014   Procedure: LEFT REVERSE TOTAL SHOULDER ARTHROPLASTY;  Surgeon: Marin Shutter, MD;  Location: [facility];  Service: Orthopedics;  Laterality:  Left;  . REVERSE SHOULDER ARTHROPLASTY Right 08/07/2016  . REVERSE SHOULDER ARTHROPLASTY Right 08/07/2016   Procedure: REVERSE RIGHT SHOULDER ARTHROPLASTY;  Surgeon: Justice Britain, MD;  Location: [facility];  Service: Orthopedics;  Laterality: Right;  . TOE SURGERY    . TUBAL LIGATION      SOCIAL HISTORY: Social History   Socioeconomic History  . Marital status: Widowed    Spouse name: Not on file  . Number of children: 9  . Years of education: 7  . Highest education level: Not on file  Occupational History  . Not on file  Tobacco Use  . Smoking status: Never Smoker  . Smokeless tobacco: Never Used  Substance and Sexual Activity  . Alcohol use: No  . Drug use: No  . Sexual activity: Never  Other Topics Concern  . Not on file  Social History Narrative   Lives with daughter   Caffeine use: none   Social Determinants of Health   Financial Resource Strain:   . Difficulty of  Paying Living Expenses: Not on file  Food Insecurity:   . Worried About Charity fundraiser in the Last Year: Not on file  . Ran Out of Food in the Last Year: Not on file  Transportation Needs:   . Lack of Transportation (Medical): Not on file  . Lack of Transportation (Non-Medical): Not on file  Physical Activity:   . Days of Exercise per Week: Not on file  . Minutes of Exercise per Session: Not on file  Stress:   . Feeling of Stress : Not on file  Social Connections:   . Frequency of Communication with Friends and Family: Not on file  . Frequency of Social Gatherings with Friends and Family: Not on file  . Attends Religious Services: Not on file  . Active Member of Clubs or Organizations: Not on file  . Attends Archivist Meetings: Not on file  . Marital Status: Not on file  Intimate Partner Violence:   . Fear of Current or Ex-Partner: Not on file  . Emotionally Abused: Not on file  . Physically Abused: Not on file  . Sexually Abused: Not on file    FAMILY HISTORY: Family History    Problem Relation Age of Onset  . Healthy Daughter     ALLERGIES:  is allergic to alendronate, allopurinol, amoxicillin-pot clavulanate, folic acid, methotrexate derivatives, latex, and tramadol.  MEDICATIONS:  Current Outpatient Medications  Medication Sig Dispense Refill  . ALPRAZolam (XANAX) 0.5 MG tablet One half tablet in am and one half tablet at lunch then one whole tablet hs 60 tablet 1  . aspirin 81 MG tablet Take 81 mg by mouth daily.     Marland Kitchen atorvastatin (LIPITOR) 20 MG tablet Take 1 tablet (20 mg total) by mouth daily. 90 tablet 1  . clotrimazole-betamethasone (LOTRISONE) cream Apply 1 application topically 2 (two) times daily. 45 g 2  . esomeprazole (NEXIUM) 40 MG capsule TAKE 1 CAPSULE BY MOUTH  DAILY IN THE MORNING 90 capsule 1  . gabapentin (NEURONTIN) 300 MG capsule One or 2 capsules po qhs for leg cramps 60 capsule 3  . hydrochlorothiazide (HYDRODIURIL) 25 MG tablet Take 1 tablet (25 mg total) by mouth daily. 90 tablet 3  . [START ON 02/15/2020] hydroxyurea (HYDREA) 500 MG capsule Take 1 capsule (500 mg total) by mouth 3 (three) times a week. May take with food to minimize GI side effects. 30 capsule 1  . lisinopril (ZESTRIL) 5 MG tablet TAKE 1 TABLET BY MOUTH  DAILY 90 tablet 3  . mupirocin ointment (BACTROBAN) 2 % APPLY TO THE AFFECTED AREA TWICE DAILY AS NEEDED 22 g 11  . NEOMYCIN-POLYMYXIN-HYDROCORTISONE (CORTISPORIN) 1 % SOLN OTIC solution Place 3 drops into the left ear 4 (four) times daily. 10 mL 0  . polyethylene glycol powder (GLYCOLAX/MIRALAX) powder MIX 1 CAPFUL IN 8 OUNCES OF WATER AND DRINK ONCE DAILY AS DIRECTED 527 g 0  . terconazole (TERAZOL 3) 0.8 % vaginal cream INSERT 1 APPLICATORFUL VAGINALLY AT BEDTIME 20 g 1  . Triamcinolone Acetonide (TRIAMCINOLONE 0.1 % CREAM : EUCERIN) CREA Apply 1 application topically 2 (two) times daily. 1 each 1  . triamcinolone cream (KENALOG) 0.1 % Apply 1 application topically 2 (two) times daily. 30 g 3   No current  facility-administered medications for this visit.    REVIEW OF SYSTEMS:   A 10+ POINT REVIEW OF SYSTEMS WAS OBTAINED including neurology, dermatology, psychiatry, cardiac, respiratory, lymph, extremities, GI, GU, Musculoskeletal, constitutional, breasts, reproductive, HEENT.  All  pertinent positives are noted in the HPI.  All others are negative.   PHYSICAL EXAMINATION: ECOG PERFORMANCE STATUS: 1 - Symptomatic but completely ambulatory  .Telehealth visit  LABORATORY DATA:  I have reviewed the data as listed  . CBC Latest Ref Rng & Units 01/31/2020 01/20/2020 06/16/2019  WBC 4.0 - 10.5 K/uL 11.8(H) 12.7(H) 7.0  Hemoglobin 12.0 - 15.0 g/dL 9.4(L) 10.0(L) 10.4(L)  Hematocrit 36 - 46 % 27.8(L) 29.9(L) 31.0(L)  Platelets 150 - 400 K/uL 814(H) 1,208(H) 829(H)   . CBC    Component Value Date/Time   WBC 11.8 (H) 01/31/2020 1224   RBC 2.92 (L) 01/31/2020 1225   RBC 2.92 (L) 01/31/2020 1224   HGB 9.4 (L) 01/31/2020 1224   HGB 10.4 (L) 10/01/2015 1558   HCT 27.8 (L) 01/31/2020 1224   HCT 31.0 (L) 10/01/2015 1558   PLT 814 (H) 01/31/2020 1224   PLT 482 (H) 10/01/2015 1558   MCV 95.2 01/31/2020 1224   MCV 96.3 10/01/2015 1558   MCH 32.2 01/31/2020 1224   MCHC 33.8 01/31/2020 1224   RDW 15.3 01/31/2020 1224   RDW 15.6 (H) 10/01/2015 1558   LYMPHSABS 2.0 01/31/2020 1224   LYMPHSABS 2.5 10/01/2015 1558   MONOABS 1.0 01/31/2020 1224   MONOABS 0.7 10/01/2015 1558   EOSABS 0.2 01/31/2020 1224   EOSABS 0.2 10/01/2015 1558   BASOSABS 0.1 01/31/2020 1224   BASOSABS 0.0 10/01/2015 1558    . CMP Latest Ref Rng & Units 01/31/2020 01/20/2020 12/09/2019  Glucose 70 - 99 mg/dL 85 111(H) -  BUN 8 - 23 mg/dL 19 23 -  Creatinine 0.44 - 1.00 mg/dL 0.96 1.15(H) -  Sodium 135 - 145 mmol/L 126(L) 129(L) -  Potassium 3.5 - 5.1 mmol/L 3.4(L) 4.9 -  Chloride 98 - 111 mmol/L 91(L) 95(L) -  CO2 22 - 32 mmol/L 25 25 -  Calcium 8.9 - 10.3 mg/dL 9.3 9.7 -  Total Protein 6.5 - 8.1 g/dL 7.5 7.0 6.6  Total  Bilirubin 0.3 - 1.2 mg/dL 0.5 0.3 0.3  Alkaline Phos 38 - 126 U/L 78 - -  AST 15 - 41 U/L 23 26 30   ALT 0 - 44 U/L 18 20 18      RADIOGRAPHIC STUDIES: I have personally reviewed the radiological images as listed and agreed with the findings in the report. No results found.  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 -Discussed 01/31/2020 CRP at 4.0, LDH at 320, Ferritin at 232, Sed Rate at 72, Haptoglobin at 326, Vitamin B12 at 787, Retic Ct Pct at 1.8, Retic Ct Abs at 51.1, Immature Retic Fract at 24.3, Iron Panel is as follows: Iron at 38, TIBC at 318, Sat Ratios at 12, UIBC at 279.  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 -Advised pt that elevated PLT can increase the risk of blood clots up to 20-30% per year. Would aim for PLT <400K.  -Advised pt that we would use Hydroxyurea to slow down PLT production. Will have to balance elevated PLT & WBC with anemia.  -Advised pt that her anemia is caused by inflammation from poorly-controlled RA and iron deficiency.  -Advised pt to hold HCTZ as it  is causing dehydration and progressive hyponatremia. Recommend pt f/u with Dr. Renold Genta for management. -Recommend pt f/u with Rheumatologist closely for RA management. -Will begin 500 mg Hydroxyurea three times per week.  -Advised pt that she can discontinue po Iron -Continue Aspirin, B-complex -Will give IV Injectafer weekly x2  -Will see back in 3 weeks with labs   FOLLOW UP: IV Injectafer weekly x 2 doses ASAP RTC with Dr Irene Limbo with labs in 3 weeks   The total time spent in the appt was 30 minutes  and more than 50% was on counseling and direct patient cares and co-ordination of treatment plan.  All of the patient's questions were answered with apparent satisfaction. The patient knows to call the clinic with any problems, questions or concerns.    Sullivan Lone MD [place] A[facility]IVMS [facility] Hematology/Oncology Physician [facility]  (Office):       [phone] (Work cell):  [phone] (Fax):           [phone]  02/14/2020 3:03 PM  I, Yevette Edwards, am acting as a scribe for Dr. Sullivan Lone.   .I have reviewed the above documentation for accuracy and completeness, and I agree with the above. Brunetta Genera MD

## 2020-02-17 ENCOUNTER — Telehealth: Payer: Self-pay | Admitting: Hematology

## 2020-02-17 DIAGNOSIS — D509 Iron deficiency anemia, unspecified: Secondary | ICD-10-CM | POA: Insufficient documentation

## 2020-02-17 NOTE — Telephone Encounter (Signed)
Scheduled per 09/22 los, patient has been called and notified. 

## 2020-02-20 NOTE — Addendum Note (Signed)
Addended by: Mady Haagensen on: 02/20/2020 10:46 AM   Modules accepted: Orders

## 2020-02-21 ENCOUNTER — Other Ambulatory Visit: Payer: Self-pay

## 2020-02-21 ENCOUNTER — Inpatient Hospital Stay: Payer: Medicare Other

## 2020-02-21 VITALS — BP 147/79 | HR 66 | Temp 98.2°F | Resp 16

## 2020-02-21 DIAGNOSIS — D72829 Elevated white blood cell count, unspecified: Secondary | ICD-10-CM | POA: Diagnosis not present

## 2020-02-21 DIAGNOSIS — M069 Rheumatoid arthritis, unspecified: Secondary | ICD-10-CM | POA: Diagnosis not present

## 2020-02-21 DIAGNOSIS — N183 Chronic kidney disease, stage 3 unspecified: Secondary | ICD-10-CM

## 2020-02-21 DIAGNOSIS — M4802 Spinal stenosis, cervical region: Secondary | ICD-10-CM | POA: Diagnosis not present

## 2020-02-21 DIAGNOSIS — N289 Disorder of kidney and ureter, unspecified: Secondary | ICD-10-CM | POA: Diagnosis not present

## 2020-02-21 DIAGNOSIS — D509 Iron deficiency anemia, unspecified: Secondary | ICD-10-CM

## 2020-02-21 DIAGNOSIS — R200 Anesthesia of skin: Secondary | ICD-10-CM | POA: Diagnosis not present

## 2020-02-21 DIAGNOSIS — D649 Anemia, unspecified: Secondary | ICD-10-CM | POA: Diagnosis not present

## 2020-02-21 DIAGNOSIS — R7989 Other specified abnormal findings of blood chemistry: Secondary | ICD-10-CM | POA: Diagnosis not present

## 2020-02-21 DIAGNOSIS — Z86711 Personal history of pulmonary embolism: Secondary | ICD-10-CM | POA: Diagnosis not present

## 2020-02-21 DIAGNOSIS — Z7982 Long term (current) use of aspirin: Secondary | ICD-10-CM | POA: Diagnosis not present

## 2020-02-21 DIAGNOSIS — K589 Irritable bowel syndrome without diarrhea: Secondary | ICD-10-CM | POA: Diagnosis not present

## 2020-02-21 MED ORDER — ACETAMINOPHEN 325 MG PO TABS
ORAL_TABLET | ORAL | Status: AC
  Filled 2020-02-21: qty 2

## 2020-02-21 MED ORDER — SODIUM CHLORIDE 0.9 % IV SOLN
750.0000 mg | Freq: Once | INTRAVENOUS | Status: AC
  Administered 2020-02-21: 750 mg via INTRAVENOUS
  Filled 2020-02-21: qty 15

## 2020-02-21 MED ORDER — ACETAMINOPHEN 325 MG PO TABS
650.0000 mg | ORAL_TABLET | Freq: Once | ORAL | Status: AC
  Administered 2020-02-21: 650 mg via ORAL

## 2020-02-21 MED ORDER — LORATADINE 10 MG PO TABS
10.0000 mg | ORAL_TABLET | Freq: Once | ORAL | Status: AC
  Administered 2020-02-21: 10 mg via ORAL

## 2020-02-21 MED ORDER — SODIUM CHLORIDE 0.9 % IV SOLN
Freq: Once | INTRAVENOUS | Status: AC
  Filled 2020-02-21: qty 250

## 2020-02-21 MED ORDER — LORATADINE 10 MG PO TABS
ORAL_TABLET | ORAL | Status: AC
  Filled 2020-02-21: qty 1

## 2020-02-21 NOTE — Patient Instructions (Signed)

## 2020-02-28 ENCOUNTER — Inpatient Hospital Stay: Payer: Medicare Other | Attending: Hematology

## 2020-02-28 ENCOUNTER — Other Ambulatory Visit: Payer: Self-pay

## 2020-02-28 VITALS — BP 147/68 | HR 61 | Temp 97.8°F | Resp 17

## 2020-02-28 DIAGNOSIS — D649 Anemia, unspecified: Secondary | ICD-10-CM | POA: Diagnosis not present

## 2020-02-28 DIAGNOSIS — D509 Iron deficiency anemia, unspecified: Secondary | ICD-10-CM

## 2020-02-28 DIAGNOSIS — Z86711 Personal history of pulmonary embolism: Secondary | ICD-10-CM | POA: Insufficient documentation

## 2020-02-28 DIAGNOSIS — N183 Chronic kidney disease, stage 3 unspecified: Secondary | ICD-10-CM

## 2020-02-28 DIAGNOSIS — D473 Essential (hemorrhagic) thrombocythemia: Secondary | ICD-10-CM | POA: Diagnosis not present

## 2020-02-28 DIAGNOSIS — Z7982 Long term (current) use of aspirin: Secondary | ICD-10-CM | POA: Diagnosis not present

## 2020-02-28 DIAGNOSIS — D72829 Elevated white blood cell count, unspecified: Secondary | ICD-10-CM | POA: Diagnosis not present

## 2020-02-28 MED ORDER — SODIUM CHLORIDE 0.9 % IV SOLN
Freq: Once | INTRAVENOUS | Status: AC
  Filled 2020-02-28: qty 250

## 2020-02-28 MED ORDER — ACETAMINOPHEN 325 MG PO TABS
650.0000 mg | ORAL_TABLET | Freq: Once | ORAL | Status: AC
  Administered 2020-02-28: 650 mg via ORAL

## 2020-02-28 MED ORDER — SODIUM CHLORIDE 0.9 % IV SOLN
750.0000 mg | Freq: Once | INTRAVENOUS | Status: AC
  Administered 2020-02-28: 750 mg via INTRAVENOUS
  Filled 2020-02-28: qty 15

## 2020-02-28 MED ORDER — LORATADINE 10 MG PO TABS
ORAL_TABLET | ORAL | Status: AC
  Filled 2020-02-28: qty 1

## 2020-02-28 MED ORDER — LORATADINE 10 MG PO TABS
10.0000 mg | ORAL_TABLET | Freq: Once | ORAL | Status: AC
  Administered 2020-02-28: 10 mg via ORAL

## 2020-02-28 MED ORDER — ACETAMINOPHEN 325 MG PO TABS
ORAL_TABLET | ORAL | Status: AC
  Filled 2020-02-28: qty 2

## 2020-02-28 NOTE — Patient Instructions (Signed)

## 2020-03-05 NOTE — Progress Notes (Signed)
HEMATOLOGY/ONCOLOGY CLINIC NOTE  Date of Service: 03/06/2020  Patient Care Team: [name], MD as PCP - [name], MD as Consulting Physician (Gastroenterology) Valinda Party, MD (Rheumatology)  CHIEF COMPLAINTS/PURPOSE OF CONSULTATION:  Thrombocytosis & Anemia  HISTORY OF PRESENTING ILLNESS:   [name] is a wonderful 79 y.o. female who has been referred to [place] by Dr. Renold Genta for evaluation and management of thrombocytosis and anemia. Pt is accompanied today by her daughter. The pt reports that she is doing well overall.   The pt reports that she was diagnosed with Rheumatoid Arthritis three years ago. Pt is currently being seen by Dr. Dossie Der for her RA. Pt was using IV medication for a year, likely Methotrexate, to treat her RA before she began having an allergic reaction and was stopped. She was on Prednisone to treat for two weeks and was stopped a week ago due to rash, which as resolved. She also receives Cortizone knee injections periodically, which help for a short time. Pt reports that her joint stiffness improves within 30 minutes of waking up and beginning to move about. Pt has a follow up scheduled with Dr. Dossie Der in the beginning of December.  Pt had pulmonary embolism in 2012 that was thought to be caused by back surgery. Pt was treated with Coumadin for 6 months following her PE. She denies any heart attacks or strokes. Pt is currently taking three 81 mg Aspirin daily. Pt has IBS and complains of occasional abdominal discomfort. Pt has carpal tunnel and has received local injections, but surgery is not a current consideration. She has no family history of blood conditions or disorders. She is allergic to Allopurinol. She has received her COVID19 vaccines and plans on receiving the booster.   Most recent lab results (01/20/2020) of CBC and CMP is as follows: all values are WNL except for WBC at 12.7K, RBC at 3.02, Hgb at 10.0, HCT at 29.9, MCH at 33.1, PLT at  1208K, Neutro Abs at 9309, Mono Abs at 1346, Eos Abs at 0, Glucose at 111, Creatinine at 1.15, GFR Est Af Am at 52, Sodium at 129, Chloride at 95. 01/19/2020 Sed Rate at 98  On review of systems, pt reports fingertip numbness and denies vision changes, SOB, leg swelling, fingertip discoloration, unexpected weight loss, fevers, chills, night sweats and any other symptoms.   On PMHx the pt reports Lumbar fusion, Rheumatoid Arthritis, Cervical Stenosis, IBS, PE, Carpal Tunnel. On Social Hx the pt reports that she is a non-smoker, but had significant second-hand exposure previously.   INTERVAL HISTORY: NAIYANA West is a wonderful 79 y.o. female who is here for evaluation and management of thrombocytosis and anemia. The patient's last visit with [place] was on 02/14/2020. The pt reports that she is doing well overall.  The pt reports that she tolerated IV Iron well and has noticed some improvement in energy. Pt has been taking Hydroxyurea Wednesday, Thursday, and Friday of every week. She is not currently using any medication to control her RA. Pt had negative reactions from several RA treatments in the past. She denies much joint pain, but does have some finger stiffness and soreness. She continues taking three Aspirin and a B-complex vitamin daily.   Lab results today (03/06/20) of CBC w/diff and CMP is as follows: all values are WNL except for RBC at 3.05, Hgb at 10.1, HCT at 31.4, MCV at 103.0, RDW at 18.5, PLT at 1004K, nRBC at 1.3, Mono Abs at  1.1K, Glucose at 54, Creatinine at 1.10, Calcium at 8.8, GFR Est at 47.  On review of systems, pt reports improving fatigue, joint stiffness/soreness, eating well, occasional constipation and denies leg swelling, SOB, chest pain, abdominal pain and any other symptoms.   MEDICAL HISTORY:  Past Medical History:  Diagnosis Date  . Arthritis   . Cervical stenosis of spine   . Dysphasia   . Essential hypertension, benign   . GERD (gastroesophageal reflux  disease)   . Glaucoma   . Heart murmur   . History of estrogen therapy   . Hypercholesteremia   . IBS (irritable bowel syndrome)   . Mitral regurgitation   . Pulmonary air embolism ([place])    states she had this wiht her back surgery  . Rheumatoid arthritis ([place])   . Seborrhea    patient denies    SURGICAL HISTORY: Past Surgical History:  Procedure Laterality Date  . ANTERIOR AND POSTERIOR REPAIR WITH SACROSPINOUS FIXATION N/A 01/08/2015   Procedure: ANTERIOR AND POSTERIOR REPAIR WITH possible SACROSPINOUS FIXATION;  Surgeon: Molli Posey, MD;  Location: [facility];  Service: Gynecology;  Laterality: N/A;  . BACK SURGERY    . CATARACT EXTRACTION W/ INTRAOCULAR LENS  IMPLANT, BILATERAL Bilateral   . COLONOSCOPY    . EYE SURGERY    . LAPAROSCOPIC ASSISTED VAGINAL HYSTERECTOMY N/A 01/08/2015   Procedure: LAPAROSCOPIC ASSISTED VAGINAL HYSTERECTOMY;  Surgeon: Molli Posey, MD;  Location: [facility];  Service: Gynecology;  Laterality: N/A;  . LUMBAR FUSION  05/2010  . REVERSE SHOULDER ARTHROPLASTY Left 02/16/2014   Procedure: LEFT REVERSE TOTAL SHOULDER ARTHROPLASTY;  Surgeon: Marin Shutter, MD;  Location: [facility];  Service: Orthopedics;  Laterality: Left;  . REVERSE SHOULDER ARTHROPLASTY Right 08/07/2016  . REVERSE SHOULDER ARTHROPLASTY Right 08/07/2016   Procedure: REVERSE RIGHT SHOULDER ARTHROPLASTY;  Surgeon: Justice Britain, MD;  Location: [facility];  Service: Orthopedics;  Laterality: Right;  . TOE SURGERY    . TUBAL LIGATION      SOCIAL HISTORY: Social History   Socioeconomic History  . Marital status: Widowed    Spouse name: Not on file  . Number of children: 9  . Years of education: 7  . Highest education level: Not on file  Occupational History  . Not on file  Tobacco Use  . Smoking status: Never Smoker  . Smokeless tobacco: Never Used  Substance and Sexual Activity  . Alcohol use: No  . Drug use: No  . Sexual activity: Never  Other Topics Concern  . Not on file  Social  History Narrative   Lives with daughter   Caffeine use: none   Social Determinants of Health   Financial Resource Strain:   . Difficulty of Paying Living Expenses: Not on file  Food Insecurity:   . Worried About Charity fundraiser in the Last Year: Not on file  . Ran Out of Food in the Last Year: Not on file  Transportation Needs:   . Lack of Transportation (Medical): Not on file  . Lack of Transportation (Non-Medical): Not on file  Physical Activity:   . Days of Exercise per Week: Not on file  . Minutes of Exercise per Session: Not on file  Stress:   . Feeling of Stress : Not on file  Social Connections:   . Frequency of Communication with Friends and Family: Not on file  . Frequency of Social Gatherings with Friends and Family: Not on file  . Attends Religious Services: Not on file  .  Active Member of Clubs or Organizations: Not on file  . Attends Archivist Meetings: Not on file  . Marital Status: Not on file  Intimate Partner Violence:   . Fear of Current or Ex-Partner: Not on file  . Emotionally Abused: Not on file  . Physically Abused: Not on file  . Sexually Abused: Not on file    FAMILY HISTORY: Family History  Problem Relation Age of Onset  . Healthy Daughter     ALLERGIES:  is allergic to alendronate, allopurinol, amoxicillin-pot clavulanate, folic acid, methotrexate derivatives, latex, and tramadol.  MEDICATIONS:  Current Outpatient Medications  Medication Sig Dispense Refill  . ALPRAZolam (XANAX) 0.5 MG tablet One half tablet in am and one half tablet at lunch then one whole tablet hs 60 tablet 1  . aspirin 81 MG tablet Take 81 mg by mouth daily.     Marland Kitchen atorvastatin (LIPITOR) 20 MG tablet Take 1 tablet (20 mg total) by mouth daily. 90 tablet 1  . clotrimazole-betamethasone (LOTRISONE) cream Apply 1 application topically 2 (two) times daily. 45 g 2  . esomeprazole (NEXIUM) 40 MG capsule TAKE 1 CAPSULE BY MOUTH  DAILY IN THE MORNING 90 capsule 1  .  gabapentin (NEURONTIN) 300 MG capsule One or 2 capsules po qhs for leg cramps 60 capsule 3  . hydroxyurea (HYDREA) 500 MG capsule Take 1 cap (500mg ) once daily 5 days a week-Monday through Friday. Hold on satday and Sunday. May take with food to minimize GI side effects. 30 capsule 2  . lisinopril (ZESTRIL) 5 MG tablet TAKE 1 TABLET BY MOUTH  DAILY 90 tablet 3  . mupirocin ointment (BACTROBAN) 2 % APPLY TO THE AFFECTED AREA TWICE DAILY AS NEEDED 22 g 11  . NEOMYCIN-POLYMYXIN-HYDROCORTISONE (CORTISPORIN) 1 % SOLN OTIC solution Place 3 drops into the left ear 4 (four) times daily. 10 mL 0  . polyethylene glycol powder (GLYCOLAX/MIRALAX) powder MIX 1 CAPFUL IN 8 OUNCES OF WATER AND DRINK ONCE DAILY AS DIRECTED 527 g 0  . terconazole (TERAZOL 3) 0.8 % vaginal cream INSERT 1 APPLICATORFUL VAGINALLY AT BEDTIME 20 g 1  . Triamcinolone Acetonide (TRIAMCINOLONE 0.1 % CREAM : EUCERIN) CREA Apply 1 application topically 2 (two) times daily. 1 each 1  . triamcinolone cream (KENALOG) 0.1 % Apply 1 application topically 2 (two) times daily. 30 g 3   No current facility-administered medications for this visit.    REVIEW OF SYSTEMS:   A 10+ POINT REVIEW OF SYSTEMS WAS OBTAINED including neurology, dermatology, psychiatry, cardiac, respiratory, lymph, extremities, GI, GU, Musculoskeletal, constitutional, breasts, reproductive, HEENT.  All pertinent positives are noted in the HPI.  All others are negative.  PHYSICAL EXAMINATION: ECOG PERFORMANCE STATUS: 1 - Symptomatic but completely ambulatory   GENERAL:alert, in no acute distress and comfortable SKIN: no acute rashes, no significant lesions EYES: conjunctiva are pink and non-injected, sclera anicteric OROPHARYNX: MMM, no exudates, no oropharyngeal erythema or ulceration NECK: supple, no JVD LYMPH:  no palpable lymphadenopathy in the cervical, axillary or inguinal regions LUNGS: clear to auscultation b/l with normal respiratory effort HEART: regular rate &  rhythm ABDOMEN:  normoactive bowel sounds , non tender, not distended. No palpable hepatosplenomegaly.  Extremity: no pedal edema PSYCH: alert & oriented x 3 with fluent speech NEURO: no focal motor/sensory deficits  LABORATORY DATA:  I have reviewed the data as listed  . CBC Latest Ref Rng & Units 03/06/2020 01/31/2020 01/20/2020  WBC 4.0 - 10.5 K/uL 9.4 11.8(H) 12.7(H)  Hemoglobin 12.0 -  15.0 g/dL 10.1(L) 9.4(L) 10.0(L)  Hematocrit 36 - 46 % 31.4(L) 27.8(L) 29.9(L)  Platelets 150 - 400 K/uL 1,004(HH) 814(H) 1,208(H)   . CBC    Component Value Date/Time   WBC 9.4 03/06/2020 0929   RBC 3.05 (L) 03/06/2020 0929   HGB 10.1 (L) 03/06/2020 0929   HGB 10.4 (L) 10/01/2015 1558   HCT 31.4 (L) 03/06/2020 0929   HCT 31.0 (L) 10/01/2015 1558   PLT 1,004 (HH) 03/06/2020 0929   PLT 482 (H) 10/01/2015 1558   MCV 103.0 (H) 03/06/2020 0929   MCV 96.3 10/01/2015 1558   MCH 33.1 03/06/2020 0929   MCHC 32.2 03/06/2020 0929   RDW 18.5 (H) 03/06/2020 0929   RDW 15.6 (H) 10/01/2015 1558   LYMPHSABS 2.4 03/06/2020 0929   LYMPHSABS 2.5 10/01/2015 1558   MONOABS 1.1 (H) 03/06/2020 0929   MONOABS 0.7 10/01/2015 1558   EOSABS 0.2 03/06/2020 0929   EOSABS 0.2 10/01/2015 1558   BASOSABS 0.1 03/06/2020 0929   BASOSABS 0.0 10/01/2015 1558    . CMP Latest Ref Rng & Units 03/06/2020 01/31/2020 01/20/2020  Glucose 70 - 99 mg/dL 54(L) 85 111(H)  BUN 8 - 23 mg/dL 21 19 23   Creatinine 0.44 - 1.00 mg/dL 1.10(H) 0.96 1.15(H)  Sodium 135 - 145 mmol/L 140 126(L) 129(L)  Potassium 3.5 - 5.1 mmol/L 3.7 3.4(L) 4.9  Chloride 98 - 111 mmol/L 108 91(L) 95(L)  CO2 22 - 32 mmol/L 24 25 25   Calcium 8.9 - 10.3 mg/dL 8.8(L) 9.3 9.7  Total Protein 6.5 - 8.1 g/dL 7.0 7.5 7.0  Total Bilirubin 0.3 - 1.2 mg/dL 0.3 0.5 0.3  Alkaline Phos 38 - 126 U/L 82 78 -  AST 15 - 41 U/L 30 23 26   ALT 0 - 44 U/L 23 18 20      RADIOGRAPHIC STUDIES: I have personally reviewed the radiological images as listed and agreed with the  findings in the report. No results found.  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  PLAN: -Discussed pt labwork today, 03/06/20; WBC have normalized, Hgb is improved, PLT is still signficantly elevated, blood chemistries look good. -Advised pt that we will slowly increase Hydroxyurea dose, so that we do not cause increased anemia.  -Will increase Hydroxyurea to 500 mg 5x per week. The pt has no prohibitive toxicities at this time. -Advised pt that we typically see the full benefits of IV Iron about 8 weeks out.  -Advised pt that inflammation from RA can cause anemia and thrombocytosis. Recommend pt f/u with Dr. Dossie Der ASAP for RA management. -Low-dose Prednisone, Plaquenil, or Ritxuan would be reasonable to manage RA - will defer to Dr. Dossie Der.  -Advised pt that RA increases her risk of Osteoporosis. Recommend she begin 2000 IU Vitamin D daily. -Continue Aspirin, B-complex -Will see back in 4 weeks with labs   FOLLOW UP: RTC with Dr Irene Limbo with labs in 4 weeks   The total time spent in the appt was 20 minutes and more than 50% was on counseling and direct patient cares.  All of the patient's questions were answered with apparent satisfaction. The patient knows to call the clinic with any problems, questions or concerns.    Sullivan Lone MD [place] AAHIVMS [facility] Hematology/Oncology Physician  [place]  (Office):       [phone] (Work cell):  [phone] (Fax):           [phone]  03/06/2020 11:04 AM  I, Yevette Edwards, am acting as a scribe for Dr. Sullivan Lone.   .I have reviewed the above documentation for accuracy and completeness, and I agree with the above. Brunetta Genera MD

## 2020-03-06 ENCOUNTER — Inpatient Hospital Stay: Payer: Medicare Other | Admitting: Hematology

## 2020-03-06 ENCOUNTER — Inpatient Hospital Stay: Payer: Medicare Other

## 2020-03-06 ENCOUNTER — Other Ambulatory Visit: Payer: Self-pay

## 2020-03-06 VITALS — BP 167/77 | HR 73 | Temp 96.8°F | Resp 18 | Ht 59.5 in | Wt 144.0 lb

## 2020-03-06 DIAGNOSIS — D509 Iron deficiency anemia, unspecified: Secondary | ICD-10-CM

## 2020-03-06 DIAGNOSIS — Z86711 Personal history of pulmonary embolism: Secondary | ICD-10-CM | POA: Diagnosis not present

## 2020-03-06 DIAGNOSIS — D473 Essential (hemorrhagic) thrombocythemia: Secondary | ICD-10-CM

## 2020-03-06 DIAGNOSIS — D72829 Elevated white blood cell count, unspecified: Secondary | ICD-10-CM | POA: Diagnosis not present

## 2020-03-06 DIAGNOSIS — D649 Anemia, unspecified: Secondary | ICD-10-CM | POA: Diagnosis not present

## 2020-03-06 DIAGNOSIS — Z7982 Long term (current) use of aspirin: Secondary | ICD-10-CM | POA: Diagnosis not present

## 2020-03-06 LAB — CBC WITH DIFFERENTIAL/PLATELET
Abs Immature Granulocytes: 0.06 10*3/uL (ref 0.00–0.07)
Basophils Absolute: 0.1 10*3/uL (ref 0.0–0.1)
Basophils Relative: 1 %
Eosinophils Absolute: 0.2 10*3/uL (ref 0.0–0.5)
Eosinophils Relative: 2 %
HCT: 31.4 % — ABNORMAL LOW (ref 36.0–46.0)
Hemoglobin: 10.1 g/dL — ABNORMAL LOW (ref 12.0–15.0)
Immature Granulocytes: 1 %
Lymphocytes Relative: 25 %
Lymphs Abs: 2.4 10*3/uL (ref 0.7–4.0)
MCH: 33.1 pg (ref 26.0–34.0)
MCHC: 32.2 g/dL (ref 30.0–36.0)
MCV: 103 fL — ABNORMAL HIGH (ref 80.0–100.0)
Monocytes Absolute: 1.1 10*3/uL — ABNORMAL HIGH (ref 0.1–1.0)
Monocytes Relative: 12 %
Neutro Abs: 5.6 10*3/uL (ref 1.7–7.7)
Neutrophils Relative %: 59 %
Platelets: 1004 10*3/uL (ref 150–400)
RBC: 3.05 MIL/uL — ABNORMAL LOW (ref 3.87–5.11)
RDW: 18.5 % — ABNORMAL HIGH (ref 11.5–15.5)
WBC: 9.4 10*3/uL (ref 4.0–10.5)
nRBC: 1.3 % — ABNORMAL HIGH (ref 0.0–0.2)

## 2020-03-06 LAB — COMPREHENSIVE METABOLIC PANEL
ALT: 23 U/L (ref 0–44)
AST: 30 U/L (ref 15–41)
Albumin: 3.6 g/dL (ref 3.5–5.0)
Alkaline Phosphatase: 82 U/L (ref 38–126)
Anion gap: 8 (ref 5–15)
BUN: 21 mg/dL (ref 8–23)
CO2: 24 mmol/L (ref 22–32)
Calcium: 8.8 mg/dL — ABNORMAL LOW (ref 8.9–10.3)
Chloride: 108 mmol/L (ref 98–111)
Creatinine, Ser: 1.1 mg/dL — ABNORMAL HIGH (ref 0.44–1.00)
GFR, Estimated: 47 mL/min — ABNORMAL LOW (ref >60)
Glucose, Bld: 54 mg/dL — ABNORMAL LOW (ref 70–99)
Potassium: 3.7 mmol/L (ref 3.5–5.1)
Sodium: 140 mmol/L (ref 135–145)
Total Bilirubin: 0.3 mg/dL (ref 0.3–1.2)
Total Protein: 7 g/dL (ref 6.5–8.1)

## 2020-03-06 MED ORDER — HYDROXYUREA 500 MG PO CAPS: ORAL_CAPSULE | ORAL | 2 refills | Status: DC

## 2020-03-06 NOTE — Progress Notes (Signed)
CRITICAL VALUE STICKER CRITICAL VALUE: Plt 1,004K/ul DATE & TIME NOTIFIED:  59 MESSENGER (representative from lab): Joesphine Bare, RN MD NOTIFIED: Dr. Irene Limbo  TIME OF NOTIFICATION: 1020 RESPONSE: Acknowledged info/no new orders

## 2020-03-07 ENCOUNTER — Ambulatory Visit (INDEPENDENT_AMBULATORY_CARE_PROVIDER_SITE_OTHER): Payer: Medicare Other | Admitting: Internal Medicine

## 2020-03-07 ENCOUNTER — Encounter: Payer: Self-pay | Admitting: Internal Medicine

## 2020-03-07 VITALS — BP 150/80 | HR 88 | Temp 98.0°F | Ht 59.5 in | Wt 141.0 lb

## 2020-03-07 DIAGNOSIS — Z23 Encounter for immunization: Secondary | ICD-10-CM

## 2020-03-07 NOTE — Progress Notes (Signed)
Flu Vaccine per CMA 

## 2020-03-07 NOTE — Patient Instructions (Signed)
Patient received a flu vaccine IM L deltoid, AV, CMA  

## 2020-03-20 ENCOUNTER — Telehealth: Payer: Self-pay | Admitting: Hematology

## 2020-03-20 NOTE — Telephone Encounter (Signed)
Scheduled per 10/12 los, patient has been called and notified.

## 2020-04-03 ENCOUNTER — Telehealth: Payer: Self-pay

## 2020-04-03 ENCOUNTER — Other Ambulatory Visit: Payer: Self-pay

## 2020-04-03 ENCOUNTER — Inpatient Hospital Stay: Payer: Medicare Other | Attending: Hematology

## 2020-04-03 ENCOUNTER — Inpatient Hospital Stay: Payer: Medicare Other | Admitting: Hematology

## 2020-04-03 VITALS — BP 171/76 | HR 71 | Temp 97.4°F | Resp 16 | Ht 59.5 in | Wt 144.6 lb

## 2020-04-03 DIAGNOSIS — D509 Iron deficiency anemia, unspecified: Secondary | ICD-10-CM

## 2020-04-03 DIAGNOSIS — D473 Essential (hemorrhagic) thrombocythemia: Secondary | ICD-10-CM

## 2020-04-03 DIAGNOSIS — D649 Anemia, unspecified: Secondary | ICD-10-CM | POA: Diagnosis not present

## 2020-04-03 DIAGNOSIS — H402211 Chronic angle-closure glaucoma, right eye, mild stage: Secondary | ICD-10-CM | POA: Diagnosis not present

## 2020-04-03 DIAGNOSIS — D72829 Elevated white blood cell count, unspecified: Secondary | ICD-10-CM | POA: Diagnosis not present

## 2020-04-03 DIAGNOSIS — H402222 Chronic angle-closure glaucoma, left eye, moderate stage: Secondary | ICD-10-CM | POA: Diagnosis not present

## 2020-04-03 DIAGNOSIS — Z86711 Personal history of pulmonary embolism: Secondary | ICD-10-CM | POA: Insufficient documentation

## 2020-04-03 DIAGNOSIS — H35363 Drusen (degenerative) of macula, bilateral: Secondary | ICD-10-CM | POA: Diagnosis not present

## 2020-04-03 DIAGNOSIS — H26493 Other secondary cataract, bilateral: Secondary | ICD-10-CM | POA: Diagnosis not present

## 2020-04-03 DIAGNOSIS — Z7982 Long term (current) use of aspirin: Secondary | ICD-10-CM | POA: Insufficient documentation

## 2020-04-03 DIAGNOSIS — Z961 Presence of intraocular lens: Secondary | ICD-10-CM | POA: Diagnosis not present

## 2020-04-03 LAB — CBC WITH DIFFERENTIAL/PLATELET
Abs Immature Granulocytes: 0.06 10*3/uL (ref 0.00–0.07)
Basophils Absolute: 0.1 10*3/uL (ref 0.0–0.1)
Basophils Relative: 1 %
Eosinophils Absolute: 0.2 10*3/uL (ref 0.0–0.5)
Eosinophils Relative: 3 %
HCT: 33.2 % — ABNORMAL LOW (ref 36.0–46.0)
Hemoglobin: 11 g/dL — ABNORMAL LOW (ref 12.0–15.0)
Immature Granulocytes: 1 %
Lymphocytes Relative: 24 %
Lymphs Abs: 1.8 10*3/uL (ref 0.7–4.0)
MCH: 34.6 pg — ABNORMAL HIGH (ref 26.0–34.0)
MCHC: 33.1 g/dL (ref 30.0–36.0)
MCV: 104.4 fL — ABNORMAL HIGH (ref 80.0–100.0)
Monocytes Absolute: 0.9 10*3/uL (ref 0.1–1.0)
Monocytes Relative: 12 %
Neutro Abs: 4.7 10*3/uL (ref 1.7–7.7)
Neutrophils Relative %: 59 %
Platelets: 946 10*3/uL (ref 150–400)
RBC: 3.18 MIL/uL — ABNORMAL LOW (ref 3.87–5.11)
RDW: 19 % — ABNORMAL HIGH (ref 11.5–15.5)
WBC: 7.7 10*3/uL (ref 4.0–10.5)
nRBC: 0.3 % — ABNORMAL HIGH (ref 0.0–0.2)

## 2020-04-03 LAB — CMP (CANCER CENTER ONLY)
ALT: 64 U/L — ABNORMAL HIGH (ref 0–44)
AST: 39 U/L (ref 15–41)
Albumin: 3.6 g/dL (ref 3.5–5.0)
Alkaline Phosphatase: 116 U/L (ref 38–126)
Anion gap: 7 (ref 5–15)
BUN: 22 mg/dL (ref 8–23)
CO2: 26 mmol/L (ref 22–32)
Calcium: 9.3 mg/dL (ref 8.9–10.3)
Chloride: 106 mmol/L (ref 98–111)
Creatinine: 0.92 mg/dL (ref 0.44–1.00)
GFR, Estimated: >60 mL/min (ref >60)
Glucose, Bld: 64 mg/dL — ABNORMAL LOW (ref 70–99)
Potassium: 3.6 mmol/L (ref 3.5–5.1)
Sodium: 139 mmol/L (ref 135–145)
Total Bilirubin: 0.4 mg/dL (ref 0.3–1.2)
Total Protein: 7.4 g/dL (ref 6.5–8.1)

## 2020-04-03 LAB — RETICULOCYTES
Immature Retic Fract: 33.1 % — ABNORMAL HIGH (ref 2.3–15.9)
RBC.: 3.13 MIL/uL — ABNORMAL LOW (ref 3.87–5.11)
Retic Count, Absolute: 56 10*3/uL (ref 19.0–186.0)
Retic Ct Pct: 1.8 % (ref 0.4–3.1)

## 2020-04-03 MED ORDER — HYDROXYUREA 500 MG PO CAPS: ORAL_CAPSULE | ORAL | 2 refills | Status: DC

## 2020-04-03 MED ORDER — PREDNISONE 5 MG PO TABS: 5.0000 mg | ORAL_TABLET | Freq: Every day | ORAL | 0 refills | Status: DC

## 2020-04-03 NOTE — Progress Notes (Signed)
HEMATOLOGY/ONCOLOGY CLINIC NOTE  Date of Service: 04/03/2020  Patient Care Team: [name], MD as PCP - [name], MD as Consulting Physician (Gastroenterology) Valinda Party, MD (Rheumatology)  CHIEF COMPLAINTS/PURPOSE OF CONSULTATION:  Mx of essential thrombocytopenia.  HISTORY OF PRESENTING ILLNESS:   [name] is a wonderful 79 y.o. female who has been referred to [place] by Dr. Renold Genta for evaluation and management of thrombocytosis and anemia. Pt is accompanied today by her daughter. The pt reports that she is doing well overall.   The pt reports that she was diagnosed with Rheumatoid Arthritis three years ago. Pt is currently being seen by Dr. Dossie Der for her RA. Pt was using IV medication for a year, likely Methotrexate, to treat her RA before she began having an allergic reaction and was stopped. She was on Prednisone to treat for two weeks and was stopped a week ago due to rash, which as resolved. She also receives Cortizone knee injections periodically, which help for a short time. Pt reports that her joint stiffness improves within 30 minutes of waking up and beginning to move about. Pt has a follow up scheduled with Dr. Dossie Der in the beginning of December.  Pt had pulmonary embolism in 2012 that was thought to be caused by back surgery. Pt was treated with Coumadin for 6 months following her PE. She denies any heart attacks or strokes. Pt is currently taking three 81 mg Aspirin daily. Pt has IBS and complains of occasional abdominal discomfort. Pt has carpal tunnel and has received local injections, but surgery is not a current consideration. She has no family history of blood conditions or disorders. She is allergic to Allopurinol. She has received her COVID19 vaccines and plans on receiving the booster.   Most recent lab results (01/20/2020) of CBC and CMP is as follows: all values are WNL except for WBC at 12.7K, RBC at 3.02, Hgb at 10.0, HCT at 29.9, MCH at 33.1,  PLT at 1208K, Neutro Abs at 9309, Mono Abs at 1346, Eos Abs at 0, Glucose at 111, Creatinine at 1.15, GFR Est Af Am at 52, Sodium at 129, Chloride at 95. 01/19/2020 Sed Rate at 98  On review of systems, pt reports fingertip numbness and denies vision changes, SOB, leg swelling, fingertip discoloration, unexpected weight loss, fevers, chills, night sweats and any other symptoms.   On PMHx the pt reports Lumbar fusion, Rheumatoid Arthritis, Cervical Stenosis, IBS, PE, Carpal Tunnel. On Social Hx the pt reports that she is a non-smoker, but had significant second-hand exposure previously.   INTERVAL HISTORY:  [name] is a wonderful 79 y.o. female who is here for evaluation and management of Essential thrombocytosis and anemia. The patient's last visit with [place] was on 03/06/2020. The pt reports that she is doing well overall.  The pt reports that she is scheduled to see Dr. Dossie Der in the beginning of December. Pt is having to take Tylenol Arthritis fairly regularly, as much as once per day. Pt has been taking Hydroxyurea as prescribed and denies any issues with the mediation.   Lab results today (04/03/20) of CBC w/diff and CMP is as follows: all values are WNL except for RBC at 3.18, Hgb at 11.0, HCT at 33.2, MCV at 104.4, MCH at 34.6, RDW at 19.0, PLT at 946K, nRBC at 0.3, Glucose at 64, ALT at 64. 04/03/2020 Reticulocytes is as follows: Retic Ct Pct at 1.8, Retic Ct Abs at 56.0, Immature Retic Fract at  33.1  On review of systems, pt reports joint pain and denies N/V/D, skin rash, new bone pain and any other symptoms.   MEDICAL HISTORY:  Past Medical History:  Diagnosis Date  . Arthritis   . Cervical stenosis of spine   . Dysphasia   . Essential hypertension, benign   . GERD (gastroesophageal reflux disease)   . Glaucoma   . Heart murmur   . History of estrogen therapy   . Hypercholesteremia   . IBS (irritable bowel syndrome)   . Mitral regurgitation   . Pulmonary air embolism  ([place])    states she had this wiht her back surgery  . Rheumatoid arthritis ([place])   . Seborrhea    patient denies    SURGICAL HISTORY: Past Surgical History:  Procedure Laterality Date  . ANTERIOR AND POSTERIOR REPAIR WITH SACROSPINOUS FIXATION N/A 01/08/2015   Procedure: ANTERIOR AND POSTERIOR REPAIR WITH possible SACROSPINOUS FIXATION;  Surgeon: Molli Posey, MD;  Location: [facility];  Service: Gynecology;  Laterality: N/A;  . BACK SURGERY    . CATARACT EXTRACTION W/ INTRAOCULAR LENS  IMPLANT, BILATERAL Bilateral   . COLONOSCOPY    . EYE SURGERY    . LAPAROSCOPIC ASSISTED VAGINAL HYSTERECTOMY N/A 01/08/2015   Procedure: LAPAROSCOPIC ASSISTED VAGINAL HYSTERECTOMY;  Surgeon: Molli Posey, MD;  Location: [facility];  Service: Gynecology;  Laterality: N/A;  . LUMBAR FUSION  05/2010  . REVERSE SHOULDER ARTHROPLASTY Left 02/16/2014   Procedure: LEFT REVERSE TOTAL SHOULDER ARTHROPLASTY;  Surgeon: Marin Shutter, MD;  Location: [facility];  Service: Orthopedics;  Laterality: Left;  . REVERSE SHOULDER ARTHROPLASTY Right 08/07/2016  . REVERSE SHOULDER ARTHROPLASTY Right 08/07/2016   Procedure: REVERSE RIGHT SHOULDER ARTHROPLASTY;  Surgeon: Justice Britain, MD;  Location: [facility];  Service: Orthopedics;  Laterality: Right;  . TOE SURGERY    . TUBAL LIGATION      SOCIAL HISTORY: Social History   Socioeconomic History  . Marital status: Widowed    Spouse name: Not on file  . Number of children: 9  . Years of education: 7  . Highest education level: Not on file  Occupational History  . Not on file  Tobacco Use  . Smoking status: Never Smoker  . Smokeless tobacco: Never Used  Substance and Sexual Activity  . Alcohol use: No  . Drug use: No  . Sexual activity: Never  Other Topics Concern  . Not on file  Social History Narrative   Lives with daughter   Caffeine use: none   Social Determinants of Health   Financial Resource Strain:   . Difficulty of Paying Living Expenses: Not on file  Food  Insecurity:   . Worried About Charity fundraiser in the Last Year: Not on file  . Ran Out of Food in the Last Year: Not on file  Transportation Needs:   . Lack of Transportation (Medical): Not on file  . Lack of Transportation (Non-Medical): Not on file  Physical Activity:   . Days of Exercise per Week: Not on file  . Minutes of Exercise per Session: Not on file  Stress:   . Feeling of Stress : Not on file  Social Connections:   . Frequency of Communication with Friends and Family: Not on file  . Frequency of Social Gatherings with Friends and Family: Not on file  . Attends Religious Services: Not on file  . Active Member of Clubs or Organizations: Not on file  . Attends Archivist Meetings: Not on file  .  Marital Status: Not on file  Intimate Partner Violence:   . Fear of Current or Ex-Partner: Not on file  . Emotionally Abused: Not on file  . Physically Abused: Not on file  . Sexually Abused: Not on file    FAMILY HISTORY: Family History  Problem Relation Age of Onset  . Healthy Daughter     ALLERGIES:  is allergic to alendronate, allopurinol, amoxicillin-pot clavulanate, folic acid, methotrexate derivatives, latex, and tramadol.  MEDICATIONS:  Current Outpatient Medications  Medication Sig Dispense Refill  . ALPRAZolam (XANAX) 0.5 MG tablet One half tablet in am and one half tablet at lunch then one whole tablet hs 60 tablet 1  . aspirin 81 MG tablet Take 81 mg by mouth daily.     Marland Kitchen atorvastatin (LIPITOR) 20 MG tablet Take 1 tablet (20 mg total) by mouth daily. 90 tablet 1  . clotrimazole-betamethasone (LOTRISONE) cream Apply 1 application topically 2 (two) times daily. 45 g 2  . esomeprazole (NEXIUM) 40 MG capsule TAKE 1 CAPSULE BY MOUTH  DAILY IN THE MORNING 90 capsule 1  . gabapentin (NEURONTIN) 300 MG capsule One or 2 capsules po qhs for leg cramps 60 capsule 3  . hydroxyurea (HYDREA) 500 MG capsule Take 1 cap (500mg ) once daily 5 days a week-Monday through  Friday. 2 caps (1000mg ) once daily on saturday and Sunday. May take with food to minimize GI side effects. 30 capsule 2  . lisinopril (ZESTRIL) 5 MG tablet TAKE 1 TABLET BY MOUTH  DAILY 90 tablet 3  . mupirocin ointment (BACTROBAN) 2 % APPLY TO THE AFFECTED AREA TWICE DAILY AS NEEDED 22 g 11  . NEOMYCIN-POLYMYXIN-HYDROCORTISONE (CORTISPORIN) 1 % SOLN OTIC solution Place 3 drops into the left ear 4 (four) times daily. 10 mL 0  . polyethylene glycol powder (GLYCOLAX/MIRALAX) powder MIX 1 CAPFUL IN 8 OUNCES OF WATER AND DRINK ONCE DAILY AS DIRECTED 527 g 0  . terconazole (TERAZOL 3) 0.8 % vaginal cream INSERT 1 APPLICATORFUL VAGINALLY AT BEDTIME 20 g 1  . Triamcinolone Acetonide (TRIAMCINOLONE 0.1 % CREAM : EUCERIN) CREA Apply 1 application topically 2 (two) times daily. 1 each 1  . triamcinolone cream (KENALOG) 0.1 % Apply 1 application topically 2 (two) times daily. 30 g 3  . predniSONE (DELTASONE) 5 MG tablet Take 1 tablet (5 mg total) by mouth daily with breakfast. 30 tablet 0   No current facility-administered medications for this visit.    REVIEW OF SYSTEMS:   A 10+ POINT REVIEW OF SYSTEMS WAS OBTAINED including neurology, dermatology, psychiatry, cardiac, respiratory, lymph, extremities, GI, GU, Musculoskeletal, constitutional, breasts, reproductive, HEENT.  All pertinent positives are noted in the HPI.  All others are negative.   PHYSICAL EXAMINATION: ECOG PERFORMANCE STATUS: 1 - Symptomatic but completely ambulatory   GENERAL:alert, in no acute distress and comfortable SKIN: no acute rashes, no significant lesions EYES: conjunctiva are pink and non-injected, sclera anicteric OROPHARYNX: MMM, no exudates, no oropharyngeal erythema or ulceration NECK: supple, no JVD LYMPH:  no palpable lymphadenopathy in the cervical, axillary or inguinal regions LUNGS: clear to auscultation b/l with normal respiratory effort HEART: regular rate & rhythm ABDOMEN:  normoactive bowel sounds , non tender,  not distended. No palpable hepatosplenomegaly.  Extremity: no pedal edema PSYCH: alert & oriented x 3 with fluent speech NEURO: no focal motor/sensory deficits  LABORATORY DATA:  I have reviewed the data as listed  . CBC Latest Ref Rng & Units 04/03/2020 03/06/2020 01/31/2020  WBC 4.0 - 10.5 K/uL 7.7 9.4  11.8(H)  Hemoglobin 12.0 - 15.0 g/dL 11.0(L) 10.1(L) 9.4(L)  Hematocrit 36 - 46 % 33.2(L) 31.4(L) 27.8(L)  Platelets 150 - 400 K/uL 946(HH) 1,004(HH) 814(H)   . CBC    Component Value Date/Time   WBC 7.7 04/03/2020 0914   RBC 3.18 (L) 04/03/2020 0914   RBC 3.13 (L) 04/03/2020 0913   HGB 11.0 (L) 04/03/2020 0914   HGB 10.4 (L) 10/01/2015 1558   HCT 33.2 (L) 04/03/2020 0914   HCT 31.0 (L) 10/01/2015 1558   PLT 946 (HH) 04/03/2020 0914   PLT 482 (H) 10/01/2015 1558   MCV 104.4 (H) 04/03/2020 0914   MCV 96.3 10/01/2015 1558   MCH 34.6 (H) 04/03/2020 0914   MCHC 33.1 04/03/2020 0914   RDW 19.0 (H) 04/03/2020 0914   RDW 15.6 (H) 10/01/2015 1558   LYMPHSABS 1.8 04/03/2020 0914   LYMPHSABS 2.5 10/01/2015 1558   MONOABS 0.9 04/03/2020 0914   MONOABS 0.7 10/01/2015 1558   EOSABS 0.2 04/03/2020 0914   EOSABS 0.2 10/01/2015 1558   BASOSABS 0.1 04/03/2020 0914   BASOSABS 0.0 10/01/2015 1558    . CMP Latest Ref Rng & Units 04/03/2020 03/06/2020 01/31/2020  Glucose 70 - 99 mg/dL 64(L) 54(L) 85  BUN 8 - 23 mg/dL 22 21 19   Creatinine 0.44 - 1.00 mg/dL 0.92 1.10(H) 0.96  Sodium 135 - 145 mmol/L 139 140 126(L)  Potassium 3.5 - 5.1 mmol/L 3.6 3.7 3.4(L)  Chloride 98 - 111 mmol/L 106 108 91(L)  CO2 22 - 32 mmol/L 26 24 25   Calcium 8.9 - 10.3 mg/dL 9.3 8.8(L) 9.3  Total Protein 6.5 - 8.1 g/dL 7.4 7.0 7.5  Total Bilirubin 0.3 - 1.2 mg/dL 0.4 0.3 0.5  Alkaline Phos 38 - 126 U/L 116 82 78  AST 15 - 41 U/L 39 30 23  ALT 0 - 44 U/L 64(H) 23 18     RADIOGRAPHIC STUDIES: I have personally reviewed the radiological images as listed and agreed with the findings in the report. No results  found.  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  PLAN: -Discussed pt labwork today, 04/03/20; anemia is improving, PLT still high, blood chemistries are stable, Immature Retic Fract is elevated. -The pt has no prohibitive toxicities from continuing 500 mg Hydroxyrea 5x per week and 1000 mg 2x per week (Sat & Sun) at this time.  -Advised pt that inflammation from RA will continue to contribute to her anemia and reactive thrombocytopenia. -Will give pt 5 mg Prednisone to improve inflammation from RA - will allow Rheumatology to adjust as needed. -Recommend pt connect with Dr. Dossie Der for an earlier appointment if possible.  -Continue 2000 IU Vitamin D daily. -Continue Aspirin, B-complex -Rx Prednisone -Will see back in 4 weeks with labs   FOLLOW UP: RTC with Dr Irene Limbo with labs in 4 weeks   The total time spent in the appt was 20 minutes and more than 50% was on counseling and direct patient cares.  All of the patient's questions were answered with apparent satisfaction. The patient knows to call the clinic with any problems, questions or concerns.    Sullivan Lone MD [place] AAHIVMS [facility] Hematology/Oncology Physician [facility]  (Office):       [phone] (Work  cell):  [phone] (Fax):           [phone]  04/03/2020 10:30 AM  I, Yevette Edwards, am acting as a scribe for Dr. Sullivan Lone.   .I have reviewed the above documentation for accuracy and completeness, and I agree with the above. Brunetta Genera MD

## 2020-04-03 NOTE — Telephone Encounter (Signed)
Critical Value: Platelets 946K Daralene Milch, LPN notified.

## 2020-04-05 DIAGNOSIS — Z79899 Other long term (current) drug therapy: Secondary | ICD-10-CM | POA: Diagnosis not present

## 2020-04-05 DIAGNOSIS — E790 Hyperuricemia without signs of inflammatory arthritis and tophaceous disease: Secondary | ICD-10-CM | POA: Diagnosis not present

## 2020-04-05 DIAGNOSIS — G5600 Carpal tunnel syndrome, unspecified upper limb: Secondary | ICD-10-CM | POA: Diagnosis not present

## 2020-04-05 DIAGNOSIS — M059 Rheumatoid arthritis with rheumatoid factor, unspecified: Secondary | ICD-10-CM | POA: Diagnosis not present

## 2020-04-05 DIAGNOSIS — M1990 Unspecified osteoarthritis, unspecified site: Secondary | ICD-10-CM | POA: Diagnosis not present

## 2020-05-02 ENCOUNTER — Inpatient Hospital Stay: Payer: Medicare Other | Admitting: Hematology

## 2020-05-02 ENCOUNTER — Inpatient Hospital Stay: Payer: Medicare Other | Attending: Hematology

## 2020-05-02 ENCOUNTER — Other Ambulatory Visit: Payer: Self-pay

## 2020-05-02 VITALS — BP 165/77 | HR 75 | Temp 98.2°F | Resp 15 | Ht 59.0 in | Wt 146.6 lb

## 2020-05-02 DIAGNOSIS — Z7982 Long term (current) use of aspirin: Secondary | ICD-10-CM | POA: Diagnosis not present

## 2020-05-02 DIAGNOSIS — D649 Anemia, unspecified: Secondary | ICD-10-CM | POA: Insufficient documentation

## 2020-05-02 DIAGNOSIS — D509 Iron deficiency anemia, unspecified: Secondary | ICD-10-CM

## 2020-05-02 DIAGNOSIS — D72829 Elevated white blood cell count, unspecified: Secondary | ICD-10-CM | POA: Insufficient documentation

## 2020-05-02 DIAGNOSIS — D473 Essential (hemorrhagic) thrombocythemia: Secondary | ICD-10-CM | POA: Diagnosis not present

## 2020-05-02 DIAGNOSIS — Z86711 Personal history of pulmonary embolism: Secondary | ICD-10-CM | POA: Diagnosis not present

## 2020-05-02 DIAGNOSIS — K219 Gastro-esophageal reflux disease without esophagitis: Secondary | ICD-10-CM | POA: Insufficient documentation

## 2020-05-02 LAB — CBC WITH DIFFERENTIAL/PLATELET
Abs Immature Granulocytes: 0.02 10*3/uL (ref 0.00–0.07)
Basophils Absolute: 0 10*3/uL (ref 0.0–0.1)
Basophils Relative: 1 %
Eosinophils Absolute: 0.1 10*3/uL (ref 0.0–0.5)
Eosinophils Relative: 2 %
HCT: 32.7 % — ABNORMAL LOW (ref 36.0–46.0)
Hemoglobin: 10.9 g/dL — ABNORMAL LOW (ref 12.0–15.0)
Immature Granulocytes: 0 %
Lymphocytes Relative: 24 %
Lymphs Abs: 1.7 10*3/uL (ref 0.7–4.0)
MCH: 36 pg — ABNORMAL HIGH (ref 26.0–34.0)
MCHC: 33.3 g/dL (ref 30.0–36.0)
MCV: 107.9 fL — ABNORMAL HIGH (ref 80.0–100.0)
Monocytes Absolute: 0.9 10*3/uL (ref 0.1–1.0)
Monocytes Relative: 13 %
Neutro Abs: 4.3 10*3/uL (ref 1.7–7.7)
Neutrophils Relative %: 60 %
Platelets: 826 10*3/uL — ABNORMAL HIGH (ref 150–400)
RBC: 3.03 MIL/uL — ABNORMAL LOW (ref 3.87–5.11)
RDW: 19.4 % — ABNORMAL HIGH (ref 11.5–15.5)
WBC: 7.1 10*3/uL (ref 4.0–10.5)
nRBC: 0 % (ref 0.0–0.2)

## 2020-05-02 LAB — COMPREHENSIVE METABOLIC PANEL
ALT: 20 U/L (ref 0–44)
AST: 31 U/L (ref 15–41)
Albumin: 3.5 g/dL (ref 3.5–5.0)
Alkaline Phosphatase: 70 U/L (ref 38–126)
Anion gap: 6 (ref 5–15)
BUN: 18 mg/dL (ref 8–23)
CO2: 29 mmol/L (ref 22–32)
Calcium: 9.7 mg/dL (ref 8.9–10.3)
Chloride: 105 mmol/L (ref 98–111)
Creatinine, Ser: 1 mg/dL (ref 0.44–1.00)
GFR, Estimated: 57 mL/min — ABNORMAL LOW (ref >60)
Glucose, Bld: 69 mg/dL — ABNORMAL LOW (ref 70–99)
Potassium: 4 mmol/L (ref 3.5–5.1)
Sodium: 140 mmol/L (ref 135–145)
Total Bilirubin: 0.4 mg/dL (ref 0.3–1.2)
Total Protein: 6.9 g/dL (ref 6.5–8.1)

## 2020-05-02 NOTE — Progress Notes (Signed)
HEMATOLOGY/ONCOLOGY CLINIC NOTE  Date of Service: 05/02/2020  Patient Care Team: [name], MD as PCP - [name], MD as Consulting Physician (Gastroenterology) Valinda Party, MD (Rheumatology)  CHIEF COMPLAINTS/PURPOSE OF CONSULTATION:  Mx of essential thrombocytopenia.  HISTORY OF PRESENTING ILLNESS:   [name] is a wonderful 79 y.o. female who has been referred to [place] by Dr. Renold Genta for evaluation and management of thrombocytosis and anemia. Pt is accompanied today by her daughter. The pt reports that she is doing well overall.   The pt reports that she was diagnosed with Rheumatoid Arthritis three years ago. Pt is currently being seen by Dr. Dossie Der for her RA. Pt was using IV medication for a year, likely Methotrexate, to treat her RA before she began having an allergic reaction and was stopped. She was on Prednisone to treat for two weeks and was stopped a week ago due to rash, which as resolved. She also receives Cortizone knee injections periodically, which help for a short time. Pt reports that her joint stiffness improves within 30 minutes of waking up and beginning to move about. Pt has a follow up scheduled with Dr. Dossie Der in the beginning of December.  Pt had pulmonary embolism in 2012 that was thought to be caused by back surgery. Pt was treated with Coumadin for 6 months following her PE. She denies any heart attacks or strokes. Pt is currently taking three 81 mg Aspirin daily. Pt has IBS and complains of occasional abdominal discomfort. Pt has carpal tunnel and has received local injections, but surgery is not a current consideration. She has no family history of blood conditions or disorders. She is allergic to Allopurinol. She has received her COVID19 vaccines and plans on receiving the booster.   Most recent lab results (01/20/2020) of CBC and CMP is as follows: all values are WNL except for WBC at 12.7K, RBC at 3.02, Hgb at 10.0, HCT at 29.9, MCH at 33.1,  PLT at 1208K, Neutro Abs at 9309, Mono Abs at 1346, Eos Abs at 0, Glucose at 111, Creatinine at 1.15, GFR Est Af Am at 52, Sodium at 129, Chloride at 95. 01/19/2020 Sed Rate at 98  On review of systems, pt reports fingertip numbness and denies vision changes, SOB, leg swelling, fingertip discoloration, unexpected weight loss, fevers, chills, night sweats and any other symptoms.   On PMHx the pt reports Lumbar fusion, Rheumatoid Arthritis, Cervical Stenosis, IBS, PE, Carpal Tunnel. On Social Hx the pt reports that she is a non-smoker, but had significant second-hand exposure previously.   INTERVAL HISTORY: [name] is a wonderful 79 y.o. female who is here for evaluation and management of Essential thrombocytosis and anemia. The patient's last visit with [place] was on 04/03/2020. The pt reports that she is doing well overall.  The pt reports that she has completed her month's supply of Prednisone and has less joint pain since starting. She has seen her Rheumatologist in the interim, who wanted her to continue Prednisone for RA management. Pt notes acid reflux for which she takes OTC Magnesium hydroxide and eats yogurt. She is currently taking three baby Aspirin per day. She denies issues taking Hydroxyurea at this time.  Lab results today (05/02/20) of CBC w/diff and CMP is as follows: all values are WNL except for RBC at 3.03, Hgb at 10.9, HCT at 32.7, MCV at 107.9, MCH at 36.0, RDW at 19.4, PLT at 826K, Glucose at 69, GFR Est at 57.  On review of systems, pt denies back pain, joint pain, rash, leg swelling and any other symptoms.   MEDICAL HISTORY:  Past Medical History:  Diagnosis Date  . Arthritis   . Cervical stenosis of spine   . Dysphasia   . Essential hypertension, benign   . GERD (gastroesophageal reflux disease)   . Glaucoma   . Heart murmur   . History of estrogen therapy   . Hypercholesteremia   . IBS (irritable bowel syndrome)   . Mitral regurgitation   . Pulmonary air  embolism ([place])    states she had this wiht her back surgery  . Rheumatoid arthritis ([place])   . Seborrhea    patient denies    SURGICAL HISTORY: Past Surgical History:  Procedure Laterality Date  . ANTERIOR AND POSTERIOR REPAIR WITH SACROSPINOUS FIXATION N/A 01/08/2015   Procedure: ANTERIOR AND POSTERIOR REPAIR WITH possible SACROSPINOUS FIXATION;  Surgeon: Molli Posey, MD;  Location: [facility];  Service: Gynecology;  Laterality: N/A;  . BACK SURGERY    . CATARACT EXTRACTION W/ INTRAOCULAR LENS  IMPLANT, BILATERAL Bilateral   . COLONOSCOPY    . EYE SURGERY    . LAPAROSCOPIC ASSISTED VAGINAL HYSTERECTOMY N/A 01/08/2015   Procedure: LAPAROSCOPIC ASSISTED VAGINAL HYSTERECTOMY;  Surgeon: Molli Posey, MD;  Location: [facility];  Service: Gynecology;  Laterality: N/A;  . LUMBAR FUSION  05/2010  . REVERSE SHOULDER ARTHROPLASTY Left 02/16/2014   Procedure: LEFT REVERSE TOTAL SHOULDER ARTHROPLASTY;  Surgeon: Marin Shutter, MD;  Location: [facility];  Service: Orthopedics;  Laterality: Left;  . REVERSE SHOULDER ARTHROPLASTY Right 08/07/2016  . REVERSE SHOULDER ARTHROPLASTY Right 08/07/2016   Procedure: REVERSE RIGHT SHOULDER ARTHROPLASTY;  Surgeon: Justice Britain, MD;  Location: [facility];  Service: Orthopedics;  Laterality: Right;  . TOE SURGERY    . TUBAL LIGATION      SOCIAL HISTORY: Social History   Socioeconomic History  . Marital status: Widowed    Spouse name: Not on file  . Number of children: 9  . Years of education: 7  . Highest education level: Not on file  Occupational History  . Not on file  Tobacco Use  . Smoking status: Never Smoker  . Smokeless tobacco: Never Used  Substance and Sexual Activity  . Alcohol use: No  . Drug use: No  . Sexual activity: Never  Other Topics Concern  . Not on file  Social History Narrative   Lives with daughter   Caffeine use: none   Social Determinants of Health   Financial Resource Strain:   . Difficulty of Paying Living Expenses: Not on file   Food Insecurity:   . Worried About Charity fundraiser in the Last Year: Not on file  . Ran Out of Food in the Last Year: Not on file  Transportation Needs:   . Lack of Transportation (Medical): Not on file  . Lack of Transportation (Non-Medical): Not on file  Physical Activity:   . Days of Exercise per Week: Not on file  . Minutes of Exercise per Session: Not on file  Stress:   . Feeling of Stress : Not on file  Social Connections:   . Frequency of Communication with Friends and Family: Not on file  . Frequency of Social Gatherings with Friends and Family: Not on file  . Attends Religious Services: Not on file  . Active Member of Clubs or Organizations: Not on file  . Attends Archivist Meetings: Not on file  . Marital Status: Not on  file  Intimate Partner Violence:   . Fear of Current or Ex-Partner: Not on file  . Emotionally Abused: Not on file  . Physically Abused: Not on file  . Sexually Abused: Not on file    FAMILY HISTORY: Family History  Problem Relation Age of Onset  . Healthy Daughter     ALLERGIES:  is allergic to alendronate, allopurinol, amoxicillin-pot clavulanate, folic acid, methotrexate derivatives, latex, and tramadol.  MEDICATIONS:  Current Outpatient Medications  Medication Sig Dispense Refill  . ALPRAZolam (XANAX) 0.5 MG tablet One half tablet in am and one half tablet at lunch then one whole tablet hs 60 tablet 1  . aspirin 81 MG tablet Take 81 mg by mouth daily.     Marland Kitchen atorvastatin (LIPITOR) 20 MG tablet Take 1 tablet (20 mg total) by mouth daily. 90 tablet 1  . clotrimazole-betamethasone (LOTRISONE) cream Apply 1 application topically 2 (two) times daily. 45 g 2  . esomeprazole (NEXIUM) 40 MG capsule TAKE 1 CAPSULE BY MOUTH  DAILY IN THE MORNING 90 capsule 1  . gabapentin (NEURONTIN) 300 MG capsule One or 2 capsules po qhs for leg cramps 60 capsule 3  . hydroxyurea (HYDREA) 500 MG capsule Take 1 cap (500mg ) once daily 5 days a week-Monday  through Friday. 2 caps (1000mg ) once daily on saturday and Sunday. May take with food to minimize GI side effects. 30 capsule 2  . lisinopril (ZESTRIL) 5 MG tablet TAKE 1 TABLET BY MOUTH  DAILY 90 tablet 3  . mupirocin ointment (BACTROBAN) 2 % APPLY TO THE AFFECTED AREA TWICE DAILY AS NEEDED 22 g 11  . NEOMYCIN-POLYMYXIN-HYDROCORTISONE (CORTISPORIN) 1 % SOLN OTIC solution Place 3 drops into the left ear 4 (four) times daily. 10 mL 0  . polyethylene glycol powder (GLYCOLAX/MIRALAX) powder MIX 1 CAPFUL IN 8 OUNCES OF WATER AND DRINK ONCE DAILY AS DIRECTED 527 g 0  . predniSONE (DELTASONE) 5 MG tablet Take 1 tablet (5 mg total) by mouth daily with breakfast. 30 tablet 0  . terconazole (TERAZOL 3) 0.8 % vaginal cream INSERT 1 APPLICATORFUL VAGINALLY AT BEDTIME 20 g 1  . Triamcinolone Acetonide (TRIAMCINOLONE 0.1 % CREAM : EUCERIN) CREA Apply 1 application topically 2 (two) times daily. 1 each 1  . triamcinolone cream (KENALOG) 0.1 % Apply 1 application topically 2 (two) times daily. 30 g 3   No current facility-administered medications for this visit.    REVIEW OF SYSTEMS:   A 10+ POINT REVIEW OF SYSTEMS WAS OBTAINED including neurology, dermatology, psychiatry, cardiac, respiratory, lymph, extremities, GI, GU, Musculoskeletal, constitutional, breasts, reproductive, HEENT.  All pertinent positives are noted in the HPI.  All others are negative.   PHYSICAL EXAMINATION: ECOG PERFORMANCE STATUS: 1 - Symptomatic but completely ambulatory   GENERAL:alert, in no acute distress and comfortable SKIN: no acute rashes, no significant lesions EYES: conjunctiva are pink and non-injected, sclera anicteric OROPHARYNX: MMM, no exudates, no oropharyngeal erythema or ulceration NECK: supple, no JVD LYMPH:  no palpable lymphadenopathy in the cervical, axillary or inguinal regions LUNGS: clear to auscultation b/l with normal respiratory effort HEART: regular rate & rhythm ABDOMEN:  normoactive bowel sounds , non  tender, not distended. No palpable hepatosplenomegaly.  Extremity: no pedal edema PSYCH: alert & oriented x 3 with fluent speech NEURO: no focal motor/sensory deficits  LABORATORY DATA:  I have reviewed the data as listed  . CBC Latest Ref Rng & Units 04/03/2020 03/06/2020 01/31/2020  WBC 4.0 - 10.5 K/uL 7.7 9.4 11.8(H)  Hemoglobin 12.0 -  15.0 g/dL 11.0(L) 10.1(L) 9.4(L)  Hematocrit 36 - 46 % 33.2(L) 31.4(L) 27.8(L)  Platelets 150 - 400 K/uL 946(HH) 1,004(HH) 814(H)   . CBC    Component Value Date/Time   WBC 7.7 04/03/2020 0914   RBC 3.18 (L) 04/03/2020 0914   RBC 3.13 (L) 04/03/2020 0913   HGB 11.0 (L) 04/03/2020 0914   HGB 10.4 (L) 10/01/2015 1558   HCT 33.2 (L) 04/03/2020 0914   HCT 31.0 (L) 10/01/2015 1558   PLT 946 (HH) 04/03/2020 0914   PLT 482 (H) 10/01/2015 1558   MCV 104.4 (H) 04/03/2020 0914   MCV 96.3 10/01/2015 1558   MCH 34.6 (H) 04/03/2020 0914   MCHC 33.1 04/03/2020 0914   RDW 19.0 (H) 04/03/2020 0914   RDW 15.6 (H) 10/01/2015 1558   LYMPHSABS 1.8 04/03/2020 0914   LYMPHSABS 2.5 10/01/2015 1558   MONOABS 0.9 04/03/2020 0914   MONOABS 0.7 10/01/2015 1558   EOSABS 0.2 04/03/2020 0914   EOSABS 0.2 10/01/2015 1558   BASOSABS 0.1 04/03/2020 0914   BASOSABS 0.0 10/01/2015 1558    . CMP Latest Ref Rng & Units 04/03/2020 03/06/2020 01/31/2020  Glucose 70 - 99 mg/dL 64(L) 54(L) 85  BUN 8 - 23 mg/dL 22 21 19   Creatinine 0.44 - 1.00 mg/dL 0.92 1.10(H) 0.96  Sodium 135 - 145 mmol/L 139 140 126(L)  Potassium 3.5 - 5.1 mmol/L 3.6 3.7 3.4(L)  Chloride 98 - 111 mmol/L 106 108 91(L)  CO2 22 - 32 mmol/L 26 24 25   Calcium 8.9 - 10.3 mg/dL 9.3 8.8(L) 9.3  Total Protein 6.5 - 8.1 g/dL 7.4 7.0 7.5  Total Bilirubin 0.3 - 1.2 mg/dL 0.4 0.3 0.5  Alkaline Phos 38 - 126 U/L 116 82 78  AST 15 - 41 U/L 39 30 23  ALT 0 - 44 U/L 64(H) 23 18     RADIOGRAPHIC STUDIES: I have personally reviewed the radiological images as listed and agreed with the findings in the report. No  results found.  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  PLAN: -Discussed pt labwork today, 05/02/20; WBC & Hgb holding steady, PLT have not dropped much, blood chemistries are okay. -Advised pt that although her other labs are stable, PLT are not dropping quickly enough.  -Recommend pt increase Hydroxyurea dose to 1000 mg 4x per week and 500 mg 3x per week. No prohibitive toxicities at this time.  -Recommend pt f/u with Rheumatology for RA management -Recommend pt take OTC acid suppressant if acid reflux is persistent.  -Recommend pt take one baby ASA daily if she is experiencing significant stomach discomfort.  -Continue 2000 IU Vitamin D daily. -Continue Aspirin, B-complex -Will see back in 6 weeks via phone -Will get labs 1 day prior to visit   FOLLOW UP: Phone visit with Dr Irene Limbo in 6 weeks. Labs 1 days prior to phone visit   The total time spent in the appt was 20 minutes and more than 50% was on counseling and direct patient cares.  All of the patient's questions were answered with apparent satisfaction. The patient knows to call the clinic with any problems, questions or concerns.    Sullivan Lone MD [place] AAHIVMS [facility] Hematology/Oncology Physician [place]  (Office):  [phone] (Work cell):  [phone] (Fax):           [phone]  05/02/2020 7:25 AM  I, Yevette Edwards, am acting as a scribe for Dr. Sullivan Lone.   .I have reviewed the above documentation for accuracy and completeness, and I agree with the above. Brunetta Genera MD

## 2020-05-10 ENCOUNTER — Other Ambulatory Visit: Payer: Self-pay | Admitting: Internal Medicine

## 2020-05-16 ENCOUNTER — Other Ambulatory Visit: Payer: Self-pay | Admitting: Hematology

## 2020-06-07 ENCOUNTER — Other Ambulatory Visit: Payer: Self-pay | Admitting: Internal Medicine

## 2020-06-12 ENCOUNTER — Inpatient Hospital Stay: Payer: Medicare Other

## 2020-06-12 ENCOUNTER — Telehealth: Payer: Self-pay | Admitting: *Deleted

## 2020-06-12 NOTE — Telephone Encounter (Signed)
Error

## 2020-06-13 ENCOUNTER — Telehealth: Payer: Self-pay | Admitting: Hematology

## 2020-06-13 ENCOUNTER — Inpatient Hospital Stay: Payer: Medicare Other | Admitting: Hematology

## 2020-06-13 NOTE — Telephone Encounter (Signed)
Scheduled appointment per 1/18 sch msg. Spoke to patient who is aware of appointment date and time.  

## 2020-06-14 ENCOUNTER — Inpatient Hospital Stay: Payer: Medicare Other | Attending: Hematology

## 2020-06-14 DIAGNOSIS — K219 Gastro-esophageal reflux disease without esophagitis: Secondary | ICD-10-CM | POA: Insufficient documentation

## 2020-06-14 DIAGNOSIS — D72829 Elevated white blood cell count, unspecified: Secondary | ICD-10-CM | POA: Insufficient documentation

## 2020-06-14 DIAGNOSIS — Z86711 Personal history of pulmonary embolism: Secondary | ICD-10-CM | POA: Insufficient documentation

## 2020-06-14 DIAGNOSIS — D473 Essential (hemorrhagic) thrombocythemia: Secondary | ICD-10-CM | POA: Insufficient documentation

## 2020-06-14 DIAGNOSIS — D649 Anemia, unspecified: Secondary | ICD-10-CM | POA: Insufficient documentation

## 2020-06-14 DIAGNOSIS — Z7982 Long term (current) use of aspirin: Secondary | ICD-10-CM | POA: Insufficient documentation

## 2020-06-15 ENCOUNTER — Ambulatory Visit: Payer: Medicare Other | Admitting: Hematology

## 2020-06-17 NOTE — Progress Notes (Signed)
HEMATOLOGY/ONCOLOGY CLINIC NOTE  Date of Service: 06/17/2020  Patient Care Team: Margaree Mackintosh, MD as PCP - Ricky Stabs, MD as Consulting Physician (Gastroenterology) Rossie Muskrat, MD (Rheumatology)  CHIEF COMPLAINTS/PURPOSE OF CONSULTATION:  Mx of essential thrombocytopenia.  HISTORY OF PRESENTING ILLNESS:   [name] is a wonderful 80 y.o. female who has been referred to [place] by Dr. Lenord Fellers for evaluation and management of thrombocytosis and anemia. Pt is accompanied today by her daughter. The pt reports that she is doing well overall.   The pt reports that she was diagnosed with Rheumatoid Arthritis three years ago. Pt is currently being seen by Dr. Kathi Ludwig for her RA. Pt was using IV medication for a year, likely Methotrexate, to treat her RA before she began having an allergic reaction and was stopped. She was on Prednisone to treat for two weeks and was stopped a week ago due to rash, which as resolved. She also receives Cortizone knee injections periodically, which help for a short time. Pt reports that her joint stiffness improves within 30 minutes of waking up and beginning to move about. Pt has a follow up scheduled with Dr. Kathi Ludwig in the beginning of December.  Pt had pulmonary embolism in 2012 that was thought to be caused by back surgery. Pt was treated with Coumadin for 6 months following her PE. She denies any heart attacks or strokes. Pt is currently taking three 81 mg Aspirin daily. Pt has IBS and complains of occasional abdominal discomfort. Pt has carpal tunnel and has received local injections, but surgery is not a current consideration. She has no family history of blood conditions or disorders. She is allergic to Allopurinol. She has received her COVID19 vaccines and plans on receiving the booster.   Most recent lab results (01/20/2020) of CBC and CMP is as follows: all values are WNL except for WBC at 12.7K, RBC at 3.02, Hgb at 10.0, HCT at 29.9, MCH at 33.1,  PLT at 1208K, Neutro Abs at 9309, Mono Abs at 1346, Eos Abs at 0, Glucose at 111, Creatinine at 1.15, GFR Est Af Am at 52, Sodium at 129, Chloride at 95. 01/19/2020 Sed Rate at 98  On review of systems, pt reports fingertip numbness and denies vision changes, SOB, leg swelling, fingertip discoloration, unexpected weight loss, fevers, chills, night sweats and any other symptoms.   On PMHx the pt reports Lumbar fusion, Rheumatoid Arthritis, Cervical Stenosis, IBS, PE, Carpal Tunnel. On Social Hx the pt reports that she is a non-smoker, but had significant second-hand exposure previously.   INTERVAL HISTORY:  I connected with Herbert Deaner on 80 y.o. female by telephone and verified that I am speaking with the correct person using two identifiers.   I discussed the limitations of evaluation and management by telemedicine. The patient expressed understanding and agreed to proceed.   Other persons participating in the visit and their role in the encounter:                                                             - Minda Meo, Medical Scribe                - Jodelle Red, MD     Patient's location: Home Provider's location: [facility] at  [place] [place]  [name] is a wonderful 80 y.o. female who is here for evaluation and management of Essential thrombocytosis and anemia. The patient's last visit with [place] was on 05/02/2020. The pt reports that she is doing well overall.  The pt reports no new symptoms or concerns. She has been doing well. The pt will get labs following clinical visit and will be contacted via telephone later today with results and updated plan.   Lab results today 06/18/2020 of CBC w/diff and CMP is as follows: all values are WNL except for Potassium at 3.1, GFR est at 59, RBC at 2.85, Hgb at 10.6, HCT at 31.4, MCV at 110.2, MCH at 37.2, RDW at 17.2, Plt at 797K, nRBC at 0.4.  06/18/2020 Ferritin at 793. 06/18/2020 Iron and TIBC all WNL. Iron at 115 and Sat ratio of  42. 06/18/2020 Reticulocytes all WNL except Immature Retic Fract at 38.3. 06/18/2020 Sedimentation rate of 26.  On review of systems, pt denies any symptoms.  MEDICAL HISTORY:  Past Medical History:  Diagnosis Date  . Arthritis   . Cervical stenosis of spine   . Dysphasia   . Essential hypertension, benign   . GERD (gastroesophageal reflux disease)   . Glaucoma   . Heart murmur   . History of estrogen therapy   . Hypercholesteremia   . IBS (irritable bowel syndrome)   . Mitral regurgitation   . Pulmonary air embolism ([place])    states she had this wiht her back surgery  . Rheumatoid arthritis ([place])   . Seborrhea    patient denies    SURGICAL HISTORY: Past Surgical History:  Procedure Laterality Date  . ANTERIOR AND POSTERIOR REPAIR WITH SACROSPINOUS FIXATION N/A 01/08/2015   Procedure: ANTERIOR AND POSTERIOR REPAIR WITH possible SACROSPINOUS FIXATION;  Surgeon: Molli Posey, MD;  Location: [facility];  Service: Gynecology;  Laterality: N/A;  . BACK SURGERY    . CATARACT EXTRACTION W/ INTRAOCULAR LENS  IMPLANT, BILATERAL Bilateral   . COLONOSCOPY    . EYE SURGERY    . LAPAROSCOPIC ASSISTED VAGINAL HYSTERECTOMY N/A 01/08/2015   Procedure: LAPAROSCOPIC ASSISTED VAGINAL HYSTERECTOMY;  Surgeon: Molli Posey, MD;  Location: [facility];  Service: Gynecology;  Laterality: N/A;  . LUMBAR FUSION  05/2010  . REVERSE SHOULDER ARTHROPLASTY Left 02/16/2014   Procedure: LEFT REVERSE TOTAL SHOULDER ARTHROPLASTY;  Surgeon: Marin Shutter, MD;  Location: [facility];  Service: Orthopedics;  Laterality: Left;  . REVERSE SHOULDER ARTHROPLASTY Right 08/07/2016  . REVERSE SHOULDER ARTHROPLASTY Right 08/07/2016   Procedure: REVERSE RIGHT SHOULDER ARTHROPLASTY;  Surgeon: Justice Britain, MD;  Location: [facility];  Service: Orthopedics;  Laterality: Right;  . TOE SURGERY    . TUBAL LIGATION      SOCIAL HISTORY: Social History   Socioeconomic History  . Marital status: Widowed    Spouse name: Not on file  .  Number of children: 9  . Years of education: 7  . Highest education level: Not on file  Occupational History  . Not on file  Tobacco Use  . Smoking status: Never Smoker  . Smokeless tobacco: Never Used  Substance and Sexual Activity  . Alcohol use: No  . Drug use: No  . Sexual activity: Never  Other Topics Concern  . Not on file  Social History Narrative   Lives with daughter   Caffeine use: none   Social Determinants of Radio broadcast assistant Strain: Not on file  Food Insecurity: Not on file  Transportation Needs:  Not on file  Physical Activity: Not on file  Stress: Not on file  Social Connections: Not on file  Intimate Partner Violence: Not on file    FAMILY HISTORY: Family History  Problem Relation Age of Onset  . Healthy Daughter     ALLERGIES:  is allergic to alendronate, allopurinol, amoxicillin-pot clavulanate, folic acid, methotrexate derivatives, latex, and tramadol.  MEDICATIONS:  Current Outpatient Medications  Medication Sig Dispense Refill  . ALPRAZolam (XANAX) 0.5 MG tablet One half tablet in am and one half tablet at lunch then one whole tablet hs 60 tablet 1  . aspirin 81 MG tablet Take 81 mg by mouth daily.     Marland Kitchen atorvastatin (LIPITOR) 20 MG tablet TAKE 1 TABLET BY MOUTH  DAILY 90 tablet 3  . clotrimazole-betamethasone (LOTRISONE) cream Apply 1 application topically 2 (two) times daily. 45 g 2  . esomeprazole (NEXIUM) 40 MG capsule TAKE 1 CAPSULE BY MOUTH  DAILY IN THE MORNING 90 capsule 3  . gabapentin (NEURONTIN) 300 MG capsule One or 2 capsules po qhs for leg cramps 60 capsule 3  . hydroxyurea (HYDREA) 500 MG capsule Take 1 cap (500mg ) once daily 5 days a week-Monday through Friday. 2 caps (1000mg ) once daily on saturday and Sunday. May take with food to minimize GI side effects. 30 capsule 2  . lisinopril (ZESTRIL) 5 MG tablet TAKE 1 TABLET BY MOUTH  DAILY 90 tablet 3  . mupirocin ointment (BACTROBAN) 2 % APPLY TO THE AFFECTED AREA TWICE DAILY  AS NEEDED 22 g 11  . NEOMYCIN-POLYMYXIN-HYDROCORTISONE (CORTISPORIN) 1 % SOLN OTIC solution Place 3 drops into the left ear 4 (four) times daily. 10 mL 0  . polyethylene glycol powder (GLYCOLAX/MIRALAX) powder MIX 1 CAPFUL IN 8 OUNCES OF WATER AND DRINK ONCE DAILY AS DIRECTED 527 g 0  . predniSONE (DELTASONE) 5 MG tablet Take 1 tablet (5 mg total) by mouth daily with breakfast. 30 tablet 0  . terconazole (TERAZOL 3) 0.8 % vaginal cream INSERT 1 APPLICATORFUL VAGINALLY AT BEDTIME 20 g 1  . Triamcinolone Acetonide (TRIAMCINOLONE 0.1 % CREAM : EUCERIN) CREA Apply 1 application topically 2 (two) times daily. 1 each 1  . triamcinolone cream (KENALOG) 0.1 % Apply 1 application topically 2 (two) times daily. 30 g 3   No current facility-administered medications for this visit.    REVIEW OF SYSTEMS:   10 Point review of Systems was done is negative except as noted above.   PHYSICAL EXAMINATION: ECOG PERFORMANCE STATUS: 1 - Symptomatic but completely ambulatory   Telehealth Visit.    LABORATORY DATA:  I have reviewed the data as listed  . CBC Latest Ref Rng & Units 06/18/2020 05/02/2020  WBC 3.8 - 10.8 Thousand/uL 7.3 7.1  Hemoglobin 11.7 - 15.5 g/dL 10.6(L) 10.9(L)  Hematocrit 35.0 - 45.0 % 31.4(L) 32.7(L)  Platelets 140 - 400 Thousand/uL 797(H) 826(H)   . CBC    Component Value Date/Time   WBC 6.4 06/22/2020 0907   RBC 2.96 (L) 06/22/2020 0907   HGB 11.2 (L) 06/22/2020 0907   HGB 10.4 (L) 10/01/2015 1558   HCT 32.5 (L) 06/22/2020 0907   HCT 31.0 (L) 10/01/2015 1558   PLT 886 (H) 06/22/2020 0907   PLT 482 (H) 10/01/2015 1558   MCV 109.8 (H) 06/22/2020 0907   MCV 96.3 10/01/2015 1558   MCH 37.8 (H) 06/22/2020 0907   MCHC 34.5 06/22/2020 0907   RDW 17.2 (H) 06/22/2020 0907   RDW 15.6 (H) 10/01/2015 1558  LYMPHSABS 2,067 06/22/2020 0907   LYMPHSABS 2.5 10/01/2015 1558   MONOABS 0.7 06/18/2020 0934   MONOABS 0.7 10/01/2015 1558   EOSABS 58 06/22/2020 0907   EOSABS 0.2  10/01/2015 1558   BASOSABS 19 06/22/2020 0907   BASOSABS 0.0 10/01/2015 1558    . CMP Latest Ref Rng & Units 06/18/2020 05/02/2020  Glucose 65 - 99 mg/dL 82 69(L)  BUN 7 - 25 mg/dL 21 18  Creatinine 0.60 - 0.88 mg/dL 0.97 1.00  Sodium 135 - 146 mmol/L 139 140  Potassium 3.5 - 5.3 mmol/L 3.1(L) 4.0  Chloride 98 - 110 mmol/L 104 105  CO2 20 - 32 mmol/L 26 29  Calcium 8.6 - 10.4 mg/dL 9.3 9.7  Total Protein 6.1 - 8.1 g/dL 7.0 6.9  Total Bilirubin 0.2 - 1.2 mg/dL 0.6 0.4  Alkaline Phos 38 - 126 U/L 58 70  AST 10 - 35 U/L 34 31  ALT 6 - 29 U/L 30 20     RADIOGRAPHIC STUDIES: I have personally reviewed the radiological images as listed and agreed with the findings in the report. No results found.  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  PLAN: -Discussed pt labwork today, 06/18/2020; iron levels good, WBC normalized, counts and chemistries holding stable, Plt down to 790K from 69M.  -Advised pt that there is no need for IV iron at this time. -Will increase Hydroxyurea dose to 1000 mg (two tablets) per day to normalize platelets. No prohibitive toxicities at this time.  -Continue 2000 IU Vitamin D daily. -Refill Hydroxyurea. -Continue Aspirin, B-complex. -Will see back in 6 weeks with labs.   FOLLOW UP: Labs today RTC with Dr Irene Limbo with labs in 2 months    The total time spent in the appointment was 20 minutes and more than 50% was on counseling and direct patient cares.  All of the patient's questions were answered with apparent satisfaction. The patient knows to call the clinic with any problems, questions or concerns.    Sullivan Lone MD [place] AAHIVMS [facility] Hematology/Oncology Physician [facility]  (Office):       [phone] (Work cell):  [phone] (Fax):           [phone]  06/17/2020 8:43 AM  I, Reinaldo Raddle, am acting as scribe for Dr. Sullivan Lone, MD.    .I have reviewed the above documentation for accuracy and completeness, and I agree with the above. Brunetta Genera MD

## 2020-06-18 ENCOUNTER — Inpatient Hospital Stay: Payer: Medicare Other | Admitting: Hematology

## 2020-06-18 ENCOUNTER — Other Ambulatory Visit: Payer: Self-pay

## 2020-06-18 ENCOUNTER — Inpatient Hospital Stay: Payer: Medicare Other

## 2020-06-18 VITALS — BP 166/85 | HR 70 | Temp 97.8°F | Resp 18 | Ht 59.0 in | Wt 143.1 lb

## 2020-06-18 DIAGNOSIS — D473 Essential (hemorrhagic) thrombocythemia: Secondary | ICD-10-CM

## 2020-06-18 DIAGNOSIS — D649 Anemia, unspecified: Secondary | ICD-10-CM | POA: Diagnosis not present

## 2020-06-18 DIAGNOSIS — D509 Iron deficiency anemia, unspecified: Secondary | ICD-10-CM

## 2020-06-18 DIAGNOSIS — Z7982 Long term (current) use of aspirin: Secondary | ICD-10-CM | POA: Diagnosis not present

## 2020-06-18 DIAGNOSIS — D72829 Elevated white blood cell count, unspecified: Secondary | ICD-10-CM | POA: Diagnosis not present

## 2020-06-18 DIAGNOSIS — K219 Gastro-esophageal reflux disease without esophagitis: Secondary | ICD-10-CM | POA: Diagnosis not present

## 2020-06-18 DIAGNOSIS — Z86711 Personal history of pulmonary embolism: Secondary | ICD-10-CM | POA: Diagnosis not present

## 2020-06-18 LAB — CMP (CANCER CENTER ONLY)
ALT: 30 U/L (ref 0–44)
AST: 34 U/L (ref 15–41)
Albumin: 3.8 g/dL (ref 3.5–5.0)
Alkaline Phosphatase: 58 U/L (ref 38–126)
Anion gap: 9 (ref 5–15)
BUN: 21 mg/dL (ref 8–23)
CO2: 26 mmol/L (ref 22–32)
Calcium: 9.3 mg/dL (ref 8.9–10.3)
Chloride: 104 mmol/L (ref 98–111)
Creatinine: 0.97 mg/dL (ref 0.44–1.00)
GFR, Estimated: 59 mL/min — ABNORMAL LOW (ref >60)
Glucose, Bld: 82 mg/dL (ref 70–99)
Potassium: 3.1 mmol/L — ABNORMAL LOW (ref 3.5–5.1)
Sodium: 139 mmol/L (ref 135–145)
Total Bilirubin: 0.6 mg/dL (ref 0.3–1.2)
Total Protein: 7 g/dL (ref 6.5–8.1)

## 2020-06-18 LAB — CBC WITH DIFFERENTIAL/PLATELET
Abs Immature Granulocytes: 0.02 10*3/uL (ref 0.00–0.07)
Basophils Absolute: 0 10*3/uL (ref 0.0–0.1)
Basophils Relative: 0 %
Eosinophils Absolute: 0.1 10*3/uL (ref 0.0–0.5)
Eosinophils Relative: 1 %
HCT: 31.4 % — ABNORMAL LOW (ref 36.0–46.0)
Hemoglobin: 10.6 g/dL — ABNORMAL LOW (ref 12.0–15.0)
Immature Granulocytes: 0 %
Lymphocytes Relative: 23 %
Lymphs Abs: 1.7 10*3/uL (ref 0.7–4.0)
MCH: 37.2 pg — ABNORMAL HIGH (ref 26.0–34.0)
MCHC: 33.8 g/dL (ref 30.0–36.0)
MCV: 110.2 fL — ABNORMAL HIGH (ref 80.0–100.0)
Monocytes Absolute: 0.7 10*3/uL (ref 0.1–1.0)
Monocytes Relative: 9 %
Neutro Abs: 4.8 10*3/uL (ref 1.7–7.7)
Neutrophils Relative %: 67 %
Platelets: 797 10*3/uL — ABNORMAL HIGH (ref 150–400)
RBC: 2.85 MIL/uL — ABNORMAL LOW (ref 3.87–5.11)
RDW: 17.2 % — ABNORMAL HIGH (ref 11.5–15.5)
WBC: 7.3 10*3/uL (ref 4.0–10.5)
nRBC: 0.4 % — ABNORMAL HIGH (ref 0.0–0.2)

## 2020-06-18 LAB — IRON AND TIBC
Iron: 115 ug/dL (ref 41–142)
Saturation Ratios: 42 % (ref 21–57)
TIBC: 273 ug/dL (ref 236–444)
UIBC: 158 ug/dL (ref 120–384)

## 2020-06-18 LAB — RETICULOCYTES
Immature Retic Fract: 38.3 % — ABNORMAL HIGH (ref 2.3–15.9)
RBC.: 2.9 MIL/uL — ABNORMAL LOW (ref 3.87–5.11)
Retic Count, Absolute: 58.6 10*3/uL (ref 19.0–186.0)
Retic Ct Pct: 2 % (ref 0.4–3.1)

## 2020-06-18 LAB — SEDIMENTATION RATE: Sed Rate: 26 mm/hr — ABNORMAL HIGH (ref 0–22)

## 2020-06-18 LAB — FERRITIN: Ferritin: 793 ng/mL — ABNORMAL HIGH (ref 11–307)

## 2020-06-18 MED ORDER — HYDROXYUREA 500 MG PO CAPS: 1000.0000 mg | ORAL_CAPSULE | Freq: Every day | ORAL | 2 refills | Status: DC

## 2020-06-22 ENCOUNTER — Other Ambulatory Visit: Payer: Self-pay

## 2020-06-22 ENCOUNTER — Other Ambulatory Visit: Payer: Medicare Other | Admitting: Internal Medicine

## 2020-06-22 DIAGNOSIS — R7302 Impaired glucose tolerance (oral): Secondary | ICD-10-CM | POA: Diagnosis not present

## 2020-06-22 DIAGNOSIS — E7800 Pure hypercholesterolemia, unspecified: Secondary | ICD-10-CM | POA: Diagnosis not present

## 2020-06-22 DIAGNOSIS — M059 Rheumatoid arthritis with rheumatoid factor, unspecified: Secondary | ICD-10-CM

## 2020-06-22 DIAGNOSIS — R0989 Other specified symptoms and signs involving the circulatory and respiratory systems: Secondary | ICD-10-CM

## 2020-06-22 DIAGNOSIS — D649 Anemia, unspecified: Secondary | ICD-10-CM | POA: Diagnosis not present

## 2020-06-22 DIAGNOSIS — Z0000 Encounter for general adult medical examination without abnormal findings: Secondary | ICD-10-CM

## 2020-06-22 DIAGNOSIS — I10 Essential (primary) hypertension: Secondary | ICD-10-CM | POA: Diagnosis not present

## 2020-06-22 DIAGNOSIS — N183 Chronic kidney disease, stage 3 unspecified: Secondary | ICD-10-CM

## 2020-06-22 DIAGNOSIS — N1831 Chronic kidney disease, stage 3a: Secondary | ICD-10-CM

## 2020-06-23 LAB — COMPLETE METABOLIC PANEL WITH GFR
AG Ratio: 1.7 (calc) (ref 1.0–2.5)
ALT: 20 U/L (ref 6–29)
AST: 23 U/L (ref 10–35)
Albumin: 4 g/dL (ref 3.6–5.1)
Alkaline phosphatase (APISO): 56 U/L (ref 37–153)
BUN/Creatinine Ratio: 21 (calc) (ref 6–22)
BUN: 19 mg/dL (ref 7–25)
CO2: 25 mmol/L (ref 20–32)
Calcium: 9.5 mg/dL (ref 8.6–10.4)
Chloride: 104 mmol/L (ref 98–110)
Creat: 0.91 mg/dL — ABNORMAL HIGH (ref 0.60–0.88)
GFR, Est African American: 69 mL/min/{1.73_m2} (ref >=60)
GFR, Est Non African American: 59 mL/min/{1.73_m2} — ABNORMAL LOW (ref >=60)
Globulin: 2.3 g/dL (calc) (ref 1.9–3.7)
Glucose, Bld: 77 mg/dL (ref 65–99)
Potassium: 4.6 mmol/L (ref 3.5–5.3)
Sodium: 138 mmol/L (ref 135–146)
Total Bilirubin: 0.5 mg/dL (ref 0.2–1.2)
Total Protein: 6.3 g/dL (ref 6.1–8.1)

## 2020-06-23 LAB — LIPID PANEL
Cholesterol: 157 mg/dL (ref <200)
HDL: 50 mg/dL (ref >=50)
LDL Cholesterol (Calc): 91 mg/dL (calc)
Non-HDL Cholesterol (Calc): 107 mg/dL (calc) (ref <130)
Total CHOL/HDL Ratio: 3.1 (calc) (ref <5.0)
Triglycerides: 70 mg/dL (ref <150)

## 2020-06-23 LAB — CBC WITH DIFFERENTIAL/PLATELET
Absolute Monocytes: 806 cells/uL (ref 200–950)
Basophils Absolute: 19 cells/uL (ref 0–200)
Basophils Relative: 0.3 %
Eosinophils Absolute: 58 cells/uL (ref 15–500)
Eosinophils Relative: 0.9 %
HCT: 32.5 % — ABNORMAL LOW (ref 35.0–45.0)
Hemoglobin: 11.2 g/dL — ABNORMAL LOW (ref 11.7–15.5)
Lymphs Abs: 2067 cells/uL (ref 850–3900)
MCH: 37.8 pg — ABNORMAL HIGH (ref 27.0–33.0)
MCHC: 34.5 g/dL (ref 32.0–36.0)
MCV: 109.8 fL — ABNORMAL HIGH (ref 80.0–100.0)
MPV: 8.9 fL (ref 7.5–12.5)
Monocytes Relative: 12.6 %
Neutro Abs: 3450 cells/uL (ref 1500–7800)
Neutrophils Relative %: 53.9 %
Platelets: 886 10*3/uL — ABNORMAL HIGH (ref 140–400)
RBC: 2.96 10*6/uL — ABNORMAL LOW (ref 3.80–5.10)
RDW: 17.2 % — ABNORMAL HIGH (ref 11.0–15.0)
Total Lymphocyte: 32.3 %
WBC: 6.4 10*3/uL (ref 3.8–10.8)

## 2020-06-23 LAB — HEMOGLOBIN A1C
Hgb A1c MFr Bld: 5.9 % of total Hgb — ABNORMAL HIGH (ref <5.7)
Mean Plasma Glucose: 123 mg/dL
eAG (mmol/L): 6.8 mmol/L

## 2020-06-23 LAB — TSH: TSH: 2.59 mIU/L (ref 0.40–4.50)

## 2020-06-26 ENCOUNTER — Ambulatory Visit (INDEPENDENT_AMBULATORY_CARE_PROVIDER_SITE_OTHER): Payer: Medicare Other | Admitting: Internal Medicine

## 2020-06-26 ENCOUNTER — Encounter: Payer: Self-pay | Admitting: Internal Medicine

## 2020-06-26 ENCOUNTER — Other Ambulatory Visit: Payer: Self-pay

## 2020-06-26 VITALS — BP 120/80 | HR 70 | Ht 59.5 in | Wt 143.0 lb

## 2020-06-26 DIAGNOSIS — G4762 Sleep related leg cramps: Secondary | ICD-10-CM

## 2020-06-26 DIAGNOSIS — E7800 Pure hypercholesterolemia, unspecified: Secondary | ICD-10-CM

## 2020-06-26 DIAGNOSIS — Z862 Personal history of diseases of the blood and blood-forming organs and certain disorders involving the immune mechanism: Secondary | ICD-10-CM

## 2020-06-26 DIAGNOSIS — D473 Essential (hemorrhagic) thrombocythemia: Secondary | ICD-10-CM | POA: Diagnosis not present

## 2020-06-26 DIAGNOSIS — N1831 Chronic kidney disease, stage 3a: Secondary | ICD-10-CM | POA: Diagnosis not present

## 2020-06-26 DIAGNOSIS — Z8659 Personal history of other mental and behavioral disorders: Secondary | ICD-10-CM

## 2020-06-26 DIAGNOSIS — R7302 Impaired glucose tolerance (oral): Secondary | ICD-10-CM

## 2020-06-26 DIAGNOSIS — R0989 Other specified symptoms and signs involving the circulatory and respiratory systems: Secondary | ICD-10-CM

## 2020-06-26 DIAGNOSIS — I10 Essential (primary) hypertension: Secondary | ICD-10-CM

## 2020-06-26 DIAGNOSIS — M059 Rheumatoid arthritis with rheumatoid factor, unspecified: Secondary | ICD-10-CM | POA: Diagnosis not present

## 2020-06-26 DIAGNOSIS — Z0000 Encounter for general adult medical examination without abnormal findings: Secondary | ICD-10-CM | POA: Diagnosis not present

## 2020-06-26 DIAGNOSIS — D7589 Other specified diseases of blood and blood-forming organs: Secondary | ICD-10-CM | POA: Diagnosis not present

## 2020-06-26 LAB — POCT URINALYSIS DIPSTICK
Appearance: NEGATIVE
Bilirubin, UA: NEGATIVE
Blood, UA: NEGATIVE
Glucose, UA: NEGATIVE
Ketones, UA: NEGATIVE
Leukocytes, UA: NEGATIVE
Nitrite, UA: NEGATIVE
Odor: NEGATIVE
Protein, UA: NEGATIVE
Spec Grav, UA: 1.01 (ref 1.010–1.025)
Urobilinogen, UA: 0.2 E.U./dL
pH, UA: 6.5 (ref 5.0–8.0)

## 2020-06-26 NOTE — Progress Notes (Signed)
Subjective:    Patient ID: [name], female    DOB: 1941/01/30, 80 y.o.   MRN: [id number]  HPI 80 year old Female for health maintenance exam and evaluation of medical issues.  History of essential thrombocytosis.  Followed by Dr. Irene Limbo for this as well as anemia.  He last saw her January 24 and her hemoglobin was 10.6 g with MCV 110.2 platelet count 797,000.  Iron level and total iron-binding capacity were both normal.  Ferritin was 793.  Sed rate was 26.  She continues to reside with her daughter.  She is on Hydrea 500 mg daily 5 days a week Monday through Friday and 2 capsules totaling 1000 mg once daily on Saturday and Sunday.  Remains on Lipitor for hyperlipidemia, lisinopril for mild hypertension, Nexium for GE reflux and Xanax for anxiety.  Also on prednisone 5 mg daily.  She has a history of rheumatoid arthritis.  Is on gabapentin at night for leg cramps.  Lipids are entirely normal on current dose of statin.  Hemoglobin is stable at 11.2 g.  Currently the MCV on January 20 eight 189.8.  Platelet count 886,000.  TSH is normal.  Glucose is normal.  BUN is normal and creatinine is very slightly elevated at 0.91.  Electrolytes and liver functions are normal.  Rheumatologist is Dr. Dossie Der.  Patient has a history of impaired glucose tolerance, hypertension, nocturnal leg cramps, epigastric pain treated with PPI, chronic kidney disease, hyperlipidemia, rheumatoid arthritis and iron deficiency anemia.  History of glaucoma and has annual eye exam with Dr. Marshall Cork.  History of abdominal bloating and constipation treated with MiraLAX.  Had colonoscopy in 2014 by Dr. Watt Climes showing chronic diverticulosis.  History of esophageal stricture and gastritis on endoscopy.  Had repeat endoscopy by Dr. Watt Climes February 2017 showing small hiatal hernia and chronic gastritis which was biopsied.  Multiple gastric polyps were biopsied.  Pathology was negative for H. pylori.  She underwent right  S1-S2 laminectomy with decompressive lumbar laminectomy L4-L5 and L5-S1 as well as S 1-S2 in January 2012 by Dr. Sherley Bounds.  Postoperatively she had pulmonary embolus.  After that, she had a viral syndrome and was hospitalized in February 2012.  T7 compression fracture 2000.  History of abnormal Pap smear 1995 with colposcopy.  Bilateral tubal ligation 1979.  D&C for menorrhagia in 1999.  Marsupialization of Bartholin cyst 1997.  History of H. pylori gastritis July 2000.  History of degenerative arthritis left thumb CMC joint  History of left wrist de Quervain's tendinitis.  Social history: She is retired.  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  Does not smoke or consume alcohol.  She is a widow.  Family history: Mother died of an MI with history of hypertension who was 82 years of age at the time of her event.  1 brother with history of adult onset diabetes, 5 sisters-one of them has diabetes.  1 daughter died of an occult GI malignancy.  1 brother in good health.  She has 9 children.  1 daughter with history of pulmonary sarcoidosis years ago that resolved.  Patient had Cologuard test that was negative in 2020.  Daughter with whom patient resides had Covid recently but patient never got ill.  Has had 3 COVID-19 immunizations.  Had flu vaccine in October 2021.  Tetanus immunization is up-to-date.  Has  one Shingrix vaccine on file in 2019.  Not sure if she got  second 1.  Will need to check with pharmacy.  Review of Systems no new complaints     Objective:   Physical Exam Blood pressure 120/80 pulse 70 regular pulse oximetry 99% weight 143 pounds BMI 28.40  Skin is warm and dry.  No cervical adenopathy.  No thyromegaly.  No carotid bruits.  TMs clear.  Neck supple.  Chest is clear to auscultation.  Bilateral carotid bruits breast without masses.  Cardiac exam regular rate and rhythm.  Abdomen soft nondistended without  hepatosplenomegaly masses or tenderness.  No lower extremity pitting edema.  Neuro is intact without focal deficits.  Affect is slightly anxious.       Assessment & Plan:  Rheumatoid arthritis-patient sees Dr. Dossie Der and this appears to be stable.  Currently on no medications for this.  Essential thrombocytosis seen by Dr. Rod Can and is on Hydrea and stable at one point platelet count got up to 900,000 but it has improved considerably with his treatment  Essential hypertension treated with low-dose Zestril  Hyperlipidemia treated with Lipitor  GE reflux treated with Nexium  History of anxiety treated with Xanax  History of iron deficiency  Leg cramps treated with gabapentin  History of bilateral carotid bruits with carotid ultrasound showing 1 to 39% stenosis in each carotid in 2019.  Plan: Continue current medications and follow-up in 6 months.  Subjective:   Patient presents for Medicare Annual/Subsequent preventive examination.  Review Past Medical/Family/Social: See above-family supportive   Risk Factors  Current exercise habits: Mostly sedentary Dietary issues discussed: Low-fat low carbohydrate  Cardiac risk factors: Hyperlipidemia, impaired glucose tolerance, family history in mother  Depression Screen  (Note: if answer to either of the following is "Yes", a more complete depression screening is indicated)   Over the past two weeks, have you felt down, depressed or hopeless? No  Over the past two weeks, have you felt little interest or pleasure in doing things? No Have you lost interest or pleasure in daily life? No Do you often feel hopeless? No Do you cry easily over simple problems? No   Activities of Daily Living  In your present state of health, do you have any difficulty performing the following activities?:   Driving? Not driving much Managing money? No  Feeding yourself? No  Getting from bed to chair? No  Climbing a flight of stairs? No  Preparing  food and eating?: No  Bathing or showering? No  Getting dressed: No  Getting to the toilet? No  Using the toilet:No  Moving around from place to place: No  In the past year have you fallen or had a near fall?:No  Are you sexually active? No  Do you have more than one partner? No   Hearing Difficulties: No  Do you often ask people to speak up or repeat themselves? No  Do you experience ringing or noises in your ears? No  Do you have difficulty understanding soft or whispered voices? No  Do you feel that you have a problem with memory? No Do you often misplace items? No    Home Safety:  Do you have a smoke alarm at your residence? Yes Do you have grab bars in the bathroom? no Do you have throw rugs in your house?no   Cognitive Testing  Alert? Yes Normal Appearance?Yes  Oriented to person? Yes Place? Yes  Time? Yes  Recall of three objects? Yes  Can perform simple calculations? Yes  Displays appropriate judgment?Yes  Can read the correct time from  a watch face?Yes   List the Names of Other Physician/Practitioners you currently use:  See referral list for the physicians patient is currently seeing.  Hematology  Rheumatology      Review of Systems: See above   Objective:     General appearance: Appears stated age  Head: Normocephalic, without obvious abnormality, atraumatic  Eyes: conj clear, EOMi PEERLA  Ears: normal TM's and external ear canals both ears  Nose: Nares normal. Septum midline. Mucosa normal. No drainage or sinus tenderness.  Throat: lips, mucosa, and tongue normal; teeth and gums normal  Neck: no adenopathy, no carotid bruit, no JVD, supple, symmetrical, trachea midline and thyroid not enlarged, symmetric, no tenderness/mass/nodules  No CVA tenderness.  Lungs: clear to auscultation bilaterally  Breasts: normal appearance, no masses or tenderness Heart: regular rate and rhythm, S1, S2 normal, no murmur, click, rub or gallop  Abdomen: soft,  non-tender; bowel sounds normal; no masses, no organomegaly  Musculoskeletal: ROM normal in all joints, no crepitus, no deformity, Normal muscle strengthen. Back  is symmetric, no curvature. Skin: Skin color, texture, turgor normal. No rashes or lesions  Lymph nodes: Cervical, supraclavicular, and axillary nodes normal.  Neurologic: CN 2 -12 Normal, Normal symmetric reflexes. Normal coordination and gait  Psych: Alert & Oriented x 3, Mood appear stable.    Assessment:    Annual wellness medicare exam   Plan:    During the course of the visit the patient was educated and counseled about appropriate screening and preventive services including:   Immunizations appear to be up-to-date.  Need to check on second Shingrix vaccine     Patient Instructions (the written plan) was given to the patient.  Medicare Attestation  I have personally reviewed:  The patient's medical and social history  Their use of alcohol, tobacco or illicit drugs  Their current medications and supplements  The patient's functional ability including ADLs,fall risks, home safety risks, cognitive, and hearing and visual impairment  Diet and physical activities  Evidence for depression or mood disorders  The patient's weight, height, BMI, and visual acuity have been recorded in the chart. I have made referrals, counseling, and provided education to the patient based on review of the above and I have provided the patient with a written personalized care plan for preventive services.

## 2020-06-26 NOTE — Patient Instructions (Addendum)
It was a pleasure to see you today.  Continue current medications and follow-up in 6 months. 

## 2020-06-29 ENCOUNTER — Telehealth: Payer: Self-pay | Admitting: *Deleted

## 2020-06-29 NOTE — Telephone Encounter (Signed)
Patient called. Asked if she could take Prilosec OTC for "burning in stomach" ? Dr.Kale informed of question. Contacted patient with Dr. Grier Mitts response - ok to take OTC Prilosec as directed on package - one per day. Patient verbalized understanding

## 2020-08-10 DIAGNOSIS — M810 Age-related osteoporosis without current pathological fracture: Secondary | ICD-10-CM | POA: Diagnosis not present

## 2020-08-10 DIAGNOSIS — M059 Rheumatoid arthritis with rheumatoid factor, unspecified: Secondary | ICD-10-CM | POA: Diagnosis not present

## 2020-08-10 DIAGNOSIS — I10 Essential (primary) hypertension: Secondary | ICD-10-CM | POA: Diagnosis not present

## 2020-08-10 DIAGNOSIS — Z79899 Other long term (current) drug therapy: Secondary | ICD-10-CM | POA: Diagnosis not present

## 2020-08-10 DIAGNOSIS — G5600 Carpal tunnel syndrome, unspecified upper limb: Secondary | ICD-10-CM | POA: Diagnosis not present

## 2020-08-10 DIAGNOSIS — E790 Hyperuricemia without signs of inflammatory arthritis and tophaceous disease: Secondary | ICD-10-CM | POA: Diagnosis not present

## 2020-08-10 DIAGNOSIS — D473 Essential (hemorrhagic) thrombocythemia: Secondary | ICD-10-CM | POA: Diagnosis not present

## 2020-08-10 DIAGNOSIS — M1990 Unspecified osteoarthritis, unspecified site: Secondary | ICD-10-CM | POA: Diagnosis not present

## 2020-08-12 NOTE — Progress Notes (Incomplete)
HEMATOLOGY/ONCOLOGY CLINIC NOTE  Date of Service: 08/12/2020  Patient Care Team: [name], MD as PCP - Tanya Homans, MD as Consulting Physician (Gastroenterology) Tanya Party, MD (Rheumatology)  CHIEF COMPLAINTS/PURPOSE OF CONSULTATION:  Mx of essential thrombocytopenia.  HISTORY OF PRESENTING ILLNESS:   [name] is a wonderful 80 y.o. female who has been referred to [place] by Dr. Renold Genta for evaluation and management of thrombocytosis and anemia. Pt is accompanied today by her daughter. The pt reports that she is doing well overall.   The pt reports that she was diagnosed with Rheumatoid Arthritis three years ago. Pt is currently being seen by Dr. Dossie Der for her RA. Pt was using IV medication for a year, likely Methotrexate, to treat her RA before she began having an allergic reaction and was stopped. She was on Prednisone to treat for two weeks and was stopped a week ago due to rash, which as resolved. She also receives Cortizone knee injections periodically, which help for a short time. Pt reports that her joint stiffness improves within 30 minutes of waking up and beginning to move about. Pt has a follow up scheduled with Dr. Dossie Der in the beginning of December.  Pt had pulmonary embolism in 2012 that was thought to be caused by back surgery. Pt was treated with Coumadin for 6 months following her PE. She denies any heart attacks or strokes. Pt is currently taking three 81 mg Aspirin daily. Pt has IBS and complains of occasional abdominal discomfort. Pt has carpal tunnel and has received local injections, but surgery is not a current consideration. She has no family history of blood conditions or disorders. She is allergic to Allopurinol. She has received her COVID19 vaccines and plans on receiving the booster.   Most recent lab results (01/20/2020) of CBC and CMP is as follows: all values are WNL except for WBC at 12.7K, RBC at 3.02, Hgb at 10.0, HCT at 29.9, MCH at 33.1,  PLT at 1208K, Neutro Abs at 9309, Mono Abs at 1346, Eos Abs at 0, Glucose at 111, Creatinine at 1.15, GFR Est Af Am at 52, Sodium at 129, Chloride at 95. 01/19/2020 Sed Rate at 98  On review of systems, pt reports fingertip numbness and denies vision changes, SOB, leg swelling, fingertip discoloration, unexpected weight loss, fevers, chills, night sweats and any other symptoms.   On PMHx the pt reports Lumbar fusion, Rheumatoid Arthritis, Cervical Stenosis, IBS, PE, Carpal Tunnel. On Social Hx the pt reports that she is a non-smoker, but had significant second-hand exposure previously.   INTERVAL HISTORY: [name] is a wonderful 80 y.o. female who is here for evaluation and management of Essential thrombocytosis and anemia. The patient's last visit with [place] was on 06/18/2020. The pt reports that she is doing well overall.  The pt reports ***  Lab results today 08/13/2020 of CBC w/diff and CMP is as follows: all values are WNL except for ***  On review of systems, pt reports *** and denies *** and any other symptoms.   MEDICAL HISTORY:  Past Medical History:  Diagnosis Date  . Arthritis   . Cervical stenosis of spine   . Dysphasia   . Essential hypertension, benign   . GERD (gastroesophageal reflux disease)   . Glaucoma   . Heart murmur   . History of estrogen therapy   . Hypercholesteremia   . IBS (irritable bowel syndrome)   . Mitral regurgitation   . Pulmonary air embolism (  [place])    states she had this wiht her back surgery  . Rheumatoid arthritis ([place])   . Seborrhea    patient denies    SURGICAL HISTORY: Past Surgical History:  Procedure Laterality Date  . ANTERIOR AND POSTERIOR REPAIR WITH SACROSPINOUS FIXATION N/A 01/08/2015   Procedure: ANTERIOR AND POSTERIOR REPAIR WITH possible SACROSPINOUS FIXATION;  Surgeon: Molli Posey, MD;  Location: [facility];  Service: Gynecology;  Laterality: N/A;  . BACK SURGERY    . CATARACT EXTRACTION W/ INTRAOCULAR LENS  IMPLANT,  BILATERAL Bilateral   . COLONOSCOPY    . EYE SURGERY    . LAPAROSCOPIC ASSISTED VAGINAL HYSTERECTOMY N/A 01/08/2015   Procedure: LAPAROSCOPIC ASSISTED VAGINAL HYSTERECTOMY;  Surgeon: Molli Posey, MD;  Location: [facility];  Service: Gynecology;  Laterality: N/A;  . LUMBAR FUSION  05/2010  . REVERSE SHOULDER ARTHROPLASTY Left 02/16/2014   Procedure: LEFT REVERSE TOTAL SHOULDER ARTHROPLASTY;  Surgeon: Marin Shutter, MD;  Location: [facility];  Service: Orthopedics;  Laterality: Left;  . REVERSE SHOULDER ARTHROPLASTY Right 08/07/2016  . REVERSE SHOULDER ARTHROPLASTY Right 08/07/2016   Procedure: REVERSE RIGHT SHOULDER ARTHROPLASTY;  Surgeon: Justice Britain, MD;  Location: [facility];  Service: Orthopedics;  Laterality: Right;  . TOE SURGERY    . TUBAL LIGATION      SOCIAL HISTORY: Social History   Socioeconomic History  . Marital status: Widowed    Spouse name: Not on file  . Number of children: 9  . Years of education: 7  . Highest education level: Not on file  Occupational History  . Not on file  Tobacco Use  . Smoking status: Never Smoker  . Smokeless tobacco: Never Used  Substance and Sexual Activity  . Alcohol use: No  . Drug use: No  . Sexual activity: Never  Other Topics Concern  . Not on file  Social History Narrative   Lives with daughter   Caffeine use: none   Social Determinants of Radio broadcast assistant Strain: Not on file  Food Insecurity: Not on file  Transportation Needs: Not on file  Physical Activity: Not on file  Stress: Not on file  Social Connections: Not on file  Intimate Partner Violence: Not on file    FAMILY HISTORY: Family History  Problem Relation Age of Onset  . Healthy Daughter     ALLERGIES:  is allergic to alendronate, allopurinol, amoxicillin-pot clavulanate, folic acid, methotrexate derivatives, latex, and tramadol.  MEDICATIONS:  Current Outpatient Medications  Medication Sig Dispense Refill  . ALPRAZolam (XANAX) 0.5 MG tablet One half  tablet in am and one half tablet at lunch then one whole tablet hs 60 tablet 1  . aspirin 81 MG tablet Take 81 mg by mouth daily.    Marland Kitchen atorvastatin (LIPITOR) 20 MG tablet TAKE 1 TABLET BY MOUTH  DAILY 90 tablet 3  . clotrimazole-betamethasone (LOTRISONE) cream Apply 1 application topically 2 (two) times daily. 45 g 2  . esomeprazole (NEXIUM) 40 MG capsule TAKE 1 CAPSULE BY MOUTH  DAILY IN THE MORNING 90 capsule 3  . gabapentin (NEURONTIN) 300 MG capsule One or 2 capsules po qhs for leg cramps 60 capsule 3  . hydroxyurea (HYDREA) 500 MG capsule Take 2 capsules (1,000 mg total) by mouth daily. May take with food to minimize GI side effects. 60 capsule 2  . lisinopril (ZESTRIL) 5 MG tablet TAKE 1 TABLET BY MOUTH  DAILY 90 tablet 3  . mupirocin ointment (BACTROBAN) 2 % APPLY TO THE AFFECTED AREA TWICE DAILY  AS NEEDED 22 g 11  . NEOMYCIN-POLYMYXIN-HYDROCORTISONE (CORTISPORIN) 1 % SOLN OTIC solution Place 3 drops into the left ear 4 (four) times daily. 10 mL 0  . polyethylene glycol powder (GLYCOLAX/MIRALAX) powder MIX 1 CAPFUL IN 8 OUNCES OF WATER AND DRINK ONCE DAILY AS DIRECTED 527 g 0  . terconazole (TERAZOL 3) 0.8 % vaginal cream INSERT 1 APPLICATORFUL VAGINALLY AT BEDTIME 20 g 1  . Triamcinolone Acetonide (TRIAMCINOLONE 0.1 % CREAM : EUCERIN) CREA Apply 1 application topically 2 (two) times daily. 1 each 1  . triamcinolone cream (KENALOG) 0.1 % Apply 1 application topically 2 (two) times daily. 30 g 3   No current facility-administered medications for this visit.    REVIEW OF SYSTEMS:   10 Point review of Systems was done is negative except as noted above.   PHYSICAL EXAMINATION: ECOG PERFORMANCE STATUS: 1 - Symptomatic but completely ambulatory   *** GENERAL:alert, in no acute distress and comfortable SKIN: no acute rashes, no significant lesions EYES: conjunctiva are pink and non-injected, sclera anicteric OROPHARYNX: MMM, no exudates, no oropharyngeal erythema or ulceration NECK:  supple, no JVD LYMPH:  no palpable lymphadenopathy in the cervical, axillary or inguinal regions LUNGS: clear to auscultation b/l with normal respiratory effort HEART: regular rate & rhythm ABDOMEN:  normoactive bowel sounds , non tender, not distended. Extremity: no pedal edema PSYCH: alert & oriented x 3 with fluent speech NEURO: no focal motor/sensory deficits   LABORATORY DATA:  I have reviewed the data as listed  . CBC Latest Ref Rng & Units 06/18/2020 05/02/2020  WBC 3.8 - 10.8 Thousand/uL 7.3 7.1  Hemoglobin 11.7 - 15.5 g/dL 10.6(L) 10.9(L)  Hematocrit 35.0 - 45.0 % 31.4(L) 32.7(L)  Platelets 140 - 400 Thousand/uL 797(H) 826(H)   . CBC    Component Value Date/Time   WBC 6.4 06/22/2020 0907   RBC 2.96 (L) 06/22/2020 0907   HGB 11.2 (L) 06/22/2020 0907   HGB 10.4 (L) 10/01/2015 1558   HCT 32.5 (L) 06/22/2020 0907   HCT 31.0 (L) 10/01/2015 1558   PLT 886 (H) 06/22/2020 0907   PLT 482 (H) 10/01/2015 1558   MCV 109.8 (H) 06/22/2020 0907   MCV 96.3 10/01/2015 1558   MCH 37.8 (H) 06/22/2020 0907   MCHC 34.5 06/22/2020 0907   RDW 17.2 (H) 06/22/2020 0907   RDW 15.6 (H) 10/01/2015 1558   LYMPHSABS 2,067 06/22/2020 0907   LYMPHSABS 2.5 10/01/2015 1558   MONOABS 0.7 06/18/2020 0934   MONOABS 0.7 10/01/2015 1558   EOSABS 58 06/22/2020 0907   EOSABS 0.2 10/01/2015 1558   BASOSABS 19 06/22/2020 0907   BASOSABS 0.0 10/01/2015 1558    . CMP Latest Ref Rng & Units 06/18/2020 05/02/2020  Glucose 65 - 99 mg/dL 82 69(L)  BUN 7 - 25 mg/dL 21 18  Creatinine 0.60 - 0.88 mg/dL 0.97 1.00  Sodium 135 - 146 mmol/L 139 140  Potassium 3.5 - 5.3 mmol/L 3.1(L) 4.0  Chloride 98 - 110 mmol/L 104 105  CO2 20 - 32 mmol/L 26 29  Calcium 8.6 - 10.4 mg/dL 9.3 9.7  Total Protein 6.1 - 8.1 g/dL 7.0 6.9  Total Bilirubin 0.2 - 1.2 mg/dL 0.6 0.4  Alkaline Phos 38 - 126 U/L 58 70  AST 10 - 35 U/L 34 31  ALT 6 - 29 U/L 30 20     RADIOGRAPHIC STUDIES: I have personally reviewed the  radiological images as listed and agreed with the findings in the report. No results found.  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  PLAN: -Discussed pt labwork today, 08/13/2020; ***  -Will increase Hydroxyurea dose to 1000 mg (two tablets) per day to normalize platelets. No prohibitive toxicities at this time.  -Continue 2000 IU Vitamin D daily. -Continue Aspirin, B-complex. -Will see back in ***   FOLLOW UP: ***   The total time spent in the appointment was *** minutes and more than 50% was on counseling and direct patient cares.   All of the patient's questions were answered with apparent satisfaction. The patient knows to call the clinic with any problems, questions or concerns.    Sullivan Lone MD August AAHIVMS [facility] Hematology/Oncology Physician [facility]  (Office):       [phone] (Work cell):  [phone] (Fax):           [phone]  08/12/2020 10:03 AM  I, Reinaldo Raddle, am acting as scribe for Dr. Sullivan Lone, MD.

## 2020-08-13 ENCOUNTER — Inpatient Hospital Stay: Payer: Medicare Other | Admitting: Hematology

## 2020-08-13 ENCOUNTER — Inpatient Hospital Stay: Payer: Medicare Other | Attending: Hematology

## 2020-12-06 ENCOUNTER — Other Ambulatory Visit: Payer: Self-pay | Admitting: Internal Medicine

## 2020-12-20 ENCOUNTER — Other Ambulatory Visit: Payer: Medicare Other | Admitting: Internal Medicine

## 2020-12-20 ENCOUNTER — Other Ambulatory Visit: Payer: Self-pay

## 2020-12-20 DIAGNOSIS — E7800 Pure hypercholesterolemia, unspecified: Secondary | ICD-10-CM | POA: Diagnosis not present

## 2020-12-20 DIAGNOSIS — N1831 Chronic kidney disease, stage 3a: Secondary | ICD-10-CM

## 2020-12-20 DIAGNOSIS — I10 Essential (primary) hypertension: Secondary | ICD-10-CM | POA: Diagnosis not present

## 2020-12-20 DIAGNOSIS — R7302 Impaired glucose tolerance (oral): Secondary | ICD-10-CM | POA: Diagnosis not present

## 2020-12-21 LAB — LIPID PANEL
Cholesterol: 198 mg/dL (ref <200)
HDL: 59 mg/dL (ref >=50)
LDL Cholesterol (Calc): 121 mg/dL (calc) — ABNORMAL HIGH
Non-HDL Cholesterol (Calc): 139 mg/dL (calc) — ABNORMAL HIGH (ref <130)
Total CHOL/HDL Ratio: 3.4 (calc) (ref <5.0)
Triglycerides: 84 mg/dL (ref <150)

## 2020-12-21 LAB — HEPATIC FUNCTION PANEL
AG Ratio: 1.4 (calc) (ref 1.0–2.5)
ALT: 70 U/L — ABNORMAL HIGH (ref 6–29)
AST: 41 U/L — ABNORMAL HIGH (ref 10–35)
Albumin: 4.2 g/dL (ref 3.6–5.1)
Alkaline phosphatase (APISO): 68 U/L (ref 37–153)
Bilirubin, Direct: 0.1 mg/dL (ref 0.0–0.2)
Globulin: 2.9 g/dL (calc) (ref 1.9–3.7)
Indirect Bilirubin: 0.5 mg/dL (calc) (ref 0.2–1.2)
Total Bilirubin: 0.6 mg/dL (ref 0.2–1.2)
Total Protein: 7.1 g/dL (ref 6.1–8.1)

## 2020-12-21 LAB — HEMOGLOBIN A1C
Hgb A1c MFr Bld: 6 % of total Hgb — ABNORMAL HIGH (ref <5.7)
Mean Plasma Glucose: 126 mg/dL
eAG (mmol/L): 7 mmol/L

## 2020-12-25 ENCOUNTER — Ambulatory Visit (INDEPENDENT_AMBULATORY_CARE_PROVIDER_SITE_OTHER): Payer: Medicare Other | Admitting: Internal Medicine

## 2020-12-25 ENCOUNTER — Telehealth: Payer: Self-pay | Admitting: Internal Medicine

## 2020-12-25 ENCOUNTER — Encounter: Payer: Self-pay | Admitting: Internal Medicine

## 2020-12-25 ENCOUNTER — Other Ambulatory Visit: Payer: Self-pay

## 2020-12-25 VITALS — BP 140/90 | HR 78 | Ht 59.5 in | Wt 138.0 lb

## 2020-12-25 DIAGNOSIS — D75839 Thrombocytosis, unspecified: Secondary | ICD-10-CM

## 2020-12-25 LAB — CBC WITH DIFFERENTIAL/PLATELET
Absolute Monocytes: 1064 cells/uL — ABNORMAL HIGH (ref 200–950)
Basophils Absolute: 40 cells/uL (ref 0–200)
Basophils Relative: 0.3 %
Eosinophils Absolute: 53 cells/uL (ref 15–500)
Eosinophils Relative: 0.4 %
HCT: 35.3 % (ref 35.0–45.0)
Hemoglobin: 11.9 g/dL (ref 11.7–15.5)
Lymphs Abs: 2793 cells/uL (ref 850–3900)
MCH: 35.5 pg — ABNORMAL HIGH (ref 27.0–33.0)
MCHC: 33.7 g/dL (ref 32.0–36.0)
MCV: 105.4 fL — ABNORMAL HIGH (ref 80.0–100.0)
MPV: 8.8 fL (ref 7.5–12.5)
Monocytes Relative: 8 %
Neutro Abs: 9350 cells/uL — ABNORMAL HIGH (ref 1500–7800)
Neutrophils Relative %: 70.3 %
Platelets: 1292 10*3/uL — ABNORMAL HIGH (ref 140–400)
RBC: 3.35 10*6/uL — ABNORMAL LOW (ref 3.80–5.10)
RDW: 12.1 % (ref 11.0–15.0)
Total Lymphocyte: 21 %
WBC: 13.3 10*3/uL — ABNORMAL HIGH (ref 3.8–10.8)

## 2020-12-25 MED ORDER — NEOMYCIN-POLYMYXIN-HC 1 % OT SOLN: 3.0000 [drp] | Freq: Four times a day (QID) | OTIC | 0 refills | Status: DC

## 2020-12-25 NOTE — Telephone Encounter (Signed)
Left Voice Message for nurse to call back when she has time. [name] was in our office. We discovered she had No Show office visit with Dr Irene Limbo in March, wanted to see if we could get her back on schedule. She also stated she was not taking Hydrea anymore, not sure if she was to stop taking this or if she stopped on her on. She has new phone number now has been updated in system.

## 2020-12-25 NOTE — Patient Instructions (Addendum)
Referral back to the [facility] regarding thrombocytosis.  Health maintenance exam and Medicare wellness visit due here in 6 months.  Has follow-up with Dr. Juluis Pitch, rheumatologist in September.  There is a question of putting her on chronic low-dose prednisone as she is not tolerant of other medications.  Would like for Dr. Barron Alvine to weigh in on that since he is treating her for essential thrombocytosis.  She will continue with statin medication for hyperlipidemia and continue to watch her diet for impaired glucose tolerance.  GE reflux treated with Nexium.

## 2020-12-25 NOTE — Telephone Encounter (Signed)
Faxed office notes from today's visit to Dr Sabino Niemann [phone], phone [phone]

## 2020-12-25 NOTE — Progress Notes (Signed)
Subjective:    Patient ID: [name], female    DOB: 08-27-1940, 80 y.o.   MRN: [id number]  HPI  80 year old Female here for follow up on multiple issues. Had CPE and Medicare wellness visit in February so this is 6 month recheck appt.   She says she started on Prednisone in mid July by Dr. Dossie Der, Rheumatologist ,for for hand, neck and wrist arthralgias with history of Rheumatoid arthritis.Now down to 5 mg prednisone daily and patient says Dr. Dossie Der wants Dr. Irene Limbo to weigh in if this is OK  to take chronically as I understand it.  Patient saw Dr Dossie Der in March and will see her again in September.   Patient is followed by Dr. Irene Limbo for thrombocytosis and anemia. Was last  seen in January. Patient thinks she was to have another appt recently. Epic records indicate she missed follow up with Dr. Irene Limbo. We will contact his office for appt. We will draw CBC with diff today.  She Is not taking Hydrea at the present time as  she says refills ran out.  In January, Dr. Irene Limbo felt no need for more IV iron. Platelet count down from 1 million to 886,00 in January.   Regarding Serpositive Rheumatoid arthritis, she has declined further DMARD therapy. Intolerant of methotrexate.Just finished pred taper.Perhaps ? maybe could take 5 mg prednisone daily for Rheumatoid arthritis.  However has hx of glaucoma so taking chronic prednisone might not be good for intraocular pressures.  Dr. Marshall Cork is her eye physician.  She has a history of impaired glucose tolerance.  Hemoglobin A1c stable at 6%.  This is treated with diet alone.  History of hyperlipidemia.  LDL cholesterol has increased from 91 in January to 121.  She is on atorvastatin 20 mg daily.  History of GE reflux treated with Nexium.  Takes MiraLAX for constipation.  Remote history of chronic kidney disease but in January 2022 creatinine was barely elevated at 0.91.  No longer taking lisinopril for hypertension.  Blood pressure slightly  elevated today.  No longer taking Xanax for anxiety.  There is a remote history of lumbar laminectomy in 2012 by Dr. Sherley Bounds.  Postoperatively she had pulmonary embolus.  After that she had viral syndrome and was hospitalized in February 2012.  History of T7 compression fracture in 2000.  She has not had COVID-19 but her daughter with whom she resides and it.  She has had 3 COVID vaccines and needs to get booster.  Review of Systems see above     Objective:   Physical Exam BP 140/90- she stopped BP med . Says her weight is stable. BMI 27.41 Skin warm and dry.  Neck is supple without thyromegaly.  Chest clear to auscultation.  Cardiac exam regular rate and rhythm.  No lower extremity pitting edema.  Affect thought and judgment appear to be normal.      Assessment & Plan:   Essential hypertension-currently not taking antihypertensive medication and blood pressure is mildly elevated.  Needs to be monitored.  Essential thrombocytosis-we will contact [facility] about rescheduling appointment she missed earlier this year.  Needs follow-up.  CBC with differential drawn today.  Impaired glucose tolerance hemoglobin A1c stable at 6% with diet alone  Hyperlipidemia-LDL elevated at 121 otherwise lipid panel is normal.  Records indicate she is on generic Lipitor 20 mg daily.  History of GE reflux treated with Nexium.  Rheumatoid arthritis treated with a short course of prednisone recently by  Dr. Otho Ket, rheumatologist.  Patient says Dr. Dossie Der wonders if she could take prednisone chronically at a lower dose since she is intolerant of other medications.  This will depend on her for essential thrombocytosis I believe.  Reschedule appointment with Hematology.  Has appointment with rheumatologist in September.  History of constipation treated with MiraLAX.  Plan: Referral back to the [facility].  CBC with differential drawn for follow-up on thrombocytosis.  Continue Lipitor 20 mg daily.  Has  elevated LDL cholesterol of 121.  Follow-up with rheumatologist in September.  Health maintenance exam due here in 6 months.

## 2020-12-26 ENCOUNTER — Telehealth: Payer: Self-pay | Admitting: Internal Medicine

## 2020-12-26 ENCOUNTER — Telehealth: Payer: Self-pay

## 2020-12-26 ENCOUNTER — Telehealth: Payer: Self-pay | Admitting: Hematology

## 2020-12-26 DIAGNOSIS — D75839 Thrombocytosis, unspecified: Secondary | ICD-10-CM

## 2020-12-26 NOTE — Telephone Encounter (Signed)
Spoke with patient's daughter, Mateo Flow about abnormal CBC results and need for [name] to return to the [facility] and see Dr. Irene Limbo. Dr.Kale has already reviewed results this am and his staff is working to get appt. Lana Fish that her mother would be getting a call from them and that it was most important that she follow through. I believe there was some phone issue patient had previously leading to missed appt.

## 2020-12-26 NOTE — Telephone Encounter (Signed)
[name] called to let you know that her mom has made an appt to go see oncology on 01/10/21.

## 2020-12-26 NOTE — Telephone Encounter (Signed)
Scheduled appts per 8/2 sch msg. Called pt, no answer. Left msg with appts date and times.

## 2021-01-03 DIAGNOSIS — Z1231 Encounter for screening mammogram for malignant neoplasm of breast: Secondary | ICD-10-CM | POA: Diagnosis not present

## 2021-01-03 LAB — HM MAMMOGRAPHY

## 2021-01-07 ENCOUNTER — Encounter: Payer: Self-pay | Admitting: Internal Medicine

## 2021-01-10 ENCOUNTER — Inpatient Hospital Stay: Payer: Medicare Other | Attending: Hematology

## 2021-01-10 ENCOUNTER — Inpatient Hospital Stay: Payer: Medicare Other | Admitting: Hematology

## 2021-01-10 ENCOUNTER — Other Ambulatory Visit: Payer: Self-pay

## 2021-01-10 VITALS — BP 153/58 | HR 65 | Temp 98.1°F | Resp 17 | Ht 59.5 in | Wt 140.4 lb

## 2021-01-10 DIAGNOSIS — D509 Iron deficiency anemia, unspecified: Secondary | ICD-10-CM

## 2021-01-10 DIAGNOSIS — D473 Essential (hemorrhagic) thrombocythemia: Secondary | ICD-10-CM | POA: Insufficient documentation

## 2021-01-10 DIAGNOSIS — M069 Rheumatoid arthritis, unspecified: Secondary | ICD-10-CM | POA: Insufficient documentation

## 2021-01-10 DIAGNOSIS — D649 Anemia, unspecified: Secondary | ICD-10-CM | POA: Diagnosis not present

## 2021-01-10 LAB — CMP (CANCER CENTER ONLY)
ALT: 33 U/L (ref 0–44)
AST: 29 U/L (ref 15–41)
Albumin: 3.5 g/dL (ref 3.5–5.0)
Alkaline Phosphatase: 55 U/L (ref 38–126)
Anion gap: 8 (ref 5–15)
BUN: 22 mg/dL (ref 8–23)
CO2: 26 mmol/L (ref 22–32)
Calcium: 9.3 mg/dL (ref 8.9–10.3)
Chloride: 108 mmol/L (ref 98–111)
Creatinine: 1.03 mg/dL — ABNORMAL HIGH (ref 0.44–1.00)
GFR, Estimated: 54 mL/min — ABNORMAL LOW (ref >60)
Glucose, Bld: 94 mg/dL (ref 70–99)
Potassium: 3.8 mmol/L (ref 3.5–5.1)
Sodium: 142 mmol/L (ref 135–145)
Total Bilirubin: 0.4 mg/dL (ref 0.3–1.2)
Total Protein: 6.5 g/dL (ref 6.5–8.1)

## 2021-01-10 LAB — CBC WITH DIFFERENTIAL/PLATELET
Abs Immature Granulocytes: 0.06 10*3/uL (ref 0.00–0.07)
Basophils Absolute: 0.1 10*3/uL (ref 0.0–0.1)
Basophils Relative: 1 %
Eosinophils Absolute: 0.1 10*3/uL (ref 0.0–0.5)
Eosinophils Relative: 2 %
HCT: 35 % — ABNORMAL LOW (ref 36.0–46.0)
Hemoglobin: 11.3 g/dL — ABNORMAL LOW (ref 12.0–15.0)
Immature Granulocytes: 1 %
Lymphocytes Relative: 31 %
Lymphs Abs: 2.3 10*3/uL (ref 0.7–4.0)
MCH: 34.6 pg — ABNORMAL HIGH (ref 26.0–34.0)
MCHC: 32.3 g/dL (ref 30.0–36.0)
MCV: 107 fL — ABNORMAL HIGH (ref 80.0–100.0)
Monocytes Absolute: 0.8 10*3/uL (ref 0.1–1.0)
Monocytes Relative: 10 %
Neutro Abs: 4.2 10*3/uL (ref 1.7–7.7)
Neutrophils Relative %: 55 %
Platelets: 1281 10*3/uL (ref 150–400)
RBC: 3.27 MIL/uL — ABNORMAL LOW (ref 3.87–5.11)
RDW: 14.8 % (ref 11.5–15.5)
WBC: 7.5 10*3/uL (ref 4.0–10.5)
nRBC: 2.3 % — ABNORMAL HIGH (ref 0.0–0.2)

## 2021-01-10 LAB — LACTATE DEHYDROGENASE: LDH: 451 U/L — ABNORMAL HIGH (ref 98–192)

## 2021-01-10 MED ORDER — HYDROXYUREA 500 MG PO CAPS: ORAL_CAPSULE | ORAL | 1 refills | Status: DC

## 2021-01-10 NOTE — Progress Notes (Signed)
CRITICAL VALUE STICKER  CRITICAL VALUE: Plts: [phone]  DATE & TIME NOTIFIED: 01/10/21 09:24  MD NOTIFIED: Irene Limbo

## 2021-01-11 ENCOUNTER — Ambulatory Visit: Payer: Medicare Other | Admitting: Hematology

## 2021-01-11 ENCOUNTER — Other Ambulatory Visit: Payer: Medicare Other

## 2021-01-11 ENCOUNTER — Telehealth: Payer: Self-pay | Admitting: Hematology

## 2021-01-11 NOTE — Telephone Encounter (Signed)
Left message with follow-up appointment per 8/18 los. 

## 2021-01-16 ENCOUNTER — Encounter: Payer: Self-pay | Admitting: Hematology

## 2021-01-16 NOTE — Progress Notes (Signed)
HEMATOLOGY/ONCOLOGY CLINIC NOTE  Date of Service: .01/10/2021   Patient Care Team: [name], MD as PCP - [name], MD as Consulting Physician (Gastroenterology) Valinda Party, MD (Rheumatology)  CHIEF COMPLAINTS/PURPOSE OF CONSULTATION:  Mx of essential thrombocytopenia.  HISTORY OF PRESENTING ILLNESS:   [name] is a wonderful 80 y.o. female who has been referred to [place] by Dr. Renold Genta for evaluation and management of thrombocytosis and anemia. Pt is accompanied today by her daughter. The pt reports that she is doing well overall.   The pt reports that she was diagnosed with Rheumatoid Arthritis three years ago. Pt is currently being seen by Dr. Dossie Der for her RA. Pt was using IV medication for a year, likely Methotrexate, to treat her RA before she began having an allergic reaction and was stopped. She was on Prednisone to treat for two weeks and was stopped a week ago due to rash, which as resolved. She also receives Cortizone knee injections periodically, which help for a short time. Pt reports that her joint stiffness improves within 30 minutes of waking up and beginning to move about. Pt has a follow up scheduled with Dr. Dossie Der in the beginning of December.  Pt had pulmonary embolism in 2012 that was thought to be caused by back surgery. Pt was treated with Coumadin for 6 months following her PE. She denies any heart attacks or strokes. Pt is currently taking three 81 mg Aspirin daily. Pt has IBS and complains of occasional abdominal discomfort. Pt has carpal tunnel and has received local injections, but surgery is not a current consideration. She has no family history of blood conditions or disorders. She is allergic to Allopurinol. She has received her COVID19 vaccines and plans on receiving the booster.   Most recent lab results (01/20/2020) of CBC and CMP is as follows: all values are WNL except for WBC at 12.7K, RBC at 3.02, Hgb at 10.0, HCT at 29.9, MCH at  33.1, PLT at 1208K, Neutro Abs at 9309, Mono Abs at 1346, Eos Abs at 0, Glucose at 111, Creatinine at 1.15, GFR Est Af Am at 52, Sodium at 129, Chloride at 95. 01/19/2020 Sed Rate at 98  On review of systems, pt reports fingertip numbness and denies vision changes, SOB, leg swelling, fingertip discoloration, unexpected weight loss, fevers, chills, night sweats and any other symptoms.   On PMHx the pt reports Lumbar fusion, Rheumatoid Arthritis, Cervical Stenosis, IBS, PE, Carpal Tunnel. On Social Hx the pt reports that she is a non-smoker, but had significant second-hand exposure previously.   INTERVAL HISTORY:  Ms Pint is here for follow-up of her essential thrombocytosis.  She was last seen in clinic on 06/18/2020 and was lost to follow-up. She was asked to come back by her primary care physician to reestablish care with [place]. We discussed in detail the extreme importance of maintaining medical compliance and compliance with follow-up for her condition. She has been off her hydroxyurea for several months and her labs show her platelet counts are back up to 1.3 million. We discussed the risk of bleeding and clotting related to this and the importance of immediately restarting her hydroxyurea. He has been compliant with her daily aspirin. No new chest pain or shortness of breath.  No new leg swelling.  No strokelike symptoms or change in vision. Continues to follow with her rheumatologist Dr. Dossie Der for management of her rheumatoid arthritis.  Notes that she is currently not on any DMARDS.  MEDICAL HISTORY:  Past Medical History:  Diagnosis Date   Arthritis    Cervical stenosis of spine    Dysphasia    Essential hypertension, benign    GERD (gastroesophageal reflux disease)    Glaucoma    Heart murmur    History of estrogen therapy    Hypercholesteremia    IBS (irritable bowel syndrome)    Mitral regurgitation    Pulmonary air embolism (HCC)    states she had this wiht her back surgery    Rheumatoid arthritis ([place])    Seborrhea    patient denies    SURGICAL HISTORY: Past Surgical History:  Procedure Laterality Date   ANTERIOR AND POSTERIOR REPAIR WITH SACROSPINOUS FIXATION N/A 01/08/2015   Procedure: ANTERIOR AND POSTERIOR REPAIR WITH possible SACROSPINOUS FIXATION;  Surgeon: Molli Posey, MD;  Location: [facility];  Service: Gynecology;  Laterality: N/A;   BACK SURGERY     CATARACT EXTRACTION W/ INTRAOCULAR LENS  IMPLANT, BILATERAL Bilateral    COLONOSCOPY     EYE SURGERY     LAPAROSCOPIC ASSISTED VAGINAL HYSTERECTOMY N/A 01/08/2015   Procedure: LAPAROSCOPIC ASSISTED VAGINAL HYSTERECTOMY;  Surgeon: Molli Posey, MD;  Location: [facility];  Service: Gynecology;  Laterality: N/A;   LUMBAR FUSION  05/2010   REVERSE SHOULDER ARTHROPLASTY Left 02/16/2014   Procedure: LEFT REVERSE TOTAL SHOULDER ARTHROPLASTY;  Surgeon: Marin Shutter, MD;  Location: [facility];  Service: Orthopedics;  Laterality: Left;   REVERSE SHOULDER ARTHROPLASTY Right 08/07/2016   REVERSE SHOULDER ARTHROPLASTY Right 08/07/2016   Procedure: REVERSE RIGHT SHOULDER ARTHROPLASTY;  Surgeon: Justice Britain, MD;  Location: [facility];  Service: Orthopedics;  Laterality: Right;   TOE SURGERY     TUBAL LIGATION      SOCIAL HISTORY: Social History   Socioeconomic History   Marital status: Widowed    Spouse name: Not on file   Number of children: 9   Years of education: 7   Highest education level: Not on file  Occupational History   Not on file  Tobacco Use   Smoking status: Never   Smokeless tobacco: Never  Substance and Sexual Activity   Alcohol use: No   Drug use: No   Sexual activity: Never  Other Topics Concern   Not on file  Social History Narrative   Lives with daughter   Caffeine use: none   Social Determinants of Health   Financial Resource Strain: Not on file  Food Insecurity: Not on file  Transportation Needs: Not on file  Physical Activity: Not on file  Stress: Not on file  Social  Connections: Not on file  Intimate Partner Violence: Not on file    FAMILY HISTORY: Family History  Problem Relation Age of Onset   Healthy Daughter     ALLERGIES:  is allergic to alendronate, allopurinol, amoxicillin-pot clavulanate, folic acid, methotrexate derivatives, latex, and tramadol.  MEDICATIONS:  Current Outpatient Medications  Medication Sig Dispense Refill   hydroxyurea (HYDREA) 500 MG capsule Take 2 capsules  [1000 mg] orally on Monday Wednesday and Friday .  Take 1 capsule [500 mg] orally on Tuesday Thursday Saturday and Sunday. May take with food to minimize GI side effects. 90 capsule 1   aspirin 81 MG tablet Take 81 mg by mouth daily.     atorvastatin (LIPITOR) 20 MG tablet TAKE 1 TABLET BY MOUTH  DAILY 90 tablet 3   clotrimazole-betamethasone (LOTRISONE) cream Apply 1 application topically 2 (two) times daily. 45 g 2   esomeprazole (NEXIUM) 40 MG capsule  TAKE 1 CAPSULE BY MOUTH  DAILY IN THE MORNING 90 capsule 3   mupirocin ointment (BACTROBAN) 2 % APPLY TOPICALLY TO THE AFFECTED AREA TWICE DAILY AS NEEDED 22 g 11   NEOMYCIN-POLYMYXIN-HYDROCORTISONE (CORTISPORIN) 1 % SOLN OTIC solution Place 3 drops into the left ear 4 (four) times daily. 10 mL 0   polyethylene glycol powder (GLYCOLAX/MIRALAX) powder MIX 1 CAPFUL IN 8 OUNCES OF WATER AND DRINK ONCE DAILY AS DIRECTED 527 g 0   No current facility-administered medications for this visit.    REVIEW OF SYSTEMS:   10 Point review of Systems was done is negative except as noted above.   PHYSICAL EXAMINATION: ECOG PERFORMANCE STATUS: 1 - Symptomatic but completely ambulatory   Telehealth Visit.    LABORATORY DATA:  I have reviewed the data as listed . CBC Latest Ref Rng & Units 01/10/2021 12/25/2020 06/22/2020  WBC 4.0 - 10.5 K/uL 7.5 13.3(H) 6.4  Hemoglobin 12.0 - 15.0 g/dL 11.3(L) 11.9 11.2(L)  Hematocrit 36.0 - 46.0 % 35.0(L) 35.3 32.5(L)  Platelets 150 - 400 K/uL 1,281(HH) 1,292(H) 886(H)    . CMP Latest  Ref Rng & Units 01/10/2021 12/20/2020 06/22/2020  Glucose 70 - 99 mg/dL 94 - 77  BUN 8 - 23 mg/dL 22 - 19  Creatinine 0.44 - 1.00 mg/dL 1.03(H) - 0.91(H)  Sodium 135 - 145 mmol/L 142 - 138  Potassium 3.5 - 5.1 mmol/L 3.8 - 4.6  Chloride 98 - 111 mmol/L 108 - 104  CO2 22 - 32 mmol/L 26 - 25  Calcium 8.9 - 10.3 mg/dL 9.3 - 9.5  Total Protein 6.5 - 8.1 g/dL 6.5 7.1 6.3  Total Bilirubin 0.3 - 1.2 mg/dL 0.4 0.6 0.5  Alkaline Phos 38 - 126 U/L 55 - -  AST 15 - 41 U/L 29 41(H) 23  ALT 0 - 44 U/L 33 70(H) 20       RADIOGRAPHIC STUDIES: I have personally reviewed the radiological images as listed and agreed with the findings in the report. No results found.  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  She did not follow-up as per recommendations and has been off her hydroxyurea for several months.  Her labs that her platelets have gone up to 1.3 million, hemoglobin is 11.3 with MCV of 107.  WBC count of 7.5k -She was counseled about importance of medical follow-up and compliance with her medications. -No new symptoms to suggest VTE or arterial thrombosis.  No bleeding issues. -She will restart hydroxyurea at 1000 mg on Monday Wednesday and Friday and 500 mg on the rest of the days of the week. -Continue 2000 IU Vitamin D daily. -Continue Aspirin, B-complex. -Discussed consideration of, potential side effects and benefits of Evusheld to provide additional protection against COVID-19 in the context of her chemotherapy use and immunosuppressed state from bone marrow disorder and autoimmune condition.  Patient is agreeable.  We shall set this up for her to get with her upcoming follow-up in 4 weeks.   FOLLOW UP:  Evusheld injection appointment in 4  weeks Labs and MD visit in 4 weeks.   The total time spent in the appointment was 30 minutes and more than 50% was on counseling and direct patient cares.   All of the patient's questions were answered with apparent  satisfaction. The patient knows to call the clinic with any problems, questions or concerns.    Sullivan Lone MD Phil Campbell AAHIVMS [facility] Hematology/Oncology Physician [facility]  (Office):       [phone] (Work cell):  [phone] (Fax):           [phone]

## 2021-01-17 ENCOUNTER — Telehealth: Payer: Self-pay | Admitting: Hematology

## 2021-01-17 NOTE — Telephone Encounter (Signed)
Scheduled appt per 8/24 sch msg. Pt aware.

## 2021-01-22 ENCOUNTER — Other Ambulatory Visit: Payer: Self-pay | Admitting: Hematology

## 2021-01-22 ENCOUNTER — Inpatient Hospital Stay: Payer: Medicare Other

## 2021-01-22 ENCOUNTER — Other Ambulatory Visit: Payer: Self-pay

## 2021-01-22 DIAGNOSIS — D473 Essential (hemorrhagic) thrombocythemia: Secondary | ICD-10-CM

## 2021-01-22 MED ORDER — TIXAGEVIMAB (PART OF EVUSHELD) INJECTION
300.0000 mg | Freq: Once | INTRAMUSCULAR | Status: DC
  Filled 2021-01-22: qty 3

## 2021-01-22 MED ORDER — CILGAVIMAB (PART OF EVUSHELD) INJECTION
300.0000 mg | Freq: Once | INTRAMUSCULAR | Status: DC
  Filled 2021-01-22: qty 3

## 2021-01-22 NOTE — Progress Notes (Signed)
Patient was to receive the Evusheld injection today, but decided against it. I informed Dr. Irene Limbo that the patient would like him to call her to give her some more information about the injection.

## 2021-01-22 NOTE — Progress Notes (Signed)
Evusheld orders placed 

## 2021-02-08 ENCOUNTER — Other Ambulatory Visit: Payer: Self-pay

## 2021-02-08 ENCOUNTER — Inpatient Hospital Stay: Payer: Medicare Other

## 2021-02-08 ENCOUNTER — Inpatient Hospital Stay: Payer: Medicare Other | Attending: Hematology | Admitting: Hematology

## 2021-02-08 VITALS — BP 170/65 | HR 63 | Temp 97.6°F | Resp 17 | Ht 59.5 in | Wt 143.2 lb

## 2021-02-08 DIAGNOSIS — D649 Anemia, unspecified: Secondary | ICD-10-CM

## 2021-02-08 DIAGNOSIS — M069 Rheumatoid arthritis, unspecified: Secondary | ICD-10-CM | POA: Diagnosis not present

## 2021-02-08 DIAGNOSIS — D473 Essential (hemorrhagic) thrombocythemia: Secondary | ICD-10-CM

## 2021-02-08 LAB — RETICULOCYTES
Immature Retic Fract: 26.5 % — ABNORMAL HIGH (ref 2.3–15.9)
RBC.: 3.04 MIL/uL — ABNORMAL LOW (ref 3.87–5.11)
Retic Count, Absolute: 49.6 10*3/uL (ref 19.0–186.0)
Retic Ct Pct: 1.6 % (ref 0.4–3.1)

## 2021-02-08 LAB — CMP (CANCER CENTER ONLY)
ALT: 20 U/L (ref 0–44)
AST: 28 U/L (ref 15–41)
Albumin: 3.7 g/dL (ref 3.5–5.0)
Alkaline Phosphatase: 58 U/L (ref 38–126)
Anion gap: 9 (ref 5–15)
BUN: 19 mg/dL (ref 8–23)
CO2: 25 mmol/L (ref 22–32)
Calcium: 9.3 mg/dL (ref 8.9–10.3)
Chloride: 107 mmol/L (ref 98–111)
Creatinine: 0.86 mg/dL (ref 0.44–1.00)
GFR, Estimated: >60 mL/min (ref >60)
Glucose, Bld: 84 mg/dL (ref 70–99)
Potassium: 3.6 mmol/L (ref 3.5–5.1)
Sodium: 141 mmol/L (ref 135–145)
Total Bilirubin: 0.4 mg/dL (ref 0.3–1.2)
Total Protein: 6.8 g/dL (ref 6.5–8.1)

## 2021-02-08 LAB — CBC WITH DIFFERENTIAL/PLATELET
Abs Immature Granulocytes: 0.02 10*3/uL (ref 0.00–0.07)
Basophils Absolute: 0 10*3/uL (ref 0.0–0.1)
Basophils Relative: 1 %
Eosinophils Absolute: 0.2 10*3/uL (ref 0.0–0.5)
Eosinophils Relative: 3 %
HCT: 32.2 % — ABNORMAL LOW (ref 36.0–46.0)
Hemoglobin: 10.5 g/dL — ABNORMAL LOW (ref 12.0–15.0)
Immature Granulocytes: 0 %
Lymphocytes Relative: 35 %
Lymphs Abs: 2.1 10*3/uL (ref 0.7–4.0)
MCH: 34 pg (ref 26.0–34.0)
MCHC: 32.6 g/dL (ref 30.0–36.0)
MCV: 104.2 fL — ABNORMAL HIGH (ref 80.0–100.0)
Monocytes Absolute: 0.7 10*3/uL (ref 0.1–1.0)
Monocytes Relative: 12 %
Neutro Abs: 3.1 10*3/uL (ref 1.7–7.7)
Neutrophils Relative %: 49 %
Platelets: 829 10*3/uL — ABNORMAL HIGH (ref 150–400)
RBC: 3.09 MIL/uL — ABNORMAL LOW (ref 3.87–5.11)
RDW: 16.3 % — ABNORMAL HIGH (ref 11.5–15.5)
WBC: 6.1 10*3/uL (ref 4.0–10.5)
nRBC: 1 % — ABNORMAL HIGH (ref 0.0–0.2)

## 2021-02-08 LAB — IRON AND TIBC
Iron: 103 ug/dL (ref 41–142)
Saturation Ratios: 35 % (ref 21–57)
TIBC: 293 ug/dL (ref 236–444)
UIBC: 190 ug/dL (ref 120–384)

## 2021-02-08 LAB — FERRITIN: Ferritin: 577 ng/mL — ABNORMAL HIGH (ref 11–307)

## 2021-02-08 MED ORDER — HYDROXYUREA 500 MG PO CAPS: ORAL_CAPSULE | ORAL | 1 refills | Status: DC

## 2021-02-11 ENCOUNTER — Encounter: Payer: Self-pay | Admitting: Internal Medicine

## 2021-02-11 DIAGNOSIS — M059 Rheumatoid arthritis with rheumatoid factor, unspecified: Secondary | ICD-10-CM | POA: Diagnosis not present

## 2021-02-11 DIAGNOSIS — M1990 Unspecified osteoarthritis, unspecified site: Secondary | ICD-10-CM | POA: Diagnosis not present

## 2021-02-11 DIAGNOSIS — M810 Age-related osteoporosis without current pathological fracture: Secondary | ICD-10-CM | POA: Diagnosis not present

## 2021-02-11 DIAGNOSIS — D473 Essential (hemorrhagic) thrombocythemia: Secondary | ICD-10-CM | POA: Diagnosis not present

## 2021-02-11 DIAGNOSIS — E790 Hyperuricemia without signs of inflammatory arthritis and tophaceous disease: Secondary | ICD-10-CM | POA: Diagnosis not present

## 2021-02-11 DIAGNOSIS — I10 Essential (primary) hypertension: Secondary | ICD-10-CM | POA: Diagnosis not present

## 2021-02-11 DIAGNOSIS — Z79899 Other long term (current) drug therapy: Secondary | ICD-10-CM | POA: Diagnosis not present

## 2021-02-11 DIAGNOSIS — M7989 Other specified soft tissue disorders: Secondary | ICD-10-CM | POA: Diagnosis not present

## 2021-02-14 ENCOUNTER — Encounter: Payer: Self-pay | Admitting: Hematology

## 2021-02-14 NOTE — Progress Notes (Signed)
HEMATOLOGY/ONCOLOGY CLINIC NOTE  Date of Service: .02/08/2021   Patient Care Team: [name], MD as PCP - [name], MD as Consulting Physician (Gastroenterology) Valinda Party, MD (Rheumatology)  CHIEF COMPLAINTS/PURPOSE OF CONSULTATION:  Mx of essential thrombocytopenia.  HISTORY OF PRESENTING ILLNESS:   [name] is a wonderful 80 y.o. female who has been referred to [place] by Dr. Renold Genta for evaluation and management of thrombocytosis and anemia. Pt is accompanied today by her daughter. The pt reports that she is doing well overall.   The pt reports that she was diagnosed with Rheumatoid Arthritis three years ago. Pt is currently being seen by Dr. Dossie Der for her RA. Pt was using IV medication for a year, likely Methotrexate, to treat her RA before she began having an allergic reaction and was stopped. She was on Prednisone to treat for two weeks and was stopped a week ago due to rash, which as resolved. She also receives Cortizone knee injections periodically, which help for a short time. Pt reports that her joint stiffness improves within 30 minutes of waking up and beginning to move about. Pt has a follow up scheduled with Dr. Dossie Der in the beginning of December.  Pt had pulmonary embolism in 2012 that was thought to be caused by back surgery. Pt was treated with Coumadin for 6 months following her PE. She denies any heart attacks or strokes. Pt is currently taking three 81 mg Aspirin daily. Pt has IBS and complains of occasional abdominal discomfort. Pt has carpal tunnel and has received local injections, but surgery is not a current consideration. She has no family history of blood conditions or disorders. She is allergic to Allopurinol. She has received her COVID19 vaccines and plans on receiving the booster.   Most recent lab results (01/20/2020) of CBC and CMP is as follows: all values are WNL except for WBC at 12.7K, RBC at 3.02, Hgb at 10.0, HCT at 29.9, MCH at  33.1, PLT at 1208K, Neutro Abs at 9309, Mono Abs at 1346, Eos Abs at 0, Glucose at 111, Creatinine at 1.15, GFR Est Af Am at 52, Sodium at 129, Chloride at 95. 01/19/2020 Sed Rate at 98  On review of systems, pt reports fingertip numbness and denies vision changes, SOB, leg swelling, fingertip discoloration, unexpected weight loss, fevers, chills, night sweats and any other symptoms.   On PMHx the pt reports Lumbar fusion, Rheumatoid Arthritis, Cervical Stenosis, IBS, PE, Carpal Tunnel. On Social Hx the pt reports that she is a non-smoker, but had significant second-hand exposure previously.   INTERVAL HISTORY:  [name] is here for follow-up of her essential thrombocytosis.  She was last seen in clinic on 01/10/2021. Patient notes that she has been compliant with her hydroxyurea and aspirin.  Labs done today reviewed No new chest pain or shortness of breath.  No new leg swelling.  No strokelike symptoms or change in vision. Continues to follow with her rheumatologist Dr. Dossie Der for management of her rheumatoid arthritis.    MEDICAL HISTORY:  Past Medical History:  Diagnosis Date   Arthritis    Cervical stenosis of spine    Dysphasia    Essential hypertension, benign    GERD (gastroesophageal reflux disease)    Glaucoma    Heart murmur    History of estrogen therapy    Hypercholesteremia    IBS (irritable bowel syndrome)    Mitral regurgitation    Pulmonary air embolism (HCC)    states  she had this wiht her back surgery   Rheumatoid arthritis ([place])    Seborrhea    patient denies    SURGICAL HISTORY: Past Surgical History:  Procedure Laterality Date   ANTERIOR AND POSTERIOR REPAIR WITH SACROSPINOUS FIXATION N/A 01/08/2015   Procedure: ANTERIOR AND POSTERIOR REPAIR WITH possible SACROSPINOUS FIXATION;  Surgeon: Molli Posey, MD;  Location: [facility];  Service: Gynecology;  Laterality: N/A;   BACK SURGERY     CATARACT EXTRACTION W/ INTRAOCULAR LENS  IMPLANT, BILATERAL Bilateral     COLONOSCOPY     EYE SURGERY     LAPAROSCOPIC ASSISTED VAGINAL HYSTERECTOMY N/A 01/08/2015   Procedure: LAPAROSCOPIC ASSISTED VAGINAL HYSTERECTOMY;  Surgeon: Molli Posey, MD;  Location: [facility];  Service: Gynecology;  Laterality: N/A;   LUMBAR FUSION  05/2010   REVERSE SHOULDER ARTHROPLASTY Left 02/16/2014   Procedure: LEFT REVERSE TOTAL SHOULDER ARTHROPLASTY;  Surgeon: Marin Shutter, MD;  Location: [place];  Service: Orthopedics;  Laterality: Left;   REVERSE SHOULDER ARTHROPLASTY Right 08/07/2016   REVERSE SHOULDER ARTHROPLASTY Right 08/07/2016   Procedure: REVERSE RIGHT SHOULDER ARTHROPLASTY;  Surgeon: Justice Britain, MD;  Location: [place];  Service: Orthopedics;  Laterality: Right;   TOE SURGERY     TUBAL LIGATION      SOCIAL HISTORY: Social History   Socioeconomic History   Marital status: Widowed    Spouse name: Not on file   Number of children: 9   Years of education: 7   Highest education level: Not on file  Occupational History   Not on file  Tobacco Use   Smoking status: Never   Smokeless tobacco: Never  Substance and Sexual Activity   Alcohol use: No   Drug use: No   Sexual activity: Never  Other Topics Concern   Not on file  Social History Narrative   Lives with daughter   Caffeine use: none   Social Determinants of Health   Financial Resource Strain: Not on file  Food Insecurity: Not on file  Transportation Needs: Not on file  Physical Activity: Not on file  Stress: Not on file  Social Connections: Not on file  Intimate Partner Violence: Not on file    FAMILY HISTORY: Family History  Problem Relation Age of Onset   Healthy Daughter     ALLERGIES:  is allergic to alendronate, allopurinol, amoxicillin-pot clavulanate, folic acid, methotrexate derivatives, latex, and tramadol.  MEDICATIONS:  Current Outpatient Medications  Medication Sig Dispense Refill   aspirin 81 MG tablet Take 81 mg by mouth daily.     atorvastatin (LIPITOR) 20 MG tablet TAKE 1  TABLET BY MOUTH  DAILY 90 tablet 3   clotrimazole-betamethasone (LOTRISONE) cream Apply 1 application topically 2 (two) times daily. 45 g 2   esomeprazole (NEXIUM) 40 MG capsule TAKE 1 CAPSULE BY MOUTH  DAILY IN THE MORNING 90 capsule 3   hydroxyurea (HYDREA) 500 MG capsule Take 2 capsules  [1000 mg] orally on Monday, Tuesday, Wednesday, thursday and Friday .  Take 1 capsule [500 mg] orally on Saturday and Sunday. May take with food to minimize GI side effects. 90 capsule 1   mupirocin ointment (BACTROBAN) 2 % APPLY TOPICALLY TO THE AFFECTED AREA TWICE DAILY AS NEEDED 22 g 11   NEOMYCIN-POLYMYXIN-HYDROCORTISONE (CORTISPORIN) 1 % SOLN OTIC solution Place 3 drops into the left ear 4 (four) times daily. 10 mL 0   polyethylene glycol powder (GLYCOLAX/MIRALAX) powder MIX 1 CAPFUL IN 8 OUNCES OF WATER AND DRINK ONCE DAILY AS DIRECTED 527 g  0   No current facility-administered medications for this visit.    REVIEW OF SYSTEMS:   .10 Point review of Systems was done is negative except as noted above.  PHYSICAL EXAMINATION: ECOG PERFORMANCE STATUS: 1 - Symptomatic but completely ambulatory   Telehealth Visit.    LABORATORY DATA:  I have reviewed the data as listed . CBC Latest Ref Rng & Units 02/08/2021 01/10/2021 12/25/2020  WBC 4.0 - 10.5 K/uL 6.1 7.5 13.3(H)  Hemoglobin 12.0 - 15.0 g/dL 10.5(L) 11.3(L) 11.9  Hematocrit 36.0 - 46.0 % 32.2(L) 35.0(L) 35.3  Platelets 150 - 400 K/uL 829(H) 1,281(HH) 1,292(H)    . CMP Latest Ref Rng & Units 02/08/2021 01/10/2021 12/20/2020  Glucose 70 - 99 mg/dL 84 94 -  BUN 8 - 23 mg/dL 19 22 -  Creatinine 0.44 - 1.00 mg/dL 0.86 1.03(H) -  Sodium 135 - 145 mmol/L 141 142 -  Potassium 3.5 - 5.1 mmol/L 3.6 3.8 -  Chloride 98 - 111 mmol/L 107 108 -  CO2 22 - 32 mmol/L 25 26 -  Calcium 8.9 - 10.3 mg/dL 9.3 9.3 -  Total Protein 6.5 - 8.1 g/dL 6.8 6.5 7.1  Total Bilirubin 0.3 - 1.2 mg/dL 0.4 0.4 0.6  Alkaline Phos 38 - 126 U/L 58 55 -  AST 15 - 41 U/L 28 29 41(H)   ALT 0 - 44 U/L 20 33 70(H)       RADIOGRAPHIC STUDIES: I have personally reviewed the radiological images as listed and agreed with the findings in the report. No results found.  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  CBC shows hemoglobin of 10.5 platelets of 829k down from 1.28 million. -No new symptoms to suggest VTE or arterial thrombosis.  No bleeding issues. -She will increase hydroxyurea to Take 2 capsules  [1000 mg] orally on Monday, Tuesday, Wednesday, thursday and Friday .  Take 1 capsule [500 mg] orally on Saturday and "Sunday. . -Continue 2000 IU Vitamin D daily. -Continue Aspirin, B-complex.  FOLLOW UP:  Phone visit with Dr Carter Kaman in 4 weeks Labs 1 day prior to phone visit  . The total time spent in the appointment was 20 minutes and more than 50% was on counseling and direct patient cares.  All of the patient's questions were answered with apparent satisfaction. The patient knows to call the clinic with any problems, questions or concerns.    Sybrina Laning MD Tanya AAHIVMS [facility] Hematology/Oncology Physician [facility]  (Office):       [phone] (Work cell):  [phone] (Fax):           [phone]

## 2021-03-01 ENCOUNTER — Telehealth: Payer: Self-pay | Admitting: Hematology

## 2021-03-01 NOTE — Telephone Encounter (Signed)
Left message with rescheduled upcoming appointment due to provider's schedule. 

## 2021-03-07 ENCOUNTER — Other Ambulatory Visit: Payer: Self-pay

## 2021-03-07 ENCOUNTER — Inpatient Hospital Stay: Payer: Medicare Other | Attending: Hematology

## 2021-03-07 DIAGNOSIS — M069 Rheumatoid arthritis, unspecified: Secondary | ICD-10-CM | POA: Insufficient documentation

## 2021-03-07 DIAGNOSIS — D473 Essential (hemorrhagic) thrombocythemia: Secondary | ICD-10-CM

## 2021-03-07 LAB — RETIC PANEL
Immature Retic Fract: 32.5 % — ABNORMAL HIGH (ref 2.3–15.9)
RBC.: 3.14 MIL/uL — ABNORMAL LOW (ref 3.87–5.11)
Retic Count, Absolute: 61.2 10*3/uL (ref 19.0–186.0)
Retic Ct Pct: 2 % (ref 0.4–3.1)
Reticulocyte Hemoglobin: 39.3 pg (ref >27.9)

## 2021-03-07 LAB — CMP (CANCER CENTER ONLY)
ALT: 41 U/L (ref 0–44)
AST: 33 U/L (ref 15–41)
Albumin: 3.7 g/dL (ref 3.5–5.0)
Alkaline Phosphatase: 52 U/L (ref 38–126)
Anion gap: 7 (ref 5–15)
BUN: 23 mg/dL (ref 8–23)
CO2: 28 mmol/L (ref 22–32)
Calcium: 9.1 mg/dL (ref 8.9–10.3)
Chloride: 103 mmol/L (ref 98–111)
Creatinine: 1 mg/dL (ref 0.44–1.00)
GFR, Estimated: 56 mL/min — ABNORMAL LOW (ref >60)
Glucose, Bld: 79 mg/dL (ref 70–99)
Potassium: 3.5 mmol/L (ref 3.5–5.1)
Sodium: 138 mmol/L (ref 135–145)
Total Bilirubin: 0.7 mg/dL (ref 0.3–1.2)
Total Protein: 6.9 g/dL (ref 6.5–8.1)

## 2021-03-07 LAB — CBC WITH DIFFERENTIAL (CANCER CENTER ONLY)
Abs Immature Granulocytes: 0.03 10*3/uL (ref 0.00–0.07)
Basophils Absolute: 0 10*3/uL (ref 0.0–0.1)
Basophils Relative: 0 %
Eosinophils Absolute: 0 10*3/uL (ref 0.0–0.5)
Eosinophils Relative: 0 %
HCT: 33 % — ABNORMAL LOW (ref 36.0–46.0)
Hemoglobin: 11.1 g/dL — ABNORMAL LOW (ref 12.0–15.0)
Immature Granulocytes: 0 %
Lymphocytes Relative: 28 %
Lymphs Abs: 2.3 10*3/uL (ref 0.7–4.0)
MCH: 35.2 pg — ABNORMAL HIGH (ref 26.0–34.0)
MCHC: 33.6 g/dL (ref 30.0–36.0)
MCV: 104.8 fL — ABNORMAL HIGH (ref 80.0–100.0)
Monocytes Absolute: 0.8 10*3/uL (ref 0.1–1.0)
Monocytes Relative: 10 %
Neutro Abs: 4.9 10*3/uL (ref 1.7–7.7)
Neutrophils Relative %: 62 %
Platelet Count: 905 10*3/uL (ref 150–400)
RBC: 3.15 MIL/uL — ABNORMAL LOW (ref 3.87–5.11)
RDW: 18.4 % — ABNORMAL HIGH (ref 11.5–15.5)
WBC Count: 8.1 10*3/uL (ref 4.0–10.5)
nRBC: 0.7 % — ABNORMAL HIGH (ref 0.0–0.2)

## 2021-03-07 LAB — IRON AND TIBC
Iron: 106 ug/dL (ref 28–170)
Saturation Ratios: 33 % — ABNORMAL HIGH (ref 10.4–31.8)
TIBC: 318 ug/dL (ref 250–450)
UIBC: 212 ug/dL

## 2021-03-07 LAB — FERRITIN: Ferritin: 550 ng/mL — ABNORMAL HIGH (ref 11–307)

## 2021-03-08 ENCOUNTER — Inpatient Hospital Stay (HOSPITAL_BASED_OUTPATIENT_CLINIC_OR_DEPARTMENT_OTHER): Payer: Medicare Other | Admitting: Hematology

## 2021-03-08 DIAGNOSIS — D473 Essential (hemorrhagic) thrombocythemia: Secondary | ICD-10-CM | POA: Diagnosis not present

## 2021-03-08 DIAGNOSIS — M069 Rheumatoid arthritis, unspecified: Secondary | ICD-10-CM | POA: Diagnosis not present

## 2021-03-08 MED ORDER — HYDROXYUREA 500 MG PO CAPS: 1000.0000 mg | ORAL_CAPSULE | Freq: Every day | ORAL | 3 refills | Status: DC

## 2021-03-08 NOTE — Progress Notes (Addendum)
HEMATOLOGY/ONCOLOGY CLINIC NOTE  Date of Service: .03/08/2021   Patient Care Team: [name], MD as PCP - Tanya Homans, MD as Consulting Physician (Gastroenterology) Valinda Party, MD (Rheumatology)  CHIEF COMPLAINTS/PURPOSE OF CONSULTATION:  Mx of essential thrombocytopenia.  HISTORY OF PRESENTING ILLNESS:   [name] is a wonderful 80 y.o. female who has been referred to [place] by Dr. Renold Genta for evaluation and management of thrombocytosis and anemia. Pt is accompanied today by her daughter. The pt reports that she is doing well overall.   The pt reports that she was diagnosed with Rheumatoid Arthritis three years ago. Pt is currently being seen by Dr. Dossie Der for her RA. Pt was using IV medication for a year, likely Methotrexate, to treat her RA before she began having an allergic reaction and was stopped. She was on Prednisone to treat for two weeks and was stopped a week ago due to rash, which as resolved. She also receives Cortizone knee injections periodically, which help for a short time. Pt reports that her joint stiffness improves within 30 minutes of waking up and beginning to move about. Pt has a follow up scheduled with Dr. Dossie Der in the beginning of December.  Pt had pulmonary embolism in 2012 that was thought to be caused by back surgery. Pt was treated with Coumadin for 6 months following her PE. She denies any heart attacks or strokes. Pt is currently taking three 81 mg Aspirin daily. Pt has IBS and complains of occasional abdominal discomfort. Pt has carpal tunnel and has received local injections, but surgery is not a current consideration. She has no family history of blood conditions or disorders. She is allergic to Allopurinol. She has received her COVID19 vaccines and plans on receiving the booster.   Most recent lab results (01/20/2020) of CBC and CMP is as follows: all values are WNL except for WBC at 12.7K, RBC at 3.02, Hgb at 10.0, HCT at 29.9, MCH at  33.1, PLT at 1208K, Neutro Abs at 9309, Mono Abs at 1346, Eos Abs at 0, Glucose at 111, Creatinine at 1.15, GFR Est Af Am at 52, Sodium at 129, Chloride at 95. 01/19/2020 Sed Rate at 98  On review of systems, pt reports fingertip numbness and denies vision changes, SOB, leg swelling, fingertip discoloration, unexpected weight loss, fevers, chills, night sweats and any other symptoms.   On PMHx the pt reports Lumbar fusion, Rheumatoid Arthritis, Cervical Stenosis, IBS, PE, Carpal Tunnel. On Social Hx the pt reports that she is a non-smoker, but had significant second-hand exposure previously.   INTERVAL HISTORY:  .I connected with DALI KRANER on .03/08/2021 at  9:40 AM EDT by telephone visit and verified that I am speaking with the correct person using two identifiers.   I discussed the limitations, risks, security and privacy concerns of performing an evaluation and management service by telemedicine and the availability of in-person appointments. I also discussed with the patient that there may be a patient responsible charge related to this service. The patient expressed understanding and agreed to proceed.   Patient's location: Home  Provider's location: [facility]   Ms [name] is here for follow-up of her essential thrombocytosis.  She was last seen in clinic on 9/162022. Patient notes that she has been compliant with her hydroxyurea and aspirin. Got flu shot and new covid booster Started on steroids by rheumatologist Labs done today cbc/ diff - hgb 11.1, platelets still elevated at 905k and wbc  8.1k CMP stable Ferritin 550 Iron saturation of 33%  No new chest pain or shortness of breath.  No new leg swelling.  No strokelike symptoms or change in vision. Continues to follow with her rheumatologist Dr. Dossie Der for management of her rheumatoid arthritis.    MEDICAL HISTORY:  Past Medical History:  Diagnosis Date   Arthritis    Cervical stenosis of spine    Dysphasia     Essential hypertension, benign    GERD (gastroesophageal reflux disease)    Glaucoma    Heart murmur    History of estrogen therapy    Hypercholesteremia    IBS (irritable bowel syndrome)    Mitral regurgitation    Pulmonary air embolism (HCC)    states she had this wiht her back surgery   Rheumatoid arthritis ([place])    Seborrhea    patient denies    SURGICAL HISTORY: Past Surgical History:  Procedure Laterality Date   ANTERIOR AND POSTERIOR REPAIR WITH SACROSPINOUS FIXATION N/A 01/08/2015   Procedure: ANTERIOR AND POSTERIOR REPAIR WITH possible SACROSPINOUS FIXATION;  Surgeon: Molli Posey, MD;  Location: [facility];  Service: Gynecology;  Laterality: N/A;   BACK SURGERY     CATARACT EXTRACTION W/ INTRAOCULAR LENS  IMPLANT, BILATERAL Bilateral    COLONOSCOPY     EYE SURGERY     LAPAROSCOPIC ASSISTED VAGINAL HYSTERECTOMY N/A 01/08/2015   Procedure: LAPAROSCOPIC ASSISTED VAGINAL HYSTERECTOMY;  Surgeon: Molli Posey, MD;  Location: [facility];  Service: Gynecology;  Laterality: N/A;   LUMBAR FUSION  05/2010   REVERSE SHOULDER ARTHROPLASTY Left 02/16/2014   Procedure: LEFT REVERSE TOTAL SHOULDER ARTHROPLASTY;  Surgeon: Marin Shutter, MD;  Location: [facility];  Service: Orthopedics;  Laterality: Left;   REVERSE SHOULDER ARTHROPLASTY Right 08/07/2016   REVERSE SHOULDER ARTHROPLASTY Right 08/07/2016   Procedure: REVERSE RIGHT SHOULDER ARTHROPLASTY;  Surgeon: Justice Britain, MD;  Location: [facility];  Service: Orthopedics;  Laterality: Right;   TOE SURGERY     TUBAL LIGATION      SOCIAL HISTORY: Social History   Socioeconomic History   Marital status: Widowed    Spouse name: Not on file   Number of children: 9   Years of education: 7   Highest education level: Not on file  Occupational History   Not on file  Tobacco Use   Smoking status: Never   Smokeless tobacco: Never  Substance and Sexual Activity   Alcohol use: No   Drug use: No   Sexual activity: Never  Other Topics Concern    Not on file  Social History Narrative   Lives with daughter   Caffeine use: none   Social Determinants of Health   Financial Resource Strain: Not on file  Food Insecurity: Not on file  Transportation Needs: Not on file  Physical Activity: Not on file  Stress: Not on file  Social Connections: Not on file  Intimate Partner Violence: Not on file    FAMILY HISTORY: Family History  Problem Relation Age of Onset   Healthy Daughter     ALLERGIES:  is allergic to alendronate, allopurinol, amoxicillin-pot clavulanate, folic acid, methotrexate derivatives, latex, and tramadol.  MEDICATIONS:  Current Outpatient Medications  Medication Sig Dispense Refill   aspirin 81 MG tablet Take 81 mg by mouth daily.     atorvastatin (LIPITOR) 20 MG tablet TAKE 1 TABLET BY MOUTH  DAILY 90 tablet 3   clotrimazole-betamethasone (LOTRISONE) cream Apply 1 application topically 2 (two) times daily. 45 g 2   esomeprazole (NEXIUM)  40 MG capsule TAKE 1 CAPSULE BY MOUTH  DAILY IN THE MORNING 90 capsule 3   hydroxyurea (HYDREA) 500 MG capsule Take 2 capsules (1,000 mg total) by mouth daily. May take with food to minimize GI side effects. 120 capsule 3   mupirocin ointment (BACTROBAN) 2 % APPLY TOPICALLY TO THE AFFECTED AREA TWICE DAILY AS NEEDED 22 g 11   NEOMYCIN-POLYMYXIN-HYDROCORTISONE (CORTISPORIN) 1 % SOLN OTIC solution Place 3 drops into the left ear 4 (four) times daily. 10 mL 0   polyethylene glycol powder (GLYCOLAX/MIRALAX) powder MIX 1 CAPFUL IN 8 OUNCES OF WATER AND DRINK ONCE DAILY AS DIRECTED 527 g 0   No current facility-administered medications for this visit.    REVIEW OF SYSTEMS:   . Lab Results  Component Value Date   IRON 106 03/07/2021   TIBC 318 03/07/2021   IRONPCTSAT 33 (H) 03/07/2021   (Iron and TIBC)  Lab Results  Component Value Date   FERRITIN 550 (H) 03/07/2021     PHYSICAL EXAMINATION: Telehealth Visit.    LABORATORY DATA:  I have reviewed the data as  listed . CBC Latest Ref Rng & Units 03/07/2021 02/08/2021 01/10/2021  WBC 4.0 - 10.5 K/uL 8.1 6.1 7.5  Hemoglobin 12.0 - 15.0 g/dL 11.1(L) 10.5(L) 11.3(L)  Hematocrit 36.0 - 46.0 % 33.0(L) 32.2(L) 35.0(L)  Platelets 150 - 400 K/uL 905(HH) 829(H) 1,281(HH)    . CMP Latest Ref Rng & Units 03/07/2021 02/08/2021 01/10/2021  Glucose 70 - 99 mg/dL 79 84 94  BUN 8 - 23 mg/dL 23 19 22   Creatinine 0.44 - 1.00 mg/dL 1.00 0.86 1.03(H)  Sodium 135 - 145 mmol/L 138 141 142  Potassium 3.5 - 5.1 mmol/L 3.5 3.6 3.8  Chloride 98 - 111 mmol/L 103 107 108  CO2 22 - 32 mmol/L 28 25 26   Calcium 8.9 - 10.3 mg/dL 9.1 9.3 9.3  Total Protein 6.5 - 8.1 g/dL 6.9 6.8 6.5  Total Bilirubin 0.3 - 1.2 mg/dL 0.7 0.4 0.4  Alkaline Phos 38 - 126 U/L 52 58 55  AST 15 - 41 U/L 33 28 29  ALT 0 - 44 U/L 41 20 33       RADIOGRAPHIC STUDIES: I have personally reviewed the radiological images as listed and agreed with the findings in the report. No results found.  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 -No new symptoms to suggest VTE or arterial thrombosis.  No bleeding issues. -recommended she increase hydroxyurea to  2 capsules  [1000 mg] orally} -Continue 2000 IU Vitamin D daily. -Continue Aspirin, B-complex.  FOLLOW UP:  Return to clinic with Dr. Irene Limbo with labs in 8 weeks  . The total time spent in the appointment was 20 minutes and more than 50% was on counseling and direct patient cares.   All of the patient's questions were answered with apparent satisfaction. The patient knows to call the clinic with any problems, questions or concerns.    [place] Lone MD [place] AAHIVMS [facility] Hematology/Oncology Physician [facility]

## 2021-03-11 ENCOUNTER — Telehealth: Payer: Self-pay | Admitting: Hematology

## 2021-03-11 NOTE — Telephone Encounter (Signed)
Scheduled follow-up appointment per 10/14 los. Patient's daughter is aware.

## 2021-03-14 ENCOUNTER — Encounter: Payer: Self-pay | Admitting: Hematology

## 2021-04-03 DIAGNOSIS — H35363 Drusen (degenerative) of macula, bilateral: Secondary | ICD-10-CM | POA: Diagnosis not present

## 2021-04-03 DIAGNOSIS — H524 Presbyopia: Secondary | ICD-10-CM | POA: Diagnosis not present

## 2021-04-03 DIAGNOSIS — H402222 Chronic angle-closure glaucoma, left eye, moderate stage: Secondary | ICD-10-CM | POA: Diagnosis not present

## 2021-04-03 DIAGNOSIS — H402211 Chronic angle-closure glaucoma, right eye, mild stage: Secondary | ICD-10-CM | POA: Diagnosis not present

## 2021-04-03 DIAGNOSIS — H26493 Other secondary cataract, bilateral: Secondary | ICD-10-CM | POA: Diagnosis not present

## 2021-04-03 LAB — HM DIABETES EYE EXAM

## 2021-04-29 DIAGNOSIS — M059 Rheumatoid arthritis with rheumatoid factor, unspecified: Secondary | ICD-10-CM | POA: Diagnosis not present

## 2021-04-29 DIAGNOSIS — Z79899 Other long term (current) drug therapy: Secondary | ICD-10-CM | POA: Diagnosis not present

## 2021-04-29 DIAGNOSIS — E790 Hyperuricemia without signs of inflammatory arthritis and tophaceous disease: Secondary | ICD-10-CM | POA: Diagnosis not present

## 2021-04-29 DIAGNOSIS — M7989 Other specified soft tissue disorders: Secondary | ICD-10-CM | POA: Diagnosis not present

## 2021-04-29 DIAGNOSIS — I10 Essential (primary) hypertension: Secondary | ICD-10-CM | POA: Diagnosis not present

## 2021-04-29 DIAGNOSIS — D473 Essential (hemorrhagic) thrombocythemia: Secondary | ICD-10-CM | POA: Diagnosis not present

## 2021-04-29 DIAGNOSIS — M1990 Unspecified osteoarthritis, unspecified site: Secondary | ICD-10-CM | POA: Diagnosis not present

## 2021-04-29 DIAGNOSIS — M810 Age-related osteoporosis without current pathological fracture: Secondary | ICD-10-CM | POA: Diagnosis not present

## 2021-05-02 ENCOUNTER — Other Ambulatory Visit: Payer: Self-pay

## 2021-05-02 DIAGNOSIS — D473 Essential (hemorrhagic) thrombocythemia: Secondary | ICD-10-CM

## 2021-05-03 ENCOUNTER — Telehealth: Payer: Self-pay

## 2021-05-03 ENCOUNTER — Inpatient Hospital Stay: Payer: Medicare Other

## 2021-05-03 ENCOUNTER — Other Ambulatory Visit: Payer: Self-pay

## 2021-05-03 ENCOUNTER — Inpatient Hospital Stay: Payer: Medicare Other | Attending: Hematology | Admitting: Hematology

## 2021-05-03 VITALS — BP 170/71 | HR 70 | Temp 97.9°F | Resp 18 | Wt 145.8 lb

## 2021-05-03 DIAGNOSIS — D649 Anemia, unspecified: Secondary | ICD-10-CM | POA: Diagnosis not present

## 2021-05-03 DIAGNOSIS — D473 Essential (hemorrhagic) thrombocythemia: Secondary | ICD-10-CM | POA: Insufficient documentation

## 2021-05-03 DIAGNOSIS — M069 Rheumatoid arthritis, unspecified: Secondary | ICD-10-CM | POA: Insufficient documentation

## 2021-05-03 LAB — RETIC PANEL
Immature Retic Fract: 32 % — ABNORMAL HIGH (ref 2.3–15.9)
RBC.: 2.89 MIL/uL — ABNORMAL LOW (ref 3.87–5.11)
Retic Count, Absolute: 52.9 10*3/uL (ref 19.0–186.0)
Retic Ct Pct: 1.8 % (ref 0.4–3.1)
Reticulocyte Hemoglobin: 40.2 pg (ref >27.9)

## 2021-05-03 LAB — CBC WITH DIFFERENTIAL (CANCER CENTER ONLY)
Abs Immature Granulocytes: 0.03 10*3/uL (ref 0.00–0.07)
Basophils Absolute: 0 10*3/uL (ref 0.0–0.1)
Basophils Relative: 1 %
Eosinophils Absolute: 0 10*3/uL (ref 0.0–0.5)
Eosinophils Relative: 1 %
HCT: 34.1 % — ABNORMAL LOW (ref 36.0–46.0)
Hemoglobin: 11.3 g/dL — ABNORMAL LOW (ref 12.0–15.0)
Immature Granulocytes: 0 %
Lymphocytes Relative: 22 %
Lymphs Abs: 1.8 10*3/uL (ref 0.7–4.0)
MCH: 38 pg — ABNORMAL HIGH (ref 26.0–34.0)
MCHC: 33.1 g/dL (ref 30.0–36.0)
MCV: 114.8 fL — ABNORMAL HIGH (ref 80.0–100.0)
Monocytes Absolute: 0.8 10*3/uL (ref 0.1–1.0)
Monocytes Relative: 9 %
Neutro Abs: 5.6 10*3/uL (ref 1.7–7.7)
Neutrophils Relative %: 67 %
Platelet Count: 1070 10*3/uL (ref 150–400)
RBC: 2.97 MIL/uL — ABNORMAL LOW (ref 3.87–5.11)
RDW: 19.2 % — ABNORMAL HIGH (ref 11.5–15.5)
WBC Count: 8.3 10*3/uL (ref 4.0–10.5)
nRBC: 0.5 % — ABNORMAL HIGH (ref 0.0–0.2)

## 2021-05-03 LAB — CMP (CANCER CENTER ONLY)
ALT: 22 U/L (ref 0–44)
AST: 30 U/L (ref 15–41)
Albumin: 3.8 g/dL (ref 3.5–5.0)
Alkaline Phosphatase: 51 U/L (ref 38–126)
Anion gap: 9 (ref 5–15)
BUN: 19 mg/dL (ref 8–23)
CO2: 25 mmol/L (ref 22–32)
Calcium: 9 mg/dL (ref 8.9–10.3)
Chloride: 105 mmol/L (ref 98–111)
Creatinine: 1.01 mg/dL — ABNORMAL HIGH (ref 0.44–1.00)
GFR, Estimated: 56 mL/min — ABNORMAL LOW (ref >60)
Glucose, Bld: 85 mg/dL (ref 70–99)
Potassium: 4.1 mmol/L (ref 3.5–5.1)
Sodium: 139 mmol/L (ref 135–145)
Total Bilirubin: 0.4 mg/dL (ref 0.3–1.2)
Total Protein: 7 g/dL (ref 6.5–8.1)

## 2021-05-03 LAB — FERRITIN: Ferritin: 529 ng/mL — ABNORMAL HIGH (ref 11–307)

## 2021-05-03 LAB — IRON AND TIBC
Iron: 94 ug/dL (ref 41–142)
Saturation Ratios: 33 % (ref 21–57)
TIBC: 284 ug/dL (ref 236–444)
UIBC: 190 ug/dL (ref 120–384)

## 2021-05-03 NOTE — Progress Notes (Signed)
HEMATOLOGY/ONCOLOGY CLINIC NOTE  Date of Service: .05/03/2021   Patient Care Team: [name], MD as PCP - General Clarene Essex, MD as Consulting Physician (Gastroenterology) Valinda Party, MD (Rheumatology)  CHIEF COMPLAINTS/PURPOSE OF CONSULTATION:  Follow-up for continued management of essential thrombocytosis  HISTORY OF PRESENTING ILLNESS:  Previous notes for details on initial presentation  INTERVAL HISTORY:  [name] is here for continued evaluation and management of her essential thrombocytosis . She notes no acute new symptoms since her last clinic visit with [place] 03/08/2021.  Has been following with Dr. Dossie Der for management of her rheumatoid arthritis and is currently on prednisone.  She notes no significant uncontrolled joint pain or swelling from her rheumatoid arthritis. Patient notes no chest pain, shortness of breath, new leg swelling or strokelike symptoms. She notes that she has been compliant with her hydroxyurea and aspirin.  Labs done today reviewed with the patient. No infection issues.  MEDICAL HISTORY:  Past Medical History:  Diagnosis Date   Arthritis    Cervical stenosis of spine    Dysphasia    Essential hypertension, benign    GERD (gastroesophageal reflux disease)    Glaucoma    Heart murmur    History of estrogen therapy    Hypercholesteremia    IBS (irritable bowel syndrome)    Mitral regurgitation    Pulmonary air embolism (HCC)    states she had this wiht her back surgery   Rheumatoid arthritis ([place])    Seborrhea    patient denies    SURGICAL HISTORY: Past Surgical History:  Procedure Laterality Date   ANTERIOR AND POSTERIOR REPAIR WITH SACROSPINOUS FIXATION N/A 01/08/2015   Procedure: ANTERIOR AND POSTERIOR REPAIR WITH possible SACROSPINOUS FIXATION;  Surgeon: Molli Posey, MD;  Location: [facility];  Service: Gynecology;  Laterality: N/A;   BACK SURGERY     CATARACT EXTRACTION W/ INTRAOCULAR LENS  IMPLANT, BILATERAL  Bilateral    COLONOSCOPY     EYE SURGERY     LAPAROSCOPIC ASSISTED VAGINAL HYSTERECTOMY N/A 01/08/2015   Procedure: LAPAROSCOPIC ASSISTED VAGINAL HYSTERECTOMY;  Surgeon: Molli Posey, MD;  Location: [facility];  Service: Gynecology;  Laterality: N/A;   LUMBAR FUSION  05/2010   REVERSE SHOULDER ARTHROPLASTY Left 02/16/2014   Procedure: LEFT REVERSE TOTAL SHOULDER ARTHROPLASTY;  Surgeon: Marin Shutter, MD;  Location: [facility];  Service: Orthopedics;  Laterality: Left;   REVERSE SHOULDER ARTHROPLASTY Right 08/07/2016   REVERSE SHOULDER ARTHROPLASTY Right 08/07/2016   Procedure: REVERSE RIGHT SHOULDER ARTHROPLASTY;  Surgeon: Justice Britain, MD;  Location: [facility];  Service: Orthopedics;  Laterality: Right;   TOE SURGERY     TUBAL LIGATION      SOCIAL HISTORY: Social History   Socioeconomic History   Marital status: Widowed    Spouse name: Not on file   Number of children: 9   Years of education: 7   Highest education level: Not on file  Occupational History   Not on file  Tobacco Use   Smoking status: Never   Smokeless tobacco: Never  Substance and Sexual Activity   Alcohol use: No   Drug use: No   Sexual activity: Never  Other Topics Concern   Not on file  Social History Narrative   Lives with daughter   Caffeine use: none   Social Determinants of Health   Financial Resource Strain: Not on file  Food Insecurity: Not on file  Transportation Needs: Not on file  Physical Activity: Not on file  Stress: Not  on file  Social Connections: Not on file  Intimate Partner Violence: Not on file    FAMILY HISTORY: Family History  Problem Relation Age of Onset   Healthy Daughter     ALLERGIES:  is allergic to alendronate, allopurinol, amoxicillin-pot clavulanate, folic acid, methotrexate derivatives, latex, and tramadol.  MEDICATIONS:  Current Outpatient Medications  Medication Sig Dispense Refill   aspirin 81 MG tablet Take 81 mg by mouth daily.     atorvastatin (LIPITOR) 20 MG  tablet TAKE 1 TABLET BY MOUTH  DAILY 90 tablet 3   clotrimazole-betamethasone (LOTRISONE) cream Apply 1 application topically 2 (two) times daily. 45 g 2   esomeprazole (NEXIUM) 40 MG capsule TAKE 1 CAPSULE BY MOUTH  DAILY IN THE MORNING 90 capsule 3   hydroxyurea (HYDREA) 500 MG capsule Take 2 capsules (1,000 mg total) by mouth daily. May take with food to minimize GI side effects. 120 capsule 3   mupirocin ointment (BACTROBAN) 2 % APPLY TOPICALLY TO THE AFFECTED AREA TWICE DAILY AS NEEDED 22 g 11   NEOMYCIN-POLYMYXIN-HYDROCORTISONE (CORTISPORIN) 1 % SOLN OTIC solution Place 3 drops into the left ear 4 (four) times daily. 10 mL 0   polyethylene glycol powder (GLYCOLAX/MIRALAX) powder MIX 1 CAPFUL IN 8 OUNCES OF WATER AND DRINK ONCE DAILY AS DIRECTED 527 g 0   No current facility-administered medications for this visit.    REVIEW OF SYSTEMS:   .Marland Kitchen10 Point review of Systems was done is negative except as noted above.  PHYSICAL EXAMINATION: .BP (!) 170/71   Pulse 70   Temp 97.9 F (36.6 C)   Resp 18   Wt 145 lb 12.8 oz (66.1 kg)   SpO2 100%   BMI 28.96 kg/m  . GENERAL:alert, in no acute distress and comfortable SKIN: no acute rashes, no significant lesions EYES: conjunctiva are pink and non-injected, sclera anicteric OROPHARYNX: MMM, no exudates, no oropharyngeal erythema or ulceration NECK: supple, no JVD LYMPH:  no palpable lymphadenopathy in the cervical, axillary or inguinal regions LUNGS: clear to auscultation b/l with normal respiratory effort HEART: regular rate & rhythm ABDOMEN:  normoactive bowel sounds , non tender, not distended. Extremity: no pedal edema PSYCH: alert & oriented x 3 with fluent speech NEURO: no focal motor/sensory deficits    LABORATORY DATA:  I have reviewed the data as listed . CBC Latest Ref Rng & Units 05/03/2021 03/07/2021 02/08/2021  WBC 4.0 - 10.5 K/uL 8.3 8.1 6.1  Hemoglobin 12.0 - 15.0 g/dL 11.3(L) 11.1(L) 10.5(L)  Hematocrit 36.0 - 46.0 %  34.1(L) 33.0(L) 32.2(L)  Platelets 150 - 400 K/uL 1,070(HH) 905(HH) 829(H)    CMP Latest Ref Rng & Units 05/03/2021 03/07/2021 02/08/2021  Glucose 70 - 99 mg/dL 85 79 84  BUN 8 - 23 mg/dL 19 23 19   Creatinine 0.44 - 1.00 mg/dL 1.01(H) 1.00 0.86  Sodium 135 - 145 mmol/L 139 138 141  Potassium 3.5 - 5.1 mmol/L 4.1 3.5 3.6  Chloride 98 - 111 mmol/L 105 103 107  CO2 22 - 32 mmol/L 25 28 25   Calcium 8.9 - 10.3 mg/dL 9.0 9.1 9.3  Total Protein 6.5 - 8.1 g/dL 7.0 6.9 6.8  Total Bilirubin 0.3 - 1.2 mg/dL 0.4 0.7 0.4  Alkaline Phos 38 - 126 U/L 51 52 58  AST 15 - 41 U/L 30 33 28  ALT 0 - 44 U/L 22 41 20       RADIOGRAPHIC STUDIES: I have personally reviewed the radiological images as listed and agreed with the findings  in the report. No results found.  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 CMP stable Ferritin of 529 with an iron saturation of 33%. -Patient notes that she has been compliant with her current dose of hydroxyurea of 1000 mg p.o. daily and has not had any notable toxicities from this. -Her platelet count is still very elevated and not controlled.  Increased the dose of hydroxyurea to 3 capsules (1500 mg) on Monday and Friday and 2 capsules (1000mg ) rest of the days of the week. -Initial goal is to try to bring the platelets below 600k without causing much anemia or leukopenia, -Continue to optimize medical arthritis treatment with rheumatologist to reduce any reactive component to her thrombocytosis. -Continue 2000 IU Vitamin D daily. -Continue Aspirin, B-complex.  FOLLOW UP:  Phone visit with Dr. Irene Limbo in 4 weeks with labs 1 day prior Return to clinic  with Dr. Irene Limbo with labs in 8 weeks  All of the patient's questions were answered with apparent satisfaction. The patient knows to call the clinic with any problems, questions or concerns.    [place] Lone MD [place] AAHIVMS [facility] Hematology/Oncology Physician [facility]

## 2021-05-03 NOTE — Telephone Encounter (Signed)
CRITICAL VALUE STICKER  CRITICAL VALUE: PLT = [phone],000  RECEIVER (on-site recipient of call): Yetta Glassman, [place] NOTIFIED: 05/03/21 at 9:11am  MESSENGER (representative from lab): Ulice Dash  MD NOTIFIED: Irene Limbo  TIME OF NOTIFICATION: 05/03/21 at 9:15am  RESPONSE: Notification given to Ilda Foil., RN for follow-up with provider and pt.

## 2021-05-07 ENCOUNTER — Telehealth: Payer: Self-pay | Admitting: Hematology

## 2021-05-07 NOTE — Telephone Encounter (Signed)
Scheduled follow-up appointments per 12/9 los. Patient's daughter is aware.

## 2021-05-09 ENCOUNTER — Encounter: Payer: Self-pay | Admitting: Hematology

## 2021-05-09 MED ORDER — HYDROXYUREA 500 MG PO CAPS: ORAL_CAPSULE | ORAL | 3 refills | Status: DC

## 2021-05-29 ENCOUNTER — Other Ambulatory Visit: Payer: Self-pay

## 2021-05-29 DIAGNOSIS — D473 Essential (hemorrhagic) thrombocythemia: Secondary | ICD-10-CM

## 2021-05-30 ENCOUNTER — Inpatient Hospital Stay: Payer: Medicare Other | Attending: Hematology

## 2021-05-30 ENCOUNTER — Other Ambulatory Visit: Payer: Self-pay

## 2021-05-30 DIAGNOSIS — Z7982 Long term (current) use of aspirin: Secondary | ICD-10-CM | POA: Insufficient documentation

## 2021-05-30 DIAGNOSIS — D473 Essential (hemorrhagic) thrombocythemia: Secondary | ICD-10-CM | POA: Diagnosis not present

## 2021-05-30 DIAGNOSIS — Z79899 Other long term (current) drug therapy: Secondary | ICD-10-CM | POA: Insufficient documentation

## 2021-05-30 LAB — CBC WITH DIFFERENTIAL (CANCER CENTER ONLY)
Abs Immature Granulocytes: 0.05 10*3/uL (ref 0.00–0.07)
Basophils Absolute: 0 10*3/uL (ref 0.0–0.1)
Basophils Relative: 0 %
Eosinophils Absolute: 0.1 10*3/uL (ref 0.0–0.5)
Eosinophils Relative: 1 %
HCT: 31.5 % — ABNORMAL LOW (ref 36.0–46.0)
Hemoglobin: 10.7 g/dL — ABNORMAL LOW (ref 12.0–15.0)
Immature Granulocytes: 1 %
Lymphocytes Relative: 23 %
Lymphs Abs: 2.2 10*3/uL (ref 0.7–4.0)
MCH: 39.9 pg — ABNORMAL HIGH (ref 26.0–34.0)
MCHC: 34 g/dL (ref 30.0–36.0)
MCV: 117.5 fL — ABNORMAL HIGH (ref 80.0–100.0)
Monocytes Absolute: 0.9 10*3/uL (ref 0.1–1.0)
Monocytes Relative: 9 %
Neutro Abs: 6.3 10*3/uL (ref 1.7–7.7)
Neutrophils Relative %: 66 %
Platelet Count: 858 10*3/uL — ABNORMAL HIGH (ref 150–400)
RBC: 2.68 MIL/uL — ABNORMAL LOW (ref 3.87–5.11)
RDW: 16.5 % — ABNORMAL HIGH (ref 11.5–15.5)
WBC Count: 9.5 10*3/uL (ref 4.0–10.5)
nRBC: 0.4 % — ABNORMAL HIGH (ref 0.0–0.2)

## 2021-05-30 LAB — CMP (CANCER CENTER ONLY)
ALT: 36 U/L (ref 0–44)
AST: 36 U/L (ref 15–41)
Albumin: 3.7 g/dL (ref 3.5–5.0)
Alkaline Phosphatase: 45 U/L (ref 38–126)
Anion gap: 5 (ref 5–15)
BUN: 21 mg/dL (ref 8–23)
CO2: 30 mmol/L (ref 22–32)
Calcium: 9 mg/dL (ref 8.9–10.3)
Chloride: 104 mmol/L (ref 98–111)
Creatinine: 1.04 mg/dL — ABNORMAL HIGH (ref 0.44–1.00)
GFR, Estimated: 54 mL/min — ABNORMAL LOW (ref >60)
Glucose, Bld: 109 mg/dL — ABNORMAL HIGH (ref 70–99)
Potassium: 3.8 mmol/L (ref 3.5–5.1)
Sodium: 139 mmol/L (ref 135–145)
Total Bilirubin: 0.4 mg/dL (ref 0.3–1.2)
Total Protein: 6.4 g/dL — ABNORMAL LOW (ref 6.5–8.1)

## 2021-05-30 LAB — RETICULOCYTES
Immature Retic Fract: 34.8 % — ABNORMAL HIGH (ref 2.3–15.9)
RBC.: 2.63 MIL/uL — ABNORMAL LOW (ref 3.87–5.11)
Retic Count, Absolute: 46.6 10*3/uL (ref 19.0–186.0)
Retic Ct Pct: 1.8 % (ref 0.4–3.1)

## 2021-05-30 LAB — FERRITIN: Ferritin: 578 ng/mL — ABNORMAL HIGH (ref 11–307)

## 2021-05-30 LAB — IRON AND IRON BINDING CAPACITY (CC-WL,HP ONLY)
Iron: 80 ug/dL (ref 28–170)
Saturation Ratios: 30 % (ref 10.4–31.8)
TIBC: 263 ug/dL (ref 250–450)
UIBC: 183 ug/dL (ref 148–442)

## 2021-05-31 ENCOUNTER — Inpatient Hospital Stay (HOSPITAL_BASED_OUTPATIENT_CLINIC_OR_DEPARTMENT_OTHER): Payer: Medicare Other | Admitting: Hematology

## 2021-05-31 DIAGNOSIS — D473 Essential (hemorrhagic) thrombocythemia: Secondary | ICD-10-CM | POA: Diagnosis not present

## 2021-05-31 DIAGNOSIS — Z79899 Other long term (current) drug therapy: Secondary | ICD-10-CM | POA: Diagnosis not present

## 2021-05-31 DIAGNOSIS — Z7982 Long term (current) use of aspirin: Secondary | ICD-10-CM | POA: Diagnosis not present

## 2021-06-03 ENCOUNTER — Telehealth: Payer: Self-pay | Admitting: Hematology

## 2021-06-03 NOTE — Telephone Encounter (Signed)
Left message with rescheduled upcoming appointment per 1/6 los.

## 2021-06-05 ENCOUNTER — Telehealth: Payer: Self-pay | Admitting: Internal Medicine

## 2021-06-05 NOTE — Telephone Encounter (Signed)
Janique Hoefer [phone]  atorvastatin (LIPITOR) 20 MG tablet esomeprazole (NEXIUM) 40 MG capsule  Zamani called to say she needs refills on the above medication sent to pharmacy, but she also said they told her to contact her doctor about a credit card to pay for them. She does not have any credit cards, she has sent them a money order in the past.    [employer], [employer]film[employer]video ([place]) - [place], [place]  [address] [place] [address], [place] [address]  Phone:  [phone]  Fax:  [phone]

## 2021-06-06 ENCOUNTER — Encounter: Payer: Self-pay | Admitting: Hematology

## 2021-06-06 NOTE — Progress Notes (Addendum)
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   I discussed the limitations, risks, security and privacy concerns of performing an evaluation and management service by telemedicine and the availability of in-person appointments. I also discussed with the patient that there may be a patient responsible charge related to this service. The patient expressed understanding and agreed to proceed.   Other persons participating in the visit and their role in the encounter: None  Patients location: Home Providers location: [place]  Chief Complaint: Follow-up for lab review of her essential thrombocytosis.  Patient was called to review her labs and perform toxicity assessment since her hydroxyurea dose was increased with her last clinic visit 4 weeks ago. She notes no new toxicities from oral hydroxyurea.  No new symptoms suggestive of VTE MI or CVA. No bleeding issues on. Labs done on 05/30/2021 reviewed with her in detail on the phone. CBC shows improvement in her platelet count which is down from 1070k to 858k hemoglobin is 10.7 with a WBC count of 9.5k CMP stable Ferritin 578 with an iron saturation of 30%. No other acute new focal symptoms  MEDICAL HISTORY:  Past Medical History:  Diagnosis Date   Arthritis    Cervical stenosis of spine    Dysphasia    Essential hypertension, benign    GERD  (gastroesophageal reflux disease)    Glaucoma    Heart murmur    History of estrogen therapy    Hypercholesteremia    IBS (irritable bowel syndrome)    Mitral regurgitation    Pulmonary air embolism (HCC)    states she had this wiht her back surgery   Rheumatoid arthritis ([place])    Seborrhea    patient denies    SURGICAL HISTORY: Past Surgical History:  Procedure Laterality Date   ANTERIOR AND POSTERIOR REPAIR WITH SACROSPINOUS FIXATION N/A 01/08/2015   Procedure: ANTERIOR AND POSTERIOR REPAIR WITH possible SACROSPINOUS FIXATION;  Surgeon: Molli Posey, MD;  Location: [facility];  Service: Gynecology;  Laterality: N/A;   BACK SURGERY     CATARACT EXTRACTION W/ INTRAOCULAR LENS  IMPLANT, BILATERAL Bilateral    COLONOSCOPY     EYE SURGERY     LAPAROSCOPIC ASSISTED VAGINAL HYSTERECTOMY N/A 01/08/2015   Procedure: LAPAROSCOPIC ASSISTED VAGINAL HYSTERECTOMY;  Surgeon: Molli Posey, MD;  Location: [facility];  Service: Gynecology;  Laterality: N/A;   LUMBAR FUSION  05/2010   REVERSE SHOULDER ARTHROPLASTY Left 02/16/2014   Procedure: LEFT REVERSE TOTAL SHOULDER ARTHROPLASTY;  Surgeon: Marin Shutter, MD;  Location: [facility];  Service: Orthopedics;  Laterality: Left;   REVERSE SHOULDER ARTHROPLASTY Right 08/07/2016   REVERSE SHOULDER ARTHROPLASTY Right 08/07/2016   Procedure: REVERSE RIGHT SHOULDER ARTHROPLASTY;  Surgeon: Justice Britain, MD;  Location: [facility];  Service: Orthopedics;  Laterality: Right;   TOE SURGERY     TUBAL LIGATION      SOCIAL HISTORY: Social History   Socioeconomic History   Marital status: Widowed    Spouse name: Not on  file   Number of children: 9   Years of education: 7   Highest education level: Not on file  Occupational History   Not on file  Tobacco Use   Smoking status: Never   Smokeless tobacco: Never  Substance and Sexual Activity   Alcohol use: No   Drug use: No   Sexual activity: Never  Other Topics Concern   Not on file  Social History Narrative    Lives with daughter   Caffeine use: none   Social Determinants of Health   Financial Resource Strain: Not on file  Food Insecurity: Not on file  Transportation Needs: Not on file  Physical Activity: Not on file  Stress: Not on file  Social Connections: Not on file  Intimate Partner Violence: Not on file    FAMILY HISTORY: Family History  Problem Relation Age of Onset   Healthy Daughter     ALLERGIES:  is allergic to alendronate, allopurinol, amoxicillin-pot clavulanate, folic acid, methotrexate derivatives, latex, and tramadol.  MEDICATIONS:  Current Outpatient Medications  Medication Sig Dispense Refill   aspirin 81 MG tablet Take 81 mg by mouth daily.     atorvastatin (LIPITOR) 20 MG tablet TAKE 1 TABLET BY MOUTH  DAILY 90 tablet 3   clotrimazole-betamethasone (LOTRISONE) cream Apply 1 application topically 2 (two) times daily. 45 g 2   esomeprazole (NEXIUM) 40 MG capsule TAKE 1 CAPSULE BY MOUTH  DAILY IN THE MORNING 90 capsule 3   hydroxyurea (HYDREA) 500 MG capsule Take 1500mg  po on Monday and Friday and 1000mg  po on Tuesday/Thursday/Saturday and Sunday. May take with food to minimize GI side effects. 120 capsule 3   mupirocin ointment (BACTROBAN) 2 % APPLY TOPICALLY TO THE AFFECTED AREA TWICE DAILY AS NEEDED 22 g 11   NEOMYCIN-POLYMYXIN-HYDROCORTISONE (CORTISPORIN) 1 % SOLN OTIC solution Place 3 drops into the left ear 4 (four) times daily. 10 mL 0   polyethylene glycol powder (GLYCOLAX/MIRALAX) powder MIX 1 CAPFUL IN 8 OUNCES OF WATER AND DRINK ONCE DAILY AS DIRECTED 527 g 0   No current facility-administered medications for this visit.    REVIEW OF SYSTEMS:   .10 Point review of Systems was done is negative except as noted above.   PHYSICAL EXAMINATION: Telemedicine visit  LABORATORY DATA:  I have reviewed the data as listed . CBC Latest Ref Rng & Units 05/30/2021 05/03/2021 03/07/2021  WBC 4.0 - 10.5 K/uL 9.5 8.3 8.1  Hemoglobin 12.0 - 15.0 g/dL 10.7(L) 11.3(L)  11.1(L)  Hematocrit 36.0 - 46.0 % 31.5(L) 34.1(L) 33.0(L)  Platelets 150 - 400 K/uL 858(H) 1,070(HH) 905(HH)    CMP Latest Ref Rng & Units 05/30/2021 05/03/2021 03/07/2021  Glucose 70 - 99 mg/dL 109(H) 85 79  BUN 8 - 23 mg/dL 21 19 23   Creatinine 0.44 - 1.00 mg/dL 1.04(H) 1.01(H) 1.00  Sodium 135 - 145 mmol/L 139 139 138  Potassium 3.5 - 5.1 mmol/L 3.8 4.1 3.5  Chloride 98 - 111 mmol/L 104 105 103  CO2 22 - 32 mmol/L 30 25 28   Calcium 8.9 - 10.3 mg/dL 9.0 9.0 9.1  Total Protein 6.5 - 8.1 g/dL 6.4(L) 7.0 6.9  Total Bilirubin 0.3 - 1.2 mg/dL 0.4 0.4 0.7  Alkaline Phos 38 - 126 U/L 45 51 52  AST 15 - 41 U/L 36 30 33  ALT 0 - 44 U/L 36 22 41       RADIOGRAPHIC STUDIES: I have personally reviewed the radiological images as listed and agreed with the findings in  the report. No results found.  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  Her platelet counts are trending downwards with increased dose of Hydrea. Labs done on 05/30/2021 reviewed with her in detail on the phone. CBC shows improvement in her platelet count which is down from 1070k to 858k hemoglobin is 10.7 with a WBC count of 9.5k CMP stable Ferritin 578 with an iron saturation of 30%. No other acute new focal symptoms -No further toxicities we will continue hydroxyurea to 3 capsules (1500 mg) on Monday and Friday and 2 capsules (1000mg ) rest of the days of the week. -Initial goal is to try to bring the platelets below 600k without causing much anemia or leukopenia. -Given Calreticulin mutated essential thrombocytosis has a slightly lower risk of VTE and the patient has anemia due to hydroxyurea and her rheumatoid arthritis we shall keep the same dose of Hydrea and only  conservatively increase the dose at this time in order not to worsen her anemia.  -Continue to optimize rheumatoid arthritis treatment with rheumatologist to reduce any reactive component to her thrombocytosis. -Continue 2000 IU Vitamin D daily. -Continue Aspirin, B-complex.  FOLLOW UP:  Please change lab and clinic visit from 06/28/2021 out additional 4 weeks.  All of the patient's questions were answered with apparent satisfaction. The patient knows to call the clinic with any problems, questions or concerns.    Sullivan Lone MD [place] [facility] Hematology/Oncology Physician [facility]

## 2021-06-09 ENCOUNTER — Other Ambulatory Visit: Payer: Self-pay | Admitting: Internal Medicine

## 2021-06-10 NOTE — Telephone Encounter (Signed)
Meds sent in and patient is aware we cannot help with cc issues.

## 2021-06-10 NOTE — Addendum Note (Signed)
Addended by: Angus Seller on: 06/10/2021 02:32 PM   Modules accepted: Orders

## 2021-06-27 ENCOUNTER — Other Ambulatory Visit: Payer: Medicare Other | Admitting: Internal Medicine

## 2021-06-27 ENCOUNTER — Other Ambulatory Visit: Payer: Self-pay

## 2021-06-27 DIAGNOSIS — E7800 Pure hypercholesterolemia, unspecified: Secondary | ICD-10-CM

## 2021-06-27 DIAGNOSIS — Z1329 Encounter for screening for other suspected endocrine disorder: Secondary | ICD-10-CM | POA: Diagnosis not present

## 2021-06-27 DIAGNOSIS — E1169 Type 2 diabetes mellitus with other specified complication: Secondary | ICD-10-CM

## 2021-06-27 DIAGNOSIS — I10 Essential (primary) hypertension: Secondary | ICD-10-CM

## 2021-06-28 ENCOUNTER — Ambulatory Visit: Payer: Medicare Other | Admitting: Hematology

## 2021-06-28 ENCOUNTER — Other Ambulatory Visit: Payer: Medicare Other

## 2021-06-28 LAB — COMPLETE METABOLIC PANEL WITH GFR
AG Ratio: 1.5 (calc) (ref 1.0–2.5)
ALT: 24 U/L (ref 6–29)
AST: 29 U/L (ref 10–35)
Albumin: 4.2 g/dL (ref 3.6–5.1)
Alkaline phosphatase (APISO): 48 U/L (ref 37–153)
BUN: 20 mg/dL (ref 7–25)
CO2: 27 mmol/L (ref 20–32)
Calcium: 9.9 mg/dL (ref 8.6–10.4)
Chloride: 100 mmol/L (ref 98–110)
Creat: 0.95 mg/dL (ref 0.60–0.95)
Globulin: 2.8 g/dL (calc) (ref 1.9–3.7)
Glucose, Bld: 78 mg/dL (ref 65–99)
Potassium: 4.9 mmol/L (ref 3.5–5.3)
Sodium: 137 mmol/L (ref 135–146)
Total Bilirubin: 0.6 mg/dL (ref 0.2–1.2)
Total Protein: 7 g/dL (ref 6.1–8.1)
eGFR: 60 mL/min/{1.73_m2} (ref >=60)

## 2021-06-28 LAB — LIPID PANEL
Cholesterol: 167 mg/dL (ref <200)
HDL: 65 mg/dL (ref >=50)
LDL Cholesterol (Calc): 87 mg/dL (calc)
Non-HDL Cholesterol (Calc): 102 mg/dL (calc) (ref <130)
Total CHOL/HDL Ratio: 2.6 (calc) (ref <5.0)
Triglycerides: 64 mg/dL (ref <150)

## 2021-06-28 LAB — CBC WITH DIFFERENTIAL/PLATELET
Absolute Monocytes: 795 cells/uL (ref 200–950)
Basophils Absolute: 16 cells/uL (ref 0–200)
Basophils Relative: 0.2 %
Eosinophils Absolute: 33 cells/uL (ref 15–500)
Eosinophils Relative: 0.4 %
HCT: 32 % — ABNORMAL LOW (ref 35.0–45.0)
Hemoglobin: 11.4 g/dL — ABNORMAL LOW (ref 11.7–15.5)
Lymphs Abs: 2550 cells/uL (ref 850–3900)
MCH: 41.5 pg — ABNORMAL HIGH (ref 27.0–33.0)
MCHC: 35.6 g/dL (ref 32.0–36.0)
MCV: 116.4 fL — ABNORMAL HIGH (ref 80.0–100.0)
MPV: 9 fL (ref 7.5–12.5)
Monocytes Relative: 9.7 %
Neutro Abs: 4805 cells/uL (ref 1500–7800)
Neutrophils Relative %: 58.6 %
Platelets: 1013 10*3/uL — ABNORMAL HIGH (ref 140–400)
RBC: 2.75 10*6/uL — ABNORMAL LOW (ref 3.80–5.10)
RDW: 15.3 % — ABNORMAL HIGH (ref 11.0–15.0)
Total Lymphocyte: 31.1 %
WBC: 8.2 10*3/uL (ref 3.8–10.8)

## 2021-06-28 LAB — URINALYSIS, MICROSCOPIC ONLY
Bacteria, UA: NONE SEEN /HPF
WBC, UA: NONE SEEN /HPF (ref 0–5)

## 2021-06-28 LAB — HEMOGLOBIN A1C
Hgb A1c MFr Bld: 5.8 % of total Hgb — ABNORMAL HIGH (ref <5.7)
Mean Plasma Glucose: 120 mg/dL
eAG (mmol/L): 6.6 mmol/L

## 2021-06-28 LAB — TSH: TSH: 2.78 mIU/L (ref 0.40–4.50)

## 2021-07-01 ENCOUNTER — Encounter: Payer: Self-pay | Admitting: Internal Medicine

## 2021-07-01 ENCOUNTER — Ambulatory Visit (INDEPENDENT_AMBULATORY_CARE_PROVIDER_SITE_OTHER): Payer: Medicare Other | Admitting: Internal Medicine

## 2021-07-01 ENCOUNTER — Other Ambulatory Visit: Payer: Self-pay

## 2021-07-01 VITALS — BP 132/70 | HR 70 | Temp 98.2°F | Ht 59.0 in | Wt 145.0 lb

## 2021-07-01 DIAGNOSIS — E7800 Pure hypercholesterolemia, unspecified: Secondary | ICD-10-CM

## 2021-07-01 DIAGNOSIS — M059 Rheumatoid arthritis with rheumatoid factor, unspecified: Secondary | ICD-10-CM

## 2021-07-01 DIAGNOSIS — R7302 Impaired glucose tolerance (oral): Secondary | ICD-10-CM | POA: Diagnosis not present

## 2021-07-01 DIAGNOSIS — N1831 Chronic kidney disease, stage 3a: Secondary | ICD-10-CM | POA: Diagnosis not present

## 2021-07-01 DIAGNOSIS — D539 Nutritional anemia, unspecified: Secondary | ICD-10-CM

## 2021-07-01 DIAGNOSIS — R82998 Other abnormal findings in urine: Secondary | ICD-10-CM

## 2021-07-01 DIAGNOSIS — Z0000 Encounter for general adult medical examination without abnormal findings: Secondary | ICD-10-CM | POA: Diagnosis not present

## 2021-07-01 DIAGNOSIS — Z8659 Personal history of other mental and behavioral disorders: Secondary | ICD-10-CM

## 2021-07-01 LAB — POCT URINALYSIS DIPSTICK
Bilirubin, UA: NEGATIVE
Glucose, UA: NEGATIVE
Ketones, UA: NEGATIVE
Nitrite, UA: NEGATIVE
Protein, UA: NEGATIVE
Spec Grav, UA: <=1.005 — AB (ref 1.010–1.025)
Urobilinogen, UA: 0.2 E.U./dL
pH, UA: 5 (ref 5.0–8.0)

## 2021-07-01 NOTE — Progress Notes (Unsigned)
° °  Subjective:    Patient ID: [name], female    DOB: [birth year], 81 y.o.   MRN: [id number]   81 year old Female seen for Medicare wellness and health maintenance exam.  Continues follow up by Dr. Irene Limbo  for thrombocytosis  Lipid panel stable on statin medication.  Sees Dr.Syed for Rheumatoid Arthritis seropositive  in 2013 . Hx osteoporosis  Will have exam next month. Had catarcts removed o.u.  Currently taking Prilosec for dyspepsia  Stage 3a CKD  Review of Systems complaining of neck pain which she has had for sometime.  Hgb runs between 11.3 and 10.5 grams     Objective:   Physical Exam BP 132/70  In August weighed 138 pounds. Now weighs 145 pounds.       Assessment & Plan:

## 2021-07-01 NOTE — Patient Instructions (Addendum)
Cologard done 2020. Will do next year. Labs stable under the care of Dr. Lindi Adie. BP stable. Mammogram due in August.

## 2021-07-02 LAB — URINE CULTURE
MICRO NUMBER:: 12968157
Result:: NO GROWTH
SPECIMEN QUALITY:: ADEQUATE

## 2021-07-02 LAB — MICROALBUMIN, URINE: Microalb, Ur: 0.3 mg/dL

## 2021-07-25 ENCOUNTER — Other Ambulatory Visit: Payer: Self-pay

## 2021-07-25 DIAGNOSIS — D473 Essential (hemorrhagic) thrombocythemia: Secondary | ICD-10-CM

## 2021-07-26 ENCOUNTER — Inpatient Hospital Stay: Payer: Medicare Other | Admitting: Hematology

## 2021-07-26 ENCOUNTER — Inpatient Hospital Stay: Payer: Medicare Other | Attending: Hematology

## 2021-07-26 ENCOUNTER — Other Ambulatory Visit: Payer: Self-pay | Admitting: Hematology

## 2021-07-26 DIAGNOSIS — Z7982 Long term (current) use of aspirin: Secondary | ICD-10-CM | POA: Insufficient documentation

## 2021-07-26 DIAGNOSIS — M069 Rheumatoid arthritis, unspecified: Secondary | ICD-10-CM | POA: Insufficient documentation

## 2021-07-26 DIAGNOSIS — D473 Essential (hemorrhagic) thrombocythemia: Secondary | ICD-10-CM | POA: Insufficient documentation

## 2021-07-26 DIAGNOSIS — D638 Anemia in other chronic diseases classified elsewhere: Secondary | ICD-10-CM | POA: Insufficient documentation

## 2021-07-26 DIAGNOSIS — Z79899 Other long term (current) drug therapy: Secondary | ICD-10-CM | POA: Insufficient documentation

## 2021-07-26 NOTE — Progress Notes (Signed)
This is patient's second no-show appointment.  Have informed nurse to call her back.  No hydroxyurea refills till she has a clinic visit.  ?If there is a third no-show she might be discharged from our clinic and will need to follow-up with a different hematology practice. ?We should inform her primary care physician as well. ? ?.[place]  ? ?

## 2021-07-29 DIAGNOSIS — D473 Essential (hemorrhagic) thrombocythemia: Secondary | ICD-10-CM | POA: Diagnosis not present

## 2021-07-29 DIAGNOSIS — I10 Essential (primary) hypertension: Secondary | ICD-10-CM | POA: Diagnosis not present

## 2021-07-29 DIAGNOSIS — Z79899 Other long term (current) drug therapy: Secondary | ICD-10-CM | POA: Diagnosis not present

## 2021-07-29 DIAGNOSIS — M059 Rheumatoid arthritis with rheumatoid factor, unspecified: Secondary | ICD-10-CM | POA: Diagnosis not present

## 2021-07-29 DIAGNOSIS — M810 Age-related osteoporosis without current pathological fracture: Secondary | ICD-10-CM | POA: Diagnosis not present

## 2021-07-29 DIAGNOSIS — Z8739 Personal history of other diseases of the musculoskeletal system and connective tissue: Secondary | ICD-10-CM | POA: Diagnosis not present

## 2021-07-29 DIAGNOSIS — M1990 Unspecified osteoarthritis, unspecified site: Secondary | ICD-10-CM | POA: Diagnosis not present

## 2021-08-10 ENCOUNTER — Encounter: Payer: Self-pay | Admitting: Internal Medicine

## 2021-08-22 ENCOUNTER — Other Ambulatory Visit: Payer: Self-pay

## 2021-08-22 ENCOUNTER — Inpatient Hospital Stay: Payer: Medicare Other | Admitting: Hematology

## 2021-08-22 ENCOUNTER — Inpatient Hospital Stay: Payer: Medicare Other

## 2021-08-22 VITALS — BP 156/68 | HR 73 | Temp 97.5°F | Resp 20 | Wt 146.1 lb

## 2021-08-22 DIAGNOSIS — D473 Essential (hemorrhagic) thrombocythemia: Secondary | ICD-10-CM

## 2021-08-22 DIAGNOSIS — M069 Rheumatoid arthritis, unspecified: Secondary | ICD-10-CM | POA: Diagnosis not present

## 2021-08-22 DIAGNOSIS — Z7982 Long term (current) use of aspirin: Secondary | ICD-10-CM | POA: Diagnosis not present

## 2021-08-22 DIAGNOSIS — Z79899 Other long term (current) drug therapy: Secondary | ICD-10-CM | POA: Diagnosis not present

## 2021-08-22 DIAGNOSIS — D638 Anemia in other chronic diseases classified elsewhere: Secondary | ICD-10-CM | POA: Diagnosis not present

## 2021-08-22 LAB — CMP (CANCER CENTER ONLY)
ALT: 23 U/L (ref 0–44)
AST: 26 U/L (ref 15–41)
Albumin: 4 g/dL (ref 3.5–5.0)
Alkaline Phosphatase: 42 U/L (ref 38–126)
Anion gap: 7 (ref 5–15)
BUN: 22 mg/dL (ref 8–23)
CO2: 28 mmol/L (ref 22–32)
Calcium: 9.2 mg/dL (ref 8.9–10.3)
Chloride: 104 mmol/L (ref 98–111)
Creatinine: 1.05 mg/dL — ABNORMAL HIGH (ref 0.44–1.00)
GFR, Estimated: 53 mL/min — ABNORMAL LOW (ref >60)
Glucose, Bld: 91 mg/dL (ref 70–99)
Potassium: 3.4 mmol/L — ABNORMAL LOW (ref 3.5–5.1)
Sodium: 139 mmol/L (ref 135–145)
Total Bilirubin: 0.4 mg/dL (ref 0.3–1.2)
Total Protein: 6.8 g/dL (ref 6.5–8.1)

## 2021-08-22 LAB — CBC WITH DIFFERENTIAL (CANCER CENTER ONLY)
Abs Immature Granulocytes: 0.06 10*3/uL (ref 0.00–0.07)
Basophils Absolute: 0 10*3/uL (ref 0.0–0.1)
Basophils Relative: 0 %
Eosinophils Absolute: 0 10*3/uL (ref 0.0–0.5)
Eosinophils Relative: 0 %
HCT: 31.9 % — ABNORMAL LOW (ref 36.0–46.0)
Hemoglobin: 10.8 g/dL — ABNORMAL LOW (ref 12.0–15.0)
Immature Granulocytes: 1 %
Lymphocytes Relative: 20 %
Lymphs Abs: 1.9 10*3/uL (ref 0.7–4.0)
MCH: 41.5 pg — ABNORMAL HIGH (ref 26.0–34.0)
MCHC: 33.9 g/dL (ref 30.0–36.0)
MCV: 122.7 fL — ABNORMAL HIGH (ref 80.0–100.0)
Monocytes Absolute: 1 10*3/uL (ref 0.1–1.0)
Monocytes Relative: 10 %
Neutro Abs: 6.5 10*3/uL (ref 1.7–7.7)
Neutrophils Relative %: 69 %
Platelet Count: 977 10*3/uL (ref 150–400)
RBC: 2.6 MIL/uL — ABNORMAL LOW (ref 3.87–5.11)
RDW: 14.6 % (ref 11.5–15.5)
WBC Count: 9.4 10*3/uL (ref 4.0–10.5)
nRBC: 0.3 % — ABNORMAL HIGH (ref 0.0–0.2)

## 2021-08-22 LAB — IRON AND IRON BINDING CAPACITY (CC-WL,HP ONLY)
Iron: 120 ug/dL (ref 28–170)
Saturation Ratios: 37 % — ABNORMAL HIGH (ref 10.4–31.8)
TIBC: 326 ug/dL (ref 250–450)
UIBC: 206 ug/dL (ref 148–442)

## 2021-08-22 LAB — RETICULOCYTES
Immature Retic Fract: 33.5 % — ABNORMAL HIGH (ref 2.3–15.9)
RBC.: 2.57 MIL/uL — ABNORMAL LOW (ref 3.87–5.11)
Retic Count, Absolute: 56.8 10*3/uL (ref 19.0–186.0)
Retic Ct Pct: 2.2 % (ref 0.4–3.1)

## 2021-08-22 LAB — FERRITIN: Ferritin: 652 ng/mL — ABNORMAL HIGH (ref 11–307)

## 2021-08-22 NOTE — Progress Notes (Signed)
? ? ?HEMATOLOGY/ONCOLOGY CLINIC NOTE ? ?Date of Service: 08/22/2021 ? ?Patient Care Team: ?[name], MD as PCP - General ?Clarene Essex, MD as Consulting Physician (Gastroenterology) ?Valinda Party, MD (Rheumatology) ? 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 ? ?HISTORY OF PRESENTING ILLNESS:  ?Previous notes for details on initial presentation ? ?INTERVAL HISTORY: ?[name] is a 81 y.o. female who is here today for follow-up for continued evaluation and management of her essential thrombocytosis and review labs. She reports She is doing well with no new symptoms or concerns. ? ?She reports some sinus issues and a mild headache from rheumatoid arthritis. She notes that her rheumatoid arthritis is improving and she endorses the prednisone. ? ?She continues to take her vitamin B12 and vitamin B complex. ? ?She notes no new prohibitive toxicities with oral hydroxyurea.  ? ?We discussed that if iron levels from labs done today are low then it is suggested to her to do IV iron. ? ?No new symptoms suggestive of VTE MI or CVA. ?No bleeding issues on. ? ?Labs done on 08/22/2021 reviewed with her in detail ?CBC shows improvement in her platelet count which is down from 1013k to 977k hemoglobin is 10.8 with a WBC count of 9.4k ?CMP stable ?Ferritin 652 with an iron saturation of 37%. ?No other acute new focal symptoms ? ?MEDICAL HISTORY:  ?Past Medical History:  ?Diagnosis Date  ? Arthritis   ? Cervical stenosis of spine   ? Dysphasia   ? Essential hypertension, benign   ? GERD (gastroesophageal reflux disease)   ? Glaucoma   ? Heart murmur   ? History of estrogen therapy   ? Hypercholesteremia   ? IBS (irritable bowel syndrome)   ? Mitral regurgitation   ? Pulmonary air embolism ([place])   ? states she had this wiht her back surgery  ? Rheumatoid arthritis ([place])   ? Seborrhea   ? patient denies   ? ? ?SURGICAL HISTORY: ?Past Surgical History:  ?Procedure Laterality Date  ? ANTERIOR AND POSTERIOR REPAIR WITH SACROSPINOUS FIXATION N/A 01/08/2015  ? Procedure: ANTERIOR AND POSTERIOR REPAIR WITH possible SACROSPINOUS FIXATION;  Surgeon: Molli Posey, MD;  Location: [facility];  Service: Gynecology;  Laterality: N/A;  ? BACK SURGERY    ? CATARACT EXTRACTION W/ INTRAOCULAR LENS  IMPLANT, BILATERAL Bilateral   ? COLONOSCOPY    ? EYE SURGERY    ? LAPAROSCOPIC ASSISTED VAGINAL HYSTERECTOMY N/A 01/08/2015  ? Procedure: LAPAROSCOPIC ASSISTED VAGINAL HYSTERECTOMY;  Surgeon: Molli Posey, MD;  Location: [facility];  Service: Gynecology;  Laterality: N/A;  ? LUMBAR FUSION  05/2010  ? REVERSE SHOULDER ARTHROPLASTY Left 02/16/2014  ? Procedure: LEFT REVERSE TOTAL SHOULDER ARTHROPLASTY;  Surgeon: Marin Shutter, MD;  Location: [facility];  Service: Orthopedics;  Laterality: Left;  ? REVERSE SHOULDER ARTHROPLASTY Right 08/07/2016  ? REVERSE SHOULDER ARTHROPLASTY Right 08/07/2016  ? Procedure: REVERSE RIGHT SHOULDER ARTHROPLASTY;  Surgeon: Justice Britain, MD;  Location: [facility];  Service: Orthopedics;  Laterality: Right;  ? TOE SURGERY    ? TUBAL LIGATION    ? ? ?SOCIAL HISTORY: ?Social History  ? ?Socioeconomic History  ? Marital status: Widowed  ?  Spouse name: Not on file  ? Number of children: 9  ? Years of education: 7  ? Highest education level: Not on file  ?Occupational History  ? Not on file  ?Tobacco Use  ? Smoking status: Never  ? Smokeless  tobacco: Never  ?Substance and Sexual Activity  ? Alcohol use: No  ? Drug use: No  ? Sexual activity: Never  ?Other Topics Concern  ? Not on file  ?Social History Narrative  ? Lives with daughter  ? Caffeine use: none  ?   ? Social history: She is retired.  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  Does not smoke or consume alcohol.  She is a widow.  ?    ? Family history: Mother died of an MI with history of hypertension who was  83 years of age at the time of her event.  1 brother with history of adult onset diabetes, 5 sisters-one of them has diabetes.  1 daughter died of an occult GI malignancy.  1 brother in good health.  She has 9 children.  1 daughter with history of pulmonary sarcoidosis years ago that resolved.  ? ?Social Determinants of Health  ? ?Financial Resource Strain: Not on file  ?Food Insecurity: Not on file  ?Transportation Needs: Not on file  ?Physical Activity: Not on file  ?Stress: Not on file  ?Social Connections: Not on file  ?Intimate Partner Violence: Not on file  ? ? ?FAMILY HISTORY: ?Family History  ?Problem Relation Age of Onset  ? Healthy Daughter   ? ? ?ALLERGIES:  is allergic to alendronate, allopurinol, amoxicillin-pot clavulanate, folic acid, methotrexate derivatives, latex, and tramadol. ? ?MEDICATIONS:  ?Current Outpatient Medications  ?Medication Sig Dispense Refill  ? aspirin 81 MG tablet Take 81 mg by mouth daily.    ? atorvastatin (LIPITOR) 20 MG tablet TAKE 1 TABLET BY MOUTH  DAILY 90 tablet 3  ? clotrimazole-betamethasone (LOTRISONE) cream Apply 1 application topically 2 (two) times daily. 45 g 2  ? hydroxyurea (HYDREA) 500 MG capsule Take '1500mg'$  po on Monday and Friday and '1000mg'$  po on Tuesday/Thursday/Saturday and Sunday. May take with food to minimize GI side effects. 120 capsule 3  ? mupirocin ointment (BACTROBAN) 2 % APPLY TOPICALLY TO THE AFFECTED AREA TWICE DAILY AS NEEDED 22 g 11  ? NEOMYCIN-POLYMYXIN-HYDROCORTISONE (CORTISPORIN) 1 % SOLN OTIC solution Place 3 drops into the left ear 4 (four) times daily. 10 mL 0  ? polyethylene glycol powder (GLYCOLAX/MIRALAX) powder MIX 1 CAPFUL IN 8 OUNCES OF WATER AND DRINK ONCE DAILY AS DIRECTED 527 g 0  ? predniSONE (DELTASONE) 10 MG tablet Take 10 mg by mouth 1 day or 1 dose.    ? ?No current facility-administered medications for this visit.  ? ? ?REVIEW OF SYSTEMS:   ?.10 Point review of Systems was done is negative except as noted above. ? ? ?PHYSICAL  EXAMINATION: ?NAD ?GENERAL:alert, in no acute distress and comfortable ?SKIN: no acute rashes, no significant lesions ?EYES: conjunctiva are pink and non-injected, sclera anicteric ?NECK: supple, no JVD ?LYMPH:  no palpable lymphadenopathy in the cervical, axillary or inguinal regions ?LUNGS: clear to auscultation b/l with normal respiratory effort ?HEART: regular rate & rhythm ?ABDOMEN:  normoactive bowel sounds , non tender, not distended. ?Extremity: no pedal edema ?PSYCH: alert & oriented x 3 with fluent speech ?NEURO: no focal motor/sensory deficits ? ?Exam performed in chair. ? ?LABORATORY DATA:  ?I have reviewed the data as listed ?. ? ?  Latest Ref Rng & Units 08/22/2021  ?  9:32 AM 06/27/2021  ?  9:18 AM 05/30/2021  ?  8:02 AM  ?CBC  ?WBC 4.0 - 10.5 K/uL 9.4   8.2   9.5    ?  Hemoglobin 12.0 - 15.0 g/dL 10.8   11.4   10.7    ?Hematocrit 36.0 - 46.0 % 31.9   32.0   31.5    ?Platelets 150 - 400 K/uL 977   1,013   858    ? ? ? ?  Latest Ref Rng & Units 08/22/2021  ?  9:32 AM 06/27/2021  ?  9:18 AM 05/30/2021  ?  8:02 AM  ?CMP  ?Glucose 70 - 99 mg/dL 91   78   109    ?BUN 8 - 23 mg/dL '22   20   21    '$ ?Creatinine 0.44 - 1.00 mg/dL 1.05   0.95   1.04    ?Sodium 135 - 145 mmol/L 139   137   139    ?Potassium 3.5 - 5.1 mmol/L 3.4   4.9   3.8    ?Chloride 98 - 111 mmol/L 104   100   104    ?CO2 22 - 32 mmol/L '28   27   30    '$ ?Calcium 8.9 - 10.3 mg/dL 9.2   9.9   9.0    ?Total Protein 6.5 - 8.1 g/dL 6.8   7.0   6.4    ?Total Bilirubin 0.3 - 1.2 mg/dL 0.4   0.6   0.4    ?Alkaline Phos 38 - 126 U/L 42    45    ?AST 15 - 41 U/L 26   29   36    ?ALT 0 - 44 U/L 23   24   36    ? ? ? ? ? ?RADIOGRAPHIC STUDIES: ?I have personally reviewed the radiological images as listed and agreed with the findings in the report. ?No results found. ? ?ASSESSMENT & PLAN:  ? ?81 yo with RA with  ? 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 ?PLAN: ?-Discussed pt labwork with the patient today. Her platelet counts are elevated but stable. ?Labs done today reviewed with her in detail. ?CBC shows improvement in

## 2021-08-22 NOTE — Progress Notes (Signed)
CRITICAL VALUE STICKER ? ?CRITICAL VALUE: PLT 977 ? ?DATE & TIME NOTIFIED: 08/22/21 0956 ? ?MD NOTIFIED: Irene Limbo ? ? ? ? ?

## 2021-08-29 ENCOUNTER — Encounter: Payer: Self-pay | Admitting: Hematology

## 2021-10-07 ENCOUNTER — Other Ambulatory Visit: Payer: Self-pay | Admitting: Hematology

## 2021-10-10 ENCOUNTER — Encounter: Payer: Self-pay | Admitting: Hematology

## 2021-10-29 DIAGNOSIS — H402211 Chronic angle-closure glaucoma, right eye, mild stage: Secondary | ICD-10-CM | POA: Diagnosis not present

## 2021-10-29 DIAGNOSIS — H402222 Chronic angle-closure glaucoma, left eye, moderate stage: Secondary | ICD-10-CM | POA: Diagnosis not present

## 2021-11-01 ENCOUNTER — Telehealth: Payer: Self-pay | Admitting: Hematology

## 2021-11-01 NOTE — Telephone Encounter (Signed)
.  Called patient to schedule appointment per 6/9 ibasket, patient is aware of date and time.

## 2021-11-07 ENCOUNTER — Inpatient Hospital Stay: Payer: Medicare Other

## 2021-11-07 ENCOUNTER — Inpatient Hospital Stay: Payer: Medicare Other | Admitting: Hematology

## 2021-11-11 ENCOUNTER — Other Ambulatory Visit: Payer: Self-pay | Admitting: Hematology

## 2021-11-19 ENCOUNTER — Other Ambulatory Visit: Payer: Self-pay

## 2021-11-19 DIAGNOSIS — D473 Essential (hemorrhagic) thrombocythemia: Secondary | ICD-10-CM

## 2021-11-20 ENCOUNTER — Other Ambulatory Visit: Payer: Self-pay

## 2021-11-20 ENCOUNTER — Inpatient Hospital Stay: Payer: Medicare Other | Attending: Hematology

## 2021-11-20 ENCOUNTER — Inpatient Hospital Stay: Payer: Medicare Other | Admitting: Hematology

## 2021-11-20 VITALS — BP 150/57 | HR 67 | Temp 98.1°F | Resp 16 | Ht 59.0 in | Wt 145.4 lb

## 2021-11-20 DIAGNOSIS — D638 Anemia in other chronic diseases classified elsewhere: Secondary | ICD-10-CM | POA: Diagnosis not present

## 2021-11-20 DIAGNOSIS — Z79899 Other long term (current) drug therapy: Secondary | ICD-10-CM | POA: Insufficient documentation

## 2021-11-20 DIAGNOSIS — D473 Essential (hemorrhagic) thrombocythemia: Secondary | ICD-10-CM

## 2021-11-20 DIAGNOSIS — M069 Rheumatoid arthritis, unspecified: Secondary | ICD-10-CM | POA: Insufficient documentation

## 2021-11-20 DIAGNOSIS — Z7982 Long term (current) use of aspirin: Secondary | ICD-10-CM | POA: Insufficient documentation

## 2021-11-20 LAB — CBC WITH DIFFERENTIAL (CANCER CENTER ONLY)
Abs Immature Granulocytes: 0.11 10*3/uL — ABNORMAL HIGH (ref 0.00–0.07)
Basophils Absolute: 0 10*3/uL (ref 0.0–0.1)
Basophils Relative: 0 %
Eosinophils Absolute: 0 10*3/uL (ref 0.0–0.5)
Eosinophils Relative: 0 %
HCT: 31.7 % — ABNORMAL LOW (ref 36.0–46.0)
Hemoglobin: 10.8 g/dL — ABNORMAL LOW (ref 12.0–15.0)
Immature Granulocytes: 1 %
Lymphocytes Relative: 21 %
Lymphs Abs: 1.6 10*3/uL (ref 0.7–4.0)
MCH: 41.5 pg — ABNORMAL HIGH (ref 26.0–34.0)
MCHC: 34.1 g/dL (ref 30.0–36.0)
MCV: 121.9 fL — ABNORMAL HIGH (ref 80.0–100.0)
Monocytes Absolute: 0.8 10*3/uL (ref 0.1–1.0)
Monocytes Relative: 11 %
Neutro Abs: 5.1 10*3/uL (ref 1.7–7.7)
Neutrophils Relative %: 67 %
Platelet Count: 944 10*3/uL (ref 150–400)
RBC: 2.6 MIL/uL — ABNORMAL LOW (ref 3.87–5.11)
RDW: 14 % (ref 11.5–15.5)
WBC Count: 7.6 10*3/uL (ref 4.0–10.5)
nRBC: 0.4 % — ABNORMAL HIGH (ref 0.0–0.2)

## 2021-11-20 LAB — IRON AND IRON BINDING CAPACITY (CC-WL,HP ONLY)
Iron: 80 ug/dL (ref 28–170)
Saturation Ratios: 28 % (ref 10.4–31.8)
TIBC: 290 ug/dL (ref 250–450)
UIBC: 210 ug/dL (ref 148–442)

## 2021-11-20 LAB — RETICULOCYTES
Immature Retic Fract: 34.5 % — ABNORMAL HIGH (ref 2.3–15.9)
RBC.: 2.54 MIL/uL — ABNORMAL LOW (ref 3.87–5.11)
Retic Count, Absolute: 44.7 10*3/uL (ref 19.0–186.0)
Retic Ct Pct: 1.8 % (ref 0.4–3.1)

## 2021-11-20 LAB — CMP (CANCER CENTER ONLY)
ALT: 20 U/L (ref 0–44)
AST: 24 U/L (ref 15–41)
Albumin: 3.9 g/dL (ref 3.5–5.0)
Alkaline Phosphatase: 41 U/L (ref 38–126)
Anion gap: 7 (ref 5–15)
BUN: 26 mg/dL — ABNORMAL HIGH (ref 8–23)
CO2: 26 mmol/L (ref 22–32)
Calcium: 9 mg/dL (ref 8.9–10.3)
Chloride: 108 mmol/L (ref 98–111)
Creatinine: 1.02 mg/dL — ABNORMAL HIGH (ref 0.44–1.00)
GFR, Estimated: 55 mL/min — ABNORMAL LOW (ref >60)
Glucose, Bld: 86 mg/dL (ref 70–99)
Potassium: 3.2 mmol/L — ABNORMAL LOW (ref 3.5–5.1)
Sodium: 141 mmol/L (ref 135–145)
Total Bilirubin: 0.3 mg/dL (ref 0.3–1.2)
Total Protein: 6.4 g/dL — ABNORMAL LOW (ref 6.5–8.1)

## 2021-11-20 LAB — FERRITIN: Ferritin: 625 ng/mL — ABNORMAL HIGH (ref 11–307)

## 2021-11-20 MED ORDER — LANSOPRAZOLE 30 MG PO CPDR: 30.0000 mg | DELAYED_RELEASE_CAPSULE | Freq: Every day | ORAL | Status: DC

## 2021-11-20 MED ORDER — HYDROXYUREA 500 MG PO CAPS: ORAL_CAPSULE | ORAL | 2 refills | Status: DC

## 2021-11-20 NOTE — Progress Notes (Signed)
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 female is here for continued evaluation and management of her essential thrombocytosis. She notes no acute new symptoms since her last clinic visit.  She has been tolerating her hydroxyurea without any notable new toxicities.  No nausea no vomiting no diarrhea. No significant new concerns since her last clinic visit. No fevers no chills no night sweats no new infections. Labs done today were discussed in detail with the patient. She continues to be on prednisone 5 mg p.o. daily by her rheumatologist for her rheumatoid arthritis.  Notes some persistent neck pain and notes that she will discuss this with her rheumatologist.  MEDICAL HISTORY:  Past Medical History:  Diagnosis Date   Arthritis    Cervical stenosis of spine    Dysphasia    Essential hypertension, benign    GERD (gastroesophageal reflux disease)    Glaucoma    Heart murmur    History of estrogen therapy    Hypercholesteremia    IBS (irritable bowel syndrome)    Mitral regurgitation    Pulmonary air embolism (HCC)    states she had this wiht her back surgery   Rheumatoid arthritis ([place])    Seborrhea    patient denies    SURGICAL HISTORY: Past Surgical History:  Procedure Laterality Date   ANTERIOR AND POSTERIOR REPAIR WITH SACROSPINOUS FIXATION N/A 01/08/2015   Procedure: ANTERIOR AND POSTERIOR REPAIR WITH possible SACROSPINOUS FIXATION;  Surgeon: Molli Posey, MD;  Location: [facility];  Service: Gynecology;  Laterality:  N/A;   BACK SURGERY     CATARACT EXTRACTION W/ INTRAOCULAR LENS  IMPLANT, BILATERAL Bilateral    COLONOSCOPY     EYE SURGERY     LAPAROSCOPIC ASSISTED VAGINAL HYSTERECTOMY N/A 01/08/2015   Procedure: LAPAROSCOPIC ASSISTED VAGINAL HYSTERECTOMY;  Surgeon: Molli Posey, MD;  Location: [facility];  Service: Gynecology;  Laterality: N/A;   LUMBAR FUSION  05/2010   REVERSE SHOULDER ARTHROPLASTY Left 02/16/2014   Procedure: LEFT REVERSE TOTAL SHOULDER ARTHROPLASTY;  Surgeon: Marin Shutter, MD;  Location: [facility];  Service: Orthopedics;  Laterality: Left;   REVERSE SHOULDER ARTHROPLASTY Right 08/07/2016   REVERSE SHOULDER ARTHROPLASTY Right 08/07/2016   Procedure: REVERSE RIGHT SHOULDER ARTHROPLASTY;  Surgeon: Justice Britain, MD;  Location: [facility];  Service: Orthopedics;  Laterality: Right;   TOE SURGERY     TUBAL LIGATION      SOCIAL HISTORY: Social History   Socioeconomic History   Marital status: Widowed    Spouse name: Not on file   Number of children: 9   Years of education: 7   Highest education level: Not on file  Occupational History   Not on file  Tobacco Use   Smoking status: Never   Smokeless tobacco: Never  Substance and Sexual Activity   Alcohol use: No   Drug use: No   Sexual activity: Never  Other Topics Concern   Not on file  Social History Narrative   Lives with daughter   Caffeine use: none      Social history: She is retired.  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  Does not smoke or consume alcohol.  She is a widow.       Family history: Mother died of an MI with history of hypertension who was 83 years of age at the time of her event.  1 brother with history of adult onset diabetes, 5 sisters-one of them has diabetes.  1 daughter died of an occult GI malignancy.  1 brother in good health.  She has 9 children.  1 daughter with history of pulmonary sarcoidosis years ago that resolved.   Social  Determinants of Health   Financial Resource Strain: Not on file  Food Insecurity: Not on file  Transportation Needs: Not on file  Physical Activity: Not on file  Stress: Not on file  Social Connections: Not on file  Intimate Partner Violence: Not on file    FAMILY HISTORY: Family History  Problem Relation Age of Onset   Healthy Daughter     ALLERGIES:  is allergic to alendronate, allopurinol, amoxicillin-pot clavulanate, folic acid, methotrexate derivatives, latex, and tramadol.  MEDICATIONS:  Current Outpatient Medications  Medication Sig Dispense Refill   lansoprazole (PREVACID) 30 MG capsule Take 1 capsule (30 mg total) by mouth daily at 12 noon. 30 capsule    aspirin 81 MG tablet Take 81 mg by mouth daily.     atorvastatin (LIPITOR) 20 MG tablet TAKE 1 TABLET BY MOUTH  DAILY 90 tablet 3   clotrimazole-betamethasone (LOTRISONE) cream Apply 1 application topically 2 (two) times daily. 45 g 2   hydroxyurea (HYDREA) 500 MG capsule TAKE 3 CAPSULES BY MOUTH MONDAYS, WEDNESDAY AND FRIDAYS AND 2 CAPSULES  TUES,THU, SATURDAY, SUNDAYS. TAKE WITH FOOD 90 capsule 2   mupirocin ointment (BACTROBAN) 2 % APPLY TOPICALLY TO THE AFFECTED AREA TWICE DAILY AS NEEDED 22 g 11   NEOMYCIN-POLYMYXIN-HYDROCORTISONE (CORTISPORIN) 1 % SOLN OTIC solution Place 3 drops into the left ear 4 (four) times daily. 10 mL 0   polyethylene glycol powder (GLYCOLAX/MIRALAX) powder MIX 1 CAPFUL IN 8 OUNCES OF WATER AND DRINK ONCE DAILY AS DIRECTED 527 g 0   predniSONE (DELTASONE) 10 MG tablet Take 10 mg by mouth 1 day or 1 dose.     No current facility-administered medications for this visit.    REVIEW OF SYSTEMS:   10 Point review of Systems was done is negative except as noted above.   PHYSICAL EXAMINATION: .BP (!) 150/57 (BP Location: Left Arm, Patient Position: Sitting) Comment: nurse notified  Pulse 67   Temp 98.1 F (36.7 C) (Temporal)   Resp 16   Ht '4\' 11"'$  (1.499 m)   Wt 145 lb 6.4 oz (66 kg)   SpO2  100%   BMI 29.37 kg/m  NAD GENERAL:alert, in no acute distress and comfortable SKIN: no acute rashes, no significant lesions EYES: conjunctiva are pink and non-injected, sclera anicteric OROPHARYNX: MMM, no exudates, no oropharyngeal erythema or ulceration NECK: supple, no JVD LYMPH:  no palpable lymphadenopathy in the cervical, axillary or inguinal regions LUNGS: clear to auscultation b/l with normal respiratory effort HEART: regular rate & rhythm ABDOMEN:  normoactive bowel sounds , non tender, not distended. Extremity: no pedal edema PSYCH: alert & oriented x 3 with fluent speech NEURO: no focal motor/sensory deficits  LABORATORY DATA:  I have reviewed the data as listed .    Latest Ref Rng & Units 11/20/2021    9:44 AM 08/22/2021    9:32 AM 06/27/2021    9:18 AM  CBC  WBC 4.0 - 10.5 K/uL 7.6  9.4  8.2   Hemoglobin 12.0 - 15.0 g/dL 10.8  10.8  11.4   Hematocrit 36.0 - 46.0 % 31.7  31.9  32.0   Platelets 150 - 400 K/uL 944  977  1,013        Latest Ref Rng & Units 11/20/2021    9:44 AM 08/22/2021    9:32 AM 06/27/2021    9:18 AM  CMP  Glucose 70 - 99 mg/dL 86  91  78   BUN 8 - 23 mg/dL '26  22  20   '$ Creatinine 0.44 - 1.00 mg/dL 1.02  1.05  0.95   Sodium 135 - 145 mmol/L 141  139  137   Potassium 3.5 - 5.1 mmol/L 3.2  3.4  4.9   Chloride 98 - 111 mmol/L 108  104  100   CO2 22 - 32 mmol/L '26  28  27   '$ Calcium 8.9 - 10.3 mg/dL 9.0  9.2  9.9   Total Protein 6.5 - 8.1 g/dL 6.4  6.8  7.0   Total Bilirubin 0.3 - 1.2 mg/dL 0.3  0.4  0.6   Alkaline Phos 38 - 126 U/L 41  42    AST 15 - 41 U/L '24  26  29   '$ ALT 0 - 44 U/L '20  23  24    '$ . Lab Results  Component Value Date   IRON 80 11/20/2021   TIBC 290 11/20/2021   IRONPCTSAT 28 11/20/2021   (Iron and TIBC)  Lab Results  Component Value Date   FERRITIN 625 (H) 11/20/2021       RADIOGRAPHIC STUDIES: I have personally reviewed the radiological images as listed and agreed with the findings in the report. No results  found.  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 CBC hemoglobin 10.8 normal WBC count of 7.6k and platelets of 944k  CMP stable -Patient notes no notable toxicities from her current dose of hydroxyurea -We shall increase the patient's dose of hydroxyurea to 1500 mg on Monday Wednesday Friday and 1000 mg the rest of the days of the week  -No indication for additional IV iron at this time -Continue to optimize rheumatoid arthritis treatment with rheumatologist to reduce any reactive component to her thrombocytosis. -Continue 2000 IU Vitamin D daily. -Continue Aspirin, B-complex.  FOLLOW UP: Return to clinic with Dr. Irene Limbo with labs in 3 months  The total time spent in the appointment was 20 minutes*.  All of the patient's questions were answered with apparent satisfaction. The patient knows to call the clinic with any problems, questions or concerns.   Sullivan Lone MD MS AAHIVMS [facility] Hematology/Oncology Physician [facility]  .*Total Encounter Time as defined by the Centers for Medicare and Medicaid Services includes, in addition to the face-to-face time of a patient visit (documented in the note above) non-face-to-face time: obtaining and reviewing outside history, ordering and reviewing medications, tests or procedures, care coordination (communications with other health care professionals or caregivers) and documentation in the medical record.

## 2021-11-26 ENCOUNTER — Encounter: Payer: Self-pay | Admitting: Hematology

## 2021-12-04 DIAGNOSIS — Z8739 Personal history of other diseases of the musculoskeletal system and connective tissue: Secondary | ICD-10-CM | POA: Diagnosis not present

## 2021-12-04 DIAGNOSIS — M1990 Unspecified osteoarthritis, unspecified site: Secondary | ICD-10-CM | POA: Diagnosis not present

## 2021-12-04 DIAGNOSIS — M25511 Pain in right shoulder: Secondary | ICD-10-CM | POA: Diagnosis not present

## 2021-12-04 DIAGNOSIS — Z79899 Other long term (current) drug therapy: Secondary | ICD-10-CM | POA: Diagnosis not present

## 2021-12-04 DIAGNOSIS — M810 Age-related osteoporosis without current pathological fracture: Secondary | ICD-10-CM | POA: Diagnosis not present

## 2021-12-04 DIAGNOSIS — M542 Cervicalgia: Secondary | ICD-10-CM | POA: Diagnosis not present

## 2021-12-04 DIAGNOSIS — M25512 Pain in left shoulder: Secondary | ICD-10-CM | POA: Diagnosis not present

## 2021-12-04 DIAGNOSIS — M059 Rheumatoid arthritis with rheumatoid factor, unspecified: Secondary | ICD-10-CM | POA: Diagnosis not present

## 2021-12-04 DIAGNOSIS — I10 Essential (primary) hypertension: Secondary | ICD-10-CM | POA: Diagnosis not present

## 2021-12-04 DIAGNOSIS — D473 Essential (hemorrhagic) thrombocythemia: Secondary | ICD-10-CM | POA: Diagnosis not present

## 2021-12-19 ENCOUNTER — Other Ambulatory Visit: Payer: Self-pay | Admitting: Internal Medicine

## 2021-12-20 NOTE — Telephone Encounter (Signed)
Schedule AMW

## 2022-01-09 DIAGNOSIS — Z1231 Encounter for screening mammogram for malignant neoplasm of breast: Secondary | ICD-10-CM | POA: Diagnosis not present

## 2022-01-15 ENCOUNTER — Encounter: Payer: Self-pay | Admitting: Internal Medicine

## 2022-01-31 ENCOUNTER — Encounter: Payer: Self-pay | Admitting: Internal Medicine

## 2022-01-31 DIAGNOSIS — M85852 Other specified disorders of bone density and structure, left thigh: Secondary | ICD-10-CM | POA: Diagnosis not present

## 2022-01-31 LAB — HM DEXA SCAN

## 2022-02-06 DIAGNOSIS — I10 Essential (primary) hypertension: Secondary | ICD-10-CM | POA: Diagnosis not present

## 2022-02-06 DIAGNOSIS — M79601 Pain in right arm: Secondary | ICD-10-CM | POA: Diagnosis not present

## 2022-02-06 DIAGNOSIS — M25512 Pain in left shoulder: Secondary | ICD-10-CM | POA: Diagnosis not present

## 2022-02-06 DIAGNOSIS — M25511 Pain in right shoulder: Secondary | ICD-10-CM | POA: Diagnosis not present

## 2022-02-06 DIAGNOSIS — M059 Rheumatoid arthritis with rheumatoid factor, unspecified: Secondary | ICD-10-CM | POA: Diagnosis not present

## 2022-02-06 DIAGNOSIS — M810 Age-related osteoporosis without current pathological fracture: Secondary | ICD-10-CM | POA: Diagnosis not present

## 2022-02-06 DIAGNOSIS — Z8739 Personal history of other diseases of the musculoskeletal system and connective tissue: Secondary | ICD-10-CM | POA: Diagnosis not present

## 2022-02-06 DIAGNOSIS — M542 Cervicalgia: Secondary | ICD-10-CM | POA: Diagnosis not present

## 2022-02-06 DIAGNOSIS — D473 Essential (hemorrhagic) thrombocythemia: Secondary | ICD-10-CM | POA: Diagnosis not present

## 2022-02-06 DIAGNOSIS — M1990 Unspecified osteoarthritis, unspecified site: Secondary | ICD-10-CM | POA: Diagnosis not present

## 2022-02-14 ENCOUNTER — Other Ambulatory Visit: Payer: Medicare Other

## 2022-02-14 DIAGNOSIS — I10 Essential (primary) hypertension: Secondary | ICD-10-CM

## 2022-02-14 DIAGNOSIS — E7800 Pure hypercholesterolemia, unspecified: Secondary | ICD-10-CM | POA: Diagnosis not present

## 2022-02-14 DIAGNOSIS — R7302 Impaired glucose tolerance (oral): Secondary | ICD-10-CM | POA: Diagnosis not present

## 2022-02-14 DIAGNOSIS — R5383 Other fatigue: Secondary | ICD-10-CM | POA: Diagnosis not present

## 2022-02-14 NOTE — Progress Notes (Signed)
Lab only 

## 2022-02-15 LAB — COMPLETE METABOLIC PANEL WITH GFR
AG Ratio: 1.6 (calc) (ref 1.0–2.5)
ALT: 19 U/L (ref 6–29)
AST: 28 U/L (ref 10–35)
Albumin: 4.4 g/dL (ref 3.6–5.1)
Alkaline phosphatase (APISO): 61 U/L (ref 37–153)
BUN/Creatinine Ratio: 22 (calc) (ref 6–22)
BUN: 22 mg/dL (ref 7–25)
CO2: 27 mmol/L (ref 20–32)
Calcium: 10.4 mg/dL (ref 8.6–10.4)
Chloride: 101 mmol/L (ref 98–110)
Creat: 1.01 mg/dL — ABNORMAL HIGH (ref 0.60–0.95)
Globulin: 2.7 g/dL (calc) (ref 1.9–3.7)
Glucose, Bld: 81 mg/dL (ref 65–99)
Potassium: 5.2 mmol/L (ref 3.5–5.3)
Sodium: 138 mmol/L (ref 135–146)
Total Bilirubin: 0.5 mg/dL (ref 0.2–1.2)
Total Protein: 7.1 g/dL (ref 6.1–8.1)
eGFR: 55 mL/min/{1.73_m2} — ABNORMAL LOW (ref >=60)

## 2022-02-15 LAB — CBC WITH DIFFERENTIAL/PLATELET
Absolute Monocytes: 890 cells/uL (ref 200–950)
Basophils Absolute: 17 cells/uL (ref 0–200)
Basophils Relative: 0.2 %
Eosinophils Absolute: 34 cells/uL (ref 15–500)
Eosinophils Relative: 0.4 %
HCT: 31.6 % — ABNORMAL LOW (ref 35.0–45.0)
Hemoglobin: 11.2 g/dL — ABNORMAL LOW (ref 11.7–15.5)
Lymphs Abs: 2428 cells/uL (ref 850–3900)
MCH: 41.6 pg — ABNORMAL HIGH (ref 27.0–33.0)
MCHC: 35.4 g/dL (ref 32.0–36.0)
MCV: 117.5 fL — ABNORMAL HIGH (ref 80.0–100.0)
MPV: 9 fL (ref 7.5–12.5)
Monocytes Relative: 10.6 %
Neutro Abs: 5032 cells/uL (ref 1500–7800)
Neutrophils Relative %: 59.9 %
Platelets: 1180 10*3/uL — ABNORMAL HIGH (ref 140–400)
RBC: 2.69 10*6/uL — ABNORMAL LOW (ref 3.80–5.10)
RDW: 14.5 % (ref 11.0–15.0)
Total Lymphocyte: 28.9 %
WBC: 8.4 10*3/uL (ref 3.8–10.8)

## 2022-02-15 LAB — LIPID PANEL
Cholesterol: 181 mg/dL (ref <200)
HDL: 65 mg/dL (ref >=50)
LDL Cholesterol (Calc): 97 mg/dL (calc)
Non-HDL Cholesterol (Calc): 116 mg/dL (calc) (ref <130)
Total CHOL/HDL Ratio: 2.8 (calc) (ref <5.0)
Triglycerides: 98 mg/dL (ref <150)

## 2022-02-15 LAB — HEMOGLOBIN A1C
Hgb A1c MFr Bld: 5.8 % of total Hgb — ABNORMAL HIGH (ref <5.7)
Mean Plasma Glucose: 120 mg/dL
eAG (mmol/L): 6.6 mmol/L

## 2022-02-15 LAB — TSH: TSH: 2.76 mIU/L (ref 0.40–4.50)

## 2022-02-17 ENCOUNTER — Ambulatory Visit (INDEPENDENT_AMBULATORY_CARE_PROVIDER_SITE_OTHER): Payer: Medicare Other | Admitting: Internal Medicine

## 2022-02-17 ENCOUNTER — Encounter: Payer: Self-pay | Admitting: Internal Medicine

## 2022-02-17 VITALS — BP 140/80 | HR 70 | Temp 97.7°F | Ht 59.75 in | Wt 145.8 lb

## 2022-02-17 DIAGNOSIS — D539 Nutritional anemia, unspecified: Secondary | ICD-10-CM

## 2022-02-17 DIAGNOSIS — D473 Essential (hemorrhagic) thrombocythemia: Secondary | ICD-10-CM

## 2022-02-17 DIAGNOSIS — I10 Essential (primary) hypertension: Secondary | ICD-10-CM | POA: Diagnosis not present

## 2022-02-17 DIAGNOSIS — Z8669 Personal history of other diseases of the nervous system and sense organs: Secondary | ICD-10-CM

## 2022-02-17 DIAGNOSIS — M19011 Primary osteoarthritis, right shoulder: Secondary | ICD-10-CM | POA: Diagnosis not present

## 2022-02-17 DIAGNOSIS — R7302 Impaired glucose tolerance (oral): Secondary | ICD-10-CM

## 2022-02-17 DIAGNOSIS — Z862 Personal history of diseases of the blood and blood-forming organs and certain disorders involving the immune mechanism: Secondary | ICD-10-CM

## 2022-02-17 DIAGNOSIS — M059 Rheumatoid arthritis with rheumatoid factor, unspecified: Secondary | ICD-10-CM | POA: Diagnosis not present

## 2022-02-17 DIAGNOSIS — K2100 Gastro-esophageal reflux disease with esophagitis, without bleeding: Secondary | ICD-10-CM

## 2022-02-17 DIAGNOSIS — Z0000 Encounter for general adult medical examination without abnormal findings: Secondary | ICD-10-CM | POA: Diagnosis not present

## 2022-02-17 DIAGNOSIS — Z23 Encounter for immunization: Secondary | ICD-10-CM | POA: Diagnosis not present

## 2022-02-17 DIAGNOSIS — N1831 Chronic kidney disease, stage 3a: Secondary | ICD-10-CM

## 2022-02-17 DIAGNOSIS — E7800 Pure hypercholesterolemia, unspecified: Secondary | ICD-10-CM

## 2022-02-17 DIAGNOSIS — Z8659 Personal history of other mental and behavioral disorders: Secondary | ICD-10-CM

## 2022-02-17 DIAGNOSIS — Z96612 Presence of left artificial shoulder joint: Secondary | ICD-10-CM

## 2022-02-17 DIAGNOSIS — Z8719 Personal history of other diseases of the digestive system: Secondary | ICD-10-CM

## 2022-02-17 LAB — POCT URINALYSIS DIPSTICK
Bilirubin, UA: NEGATIVE
Blood, UA: NEGATIVE
Glucose, UA: NEGATIVE
Ketones, UA: NEGATIVE
Leukocytes, UA: NEGATIVE
Nitrite, UA: NEGATIVE
Protein, UA: NEGATIVE
Spec Grav, UA: 1.01 (ref 1.010–1.025)
Urobilinogen, UA: 0.2 E.U./dL
pH, UA: 6 (ref 5.0–8.0)

## 2022-02-17 MED ORDER — METHYLPREDNISOLONE ACETATE 80 MG/ML IJ SUSP
80.0000 mg | Freq: Once | INTRAMUSCULAR | Status: AC
  Administered 2022-02-17: 80 mg via INTRAMUSCULAR

## 2022-02-17 MED ORDER — HYDROCODONE-ACETAMINOPHEN 10-325 MG PO TABS: 1.0000 | ORAL_TABLET | Freq: Three times a day (TID) | ORAL | 0 refills | Status: AC | PRN

## 2022-02-17 NOTE — Patient Instructions (Addendum)
Depomedrol IM given for Rheumaotid arthritis pain 80 mg IM and Norco 10/325 to take one half to one  tab every 6-8 sparingly for Rheumatoid arthritis pain (#15 ) tabs. Flu vaccine given. Labs are stable. Seeing Dr. Onnie Graham for neck and shoulder pain. Continue follow up with Rheumatology and Hematology. Covid booster discussed to be obtained at pharmacy.

## 2022-02-17 NOTE — Progress Notes (Signed)
Annual Wellness Visit     Patient: [name], Female    DOB: 08/19/1940, 81 y.o.   MRN: [id number] Visit Date: 02/17/2022   Subjective    [name] is a 81 y.o. female who presents today for her Annual Wellness Visit.  HPI She is also  here for health maintenance exam and evaluation of medical issues. Having trouble with left neck and right shoulder- will be seeing Dr. Onnie Graham soon for this. Rheumatologist, Dr. Dossie Der, sending her for PT. she has a history of seropositive rheumatoid arthritis.  Treated with low-dose prednisone.  Significant shoulder pain possibly thought to be rotator cuff tear and that is why she will be seeing Dr. Onnie Graham.  She has neck pain.  Has osteoarthritis in addition to rheumatoid arthritis.  Remote history of gout but no recent flare in the number of years.  Rheumatoid arthritis was diagnosed in 2013.  She was tried on methotrexate but that was stopped due to sores in her mouth despite taking folic acid.  She tried leflunomide but could not tolerate it.  DMARDs were expensive.  Had bilateral knee aspiration and injection in July 2019 with good relief.  Fluid analysis showed white blood cells more than 27,000 consistent with inflammatory fluid and no crystals.  History of essential thrombocytosis.  Followed by Dr. Irene Limbo for this as well as mild anemia and macrocytosis.  Takes Hydrea for this.  History of hyperlipidemia, mild hypertension, GE reflux, anxiety.  Currently not taking antihypertensive medication by our records.  Request small prescription for hydrocodone APAP for severe pain.  Given number 15 tablets Norco 10/325 to take sparingly.  Remains on Lipitor 20 mg daily for longstanding history of hyperlipidemia.  Given Prevacid 30 mg daily per Dr. Irene Limbo.  I will standing history of GE reflux, remote history of esophageal stricture and gastritis.  History of osteoporosis without fracture.  Multiple drug intolerances including Orencia which causes a  rash, folic acid causes a rash, Fosamax causes GE reflux, methotrexate caused swelling and sores.  Allopurinol caused stomach pain and GI upset.  Also reports intolerance to tramadol and Augmentin.  Has annual diabetic eye exam.  Seen by [facility] ophthalmology.  Underwent right S1-S2 laminectomy with decompression lumbar laminectomy L4-L5 and L5-S1 as well as S1-S2 in January 2012 by Dr. Sherley Bounds.  Postoperatively she had a pulmonary embolus.  After that she had a viral syndrome and had to be hospitalized in February 2012.  History of H. pylori gastritis in 2000.  Compression fracture T7 in 2000.  History of abnormal Pap smear in 1995 with colposcopy.  Bilateral tubal ligation 1979.  D&C for menorrhagia in 1989.  Marsupialization of Bartholin cyst 1987.  Has been evaluated by Hematologist for anemia around 2017 (Dr. Jana Hakim) and was felt to have anemia of chronic disease due to chronic kidney disease.  Initially her anemia was noted after laparoscopic vaginal hysterectomy, BSO and A and P repair August 2016 by Dr. Matthew Saras.  MCV was 97.7 with platelet count 603,000 and normal white blood cell count.  In January 2017 she had an iron level was 21 and was placed on iron supplement which improved her iron level to 76 by April 2017.  She was seen by gastroenterologist because of iron deficiency anemia and had colonoscopy by Dr. Watt Climes in 2014 showing chronic diverticulosis.  Endoscopy at the same time showed esophageal stricture and gastritis.  In February 2017 she had small hiatal hernia and chronic gastritis which was biopsied  at [place].  Multiple gastric polyps were biopsied.  Chronic changes consistent with PPI therapy noted.  Pathology was negative for Helicobacter pylori and she had no evidence of malignancy.  Social history: She is a widow.  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  She resides with her daughter.  She does not smoke or consume alcohol.  Hgb AIC  is good at 5.8%. Hgb 11.2 grams, Creatinine 1.01, platelets 1,180,000, MCV 117.5  Kidney and liver functions are normal. Fasting glucose is normal.  Seen by Dr. Irene Limbo for essential thrombocytosis.Takes Hydrea.  Has had this condition for a number of years.   Social History   Social History Narrative   Lives with daughter   Caffeine use: none      Social history: She is retired.  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  Does not smoke or consume alcohol.  She is a widow.       Family history: Mother died of an MI with history of hypertension who was 83 years of age at the time of her event.  1 brother with history of adult onset diabetes, 5 sisters-one of them has diabetes.  1 daughter died of an occult GI malignancy.  1 brother in good health.  She has 9 children.  1 daughter with history of pulmonary sarcoidosis years ago that resolved.    Patient Care Team: [name], MD as PCP - [name], MD as Consulting Physician (Gastroenterology) Valinda Party, MD (Rheumatology)  Review of Systems issue is right shoulder pain and neck pain   Objective    Vitals: BP (!) 140/82   Pulse 70   Temp 97.7 F (36.5 C) (Tympanic)   Ht 4' 11.75" (1.518 m)   Wt 145 lb 12.8 oz (66.1 kg)   SpO2 100%   BMI 28.71 kg/m   Physical Exam  Skin: Warm and dry.  No cervical adenopathy, thyromegaly or carotid bruits.  Chest clear.  Cardiac exam: Regular rate and rhythm.  Breasts are without masses.  At [facility] is soft nondistended without hepatosplenomegaly masses or tenderness.  No lower extremity pitting edema.  Brief neurological exam intact without focal deficits.  Affect thought and judgment appear to be normal.   Most recent functional status assessment:    02/17/2022   11:03 AM  In your present state of health, do you have any difficulty performing the following activities:  Hearing? 0  Vision? 0  Difficulty  concentrating or making decisions? 0  Walking or climbing stairs? 0  Dressing or bathing? 0  Doing errands, shopping? 0  Preparing Food and eating ? N  Using the Toilet? N  In the past six months, have you accidently leaked urine? N  Do you have problems with loss of bowel control? N  Managing your Medications? N  Managing your Finances? N  Housekeeping or managing your Housekeeping? N   Most recent fall risk assessment:    02/17/2022   11:03 AM  Fall Risk   Falls in the past year? 0  Number falls in past yr: 0  Injury with Fall? 0  Risk for fall due to : No Fall Risks  Follow up Falls evaluation completed    Most recent depression screenings:    02/17/2022   11:03 AM 07/01/2021    2:11 PM  PHQ 2/9 Scores  PHQ - 2 Score 0 0  Most recent cognitive screening:    02/17/2022   11:04 AM  6CIT Screen  What Year? 0 points  What month? 0 points  What time? 0 points  Count back from 20 4 points  Months in reverse 4 points  Repeat phrase 10 points  Total Score 18 points       Assessment & Plan  Rheumatoid arthritis seen and treated by Dr. Dossie Der.  Treated just with prednisone  Right shoulder arthropathy-to be seen by Dr. Onnie Graham in the near future  Left neck pain-musculoskeletal  Essential thrombocytosis treated by Dr. Irene Limbo para  Essential hypertension-currently not on antihypertensive medication  History of stage IIIa chronic kidney disease  History of left reverse total shoulder arthroplasty 2015  Vaginal hysterectomy 2016  History of glaucoma  Impaired glucose tolerance-hemoglobin A1c stable at 5.8%  Hyperlipidemia with atorvastatin 20 mg daily  History of stage IIIa chronic kidney disease-stable for a number of years  GE reflux treated with Prevacid  History of irritable bowel syndrome  Remote history of iron deficiency  Plan: Given Depo-Medrol 80 mg IM today for rheumatoid arthritis symptoms, given hydrocodone APAP 10/325 ( 15 tablets) to take  sparingly for musculoskeletal pain related to rheumatoid arthritis.  Flu vaccine given.  Return in 1 year or as needed.            Annual wellness visit done today including the all of the following: Reviewed patient's Family Medical History Reviewed and updated list of patient's medical providers Assessment of cognitive impairment was done Assessed patient's functional ability Established a written schedule for health screening [place] Completed and Reviewed  Discussed health benefits of physical activity, and encouraged her to engage in regular exercise appropriate for her age and condition.         IElby Showers, MD, have reviewed all documentation for this visit. The documentation on 02/17/22 for the exam, diagnosis, procedures, and orders are all accurate and complete.   LaVon Barron Alvine, CMA

## 2022-02-18 ENCOUNTER — Other Ambulatory Visit: Payer: Self-pay

## 2022-02-18 DIAGNOSIS — D473 Essential (hemorrhagic) thrombocythemia: Secondary | ICD-10-CM

## 2022-02-19 ENCOUNTER — Other Ambulatory Visit: Payer: Self-pay

## 2022-02-19 ENCOUNTER — Inpatient Hospital Stay: Payer: Medicare Other

## 2022-02-19 ENCOUNTER — Inpatient Hospital Stay: Payer: Medicare Other | Attending: Hematology | Admitting: Hematology

## 2022-02-19 VITALS — BP 155/68 | HR 77 | Temp 97.9°F | Resp 17 | Ht 59.75 in | Wt 146.0 lb

## 2022-02-19 DIAGNOSIS — D473 Essential (hemorrhagic) thrombocythemia: Secondary | ICD-10-CM | POA: Diagnosis not present

## 2022-02-19 DIAGNOSIS — D649 Anemia, unspecified: Secondary | ICD-10-CM

## 2022-02-19 LAB — IRON AND IRON BINDING CAPACITY (CC-WL,HP ONLY)
Iron: 70 ug/dL (ref 28–170)
Saturation Ratios: 22 % (ref 10.4–31.8)
TIBC: 319 ug/dL (ref 250–450)
UIBC: 249 ug/dL (ref 148–442)

## 2022-02-19 LAB — CBC WITH DIFFERENTIAL (CANCER CENTER ONLY)
Abs Immature Granulocytes: 0.04 10*3/uL (ref 0.00–0.07)
Basophils Absolute: 0 10*3/uL (ref 0.0–0.1)
Basophils Relative: 1 %
Eosinophils Absolute: 0 10*3/uL (ref 0.0–0.5)
Eosinophils Relative: 1 %
HCT: 30.8 % — ABNORMAL LOW (ref 36.0–46.0)
Hemoglobin: 10.7 g/dL — ABNORMAL LOW (ref 12.0–15.0)
Immature Granulocytes: 1 %
Lymphocytes Relative: 24 %
Lymphs Abs: 2.1 10*3/uL (ref 0.7–4.0)
MCH: 43.1 pg — ABNORMAL HIGH (ref 26.0–34.0)
MCHC: 34.7 g/dL (ref 30.0–36.0)
MCV: 124.2 fL — ABNORMAL HIGH (ref 80.0–100.0)
Monocytes Absolute: 0.9 10*3/uL (ref 0.1–1.0)
Monocytes Relative: 10 %
Neutro Abs: 5.6 10*3/uL (ref 1.7–7.7)
Neutrophils Relative %: 63 %
Platelet Count: 1089 10*3/uL (ref 150–400)
RBC: 2.48 MIL/uL — ABNORMAL LOW (ref 3.87–5.11)
RDW: 14.4 % (ref 11.5–15.5)
WBC Count: 8.8 10*3/uL (ref 4.0–10.5)
nRBC: 0.3 % — ABNORMAL HIGH (ref 0.0–0.2)

## 2022-02-19 LAB — CMP (CANCER CENTER ONLY)
ALT: 18 U/L (ref 0–44)
AST: 27 U/L (ref 15–41)
Albumin: 4.1 g/dL (ref 3.5–5.0)
Alkaline Phosphatase: 69 U/L (ref 38–126)
Anion gap: 5 (ref 5–15)
BUN: 20 mg/dL (ref 8–23)
CO2: 28 mmol/L (ref 22–32)
Calcium: 9.2 mg/dL (ref 8.9–10.3)
Chloride: 106 mmol/L (ref 98–111)
Creatinine: 0.91 mg/dL (ref 0.44–1.00)
GFR, Estimated: >60 mL/min (ref >60)
Glucose, Bld: 84 mg/dL (ref 70–99)
Potassium: 3.8 mmol/L (ref 3.5–5.1)
Sodium: 139 mmol/L (ref 135–145)
Total Bilirubin: 0.4 mg/dL (ref 0.3–1.2)
Total Protein: 7.2 g/dL (ref 6.5–8.1)

## 2022-02-19 LAB — RETICULOCYTES
Immature Retic Fract: 28.8 % — ABNORMAL HIGH (ref 2.3–15.9)
RBC.: 2.57 MIL/uL — ABNORMAL LOW (ref 3.87–5.11)
Retic Count, Absolute: 58.3 10*3/uL (ref 19.0–186.0)
Retic Ct Pct: 2.3 % (ref 0.4–3.1)

## 2022-02-19 LAB — FERRITIN: Ferritin: 622 ng/mL — ABNORMAL HIGH (ref 11–307)

## 2022-02-24 NOTE — Progress Notes (Signed)
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 female is here for continued evaluation and management of her essential thrombocytosis. She reports She is doing well with no new symptoms or concerns.  She has been tolerating her hydroxyurea without any notable new toxicities.    No nausea no vomiting no diarrhea. No significant new concerns since her last clinic visit. No fevers no chills no night sweats no new infections.  No fever, chills, night sweats. No nausea, vomiting, or diarrhea. No new infection issues. No other new or acute focal symptoms.  Labs done today were discussed in detail with the patient.  She continues to be on prednisone 5 mg p.o. daily by her rheumatologist for her rheumatoid arthritis.  Notes some persistent neck pain.  MEDICAL HISTORY:  Past Medical History:  Diagnosis Date   Arthritis    Cervical stenosis of spine    Dysphasia    Essential hypertension, benign    GERD (gastroesophageal reflux disease)    Glaucoma    Heart murmur    History of estrogen therapy    Hypercholesteremia    IBS (irritable bowel syndrome)    Mitral regurgitation    Pulmonary air embolism (HCC)    states she had this wiht her back surgery   Rheumatoid arthritis ([place])    Seborrhea    patient denies    SURGICAL HISTORY: Past Surgical History:  Procedure Laterality Date   ANTERIOR AND POSTERIOR REPAIR WITH SACROSPINOUS FIXATION N/A 01/08/2015   Procedure: ANTERIOR AND POSTERIOR REPAIR WITH possible SACROSPINOUS FIXATION;   Surgeon: Molli Posey, MD;  Location: [facility];  Service: Gynecology;  Laterality: N/A;   BACK SURGERY     CATARACT EXTRACTION W/ INTRAOCULAR LENS  IMPLANT, BILATERAL Bilateral    COLONOSCOPY     EYE SURGERY     LAPAROSCOPIC ASSISTED VAGINAL HYSTERECTOMY N/A 01/08/2015   Procedure: LAPAROSCOPIC ASSISTED VAGINAL HYSTERECTOMY;  Surgeon: Molli Posey, MD;  Location: [facility];  Service: Gynecology;  Laterality: N/A;   LUMBAR FUSION  05/2010   REVERSE SHOULDER ARTHROPLASTY Left 02/16/2014   Procedure: LEFT REVERSE TOTAL SHOULDER ARTHROPLASTY;  Surgeon: Marin Shutter, MD;  Location: [facility];  Service: Orthopedics;  Laterality: Left;   REVERSE SHOULDER ARTHROPLASTY Right 08/07/2016   REVERSE SHOULDER ARTHROPLASTY Right 08/07/2016   Procedure: REVERSE RIGHT SHOULDER ARTHROPLASTY;  Surgeon: Justice Britain, MD;  Location: [facility];  Service: Orthopedics;  Laterality: Right;   TOE SURGERY     TUBAL LIGATION      SOCIAL HISTORY: Social History   Socioeconomic History   Marital status: Widowed    Spouse name: Not on file   Number of children: 9   Years of education: 7   Highest education level: Not on file  Occupational History   Not on file  Tobacco Use   Smoking status: Never   Smokeless tobacco: Never  Substance and Sexual Activity   Alcohol use: No   Drug use: No   Sexual activity: Never  Other Topics Concern   Not on file  Social History Narrative   Lives with daughter   Caffeine use: none  Social history: She is retired.  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  Does not smoke or consume alcohol.  She is a widow.       Family history: Mother died of an MI with history of hypertension who was 83 years of age at the time of her event.  1 brother with history of adult onset diabetes, 5 sisters-one of them has diabetes.  1 daughter died of an occult GI malignancy.  1 brother in good health.  She has 9 children.  1 daughter  with history of pulmonary sarcoidosis years ago that resolved.   Social Determinants of Health   Financial Resource Strain: Not on file  Food Insecurity: Not on file  Transportation Needs: Not on file  Physical Activity: Not on file  Stress: Not on file  Social Connections: Not on file  Intimate Partner Violence: Not on file    FAMILY HISTORY: Family History  Problem Relation Age of Onset   Healthy Daughter     ALLERGIES:  is allergic to alendronate, allopurinol, amoxicillin-pot clavulanate, folic acid, methotrexate derivatives, latex, and tramadol.  MEDICATIONS:  Current Outpatient Medications  Medication Sig Dispense Refill   aspirin 81 MG tablet Take 81 mg by mouth daily.     atorvastatin (LIPITOR) 20 MG tablet TAKE 1 TABLET BY MOUTH  DAILY 90 tablet 3   clotrimazole-betamethasone (LOTRISONE) cream Apply 1 application topically 2 (two) times daily. 45 g 2   hydroxyurea (HYDREA) 500 MG capsule TAKE 3 CAPSULES BY MOUTH MONDAYS, WEDNESDAY AND FRIDAYS AND 2 CAPSULES  TUES,THU, SATURDAY, SUNDAYS. TAKE WITH FOOD 90 capsule 2   lansoprazole (PREVACID) 30 MG capsule Take 1 capsule (30 mg total) by mouth daily at 12 noon. 30 capsule    mupirocin ointment (BACTROBAN) 2 % APPLY TOPICALLY TO THE AFFECTED AREA TWICE DAILY AS NEEDED 22 g 11   NEOMYCIN-POLYMYXIN-HYDROCORTISONE (CORTISPORIN) 1 % SOLN OTIC solution Place 3 drops into the left ear 4 (four) times daily. 10 mL 0   polyethylene glycol powder (GLYCOLAX/MIRALAX) powder MIX 1 CAPFUL IN 8 OUNCES OF WATER AND DRINK ONCE DAILY AS DIRECTED 527 g 0   predniSONE (DELTASONE) 10 MG tablet Take 10 mg by mouth 1 day or 1 dose.     No current facility-administered medications for this visit.    REVIEW OF SYSTEMS:   10 Point review of Systems was done is negative except as noted above.   PHYSICAL EXAMINATION: .BP (!) 155/68 (BP Location: Left Arm, Patient Position: Sitting) Comment: nurse notified  Pulse 77   Temp 97.9 F (36.6 C)  (Temporal)   Resp 17   Ht 4' 11.75" (1.518 m)   Wt 146 lb (66.2 kg)   SpO2 97%   BMI 28.75 kg/m  NAD GENERAL:alert, in no acute distress and comfortable SKIN: no acute rashes, no significant lesions EYES: conjunctiva are pink and non-injected, sclera anicteric NECK: supple, no JVD LYMPH:  no palpable lymphadenopathy in the cervical, axillary or inguinal regions LUNGS: clear to auscultation b/l with normal respiratory effort HEART: regular rate & rhythm ABDOMEN:  normoactive bowel sounds , non tender, not distended. Extremity: no pedal edema PSYCH: alert & oriented x 3 with fluent speech NEURO: no focal motor/sensory deficits  LABORATORY DATA:  I have reviewed the data as listed .    Latest Ref Rng & Units 02/19/2022   10:00 AM 02/14/2022    9:40 AM 11/20/2021    9:44 AM  CBC  WBC 4.0 - 10.5 K/uL 8.8  8.4  7.6   Hemoglobin 12.0 - 15.0 g/dL 10.7  11.2  10.8   Hematocrit 36.0 - 46.0 % 30.8  31.6  31.7   Platelets 150 - 400 K/uL 1,089  1,180  944        Latest Ref Rng & Units 02/19/2022   10:00 AM 02/14/2022    9:40 AM 11/20/2021    9:44 AM  CMP  Glucose 70 - 99 mg/dL 84  81  86   BUN 8 - 23 mg/dL '20  22  26   '$ Creatinine 0.44 - 1.00 mg/dL 0.91  1.01  1.02   Sodium 135 - 145 mmol/L 139  138  141   Potassium 3.5 - 5.1 mmol/L 3.8  5.2  3.2   Chloride 98 - 111 mmol/L 106  101  108   CO2 22 - 32 mmol/L '28  27  26   '$ Calcium 8.9 - 10.3 mg/dL 9.2  10.4  9.0   Total Protein 6.5 - 8.1 g/dL 7.2  7.1  6.4   Total Bilirubin 0.3 - 1.2 mg/dL 0.4  0.5  0.3   Alkaline Phos 38 - 126 U/L 69   41   AST 15 - 41 U/L '27  28  24   '$ ALT 0 - 44 U/L '18  19  20    '$ . Lab Results  Component Value Date   IRON 70 02/19/2022   TIBC 319 02/19/2022   IRONPCTSAT 22 02/19/2022   (Iron and TIBC)  Lab Results  Component Value Date   FERRITIN 622 (H) 02/19/2022       RADIOGRAPHIC STUDIES: I have personally reviewed the radiological images as listed and agreed with the findings in the  report. No results found.  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 CBC hemoglobin 10.7 normal WBC count of 8.8k and platelets of 1089k  CMP stable Iron labs show sat ratio of 22% and Ferritin of 622. -Patient notes no notable toxicities from her current dose of hydroxyurea -We shall increase the patient's dose of hydroxyurea to 1500 mg on 4 days a week and 1000 mg the rest of the days of the week  -No indication for additional IV iron at this time -Continue to optimize rheumatoid arthritis treatment with rheumatologist to reduce any reactive component to her thrombocytosis. -Continue 2000 IU Vitamin D daily. -Continue Aspirin, B-complex.  FOLLOW UP: Return to clinic with Dr. Irene Limbo with labs in 10 weeks  The total time spent in the appointment was 20 minutes*.  All of the patient's questions were answered with apparent satisfaction. The patient knows to call the clinic with any problems, questions or concerns.   Sullivan Lone MD MS AAHIVMS [facility] Hematology/Oncology Physician [facility]  .*Total Encounter Time as defined by the Centers for Medicare and Medicaid Services includes, in addition to the face-to-face time of a patient visit (documented in the note above) non-face-to-face time: obtaining and reviewing outside history, ordering and reviewing medications, tests or procedures, care coordination (communications with other health care professionals or caregivers) and documentation in the medical record.  I, Melene Muller, am acting as scribe for Dr. Sullivan Lone, MD.  .I have reviewed the above documentation  for accuracy and completeness, and I agree with the above. Brunetta Genera MD

## 2022-02-25 ENCOUNTER — Encounter: Payer: Self-pay | Admitting: Hematology

## 2022-02-25 MED ORDER — HYDROXYUREA 500 MG PO CAPS: ORAL_CAPSULE | ORAL | 2 refills | Status: DC

## 2022-03-04 ENCOUNTER — Other Ambulatory Visit: Payer: Self-pay | Admitting: Internal Medicine

## 2022-03-06 DIAGNOSIS — M542 Cervicalgia: Secondary | ICD-10-CM | POA: Diagnosis not present

## 2022-03-11 DIAGNOSIS — M810 Age-related osteoporosis without current pathological fracture: Secondary | ICD-10-CM | POA: Diagnosis not present

## 2022-03-11 DIAGNOSIS — M059 Rheumatoid arthritis with rheumatoid factor, unspecified: Secondary | ICD-10-CM | POA: Diagnosis not present

## 2022-03-11 DIAGNOSIS — M25511 Pain in right shoulder: Secondary | ICD-10-CM | POA: Diagnosis not present

## 2022-03-11 DIAGNOSIS — I10 Essential (primary) hypertension: Secondary | ICD-10-CM | POA: Diagnosis not present

## 2022-03-11 DIAGNOSIS — M1990 Unspecified osteoarthritis, unspecified site: Secondary | ICD-10-CM | POA: Diagnosis not present

## 2022-03-11 DIAGNOSIS — D473 Essential (hemorrhagic) thrombocythemia: Secondary | ICD-10-CM | POA: Diagnosis not present

## 2022-03-11 DIAGNOSIS — M542 Cervicalgia: Secondary | ICD-10-CM | POA: Diagnosis not present

## 2022-03-11 DIAGNOSIS — Z8739 Personal history of other diseases of the musculoskeletal system and connective tissue: Secondary | ICD-10-CM | POA: Diagnosis not present

## 2022-03-13 DIAGNOSIS — M4802 Spinal stenosis, cervical region: Secondary | ICD-10-CM | POA: Diagnosis not present

## 2022-03-13 DIAGNOSIS — M542 Cervicalgia: Secondary | ICD-10-CM | POA: Diagnosis not present

## 2022-03-27 DIAGNOSIS — M542 Cervicalgia: Secondary | ICD-10-CM | POA: Diagnosis not present

## 2022-04-24 ENCOUNTER — Encounter: Payer: Self-pay | Admitting: Internal Medicine

## 2022-04-24 DIAGNOSIS — H402211 Chronic angle-closure glaucoma, right eye, mild stage: Secondary | ICD-10-CM | POA: Diagnosis not present

## 2022-04-24 DIAGNOSIS — H524 Presbyopia: Secondary | ICD-10-CM | POA: Diagnosis not present

## 2022-04-24 DIAGNOSIS — H402222 Chronic angle-closure glaucoma, left eye, moderate stage: Secondary | ICD-10-CM | POA: Diagnosis not present

## 2022-04-24 DIAGNOSIS — Z961 Presence of intraocular lens: Secondary | ICD-10-CM | POA: Diagnosis not present

## 2022-04-24 DIAGNOSIS — H26493 Other secondary cataract, bilateral: Secondary | ICD-10-CM | POA: Diagnosis not present

## 2022-04-24 LAB — HM DIABETES EYE EXAM

## 2022-06-05 DIAGNOSIS — H402211 Chronic angle-closure glaucoma, right eye, mild stage: Secondary | ICD-10-CM | POA: Diagnosis not present

## 2022-07-07 ENCOUNTER — Ambulatory Visit: Payer: Self-pay

## 2022-07-07 NOTE — Patient Outreach (Signed)
  Care Coordination   07/07/2022 Name: [name] MRN: [id number] DOB: 04/17/1941   Care Coordination Outreach Attempts:  An unsuccessful telephone outreach was attempted today to offer the patient information about available care coordination services as a benefit of their health plan.   Follow Up Plan:  Additional outreach attempts will be made to offer the patient care coordination information and services.   Encounter Outcome:  No Answer   Care Coordination Interventions:  No, not indicated    Daneen Schick, BSW, CDP Social Worker, Certified Dementia Practitioner [facility]  Care Coordination [phone]

## 2022-07-11 DIAGNOSIS — H402222 Chronic angle-closure glaucoma, left eye, moderate stage: Secondary | ICD-10-CM | POA: Diagnosis not present

## 2022-07-11 DIAGNOSIS — H402211 Chronic angle-closure glaucoma, right eye, mild stage: Secondary | ICD-10-CM | POA: Diagnosis not present

## 2022-07-15 ENCOUNTER — Encounter: Payer: Self-pay | Admitting: Internal Medicine

## 2022-07-15 DIAGNOSIS — M25511 Pain in right shoulder: Secondary | ICD-10-CM | POA: Diagnosis not present

## 2022-07-15 DIAGNOSIS — Z8739 Personal history of other diseases of the musculoskeletal system and connective tissue: Secondary | ICD-10-CM | POA: Diagnosis not present

## 2022-07-15 DIAGNOSIS — M059 Rheumatoid arthritis with rheumatoid factor, unspecified: Secondary | ICD-10-CM | POA: Diagnosis not present

## 2022-07-15 DIAGNOSIS — D473 Essential (hemorrhagic) thrombocythemia: Secondary | ICD-10-CM | POA: Diagnosis not present

## 2022-07-15 DIAGNOSIS — M810 Age-related osteoporosis without current pathological fracture: Secondary | ICD-10-CM | POA: Diagnosis not present

## 2022-07-15 DIAGNOSIS — M542 Cervicalgia: Secondary | ICD-10-CM | POA: Diagnosis not present

## 2022-07-15 DIAGNOSIS — I10 Essential (primary) hypertension: Secondary | ICD-10-CM | POA: Diagnosis not present

## 2022-07-15 DIAGNOSIS — M1990 Unspecified osteoarthritis, unspecified site: Secondary | ICD-10-CM | POA: Diagnosis not present

## 2022-07-15 LAB — CBC AND DIFFERENTIAL
HCT: 31 — AB (ref 36–46)
Hemoglobin: 10.7 — AB (ref 12.0–16.0)
Neutrophils Absolute: 2.6
Platelets: 1090 10*3/uL — AB (ref 150–400)
WBC: 4.8

## 2022-07-15 LAB — BASIC METABOLIC PANEL
BUN: 22 — AB (ref 4–21)
CO2: 25 — AB (ref 13–22)
Chloride: 106 (ref 99–108)
Creatinine: 1 (ref 0.5–1.1)
Glucose: 68
Potassium: 4.9 mEq/L (ref 3.5–5.1)
Sodium: 143 (ref 137–147)

## 2022-07-15 LAB — COMPREHENSIVE METABOLIC PANEL
Albumin: 3.7 (ref 3.5–5.0)
Calcium: 9.2 (ref 8.7–10.7)
Globulin: 3.1

## 2022-07-15 LAB — HEPATIC FUNCTION PANEL
ALT: 36 U/L — AB (ref 7–35)
AST: 28 (ref 13–35)
Alkaline Phosphatase: 82 (ref 25–125)
Bilirubin, Total: 0.4

## 2022-07-15 LAB — CBC: RBC: 2.52 — AB (ref 3.87–5.11)

## 2022-08-06 ENCOUNTER — Telehealth: Payer: Self-pay | Admitting: Internal Medicine

## 2022-08-06 NOTE — Telephone Encounter (Signed)
Spoke with Mateo Flow about rescheduling this appointment, she is going to [place] call me back.

## 2022-08-06 NOTE — Telephone Encounter (Signed)
Patient called back and rescheduled appointment

## 2022-08-14 ENCOUNTER — Other Ambulatory Visit: Payer: Medicare Other

## 2022-08-21 ENCOUNTER — Ambulatory Visit: Payer: Medicare Other | Admitting: Internal Medicine

## 2022-08-21 ENCOUNTER — Other Ambulatory Visit: Payer: Medicare Other

## 2022-08-21 DIAGNOSIS — R7302 Impaired glucose tolerance (oral): Secondary | ICD-10-CM | POA: Diagnosis not present

## 2022-08-21 DIAGNOSIS — E7800 Pure hypercholesterolemia, unspecified: Secondary | ICD-10-CM

## 2022-08-21 NOTE — Progress Notes (Signed)
Patient Care Team: [name], MD as PCP - [name], MD as Consulting Physician (Gastroenterology) Valinda Party, MD (Rheumatology)  Visit Date: 08/28/22  Subjective:    Patient ID: [name] , Female   DOB: 09/26/1940, 82 y.o.    MRN: [id number]   82 y.o. Female presents today for a 6 month check-up. Patient has a past medical history of dysphasia, essential hypertension, GERD, glaucoma, heart murmur, hypercholesteremia, IBS, mitral regurgitation, pulmonary air embolism, rheumatoid arthritis, seborrhea, cervical stenosis of spine.  History of hyperlipidemia treated with Lipitor 20 mg daily. Lipid panel normal on 08/21/22.  History of osteoarthritis. History of seropositive rheumatoid arthritis diagnosed in 2013 and treated with low-dose prednisone. Followed by rheumatologist, Dr. Dossie Der. Has been doing regular PT. History of left neck and right shoulder pain. Seen by Dr. Onnie Graham. Remote history of gout but no recent flare-ups. Has tried methotrexate but that was stopped due to sores in her mouth despite taking folic acid.  She tried leflunomide but could not tolerate it.  DMARDs were expensive.  Had bilateral knee aspiration and injection in July 2019 with good relief.  Fluid analysis showed white blood cells more than 27,000 consistent with inflammatory fluid and no crystals. She has fluctuating pain in her neck. Taking Tylenol with inadequate relief. Arthritis in hands is better. No longer taking prednisone. Reports swelling in ankles that goes down in the evening and flares up in the morning.  Reports itching in left ear. Cortisone otic drops have helped in the past.  History of essential thrombocytosis.  Followed by Dr. Irene Limbo for this as well as mild anemia and macrocytosis. Treated with Hydrea 500 mg 3 capsules Mon, Tues, Thurs, Fri and 2 capsules Wed, Sat, Sun with food.  History of mild hypertension and not taking medications. Blood pressure normal today at  130/70.  History of osteoporosis without fracture.   Hgb AIC is good at 5.8%. Hgb 10.7 grams, Creatinine 20, platelets 1,090,000, MCV 124.2.  BUN at 22. ALT at 36. Fasting glucose is normal.  Social history: She is a widow.  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  She resides with her daughter.  She does not smoke or consume alcohol.  Past Medical History:  Diagnosis Date   Arthritis    Cervical stenosis of spine    Dysphasia    Essential hypertension, benign    GERD (gastroesophageal reflux disease)    Glaucoma    Heart murmur    History of estrogen therapy    Hypercholesteremia    IBS (irritable bowel syndrome)    Mitral regurgitation    Pulmonary air embolism (HCC)    states she had this wiht her back surgery   Rheumatoid arthritis ([place])    Seborrhea    patient denies     Family History  Problem Relation Age of Onset   Healthy Daughter     Social History   Social History Narrative   Lives with daughter   Caffeine use: none      Social history: She is retired.  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  Does not smoke or consume alcohol.  She is a widow.       Family history: Mother died of an MI with history of hypertension who was 84 years of age at the time of her event.  1 brother with history of adult onset  diabetes, 5 sisters-one of them has diabetes.  1 daughter died of an occult GI malignancy.  1 brother in good health.  She has 9 children.  1 daughter with history of pulmonary sarcoidosis years ago that resolved.      Review of Systems  Constitutional:  Negative for chills, fever, malaise/fatigue and weight loss.  HENT:  Negative for hearing loss, sinus pain and sore throat.   Respiratory:  Negative for cough, hemoptysis and shortness of breath.   Cardiovascular:  Positive for leg swelling. Negative for chest pain, palpitations and PND.   Gastrointestinal:  Negative for abdominal pain, constipation, diarrhea, heartburn, nausea and vomiting.  Genitourinary:  Negative for dysuria, frequency and urgency.  Musculoskeletal:  Positive for neck pain. Negative for back pain and myalgias.  Skin:  Positive for itching (Left ear). Negative for rash.  Neurological:  Negative for dizziness, tingling, seizures and headaches.  Endo/Heme/Allergies:  Negative for polydipsia.  Psychiatric/Behavioral:  Negative for depression. The patient is not nervous/anxious.         Objective:   Vitals: BP 130/70   Pulse 68   Temp 98.6 F (37 C) (Tympanic)   Ht 4' 11.75" (1.518 m)   Wt 139 lb (63 kg)   SpO2 99%   BMI 27.37 kg/m    Physical Exam Vitals and nursing note reviewed.  Constitutional:      General: She is not in acute distress.    Appearance: Normal appearance. She is not toxic-appearing.  HENT:     Head: Normocephalic and atraumatic.  Cardiovascular:     Rate and Rhythm: Normal rate and regular rhythm. No extrasystoles are present.    Pulses: Normal pulses.     Heart sounds: Murmur heard.     Systolic murmur is present with a grade of 2/6.     No friction rub. No gallop.  Pulmonary:     Effort: Pulmonary effort is normal. No respiratory distress.     Breath sounds: Normal breath sounds. No wheezing or rales.  Skin:    General: Skin is warm and dry.  Neurological:     Mental Status: She is alert and oriented to person, place, and time. Mental status is at baseline.  Psychiatric:        Mood and Affect: Mood normal.        Behavior: Behavior normal.        Thought Content: Thought content normal.        Judgment: Judgment normal.       Results:   Studies obtained and personally reviewed by me:   Labs:       Component Value Date/Time   NA 143 07/15/2022 0000   K 4.9 07/15/2022 0000   CL 106 07/15/2022 0000   CO2 25 (A) 07/15/2022 0000   GLUCOSE 84 02/19/2022 1000   BUN 22 (A) 07/15/2022 0000   CREATININE  1.0 07/15/2022 0000   CREATININE 0.91 02/19/2022 1000   CREATININE 1.01 (H) 02/14/2022 0940   CALCIUM 9.2 07/15/2022 0000   PROT 6.9 08/21/2022 0932   ALBUMIN 3.7 07/15/2022 0000   AST 21 08/21/2022 0932   AST 27 02/19/2022 1000   ALT 12 08/21/2022 0932   ALT 18 02/19/2022 1000   ALKPHOS 82 07/15/2022 0000   BILITOT 0.5 08/21/2022 0932   BILITOT 0.4 02/19/2022 1000   GFRNONAA >60 02/19/2022 1000   GFRNONAA 59 (L) 06/22/2020 0907   GFRAA 69 06/22/2020 0907     Lab Results  Component Value Date  WBC 4.8 07/15/2022   HGB 10.7 (A) 07/15/2022   HCT 31 (A) 07/15/2022   MCV 124.2 (H) 02/19/2022   PLT 1,090 (A) 07/15/2022    Lab Results  Component Value Date   CHOL 169 08/21/2022   HDL 57 08/21/2022   LDLCALC 96 08/21/2022   TRIG 75 08/21/2022   CHOLHDL 3.0 08/21/2022    Lab Results  Component Value Date   HGBA1C 5.8 (H) 08/21/2022     Lab Results  Component Value Date   TSH 2.76 02/14/2022      Assessment & Plan:   Hyperlipidemia: treated with Lipitor 20 mg daily. Lipid panel normal on 08/21/22.  Osteoarthritis: having some neck pain, arthritis in hands is better. Prescribed Norco 10-325 mg to be used sparingly.  Seborrhea: has itching in left ear. Refilled Cortisporin 1% three drops into the left ear 4 times daily.  Essential thrombocytosis: followed by Dr. Candise Che for this as well as mild anemia and macrocytosis. Treated with Hydrea 500 mg 3 capsules Mon, Tues, Thurs, Fri and 2 capsules Wed, Sat, Sun with food.   Mild hypertension: not taking medications. Blood pressure normal today at 130/70.    I,Alexander Ruley,acting as a Neurosurgeon for Margaree Mackintosh, MD.,have documented all relevant documentation on the behalf of Margaree Mackintosh, MD,as directed by  Margaree Mackintosh, MD while in the presence of Margaree Mackintosh, MD.   I, Margaree Mackintosh, MD, have reviewed all documentation for this visit. The documentation on 09/07/22 for the exam, diagnosis, procedures, and orders are  all accurate and complete.

## 2022-08-22 LAB — HEMOGLOBIN A1C
Hgb A1c MFr Bld: 5.8 % of total Hgb — ABNORMAL HIGH (ref <5.7)
Mean Plasma Glucose: 120 mg/dL
eAG (mmol/L): 6.6 mmol/L

## 2022-08-22 LAB — HEPATIC FUNCTION PANEL
AG Ratio: 1.5 (calc) (ref 1.0–2.5)
ALT: 12 U/L (ref 6–29)
AST: 21 U/L (ref 10–35)
Albumin: 4.1 g/dL (ref 3.6–5.1)
Alkaline phosphatase (APISO): 70 U/L (ref 37–153)
Bilirubin, Direct: 0.1 mg/dL (ref 0.0–0.2)
Globulin: 2.8 g/dL (calc) (ref 1.9–3.7)
Indirect Bilirubin: 0.4 mg/dL (calc) (ref 0.2–1.2)
Total Bilirubin: 0.5 mg/dL (ref 0.2–1.2)
Total Protein: 6.9 g/dL (ref 6.1–8.1)

## 2022-08-22 LAB — LIPID PANEL
Cholesterol: 169 mg/dL (ref <200)
HDL: 57 mg/dL (ref >=50)
LDL Cholesterol (Calc): 96 mg/dL (calc)
Non-HDL Cholesterol (Calc): 112 mg/dL (calc) (ref <130)
Total CHOL/HDL Ratio: 3 (calc) (ref <5.0)
Triglycerides: 75 mg/dL (ref <150)

## 2022-08-22 LAB — MICROALBUMIN / CREATININE URINE RATIO
Creatinine, Urine: 20 mg/dL (ref 20–275)
Microalb Creat Ratio: 10 mg/g creat (ref <30)
Microalb, Ur: 0.2 mg/dL

## 2022-08-28 ENCOUNTER — Ambulatory Visit (INDEPENDENT_AMBULATORY_CARE_PROVIDER_SITE_OTHER): Payer: Medicare Other | Admitting: Internal Medicine

## 2022-08-28 ENCOUNTER — Other Ambulatory Visit: Payer: Self-pay

## 2022-08-28 VITALS — BP 130/70 | HR 68 | Temp 98.6°F | Ht 59.75 in | Wt 139.0 lb

## 2022-08-28 DIAGNOSIS — L299 Pruritus, unspecified: Secondary | ICD-10-CM

## 2022-08-28 DIAGNOSIS — Z0000 Encounter for general adult medical examination without abnormal findings: Secondary | ICD-10-CM | POA: Diagnosis not present

## 2022-08-28 DIAGNOSIS — N1831 Chronic kidney disease, stage 3a: Secondary | ICD-10-CM

## 2022-08-28 DIAGNOSIS — E7800 Pure hypercholesterolemia, unspecified: Secondary | ICD-10-CM | POA: Diagnosis not present

## 2022-08-28 DIAGNOSIS — R7302 Impaired glucose tolerance (oral): Secondary | ICD-10-CM

## 2022-08-28 DIAGNOSIS — Z8669 Personal history of other diseases of the nervous system and sense organs: Secondary | ICD-10-CM | POA: Diagnosis not present

## 2022-08-28 DIAGNOSIS — D473 Essential (hemorrhagic) thrombocythemia: Secondary | ICD-10-CM

## 2022-08-28 DIAGNOSIS — Z8659 Personal history of other mental and behavioral disorders: Secondary | ICD-10-CM

## 2022-08-28 MED ORDER — HYDROCODONE-ACETAMINOPHEN 10-325 MG PO TABS: 1.0000 | ORAL_TABLET | Freq: Three times a day (TID) | ORAL | 0 refills | Status: AC | PRN

## 2022-08-28 MED ORDER — ATORVASTATIN CALCIUM 20 MG PO TABS: 20.0000 mg | ORAL_TABLET | Freq: Every day | ORAL | 2 refills | Status: DC

## 2022-08-28 MED ORDER — NEOMYCIN-POLYMYXIN-HC 1 % OT SOLN: 3.0000 [drp] | Freq: Four times a day (QID) | OTIC | 0 refills | Status: DC

## 2022-09-05 ENCOUNTER — Other Ambulatory Visit: Payer: Self-pay

## 2022-09-05 DIAGNOSIS — D473 Essential (hemorrhagic) thrombocythemia: Secondary | ICD-10-CM

## 2022-09-05 MED ORDER — HYDROXYUREA 500 MG PO CAPS: ORAL_CAPSULE | ORAL | 2 refills | Status: DC

## 2022-09-07 ENCOUNTER — Encounter: Payer: Self-pay | Admitting: Internal Medicine

## 2022-09-07 NOTE — Patient Instructions (Signed)
Labs are stable. Lipid and liver panels are normal. Hgb AIC stable at 5.8 %. Continue same meds and follow up here in 6 months for Medicare wellness visit and annual PE. Take Norco sparingly for pain. Prescribed Cortisporin drops for ear itching.

## 2022-09-08 ENCOUNTER — Telehealth: Payer: Self-pay | Admitting: Hematology

## 2022-09-08 NOTE — Telephone Encounter (Signed)
Returned patient's call regarding scheduling a f/u and checking for her medication being refilled. Patient scheduled and notified. RN made aware and prescriptions are being filled.

## 2022-09-30 ENCOUNTER — Other Ambulatory Visit: Payer: Self-pay

## 2022-09-30 DIAGNOSIS — D473 Essential (hemorrhagic) thrombocythemia: Secondary | ICD-10-CM

## 2022-10-01 ENCOUNTER — Other Ambulatory Visit: Payer: Self-pay

## 2022-10-01 ENCOUNTER — Ambulatory Visit (HOSPITAL_BASED_OUTPATIENT_CLINIC_OR_DEPARTMENT_OTHER)
Admission: RE | Admit: 2022-10-01 | Discharge: 2022-10-01 | Disposition: A | Discharge status: Home / Self Care | Payer: Medicare Other | Source: Ambulatory Visit | Attending: Hematology | Admitting: Hematology

## 2022-10-01 ENCOUNTER — Inpatient Hospital Stay: Payer: Medicare Other | Attending: Hematology

## 2022-10-01 ENCOUNTER — Inpatient Hospital Stay: Payer: Medicare Other | Admitting: Hematology

## 2022-10-01 VITALS — BP 194/77 | HR 64 | Temp 97.9°F | Resp 16 | Wt 139.1 lb

## 2022-10-01 DIAGNOSIS — R600 Localized edema: Secondary | ICD-10-CM | POA: Insufficient documentation

## 2022-10-01 DIAGNOSIS — D649 Anemia, unspecified: Secondary | ICD-10-CM

## 2022-10-01 DIAGNOSIS — M7989 Other specified soft tissue disorders: Secondary | ICD-10-CM | POA: Insufficient documentation

## 2022-10-01 DIAGNOSIS — M79605 Pain in left leg: Secondary | ICD-10-CM | POA: Diagnosis not present

## 2022-10-01 DIAGNOSIS — M25471 Effusion, right ankle: Secondary | ICD-10-CM | POA: Diagnosis not present

## 2022-10-01 DIAGNOSIS — M069 Rheumatoid arthritis, unspecified: Secondary | ICD-10-CM | POA: Diagnosis not present

## 2022-10-01 DIAGNOSIS — M79604 Pain in right leg: Secondary | ICD-10-CM | POA: Insufficient documentation

## 2022-10-01 DIAGNOSIS — D473 Essential (hemorrhagic) thrombocythemia: Secondary | ICD-10-CM | POA: Insufficient documentation

## 2022-10-01 DIAGNOSIS — M542 Cervicalgia: Secondary | ICD-10-CM | POA: Insufficient documentation

## 2022-10-01 LAB — IRON AND IRON BINDING CAPACITY (CC-WL,HP ONLY)
Iron: 82 ug/dL (ref 28–170)
Saturation Ratios: 25 % (ref 10.4–31.8)
TIBC: 325 ug/dL (ref 250–450)
UIBC: 243 ug/dL (ref 148–442)

## 2022-10-01 LAB — CMP (CANCER CENTER ONLY)
ALT: 13 U/L (ref 0–44)
AST: 22 U/L (ref 15–41)
Albumin: 4.1 g/dL (ref 3.5–5.0)
Alkaline Phosphatase: 71 U/L (ref 38–126)
Anion gap: 6 (ref 5–15)
BUN: 19 mg/dL (ref 8–23)
CO2: 27 mmol/L (ref 22–32)
Calcium: 9.1 mg/dL (ref 8.9–10.3)
Chloride: 107 mmol/L (ref 98–111)
Creatinine: 0.84 mg/dL (ref 0.44–1.00)
GFR, Estimated: >60 mL/min (ref >60)
Glucose, Bld: 93 mg/dL (ref 70–99)
Potassium: 3.7 mmol/L (ref 3.5–5.1)
Sodium: 140 mmol/L (ref 135–145)
Total Bilirubin: 0.4 mg/dL (ref 0.3–1.2)
Total Protein: 7 g/dL (ref 6.5–8.1)

## 2022-10-01 LAB — CBC WITH DIFFERENTIAL (CANCER CENTER ONLY)
Abs Immature Granulocytes: 0.01 10*3/uL (ref 0.00–0.07)
Basophils Absolute: 0 10*3/uL (ref 0.0–0.1)
Basophils Relative: 0 %
Eosinophils Absolute: 0 10*3/uL (ref 0.0–0.5)
Eosinophils Relative: 1 %
HCT: 30.8 % — ABNORMAL LOW (ref 36.0–46.0)
Hemoglobin: 10.3 g/dL — ABNORMAL LOW (ref 12.0–15.0)
Immature Granulocytes: 0 %
Lymphocytes Relative: 28 %
Lymphs Abs: 1.3 10*3/uL (ref 0.7–4.0)
MCH: 41.5 pg — ABNORMAL HIGH (ref 26.0–34.0)
MCHC: 33.4 g/dL (ref 30.0–36.0)
MCV: 124.2 fL — ABNORMAL HIGH (ref 80.0–100.0)
Monocytes Absolute: 0.7 10*3/uL (ref 0.1–1.0)
Monocytes Relative: 16 %
Neutro Abs: 2.5 10*3/uL (ref 1.7–7.7)
Neutrophils Relative %: 55 %
Platelet Count: 1020 10*3/uL (ref 150–400)
RBC: 2.48 MIL/uL — ABNORMAL LOW (ref 3.87–5.11)
RDW: 13.7 % (ref 11.5–15.5)
WBC Count: 4.5 10*3/uL (ref 4.0–10.5)
nRBC: 0.7 % — ABNORMAL HIGH (ref 0.0–0.2)

## 2022-10-01 LAB — RETICULOCYTES
Immature Retic Fract: 27.9 % — ABNORMAL HIGH (ref 2.3–15.9)
RBC.: 2.42 MIL/uL — ABNORMAL LOW (ref 3.87–5.11)
Retic Count, Absolute: 40.4 10*3/uL (ref 19.0–186.0)
Retic Ct Pct: 1.7 % (ref 0.4–3.1)

## 2022-10-01 LAB — FERRITIN: Ferritin: 389 ng/mL — ABNORMAL HIGH (ref 11–307)

## 2022-10-01 NOTE — Progress Notes (Signed)
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 female is here for continued evaluation and management of her essential thrombocytosis. Patient was last seen by me on 02/19/2022 and reported persistent neck pain, but was otherwise doing well overall.  Today, she complains of pain in neck related to rheumatoid arthritis. She also complains of worsened left LE edema. She also notes that her right ankle began swelling yesterday. Patient endorses pain in bilateral lower extremities. She notes that her edema is improved at night and that her compression socks do not stay elevated on her legs. She notes that she has discontinued Prednisone recently.   She has been tolerating her hydroxyurea without any notable new toxicities. She does take Aspirin regularly. Patient also regularly takes vitamin C, B12, and B complex supplements.  Patient endorses normal p.o. intake and stable weight. She denies any new chest pain, SOB, or abdominal pain. She denies any fever, chills, night sweats, infection issues, or other medication changes besides stopping Prednisone. She endorses stable energy levels.  MEDICAL HISTORY:  Past Medical History:  Diagnosis Date   Arthritis    Cervical stenosis of spine    Dysphasia    Essential hypertension, benign    GERD (gastroesophageal reflux disease)    Glaucoma    Heart murmur    History of estrogen therapy    Hypercholesteremia    IBS (irritable bowel syndrome)    Mitral regurgitation    Pulmonary air embolism (HCC)     states she had this wiht her back surgery   Rheumatoid arthritis (HCC)    Seborrhea    patient denies    SURGICAL HISTORY: Past Surgical History:  Procedure Laterality Date   ANTERIOR AND POSTERIOR REPAIR WITH SACROSPINOUS FIXATION N/A 01/08/2015   Procedure: ANTERIOR AND POSTERIOR REPAIR WITH possible SACROSPINOUS FIXATION;  Surgeon: Richarda Overlie, MD;  Location: [facility];  Service: Gynecology;  Laterality: N/A;   BACK SURGERY     CATARACT EXTRACTION W/ INTRAOCULAR LENS  IMPLANT, BILATERAL Bilateral    COLONOSCOPY     EYE SURGERY     LAPAROSCOPIC ASSISTED VAGINAL HYSTERECTOMY N/A 01/08/2015   Procedure: LAPAROSCOPIC ASSISTED VAGINAL HYSTERECTOMY;  Surgeon: Richarda Overlie, MD;  Location: [facility];  Service: Gynecology;  Laterality: N/A;   LUMBAR FUSION  05/2010   REVERSE SHOULDER ARTHROPLASTY Left 02/16/2014   Procedure: LEFT REVERSE TOTAL SHOULDER ARTHROPLASTY;  Surgeon: Senaida Lange, MD;  Location: [facility];  Service: Orthopedics;  Laterality: Left;   REVERSE SHOULDER ARTHROPLASTY Right 08/07/2016   REVERSE SHOULDER ARTHROPLASTY Right 08/07/2016   Procedure: REVERSE RIGHT SHOULDER ARTHROPLASTY;  Surgeon: Francena Hanly, MD;  Location: [facility];  Service: Orthopedics;  Laterality: Right;   TOE SURGERY     TUBAL LIGATION      SOCIAL HISTORY: Social History   Socioeconomic History   Marital status: Widowed    Spouse name: Not on file   Number of children: 9   Years of education: 7   Highest education level: Not on file  Occupational History   Not on file  Tobacco Use   Smoking status: Never  Smokeless tobacco: Never  Substance and Sexual Activity   Alcohol use: No   Drug use: No   Sexual activity: Never  Other Topics Concern   Not on file  Social History Narrative   Lives with daughter   Caffeine use: none      Social history: She is retired.  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  Does not smoke or consume  alcohol.  She is a widow.       Family history: Mother died of an MI with history of hypertension who was 84 years of age at the time of her event.  1 brother with history of adult onset diabetes, 5 sisters-one of them has diabetes.  1 daughter died of an occult GI malignancy.  1 brother in good health.  She has 9 children.  1 daughter with history of pulmonary sarcoidosis years ago that resolved.   Social Determinants of Health   Financial Resource Strain: Not on file  Food Insecurity: Not on file  Transportation Needs: Not on file  Physical Activity: Not on file  Stress: Not on file  Social Connections: Not on file  Intimate Partner Violence: Not on file    FAMILY HISTORY: Family History  Problem Relation Age of Onset   Healthy Daughter     ALLERGIES:  is allergic to alendronate, allopurinol, amoxicillin-pot clavulanate, folic acid, methotrexate derivatives, latex, and tramadol.  MEDICATIONS:  Current Outpatient Medications  Medication Sig Dispense Refill   aspirin 81 MG tablet Take 81 mg by mouth daily.     atorvastatin (LIPITOR) 20 MG tablet Take 1 tablet (20 mg total) by mouth daily. 100 tablet 2   clotrimazole-betamethasone (LOTRISONE) cream Apply 1 application topically 2 (two) times daily. 45 g 2   hydroxyurea (HYDREA) 500 MG capsule TAKE 3 CAPSULES BY MOUTH MONDAYS,TUESDAY, THURSDAY AND FRIDAYS AND 2 CAPSULES ON WEDNESDAY, SATURDAY, SUNDAYS. TAKE WITH FOOD 90 capsule 2   lansoprazole (PREVACID) 30 MG capsule Take 1 capsule (30 mg total) by mouth daily at 12 noon. 30 capsule    mupirocin ointment (BACTROBAN) 2 % APPLY TOPICALLY TO THE AFFECTED AREA TWICE DAILY AS NEEDED 22 g 11   NEOMYCIN-POLYMYXIN-HYDROCORTISONE (CORTISPORIN) 1 % SOLN OTIC solution Place 3 drops into the left ear 4 (four) times daily. 10 mL 0   polyethylene glycol powder (GLYCOLAX/MIRALAX) powder MIX 1 CAPFUL IN 8 OUNCES OF WATER AND DRINK ONCE DAILY AS DIRECTED 527 g 0   predniSONE (DELTASONE) 10 MG tablet  Take 10 mg by mouth 1 day or 1 dose. (Patient not taking: Reported on 08/28/2022)     No current facility-administered medications for this visit.    REVIEW OF SYSTEMS:    10 Point review of Systems was done is negative except as noted above.   PHYSICAL EXAMINATION: .BP (!) 194/77 (BP Location: Left Arm, Patient Position: Sitting) Comment: notify nurse  Pulse 64   Temp 97.9 F (36.6 C) (Temporal)   Resp 16   Wt 139 lb 1.6 oz (63.1 kg)   SpO2 100%   BMI 27.39 kg/m    GENERAL:alert, in no acute distress and comfortable SKIN: no acute rashes, no significant lesions EYES: conjunctiva are pink and non-injected, sclera anicteric OROPHARYNX: MMM, no exudates, no oropharyngeal erythema or ulceration NECK: supple, no JVD LYMPH:  no palpable lymphadenopathy in the cervical, axillary or inguinal regions LUNGS: clear to auscultation b/l with normal respiratory effort HEART: regular rate & rhythm ABDOMEN:  normoactive bowel sounds , non tender, not distended. Extremity: no pedal edema PSYCH: alert & oriented x 3 with fluent speech NEURO: no focal motor/sensory deficits   LABORATORY DATA:  I have reviewed the data as listed .    Latest Ref Rng & Units 10/01/2022   12:24 PM 07/15/2022   12:00 AM 02/19/2022   10:00 AM  CBC  WBC 4.0 - 10.5 K/uL 4.5  4.8     8.8   Hemoglobin 12.0 - 15.0 g/dL 82.9  56.2     13.0   Hematocrit 36.0 - 46.0 % 30.8  31     30.8   Platelets 150 - 400 K/uL 1,020  1,090     1,089      This result is from an external source.       Latest Ref Rng & Units 10/01/2022   12:24 PM 08/21/2022    9:32 AM 07/15/2022   12:00 AM  CMP  Glucose 70 - 99 mg/dL 93     BUN 8 - 23 mg/dL 19   22      Creatinine 0.44 - 1.00 mg/dL 8.65   1.0      Sodium 135 - 145 mmol/L 140   143      Potassium 3.5 - 5.1 mmol/L 3.7   4.9      Chloride 98 - 111 mmol/L 107   106      CO2 22 - 32 mmol/L 27   25      Calcium 8.9 - 10.3 mg/dL 9.1   9.2      Total Protein 6.5 - 8.1 g/dL 7.0  6.9     Total Bilirubin 0.3 - 1.2 mg/dL 0.4  0.5    Alkaline Phos 38 - 126 U/L 71   82      AST 15 - 41 U/L 22  21  28       ALT 0 - 44 U/L 13  12  36         This result is from an external source.   . Lab Results  Component Value Date   IRON 82 10/01/2022   TIBC 325 10/01/2022   IRONPCTSAT 25 10/01/2022   (Iron and TIBC)  Lab Results  Component Value Date   FERRITIN 389 (H) 10/01/2022       RADIOGRAPHIC STUDIES: I have personally reviewed the radiological images as listed and agreed with the findings in the report. No results found.  ASSESSMENT & PLAN:   82 y.o. 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 CBC showed WBC of 4.5K, hemoglobin of 10.3, and platelets of 1020K. -Some platelets elevated due to reactionary process of chronic inflammation of rheumatoid arthritis -Patient notes no notable toxicities from her current dose of hydroxyurea -continue current dose of Hydroxyurea 1500 mg on 4 days a week and 1000 mg the rest of the days of the week  -continue Aspirin -Continue 2000 IU Vitamin D daily. -Continue Aspirin, B-complex. -Continue to optimize rheumatoid arthritis treatment with rheumatologist to reduce any reactive component to her thrombocytosis. -No indication for additional IV iron at this time -Will order US venous lower extremities b/l  to rule out any blood clot -advised patient to keep her lower extremities elevated to improve swelling -recommend patient to adjust  compression sock size to fit appropriately. Discussed option of [facility] to help with sizing. -Advised patient to regularly use sun protection as Hydroxyurea may cause hypersensitivity to sun exposure  FOLLOW-UP: US venous lower extremities b/l  today/tomorrow to r/o DVT Phone visit in 1 week with Dr Candise Che to discuss [place] results RTC with Dr Candise Che with labs in 3 months  The total time spent in the appointment was 21 minutes* .  All of the patient's questions were answered with apparent satisfaction. The patient knows to call the clinic with any problems, questions or concerns.   Wyvonnia Lora MD MS AAHIVMS [facility] Hematology/Oncology Physician [facility]  .*Total Encounter Time as defined by the Centers for Medicare and Medicaid Services includes, in addition to the face-to-face time of a patient visit (documented in the note above) non-face-to-face time: obtaining and reviewing outside history, ordering and reviewing medications, tests or procedures, care coordination (communications with other health care professionals or caregivers) and documentation in the medical record.    I,Mitra Faeizi,acting as a Neurosurgeon for Wyvonnia Lora, MD.,have documented all relevant documentation on the behalf of Wyvonnia Lora, MD,as directed by  Wyvonnia Lora, MD while in the presence of Wyvonnia Lora, MD.  .I have reviewed the above documentation for accuracy and completeness, and I agree with the above. Johney Maine MD   ADDENDUM  US venous -- no evidence of DVT

## 2022-10-01 NOTE — Progress Notes (Signed)
CRITICAL VALUE STICKER  CRITICAL VALUE: Plts 120  DATE & TIME NOTIFIED: 10/01/22 12:45  MD NOTIFIED: Candise Che  TIME OF NOTIFICATION: 12:50

## 2022-10-02 ENCOUNTER — Telehealth: Payer: Self-pay | Admitting: Hematology

## 2022-10-07 ENCOUNTER — Encounter: Payer: Self-pay | Admitting: Hematology

## 2022-10-10 ENCOUNTER — Inpatient Hospital Stay (HOSPITAL_BASED_OUTPATIENT_CLINIC_OR_DEPARTMENT_OTHER): Payer: Medicare Other | Admitting: Hematology

## 2022-10-10 DIAGNOSIS — M7989 Other specified soft tissue disorders: Secondary | ICD-10-CM

## 2022-10-10 DIAGNOSIS — D473 Essential (hemorrhagic) thrombocythemia: Secondary | ICD-10-CM | POA: Diagnosis not present

## 2022-10-10 NOTE — Progress Notes (Signed)
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 female is here for continued evaluation and management of her essential thrombocytosis. Patient was last seen by me on 10/01/2022 and complained of neck pain related to rheumatoid arthritis, worsened left LE edema, right ankle edema, and pain in bilateral lower extremities.  I connected with Herbert Deaner on 10/10/22 at  8:40 AM EDT by telephone visit and verified that I am speaking with the correct person using two identifiers.   I discussed the limitations, risks, security and privacy concerns of performing an evaluation and management service by telemedicine and the availability of in-person appointments. I also discussed with the patient that there may be a patient responsible charge related to this service. The patient expressed understanding and agreed to proceed.   Other persons participating in the visit and their role in the encounter: daughter   Patient's location: home  Provider's location: [facility]   Chief Complaint: Follow-up for continued evaluation and management of essential thrombocytosis CAL reticulin positive    Today, she reports that she presented to the [facility] and was properly fitted with compression socks to improve her LE symptoms. The results of her recent LE ultrasound were discussed with patient in detail.  MEDICAL HISTORY:  Past Medical History:  Diagnosis Date   Arthritis    Cervical stenosis of spine    Dysphasia    Essential hypertension,  benign    GERD (gastroesophageal reflux disease)    Glaucoma    Heart murmur    History of estrogen therapy    Hypercholesteremia    IBS (irritable bowel syndrome)    Mitral regurgitation    Pulmonary air embolism (HCC)    states she had this wiht her back surgery   Rheumatoid arthritis (HCC)    Seborrhea    patient denies    SURGICAL HISTORY: Past Surgical History:  Procedure Laterality Date   ANTERIOR AND POSTERIOR REPAIR WITH SACROSPINOUS FIXATION N/A 01/08/2015   Procedure: ANTERIOR AND POSTERIOR REPAIR WITH possible SACROSPINOUS FIXATION;  Surgeon: Richarda Overlie, MD;  Location: [facility];  Service: Gynecology;  Laterality: N/A;   BACK SURGERY     CATARACT EXTRACTION W/ INTRAOCULAR LENS  IMPLANT, BILATERAL Bilateral    COLONOSCOPY     EYE SURGERY     LAPAROSCOPIC ASSISTED VAGINAL HYSTERECTOMY N/A 01/08/2015   Procedure: LAPAROSCOPIC ASSISTED VAGINAL HYSTERECTOMY;  Surgeon: Richarda Overlie, MD;  Location: [facility];  Service: Gynecology;  Laterality: N/A;   LUMBAR FUSION  05/2010   REVERSE SHOULDER ARTHROPLASTY Left 02/16/2014   Procedure: LEFT REVERSE TOTAL SHOULDER ARTHROPLASTY;  Surgeon: Senaida Lange, MD;  Location: [facility];  Service: Orthopedics;  Laterality: Left;   REVERSE SHOULDER ARTHROPLASTY Right 08/07/2016   REVERSE SHOULDER ARTHROPLASTY Right 08/07/2016   Procedure: REVERSE RIGHT SHOULDER ARTHROPLASTY;  Surgeon: Francena Hanly, MD;  Location: [facility];  Service: Orthopedics;  Laterality: Right;   TOE SURGERY     TUBAL LIGATION      SOCIAL HISTORY: Social History   Socioeconomic History   Marital status: Widowed    Spouse name: Not on  file   Number of children: 9   Years of education: 7   Highest education level: Not on file  Occupational History   Not on file  Tobacco Use   Smoking status: Never   Smokeless tobacco: Never  Substance and Sexual Activity   Alcohol use: No   Drug use: No   Sexual activity: Never  Other Topics Concern   Not on file  Social History  Narrative   Lives with daughter   Caffeine use: none      Social history: She is retired.  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  Does not smoke or consume alcohol.  She is a widow.       Family history: Mother died of an MI with history of hypertension who was 84 years of age at the time of her event.  1 brother with history of adult onset diabetes, 5 sisters-one of them has diabetes.  1 daughter died of an occult GI malignancy.  1 brother in good health.  She has 9 children.  1 daughter with history of pulmonary sarcoidosis years ago that resolved.   Social Determinants of Health   Financial Resource Strain: Not on file  Food Insecurity: Not on file  Transportation Needs: Not on file  Physical Activity: Not on file  Stress: Not on file  Social Connections: Not on file  Intimate Partner Violence: Not on file    FAMILY HISTORY: Family History  Problem Relation Age of Onset   Healthy Daughter     ALLERGIES:  is allergic to alendronate, allopurinol, amoxicillin-pot clavulanate, folic acid, methotrexate derivatives, latex, and tramadol.  MEDICATIONS:  Current Outpatient Medications  Medication Sig Dispense Refill   aspirin 81 MG tablet Take 81 mg by mouth daily.     atorvastatin (LIPITOR) 20 MG tablet Take 1 tablet (20 mg total) by mouth daily. 100 tablet 2   clotrimazole-betamethasone (LOTRISONE) cream Apply 1 application topically 2 (two) times daily. 45 g 2   hydroxyurea (HYDREA) 500 MG capsule TAKE 3 CAPSULES BY MOUTH MONDAYS,TUESDAY, THURSDAY AND FRIDAYS AND 2 CAPSULES ON WEDNESDAY, SATURDAY, SUNDAYS. TAKE WITH FOOD 90 capsule 2   lansoprazole (PREVACID) 30 MG capsule Take 1 capsule (30 mg total) by mouth daily at 12 noon. 30 capsule    mupirocin ointment (BACTROBAN) 2 % APPLY TOPICALLY TO THE AFFECTED AREA TWICE DAILY AS NEEDED 22 g 11   NEOMYCIN-POLYMYXIN-HYDROCORTISONE (CORTISPORIN) 1 % SOLN OTIC solution  Place 3 drops into the left ear 4 (four) times daily. 10 mL 0   polyethylene glycol powder (GLYCOLAX/MIRALAX) powder MIX 1 CAPFUL IN 8 OUNCES OF WATER AND DRINK ONCE DAILY AS DIRECTED 527 g 0   predniSONE (DELTASONE) 10 MG tablet Take 10 mg by mouth 1 day or 1 dose. (Patient not taking: Reported on 08/28/2022)     No current facility-administered medications for this visit.    REVIEW OF SYSTEMS:    10 Point review of Systems was done is negative except as noted above.   PHYSICAL EXAMINATION: TELEMEDICINE VISIT .There were no vitals taken for this visit.  LABORATORY DATA:  I have reviewed the data as listed .    Latest Ref Rng & Units 10/01/2022   12:24 PM 07/15/2022   12:00 AM 02/19/2022   10:00 AM  CBC  WBC 4.0 - 10.5 K/uL 4.5  4.8     8.8   Hemoglobin 12.0 - 15.0 g/dL 21.3  08.6  10.7   Hematocrit 36.0 - 46.0 % 30.8  31     30.8   Platelets 150 - 400 K/uL 1,020  1,090     1,089      This result is from an external source.       Latest Ref Rng & Units 10/01/2022   12:24 PM 08/21/2022    9:32 AM 07/15/2022   12:00 AM  CMP  Glucose 70 - 99 mg/dL 93     BUN 8 - 23 mg/dL 19   22      Creatinine 0.44 - 1.00 mg/dL 1.61   1.0      Sodium 135 - 145 mmol/L 140   143      Potassium 3.5 - 5.1 mmol/L 3.7   4.9      Chloride 98 - 111 mmol/L 107   106      CO2 22 - 32 mmol/L 27   25      Calcium 8.9 - 10.3 mg/dL 9.1   9.2      Total Protein 6.5 - 8.1 g/dL 7.0  6.9    Total Bilirubin 0.3 - 1.2 mg/dL 0.4  0.5    Alkaline Phos 38 - 126 U/L 71   82      AST 15 - 41 U/L 22  21  28       ALT 0 - 44 U/L 13  12  36         This result is from an external source.   . Lab Results  Component Value Date   IRON 82 10/01/2022   TIBC 325 10/01/2022   IRONPCTSAT 25 10/01/2022   (Iron and TIBC)  Lab Results  Component Value Date   FERRITIN 389 (H) 10/01/2022       RADIOGRAPHIC STUDIES: I have personally reviewed the radiological images as listed and agreed with the findings in the  report. VAS [place] LOWER EXTREMITY VENOUS (DVT)  Result Date: 10/01/2022  Lower Venous DVT Study Patient Name:  BIAK VOELLER [facility]  Date of Exam:   10/01/2022 Medical Rec #: [id number]        Accession #:    [id number] Date of Birth: 1941/05/22         Patient Gender: F Patient Age:   13 years Exam Location:  [facility] Procedure:      VAS [place] LOWER EXTREMITY VENOUS (DVT) Referring Phys: Wyvonnia Lora --------------------------------------------------------------------------------  Indications: Swelling.  Risk Factors: Thrombocythemia. Comparison Study: No previous exams Performing Technologist: Jody Hill RVT, RDMS  Examination Guidelines: A complete evaluation includes B-mode imaging, spectral Doppler, color Doppler, and power Doppler as needed of all accessible portions of each vessel. Bilateral testing is considered an integral part of a complete examination. Limited examinations for reoccurring indications may be performed as noted. The reflux portion of the exam is performed with the patient in reverse Trendelenburg.  +---------+---------------+---------+-----------+----------+--------------+ RIGHT    CompressibilityPhasicitySpontaneityPropertiesThrombus Aging +---------+---------------+---------+-----------+----------+--------------+ CFV      Full           Yes      Yes                                 +---------+---------------+---------+-----------+----------+--------------+ SFJ      Full                                                        +---------+---------------+---------+-----------+----------+--------------+  FV Prox  Full           Yes      Yes                                 +---------+---------------+---------+-----------+----------+--------------+ FV Mid   Full           Yes      Yes                                 +---------+---------------+---------+-----------+----------+--------------+ FV DistalFull           Yes      Yes                                  +---------+---------------+---------+-----------+----------+--------------+ PFV      Full                                                        +---------+---------------+---------+-----------+----------+--------------+ POP      Full           Yes      Yes                                 +---------+---------------+---------+-----------+----------+--------------+ PTV      Full                                                        +---------+---------------+---------+-----------+----------+--------------+ PERO     Full                                                        +---------+---------------+---------+-----------+----------+--------------+   +---------+---------------+---------+-----------+----------+--------------+ LEFT     CompressibilityPhasicitySpontaneityPropertiesThrombus Aging +---------+---------------+---------+-----------+----------+--------------+ CFV      Full           Yes      Yes                                 +---------+---------------+---------+-----------+----------+--------------+ SFJ      Full                                                        +---------+---------------+---------+-----------+----------+--------------+ FV Prox  Full           Yes      Yes                                 +---------+---------------+---------+-----------+----------+--------------+ FV Mid   Full  Yes      Yes                                 +---------+---------------+---------+-----------+----------+--------------+ FV DistalFull           Yes      Yes                                 +---------+---------------+---------+-----------+----------+--------------+ PFV      Full                                                        +---------+---------------+---------+-----------+----------+--------------+ POP      Full           Yes      Yes                                  +---------+---------------+---------+-----------+----------+--------------+ PTV      Full                                                        +---------+---------------+---------+-----------+----------+--------------+ PERO     Full                                                        +---------+---------------+---------+-----------+----------+--------------+     Summary: BILATERAL: - No evidence of deep vein thrombosis seen in the lower extremities, bilaterally. -No evidence of popliteal cyst, bilaterally.  LEFT: Subcutaneous edema seen in area of calf and ankle.  *See table(s) above for measurements and observations. Electronically signed by Lemar Livings MD on 10/01/2022 at 4:46:32 PM.    Final     ASSESSMENT & PLAN:   82 y.o. 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 No evidence of popliteal cyst, bilaterally. Subcutaneous edema seen in area of calf and ankle. -edema may be due to valves of veins not working efficiently -recommend patient to regularly use compression socks, continue to elevate lower extremities regularly, and limit salt intake to improve LE swelling -continue current dose of Hydroxyurea 1500 mg on 4 days a week and 1000 mg the rest of the days of the week  -Continue 2000 IU Vitamin D daily. -Continue Aspirin, B-complex. -Continue to optimize rheumatoid arthritis treatment with rheumatologist to reduce any reactive component to her thrombocytosis. -No indication for additional IV iron at this time -return to clinic in 3 months  FOLLOW-UP: ***  The total time spent in the appointment was *** minutes* .  All of the  patient's questions were  answered with apparent satisfaction. The patient knows to call the clinic with any problems, questions or concerns.   Wyvonnia Lora MD MS AAHIVMS [facility] Hematology/Oncology Physician [facility]  .*Total Encounter Time as defined by the Centers for Medicare and Medicaid Services includes, in addition to the face-to-face time of a patient visit (documented in the note above) non-face-to-face time: obtaining and reviewing outside history, ordering and reviewing medications, tests or procedures, care coordination (communications with other health care professionals or caregivers) and documentation in the medical record.    I,Mitra Faeizi,acting as a Neurosurgeon for Wyvonnia Lora, MD.,have documented all relevant documentation on the behalf of Wyvonnia Lora, MD,as directed by  Wyvonnia Lora, MD while in the presence of Wyvonnia Lora, MD.  ***

## 2022-10-15 ENCOUNTER — Encounter: Payer: Self-pay | Admitting: Hematology

## 2022-11-04 DIAGNOSIS — G5601 Carpal tunnel syndrome, right upper limb: Secondary | ICD-10-CM | POA: Diagnosis not present

## 2022-11-04 DIAGNOSIS — Z8739 Personal history of other diseases of the musculoskeletal system and connective tissue: Secondary | ICD-10-CM | POA: Diagnosis not present

## 2022-11-04 DIAGNOSIS — M1990 Unspecified osteoarthritis, unspecified site: Secondary | ICD-10-CM | POA: Diagnosis not present

## 2022-11-04 DIAGNOSIS — D473 Essential (hemorrhagic) thrombocythemia: Secondary | ICD-10-CM | POA: Diagnosis not present

## 2022-11-04 DIAGNOSIS — M542 Cervicalgia: Secondary | ICD-10-CM | POA: Diagnosis not present

## 2022-11-04 DIAGNOSIS — M810 Age-related osteoporosis without current pathological fracture: Secondary | ICD-10-CM | POA: Diagnosis not present

## 2022-11-04 DIAGNOSIS — M25511 Pain in right shoulder: Secondary | ICD-10-CM | POA: Diagnosis not present

## 2022-11-04 DIAGNOSIS — I10 Essential (primary) hypertension: Secondary | ICD-10-CM | POA: Diagnosis not present

## 2022-11-04 DIAGNOSIS — M059 Rheumatoid arthritis with rheumatoid factor, unspecified: Secondary | ICD-10-CM | POA: Diagnosis not present

## 2022-11-13 DIAGNOSIS — H402211 Chronic angle-closure glaucoma, right eye, mild stage: Secondary | ICD-10-CM | POA: Diagnosis not present

## 2022-11-13 DIAGNOSIS — H402222 Chronic angle-closure glaucoma, left eye, moderate stage: Secondary | ICD-10-CM | POA: Diagnosis not present

## 2022-12-15 ENCOUNTER — Telehealth: Payer: Self-pay | Admitting: Internal Medicine

## 2022-12-15 MED ORDER — MUPIROCIN 2 % EX OINT: TOPICAL_OINTMENT | CUTANEOUS | 11 refills | Status: DC

## 2022-12-15 NOTE — Telephone Encounter (Signed)
[name] [phone]  [name] called to say she was out of below medication.  mupirocin ointment (BACTROBAN) 2 %   [id number] [place], [place] - [address] AT [facility] Phone: [phone]  Fax: [phone]

## 2022-12-31 ENCOUNTER — Other Ambulatory Visit: Payer: Self-pay | Admitting: Hematology

## 2022-12-31 DIAGNOSIS — D473 Essential (hemorrhagic) thrombocythemia: Secondary | ICD-10-CM

## 2023-01-02 ENCOUNTER — Other Ambulatory Visit: Payer: Self-pay

## 2023-01-02 DIAGNOSIS — D473 Essential (hemorrhagic) thrombocythemia: Secondary | ICD-10-CM

## 2023-01-02 MED ORDER — HYDROXYUREA 500 MG PO CAPS: ORAL_CAPSULE | ORAL | 2 refills | Status: DC

## 2023-01-06 ENCOUNTER — Ambulatory Visit: Payer: Medicare Other | Admitting: Hematology

## 2023-01-06 ENCOUNTER — Other Ambulatory Visit: Payer: Medicare Other

## 2023-01-06 ENCOUNTER — Other Ambulatory Visit: Payer: Self-pay

## 2023-01-06 DIAGNOSIS — D473 Essential (hemorrhagic) thrombocythemia: Secondary | ICD-10-CM

## 2023-01-07 ENCOUNTER — Inpatient Hospital Stay: Payer: Medicare Other | Attending: Hematology

## 2023-01-07 ENCOUNTER — Other Ambulatory Visit: Payer: Self-pay

## 2023-01-07 ENCOUNTER — Inpatient Hospital Stay: Payer: Medicare Other | Admitting: Hematology

## 2023-01-07 VITALS — BP 170/66 | HR 70 | Temp 97.7°F | Resp 18 | Wt 134.6 lb

## 2023-01-07 DIAGNOSIS — D473 Essential (hemorrhagic) thrombocythemia: Secondary | ICD-10-CM

## 2023-01-07 DIAGNOSIS — Z79899 Other long term (current) drug therapy: Secondary | ICD-10-CM | POA: Diagnosis not present

## 2023-01-07 DIAGNOSIS — Z7982 Long term (current) use of aspirin: Secondary | ICD-10-CM | POA: Diagnosis not present

## 2023-01-07 LAB — CBC WITH DIFFERENTIAL (CANCER CENTER ONLY)
Abs Immature Granulocytes: 0.03 10*3/uL (ref 0.00–0.07)
Basophils Absolute: 0 10*3/uL (ref 0.0–0.1)
Basophils Relative: 1 %
Eosinophils Absolute: 0.1 10*3/uL (ref 0.0–0.5)
Eosinophils Relative: 1 %
HCT: 30 % — ABNORMAL LOW (ref 36.0–46.0)
Hemoglobin: 10.3 g/dL — ABNORMAL LOW (ref 12.0–15.0)
Immature Granulocytes: 1 %
Lymphocytes Relative: 21 %
Lymphs Abs: 1.3 10*3/uL (ref 0.7–4.0)
MCH: 42.4 pg — ABNORMAL HIGH (ref 26.0–34.0)
MCHC: 34.3 g/dL (ref 30.0–36.0)
MCV: 123.5 fL — ABNORMAL HIGH (ref 80.0–100.0)
Monocytes Absolute: 1 10*3/uL (ref 0.1–1.0)
Monocytes Relative: 17 %
Neutro Abs: 3.6 10*3/uL (ref 1.7–7.7)
Neutrophils Relative %: 59 %
Platelet Count: 1048 10*3/uL (ref 150–400)
RBC: 2.43 MIL/uL — ABNORMAL LOW (ref 3.87–5.11)
RDW: 14 % (ref 11.5–15.5)
WBC Count: 6 10*3/uL (ref 4.0–10.5)
nRBC: 1.5 % — ABNORMAL HIGH (ref 0.0–0.2)

## 2023-01-07 LAB — CMP (CANCER CENTER ONLY)
ALT: 24 U/L (ref 0–44)
AST: 29 U/L (ref 15–41)
Albumin: 4.1 g/dL (ref 3.5–5.0)
Alkaline Phosphatase: 68 U/L (ref 38–126)
Anion gap: 4 — ABNORMAL LOW (ref 5–15)
BUN: 26 mg/dL — ABNORMAL HIGH (ref 8–23)
CO2: 29 mmol/L (ref 22–32)
Calcium: 9.2 mg/dL (ref 8.9–10.3)
Chloride: 106 mmol/L (ref 98–111)
Creatinine: 0.84 mg/dL (ref 0.44–1.00)
GFR, Estimated: >60 mL/min (ref >60)
Glucose, Bld: 73 mg/dL (ref 70–99)
Potassium: 3.9 mmol/L (ref 3.5–5.1)
Sodium: 139 mmol/L (ref 135–145)
Total Bilirubin: 0.4 mg/dL (ref 0.3–1.2)
Total Protein: 7 g/dL (ref 6.5–8.1)

## 2023-01-07 LAB — RETICULOCYTES
Immature Retic Fract: 30 % — ABNORMAL HIGH (ref 2.3–15.9)
RBC.: 2.44 MIL/uL — ABNORMAL LOW (ref 3.87–5.11)
Retic Count, Absolute: 52.2 10*3/uL (ref 19.0–186.0)
Retic Ct Pct: 2.1 % (ref 0.4–3.1)

## 2023-01-07 LAB — IRON AND IRON BINDING CAPACITY (CC-WL,HP ONLY)
Iron: 95 ug/dL (ref 28–170)
Saturation Ratios: 29 % (ref 10.4–31.8)
TIBC: 332 ug/dL (ref 250–450)
UIBC: 237 ug/dL (ref 148–442)

## 2023-01-07 LAB — FERRITIN: Ferritin: 475 ng/mL — ABNORMAL HIGH (ref 11–307)

## 2023-01-07 MED ORDER — HYDROXYUREA 500 MG PO CAPS
500.0000 mg | ORAL_CAPSULE | Freq: Every day | ORAL | 0 refills | Status: DC
Start: 2023-01-07 — End: 2023-04-03

## 2023-01-07 MED ORDER — HYDROXYUREA 500 MG PO CAPS: ORAL_CAPSULE | ORAL | 2 refills | Status: DC

## 2023-01-14 ENCOUNTER — Encounter: Payer: Self-pay | Admitting: Hematology

## 2023-01-14 NOTE — Progress Notes (Signed)
HEMATOLOGY/ONCOLOGY CLINIC VISIT NOTE  Date of Service: 01/07/2023    Patient Care Team: Margaree Mackintosh, MD as PCP - Ricky Stabs, MD as Consulting Physician (Gastroenterology) Rossie Muskrat, MD (Rheumatology)  CHIEF COMPLAINTS/PURPOSE OF CONSULTATION:  Follow-up for continued evaluation and management of Essential thrombocytosis Caleticulin positive  HISTORY OF PRESENTING ILLNESS:  Previous notes for details on initial presentation  INTERVAL HISTORY:  [name] is a 82 y.o. female who is here for continued evaluation and management of her Calreticulin positive essential thrombocytosis..  Notes no acute new symptoms.  No new leg swelling.  No new chest pain or shortness of breath.  Continues to take her aspirin regularly.  No notable intolerance to her current dose of hydroxyurea.  MEDICAL HISTORY:  Past Medical History:  Diagnosis Date   Arthritis    Cervical stenosis of spine    Dysphasia    Essential hypertension, benign    GERD (gastroesophageal reflux disease)    Glaucoma    Heart murmur    History of estrogen therapy    Hypercholesteremia    IBS (irritable bowel syndrome)    Mitral regurgitation    Pulmonary air embolism (HCC)    states she had this wiht her back surgery   Rheumatoid arthritis (HCC)    Seborrhea    patient denies    SURGICAL HISTORY: Past Surgical History:  Procedure Laterality Date   ANTERIOR AND POSTERIOR REPAIR WITH SACROSPINOUS FIXATION N/A 01/08/2015   Procedure: ANTERIOR AND POSTERIOR REPAIR WITH possible SACROSPINOUS FIXATION;  Surgeon: Richarda Overlie, MD;  Location: [facility];  Service: Gynecology;  Laterality: N/A;   BACK SURGERY     CATARACT EXTRACTION W/ INTRAOCULAR LENS  IMPLANT, BILATERAL Bilateral    COLONOSCOPY     EYE SURGERY     LAPAROSCOPIC ASSISTED VAGINAL HYSTERECTOMY N/A 01/08/2015   Procedure: LAPAROSCOPIC ASSISTED VAGINAL HYSTERECTOMY;  Surgeon: Richarda Overlie, MD;  Location: [facility];  Service:  Gynecology;  Laterality: N/A;   LUMBAR FUSION  05/2010   REVERSE SHOULDER ARTHROPLASTY Left 02/16/2014   Procedure: LEFT REVERSE TOTAL SHOULDER ARTHROPLASTY;  Surgeon: Senaida Lange, MD;  Location: [facility];  Service: Orthopedics;  Laterality: Left;   REVERSE SHOULDER ARTHROPLASTY Right 08/07/2016   REVERSE SHOULDER ARTHROPLASTY Right 08/07/2016   Procedure: REVERSE RIGHT SHOULDER ARTHROPLASTY;  Surgeon: Francena Hanly, MD;  Location: [facility];  Service: Orthopedics;  Laterality: Right;   TOE SURGERY     TUBAL LIGATION      SOCIAL HISTORY: Social History   Socioeconomic History   Marital status: Widowed    Spouse name: Not on file   Number of children: 9   Years of education: 7   Highest education level: Not on file  Occupational History   Not on file  Tobacco Use   Smoking status: Never   Smokeless tobacco: Never  Substance and Sexual Activity   Alcohol use: No   Drug use: No   Sexual activity: Never  Other Topics Concern   Not on file  Social History Narrative   Lives with daughter   Caffeine use: none      Social history: She is retired.  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  Does not smoke or consume alcohol.  She is a widow.       Family history: Mother died of an MI with history of hypertension who was 84 years of age at the time of  her event.  1 brother with history of adult onset diabetes, 5 sisters-one of them has diabetes.  1 daughter died of an occult GI malignancy.  1 brother in good health.  She has 9 children.  1 daughter with history of pulmonary sarcoidosis years ago that resolved.   Social Determinants of Health   Financial Resource Strain: Not on file  Food Insecurity: Not on file  Transportation Needs: Not on file  Physical Activity: Not on file  Stress: Not on file  Social Connections: Not on file  Intimate Partner Violence: Not on file    FAMILY HISTORY: Family History  Problem Relation  Age of Onset   Healthy Daughter     ALLERGIES:  is allergic to alendronate, allopurinol, amoxicillin-pot clavulanate, folic acid, methotrexate derivatives, latex, and tramadol.  MEDICATIONS:  Current Outpatient Medications  Medication Sig Dispense Refill   aspirin 81 MG tablet Take 81 mg by mouth daily.     atorvastatin (LIPITOR) 20 MG tablet Take 1 tablet (20 mg total) by mouth daily. 100 tablet 2   clotrimazole-betamethasone (LOTRISONE) cream Apply 1 application topically 2 (two) times daily. 45 g 2   hydroxyurea (HYDREA) 500 MG capsule TAKE 3 CAPSULES BY MOUTH MONDAYS TUESDAY THURSDAY AND FRIDAYS AND 2 CAPSULES BY MOUTH ON WEDNESDAY SATURDAY SUNDAYS TAKE WITH FOOD 90 capsule 2   hydroxyurea (HYDREA) 500 MG capsule Take 1 capsule (500 mg total) by mouth daily. TAKE 3 CAPSULES BY MOUTH MONDAYS TUESDAY THURSDAY AND FRIDAYS AND 2 CAPSULES BY MOUTH ON WEDNESDAY SATURDAY SUNDAYS TAKE WITH FOOD, 15 capsule 0   lansoprazole (PREVACID) 30 MG capsule Take 1 capsule (30 mg total) by mouth daily at 12 noon. 30 capsule    mupirocin ointment (BACTROBAN) 2 % APPLY TOPICALLY TO THE AFFECTED AREA TWICE DAILY AS NEEDED 22 g 11   NEOMYCIN-POLYMYXIN-HYDROCORTISONE (CORTISPORIN) 1 % SOLN OTIC solution Place 3 drops into the left ear 4 (four) times daily. 10 mL 0   polyethylene glycol powder (GLYCOLAX/MIRALAX) powder MIX 1 CAPFUL IN 8 OUNCES OF WATER AND DRINK ONCE DAILY AS DIRECTED 527 g 0   predniSONE (DELTASONE) 10 MG tablet Take 10 mg by mouth 1 day or 1 dose. (Patient not taking: Reported on 08/28/2022)     No current facility-administered medications for this visit.    REVIEW OF SYSTEMS:    10 Point review of Systems was done is negative except as noted above.   PHYSICAL EXAMINATION: TELEMEDICINE VISIT   LABORATORY DATA:  I have reviewed the data as listed .    Latest Ref Rng & Units 01/07/2023    9:17 AM 10/01/2022   12:24 PM 07/15/2022   12:00 AM  CBC  WBC 4.0 - 10.5 K/uL 6.0  4.5  4.8       Hemoglobin 12.0 - 15.0 g/dL 55.7  32.2  02.5      Hematocrit 36.0 - 46.0 % 30.0  30.8  31      Platelets 150 - 400 K/uL 1,048  1,020  1,090         This result is from an external source.       Latest Ref Rng & Units 01/07/2023    9:17 AM 10/01/2022   12:24 PM 08/21/2022    9:32 AM  CMP  Glucose 70 - 99 mg/dL 73  93    BUN 8 - 23 mg/dL 26  19    Creatinine 4.27 - 1.00 mg/dL 0.62  3.76    Sodium 283 - 145  mmol/L 139  140    Potassium 3.5 - 5.1 mmol/L 3.9  3.7    Chloride 98 - 111 mmol/L 106  107    CO2 22 - 32 mmol/L 29  27    Calcium 8.9 - 10.3 mg/dL 9.2  9.1    Total Protein 6.5 - 8.1 g/dL 7.0  7.0  6.9   Total Bilirubin 0.3 - 1.2 mg/dL 0.4  0.4  0.5   Alkaline Phos 38 - 126 U/L 68  71    AST 15 - 41 U/L 29  22  21    ALT 0 - 44 U/L 24  13  12     . Lab Results  Component Value Date   IRON 95 01/07/2023   TIBC 332 01/07/2023   IRONPCTSAT 29 01/07/2023   (Iron and TIBC)  Lab Results  Component Value Date   FERRITIN 475 (H) 01/07/2023       RADIOGRAPHIC STUDIES: I have personally reviewed the radiological images as listed and agreed with the findings in the report. No results found.  ASSESSMENT & PLAN:   82 y.o. 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  CBC shows platelets of nearly 1 million. CMP stable -No notable toxicities from current dose of hydroxyurea -continue current dose of Hydroxyurea 1500 mg on 4 days a week and 1000 mg the rest of the days of the week  -Continue 2000 IU Vitamin D daily. -Continue Aspirin, B-complex. -Continue to optimize rheumatoid arthritis treatment with rheumatologist to reduce any reactive component to her thrombocytosis. -No indication for additional IV iron at this time -return to clinic in 3  months  FOLLOW-UP: RTC with Dr Candise Che with labs in 3 months  The total time spent in the appointment was 20 minutes*.  All of the patient's questions were answered with apparent satisfaction. The patient knows to call the clinic with any problems, questions or concerns.   Wyvonnia Lora MD MS AAHIVMS [facility] Hematology/Oncology Physician [address] [facility]  .*Total Encounter Time as defined by the Centers for Medicare and Medicaid Services includes, in addition to the face-to-face time of a patient visit (documented in the note above) non-face-to-face time: obtaining and reviewing outside history, ordering and reviewing medications, tests or procedures, care coordination (communications with other health care professionals or caregivers) and documentation in the medical record.

## 2023-01-15 DIAGNOSIS — Z1231 Encounter for screening mammogram for malignant neoplasm of breast: Secondary | ICD-10-CM | POA: Diagnosis not present

## 2023-01-15 LAB — HM MAMMOGRAPHY

## 2023-01-20 ENCOUNTER — Encounter: Payer: Self-pay | Admitting: Internal Medicine

## 2023-02-13 DIAGNOSIS — H402222 Chronic angle-closure glaucoma, left eye, moderate stage: Secondary | ICD-10-CM | POA: Diagnosis not present

## 2023-02-13 DIAGNOSIS — H402211 Chronic angle-closure glaucoma, right eye, mild stage: Secondary | ICD-10-CM | POA: Diagnosis not present

## 2023-02-24 NOTE — Progress Notes (Signed)
Annual Wellness Visit    Patient Care Team: Keeton Kassebaum, Luanna Cole, MD as PCP - Ricky Stabs, MD as Consulting Physician (Gastroenterology) Rossie Muskrat, MD (Rheumatology)  Visit Date: 03/03/23   Chief Complaint  Patient presents with   Medicare Wellness   Annual Exam    Subjective:   Patient: [name], Female    DOB: 1941/01/10, 82 y.o.   MRN: [id number]  [name] is a 82 y.o. Female who presents today for her Annual Medicare Wellness Visit.Also presents for health maintenance exam. History of essential hypertension,  essential thrombocytosis,GERD, glaucoma, heart murmur, irritable bowel syndrome, mitral regurgitation, Rheumatoid arthritis, cervical spinal stenosis.    History of hyperlipidemia treated with atorvastatin 20 mg daily. Lipid panel normal.   History of impaired glucose tolerance. HGBA1c at 6.1% on 02/27/23, up from 5.8% on 08/21/22.Needs to watch diet more closely.  Denies rashes, GI symptoms, chest pain.  Continues to be seen by rheumatologist, Dr. Kathi Ludwig. She has a history of seropositive rheumatoid arthritis. Has osteoarthritis in addition to rheumatoid arthritis.  Remote history of gout but no recent flare in the number of years.  Rheumatoid arthritis was diagnosed in 2013.  She was tried on methotrexate but that was stopped due to sores in her mouth despite taking folic acid.  She tried leflunomide but could not tolerate it.  DMARDs were expensive.  Had bilateral knee aspiration and injection in July 2019 with good relief.  Fluid analysis showed white blood cells more than 27,000 consistent with inflammatory fluid and no crystals. She has left-sided neck pain. Reports Tylenol does not help. She is not taking NSAID's.  History of essential thrombocytosis.  Followed by Dr. Candise Che for this as well as mild anemia and macrocytosis.  Takes Hydrea for this. RBC low at 2.5 million, hemoglobin low at 9.9, HCT low at 29.5, MCV elevated at 118, MCH elevated at  39.6, platelets elevated at 941,000.  Given Prevacid 30 mg daily per Dr. Candise Che. Longstanding history of GE reflux, remote history of esophageal stricture and gastritis.   History of osteoporosis without fracture.   Multiple drug intolerances including Orencia which causes a rash, folic acid causes a rash, Fosamax causes GE reflux, methotrexate caused swelling and sores.  Allopurinol caused stomach pain and GI upset.  Also reports intolerance to tramadol and Augmentin.   Has annual diabetic eye exam.  Seen by [facility] ophthalmology.   Underwent right S1-S2 laminectomy with decompression lumbar laminectomy L4-L5 and L5-S1 as well as S1-S2 in January 2012 by Dr. Marikay Alar.  Postoperatively she had a pulmonary embolus.  After that she had a viral syndrome and had to be hospitalized in February 2012.   History of H. pylori gastritis in 2000.  Compression fracture T7 in 2000.  History of abnormal Pap smear in 1995 with colposcopy.  Bilateral tubal ligation 1979.  D&C for menorrhagia in 1989.  Marsupialization of Bartholin cyst 1987.   Has been evaluated by hematologist for anemia around 2017 (Dr. Darnelle Catalan) and was felt to have anemia of chronic disease due to chronic kidney disease.  Initially her anemia was noted after laparoscopic vaginal hysterectomy, BSO and A and P repair August 2016 by Dr. Marcelle Overlie.  MCV was 97.7 with platelet count 603,000 and normal white blood cell count.  In January 2017 she had an iron level was 21 and was placed on iron supplement which improved her iron level to 76 by April 2017.  She was seen by gastroenterologist because  of iron deficiency anemia and had colonoscopy by Dr. Ewing Schlein in 2014 showing chronic diverticulosis.  Endoscopy at the same time showed esophageal stricture and gastritis.  In February 2017 she had small hiatal hernia and chronic gastritis which was biopsied at [facility].  Multiple gastric polyps were biopsied.  Chronic changes consistent with PPI therapy noted.   Pathology was negative for Helicobacter pylori and she had no evidence of malignancy.  Labs reviewed today. BUN elevated at 26. Potassium elevated at 5.7. This is likely a lab error. Glucose normal. Liver functions normal. Total proteins normal. TSH at 2.73.  Mammogram last completed 01/15/23. No mammographic evidence of malignancy. Repeat recommended in 2025.  Social history: She is a widow. 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 She resides with her daughter. She does not smoke or consume alcohol.   Past Medical History:  Diagnosis Date   Arthritis    Cervical stenosis of spine    Dysphasia    Essential hypertension, benign    GERD (gastroesophageal reflux disease)    Glaucoma    Heart murmur    History of estrogen therapy    Hypercholesteremia    IBS (irritable bowel syndrome)    Mitral regurgitation    Pulmonary air embolism (HCC)    states she had this wiht her back surgery   Rheumatoid arthritis (HCC)    Seborrhea    patient denies     Family History  Problem Relation Age of Onset   Healthy Daughter      Social History   Social History Narrative   Lives with daughter   Caffeine use: none      Social history: She is retired.  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  Does not smoke or consume alcohol.  She is a widow.       Family history: Mother died of an MI with history of hypertension who was 84 years of age at the time of her event.  1 brother with history of adult onset diabetes, 5 sisters-one of them has diabetes.  1 daughter died of an occult GI malignancy.  1 brother in good health.  She has 9 children.  1 daughter with history of pulmonary sarcoidosis years ago that resolved.     Review of Systems  Constitutional:  Negative for chills, fever, malaise/fatigue and weight loss.  HENT:  Negative for hearing loss, sinus pain and sore throat.   Respiratory:   Negative for cough, hemoptysis and shortness of breath.   Cardiovascular:  Negative for chest pain, palpitations, leg swelling and PND.  Gastrointestinal:  Negative for abdominal pain, constipation, diarrhea, heartburn, nausea and vomiting.  Genitourinary:  Negative for dysuria, frequency and urgency.  Musculoskeletal:  Positive for neck pain. Negative for back pain and myalgias.  Skin:  Negative for itching and rash.  Neurological:  Negative for dizziness, tingling, seizures and headaches.  Endo/Heme/Allergies:  Negative for polydipsia.  Psychiatric/Behavioral:  Negative for depression. The patient is not nervous/anxious.       Objective:   Vitals: BP 136/80   Pulse 78   Ht 4' 11.75" (1.518 m)   Wt 139 lb (63 kg)   SpO2 97%   BMI 27.37 kg/m   Physical Exam Vitals and nursing note reviewed.  Constitutional:      General: She is not in acute distress.    Appearance: Normal appearance. She is not ill-appearing or  toxic-appearing.  HENT:     Head: Normocephalic and atraumatic.     Right Ear: Hearing, tympanic membrane, ear canal and external ear normal.     Left Ear: Hearing, tympanic membrane, ear canal and external ear normal.     Mouth/Throat:     Pharynx: Oropharynx is clear.  Eyes:     Extraocular Movements: Extraocular movements intact.     Pupils: Pupils are equal, round, and reactive to light.  Neck:     Thyroid: No thyroid mass, thyromegaly or thyroid tenderness.     Vascular: No carotid bruit.  Cardiovascular:     Rate and Rhythm: Normal rate and regular rhythm. No extrasystoles are present.    Pulses:          Dorsalis pedis pulses are 1+ on the right side and 1+ on the left side.     Heart sounds: Normal heart sounds. No murmur heard.    No friction rub. No gallop.  Pulmonary:     Effort: Pulmonary effort is normal.     Breath sounds: Normal breath sounds. No decreased breath sounds, wheezing, rhonchi or rales.  Chest:     Chest wall: No mass.  Abdominal:      Palpations: Abdomen is soft. There is no hepatomegaly, splenomegaly or mass.     Tenderness: There is no abdominal tenderness.     Hernia: No hernia is present.  Musculoskeletal:     Cervical back: Normal range of motion.     Right lower leg: No edema.     Left lower leg: No edema.     Comments: Right and left second toe hammertoes. Arthritic changes in bilateral MCP joints. Knees, wrists, elbows not hot, red, tender.  Lymphadenopathy:     Cervical: No cervical adenopathy.     Upper Body:     Right upper body: No supraclavicular adenopathy.     Left upper body: No supraclavicular adenopathy.  Skin:    General: Skin is warm and dry.  Neurological:     General: No focal deficit present.     Mental Status: She is alert and oriented to person, place, and time. Mental status is at baseline.     Sensory: Sensation is intact.     Motor: Motor function is intact. No weakness.     Deep Tendon Reflexes: Reflexes are normal and symmetric.  Psychiatric:        Attention and Perception: Attention normal.        Mood and Affect: Mood normal.        Speech: Speech normal.        Behavior: Behavior normal.        Thought Content: Thought content normal.        Cognition and Memory: Cognition normal.        Judgment: Judgment normal.     Most recent functional status assessment:    03/03/2023   10:00 AM  In your present state of health, do you have any difficulty performing the following activities:  Hearing? 0  Vision? 0  Difficulty concentrating or making decisions? 0  Walking or climbing stairs? 0  Dressing or bathing? 0  Doing errands, shopping? 0  Preparing Food and eating ? N  Using the Toilet? N  In the past six months, have you accidently leaked urine? N  Do you have problems with loss of bowel control? N  Managing your Medications? N  Managing your Finances? N  Housekeeping or managing your Housekeeping? N  Most recent fall risk assessment:    03/03/2023   10:02 AM   Fall Risk   Falls in the past year? 0  Number falls in past yr: 0  Injury with Fall? 0  Risk for fall due to : No Fall Risks  Follow up Falls evaluation completed    Most recent depression screenings:    08/28/2022    9:32 AM 02/17/2022   11:03 AM  PHQ 2/9 Scores  PHQ - 2 Score 0 0   Most recent cognitive screening:    02/17/2022   11:04 AM  6CIT Screen  What Year? 0 points  What month? 0 points  What time? 0 points  Count back from 20 4 points  Months in reverse 4 points  Repeat phrase 10 points  Total Score 18 points     Results:   Studies obtained and personally reviewed by me:  Had colonoscopy by Dr. Ewing Schlein in 2014 showing chronic diverticulosis.   Mammogram last completed 01/15/23. No mammographic evidence of malignancy. Repeat recommended in 2025.  Labs:       Component Value Date/Time   NA 136 02/27/2023 0918   NA 143 07/15/2022 0000   K 5.7 (H) 02/27/2023 0918   CL 102 02/27/2023 0918   CO2 26 02/27/2023 0918   GLUCOSE 84 02/27/2023 0918   BUN 26 (H) 02/27/2023 0918   BUN 22 (A) 07/15/2022 0000   CREATININE 0.83 02/27/2023 0918   CALCIUM 9.9 02/27/2023 0918   PROT 7.0 02/27/2023 0918   ALBUMIN 4.1 01/07/2023 0917   AST 28 02/27/2023 0918   AST 29 01/07/2023 0917   ALT 20 02/27/2023 0918   ALT 24 01/07/2023 0917   ALKPHOS 68 01/07/2023 0917   BILITOT 0.5 02/27/2023 0918   BILITOT 0.4 01/07/2023 0917   GFRNONAA >60 01/07/2023 0917   GFRNONAA 59 (L) 06/22/2020 0907   GFRAA 69 06/22/2020 0907     Lab Results  Component Value Date   WBC 4.3 02/27/2023   HGB 9.9 (L) 02/27/2023   HCT 29.5 (L) 02/27/2023   MCV 118.0 (H) 02/27/2023   PLT 941 (H) 02/27/2023    Lab Results  Component Value Date   CHOL 167 02/27/2023   HDL 61 02/27/2023   LDLCALC 92 02/27/2023   TRIG 53 02/27/2023   CHOLHDL 2.7 02/27/2023    Lab Results  Component Value Date   HGBA1C 6.1 (H) 02/27/2023     Lab Results  Component Value Date   TSH 2.73 02/27/2023     Assessment & Plan:   Hyperlipidemia: treated with atorvastatin 20 mg daily. Lipid panel normal.   Essential thrombocytosis: seen by Dr. Candise Che and treated with Hydrea.    Normocytic anemia: followed by Dr. Candise Che. RBC low at 2.5 million, hemoglobin low at 9.9.   Impaired glucose tolerance: HGBA1c at 6.1% on 02/27/23, up from 5.8% on 08/21/22. Continue to monitor.  Seropositive rheumatoid arthritis: seen by rheumatologist, Dr. Kathi Ludwig.  Left neck pain: musculoskeletal   History of stage IIIa chronic kidney disease-stable for a number of years   History of irritable bowel syndrome   History of left reverse total shoulder arthroplasty 2015   Vaginal hysterectomy 2016   History of glaucoma  Urinalysis normal today.  Had colonoscopy by Dr. Ewing Schlein in 2014 showing chronic diverticulosis.    Mammogram last completed 01/15/23. No mammographic evidence of malignancy. Repeat recommended in 2025.  Vaccine counseling: UTD on tetanus, shingles vaccines. She will go to the pharmacy for high-dose flu,  Covid-19 boosters with time in-between.  Return in 6 months for follow-up or as needed.     Annual wellness visit done today including the all of the following: Reviewed patient's Family Medical History Reviewed and updated list of patient's medical providers Assessment of cognitive impairment was done Assessed patient's functional ability Established a written schedule for health screening services Health Risk Assessent Completed and Reviewed  Discussed health benefits of physical activity, and encouraged her to engage in regular exercise appropriate for her age and condition.        I,Alexander Ruley,acting as a Neurosurgeon for Margaree Mackintosh, MD.,have documented all relevant documentation on the behalf of Margaree Mackintosh, MD,as directed by  Margaree Mackintosh, MD while in the presence of Margaree Mackintosh, MD.   I, Margaree Mackintosh, MD, have reviewed all documentation for this visit. The documentation on  03/16/23 for the exam, diagnosis, procedures, and orders are all accurate and complete.

## 2023-02-26 DIAGNOSIS — H402222 Chronic angle-closure glaucoma, left eye, moderate stage: Secondary | ICD-10-CM | POA: Diagnosis not present

## 2023-02-27 ENCOUNTER — Other Ambulatory Visit: Payer: Medicare Other

## 2023-02-27 DIAGNOSIS — Z0000 Encounter for general adult medical examination without abnormal findings: Secondary | ICD-10-CM

## 2023-02-27 DIAGNOSIS — N1831 Chronic kidney disease, stage 3a: Secondary | ICD-10-CM | POA: Diagnosis not present

## 2023-02-27 DIAGNOSIS — E7800 Pure hypercholesterolemia, unspecified: Secondary | ICD-10-CM | POA: Diagnosis not present

## 2023-02-27 DIAGNOSIS — E611 Iron deficiency: Secondary | ICD-10-CM | POA: Diagnosis not present

## 2023-02-27 DIAGNOSIS — R718 Other abnormality of red blood cells: Secondary | ICD-10-CM | POA: Diagnosis not present

## 2023-02-27 DIAGNOSIS — R7302 Impaired glucose tolerance (oral): Secondary | ICD-10-CM

## 2023-02-27 NOTE — Progress Notes (Signed)
Lab only 

## 2023-03-02 ENCOUNTER — Other Ambulatory Visit: Payer: Self-pay

## 2023-03-02 DIAGNOSIS — E611 Iron deficiency: Secondary | ICD-10-CM

## 2023-03-02 DIAGNOSIS — R718 Other abnormality of red blood cells: Secondary | ICD-10-CM

## 2023-03-03 ENCOUNTER — Encounter: Payer: Self-pay | Admitting: Internal Medicine

## 2023-03-03 ENCOUNTER — Ambulatory Visit: Payer: Medicare Other | Admitting: Internal Medicine

## 2023-03-03 VITALS — BP 136/80 | HR 78 | Ht 59.75 in | Wt 139.0 lb

## 2023-03-03 DIAGNOSIS — Z862 Personal history of diseases of the blood and blood-forming organs and certain disorders involving the immune mechanism: Secondary | ICD-10-CM

## 2023-03-03 DIAGNOSIS — N1831 Chronic kidney disease, stage 3a: Secondary | ICD-10-CM | POA: Diagnosis not present

## 2023-03-03 DIAGNOSIS — K2100 Gastro-esophageal reflux disease with esophagitis, without bleeding: Secondary | ICD-10-CM

## 2023-03-03 DIAGNOSIS — M059 Rheumatoid arthritis with rheumatoid factor, unspecified: Secondary | ICD-10-CM | POA: Diagnosis not present

## 2023-03-03 DIAGNOSIS — E7800 Pure hypercholesterolemia, unspecified: Secondary | ICD-10-CM

## 2023-03-03 DIAGNOSIS — M19011 Primary osteoarthritis, right shoulder: Secondary | ICD-10-CM

## 2023-03-03 DIAGNOSIS — Z8659 Personal history of other mental and behavioral disorders: Secondary | ICD-10-CM

## 2023-03-03 DIAGNOSIS — D473 Essential (hemorrhagic) thrombocythemia: Secondary | ICD-10-CM

## 2023-03-03 DIAGNOSIS — I10 Essential (primary) hypertension: Secondary | ICD-10-CM

## 2023-03-03 DIAGNOSIS — Z0000 Encounter for general adult medical examination without abnormal findings: Secondary | ICD-10-CM

## 2023-03-03 DIAGNOSIS — Z96612 Presence of left artificial shoulder joint: Secondary | ICD-10-CM

## 2023-03-03 DIAGNOSIS — R7302 Impaired glucose tolerance (oral): Secondary | ICD-10-CM

## 2023-03-03 DIAGNOSIS — D539 Nutritional anemia, unspecified: Secondary | ICD-10-CM | POA: Diagnosis not present

## 2023-03-03 DIAGNOSIS — Z8719 Personal history of other diseases of the digestive system: Secondary | ICD-10-CM

## 2023-03-03 DIAGNOSIS — Z8669 Personal history of other diseases of the nervous system and sense organs: Secondary | ICD-10-CM

## 2023-03-03 LAB — POCT URINALYSIS DIP (CLINITEK)
Bilirubin, UA: NEGATIVE
Blood, UA: NEGATIVE
Glucose, UA: NEGATIVE mg/dL
Ketones, POC UA: NEGATIVE mg/dL
Leukocytes, UA: NEGATIVE
Nitrite, UA: NEGATIVE
POC PROTEIN,UA: NEGATIVE
Spec Grav, UA: 1.01 (ref 1.010–1.025)
Urobilinogen, UA: 0.2 U/dL
pH, UA: 6.5 (ref 5.0–8.0)

## 2023-03-04 LAB — COMPLETE METABOLIC PANEL WITH GFR
AG Ratio: 1.6 (calc) (ref 1.0–2.5)
ALT: 20 U/L (ref 6–29)
AST: 28 U/L (ref 10–35)
Albumin: 4.3 g/dL (ref 3.6–5.1)
Alkaline phosphatase (APISO): 75 U/L (ref 37–153)
BUN/Creatinine Ratio: 31 (calc) — ABNORMAL HIGH (ref 6–22)
BUN: 26 mg/dL — ABNORMAL HIGH (ref 7–25)
CO2: 26 mmol/L (ref 20–32)
Calcium: 9.9 mg/dL (ref 8.6–10.4)
Chloride: 102 mmol/L (ref 98–110)
Creat: 0.83 mg/dL (ref 0.60–0.95)
Globulin: 2.7 g/dL (ref 1.9–3.7)
Glucose, Bld: 84 mg/dL (ref 65–99)
Potassium: 5.7 mmol/L — ABNORMAL HIGH (ref 3.5–5.3)
Sodium: 136 mmol/L (ref 135–146)
Total Bilirubin: 0.5 mg/dL (ref 0.2–1.2)
Total Protein: 7 g/dL (ref 6.1–8.1)
eGFR: 69 mL/min/{1.73_m2} (ref >=60)

## 2023-03-04 LAB — CBC WITH DIFFERENTIAL/PLATELET
Absolute Monocytes: 649 {cells}/uL (ref 200–950)
Basophils Absolute: 9 {cells}/uL (ref 0–200)
Basophils Relative: 0.2 %
Eosinophils Absolute: 39 {cells}/uL (ref 15–500)
Eosinophils Relative: 0.9 %
HCT: 29.5 % — ABNORMAL LOW (ref 35.0–45.0)
Hemoglobin: 9.9 g/dL — ABNORMAL LOW (ref 11.7–15.5)
Lymphs Abs: 1221 {cells}/uL (ref 850–3900)
MCH: 39.6 pg — ABNORMAL HIGH (ref 27.0–33.0)
MCHC: 33.6 g/dL (ref 32.0–36.0)
MCV: 118 fL — ABNORMAL HIGH (ref 80.0–100.0)
MPV: 9.5 fL (ref 7.5–12.5)
Monocytes Relative: 15.1 %
Neutro Abs: 2382 {cells}/uL (ref 1500–7800)
Neutrophils Relative %: 55.4 %
Platelets: 941 10*3/uL — ABNORMAL HIGH (ref 140–400)
RBC: 2.5 10*6/uL — ABNORMAL LOW (ref 3.80–5.10)
RDW: 15 % (ref 11.0–15.0)
Total Lymphocyte: 28.4 %
WBC: 4.3 10*3/uL (ref 3.8–10.8)

## 2023-03-04 LAB — LIPID PANEL
Cholesterol: 167 mg/dL (ref <200)
HDL: 61 mg/dL (ref >=50)
LDL Cholesterol (Calc): 92 mg/dL
Non-HDL Cholesterol (Calc): 106 mg/dL (ref <130)
Total CHOL/HDL Ratio: 2.7 (calc) (ref <5.0)
Triglycerides: 53 mg/dL (ref <150)

## 2023-03-04 LAB — VITAMIN B12: Vitamin B-12: >2000 pg/mL — ABNORMAL HIGH (ref 200–1100)

## 2023-03-04 LAB — TEST AUTHORIZATION

## 2023-03-04 LAB — IRON,TIBC AND FERRITIN PANEL
%SAT: 32 % (ref 16–45)
Ferritin: 511 ng/mL — ABNORMAL HIGH (ref 16–288)
Iron: 106 ug/dL (ref 45–160)
TIBC: 333 ug/dL (ref 250–450)

## 2023-03-04 LAB — HEMOGLOBIN A1C
Hgb A1c MFr Bld: 6.1 %{Hb} — ABNORMAL HIGH (ref <5.7)
Mean Plasma Glucose: 128 mg/dL
eAG (mmol/L): 7.1 mmol/L

## 2023-03-04 LAB — TSH: TSH: 2.73 m[IU]/L (ref 0.40–4.50)

## 2023-03-04 LAB — CBC MORPHOLOGY

## 2023-03-04 LAB — FOLATE: Folate: >24 ng/mL

## 2023-03-16 ENCOUNTER — Encounter: Payer: Self-pay | Admitting: Internal Medicine

## 2023-03-16 NOTE — Patient Instructions (Addendum)
It was a pleasure to see you today. Labs are stable. Return in one year or as needed. No change in medications.

## 2023-03-26 DIAGNOSIS — H402222 Chronic angle-closure glaucoma, left eye, moderate stage: Secondary | ICD-10-CM | POA: Diagnosis not present

## 2023-04-02 ENCOUNTER — Other Ambulatory Visit: Payer: Self-pay | Admitting: *Deleted

## 2023-04-02 DIAGNOSIS — D473 Essential (hemorrhagic) thrombocythemia: Secondary | ICD-10-CM

## 2023-04-02 NOTE — Progress Notes (Signed)
HEMATOLOGY/ONCOLOGY CLINIC VISIT NOTE  Date of Service: 04/03/2023    Patient Care Team: Margaree Mackintosh, MD as PCP - Ricky Stabs, MD as Consulting Physician (Gastroenterology) Rossie Muskrat, MD (Rheumatology)  CHIEF COMPLAINTS/PURPOSE OF CONSULTATION:  Follow-up for continued evaluation and management of Essential thrombocytosis Caleticulin positive  HISTORY OF PRESENTING ILLNESS:  Previous notes for details on initial presentation  INTERVAL HISTORY:  [name] is a 82 y.o. female who is here for continued evaluation and management of her Calreticulin positive essential thrombocytosis. Patient was last seen by me on 01/07/2023 and was doing well overall with no acute new symptoms.   Today, she reports that she was put on 3 tablets of Prednisone recently due to an RA flare in her knee. She reports that she was not recently on a low dose Prednisone prior.   She reports that she typically has RA flares often, mainly in her knee and ankles. Patient has no RA symptoms in her hands. She does have carpal tunnel in her hands. Her rheumatologist is Dr. Kathi Ludwig.   She reports that she does not have gout flares frequently.   She continues to take hydroxyurea as prescribed and denies any new or severe toxicities.   She denies any other major medications other than her recent steroids.  She complains that her kness and ankles swell. Her left leg swelling is worse than the right. Her leg swelling appears okay during the time of visit.  Patient reports that steroids did improve her neck pain to some degree, but it continues to be painful. She reports that her rheumatologist thought her neck pain was related to arthritis.  Her BP in clinic today is 160/57.  Patient denies any diarrhea, stomach issues, or headaches.   She regularly takes aspirin. Patient reports that she is UTD with her age-appropriate vaccines.   MEDICAL HISTORY:  Past Medical History:  Diagnosis Date    Arthritis    Cervical stenosis of spine    Dysphasia    Essential hypertension, benign    GERD (gastroesophageal reflux disease)    Glaucoma    Heart murmur    History of estrogen therapy    Hypercholesteremia    IBS (irritable bowel syndrome)    Mitral regurgitation    Pulmonary air embolism (HCC)    states she had this wiht her back surgery   Rheumatoid arthritis (HCC)    Seborrhea    patient denies    SURGICAL HISTORY: Past Surgical History:  Procedure Laterality Date   ANTERIOR AND POSTERIOR REPAIR WITH SACROSPINOUS FIXATION N/A 01/08/2015   Procedure: ANTERIOR AND POSTERIOR REPAIR WITH possible SACROSPINOUS FIXATION;  Surgeon: Richarda Overlie, MD;  Location: [facility];  Service: Gynecology;  Laterality: N/A;   BACK SURGERY     CATARACT EXTRACTION W/ INTRAOCULAR LENS  IMPLANT, BILATERAL Bilateral    COLONOSCOPY     EYE SURGERY     LAPAROSCOPIC ASSISTED VAGINAL HYSTERECTOMY N/A 01/08/2015   Procedure: LAPAROSCOPIC ASSISTED VAGINAL HYSTERECTOMY;  Surgeon: Richarda Overlie, MD;  Location: [facility];  Service: Gynecology;  Laterality: N/A;   LUMBAR FUSION  05/2010   REVERSE SHOULDER ARTHROPLASTY Left 02/16/2014   Procedure: LEFT REVERSE TOTAL SHOULDER ARTHROPLASTY;  Surgeon: Senaida Lange, MD;  Location: [facility];  Service: Orthopedics;  Laterality: Left;   REVERSE SHOULDER ARTHROPLASTY Right 08/07/2016   REVERSE SHOULDER ARTHROPLASTY Right 08/07/2016   Procedure: REVERSE RIGHT SHOULDER ARTHROPLASTY;  Surgeon: Francena Hanly, MD;  Location: [facility];  Service: Orthopedics;  Laterality: Right;   TOE SURGERY     TUBAL LIGATION      SOCIAL HISTORY: Social History   Socioeconomic History   Marital status: Widowed    Spouse name: Not on file   Number of children: 9   Years of education: 7   Highest education level: Not on file  Occupational History   Not on file  Tobacco Use   Smoking status: Never   Smokeless tobacco: Never  Substance and Sexual Activity   Alcohol use: No   Drug use:  No   Sexual activity: Never  Other Topics Concern   Not on file  Social History Narrative   Lives with daughter   Caffeine use: none      Social history: She is retired.  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  Does not smoke or consume alcohol.  She is a widow.       Family history: Mother died of an MI with history of hypertension who was 84 years of age at the time of her event.  1 brother with history of adult onset diabetes, 5 sisters-one of them has diabetes.  1 daughter died of an occult GI malignancy.  1 brother in good health.  She has 9 children.  1 daughter with history of pulmonary sarcoidosis years ago that resolved.   Social Determinants of Health   Financial Resource Strain: Low Risk  (03/03/2023)   Overall Financial Resource Strain (CARDIA)    Difficulty of Paying Living Expenses: Not hard at all  Food Insecurity: No Food Insecurity (03/03/2023)   Hunger Vital Sign    Worried About Running Out of Food in the Last Year: Never true    Ran Out of Food in the Last Year: Never true  Transportation Needs: No Transportation Needs (03/03/2023)   PRAPARE - Administrator, Civil Service (Medical): No    Lack of Transportation (Non-Medical): No  Physical Activity: Insufficiently Active (03/03/2023)   Exercise Vital Sign    Days of Exercise per Week: 7 days    Minutes of Exercise per Session: 10 min  Stress: No Stress Concern Present (03/03/2023)   [employer] of Occupational Health - Occupational Stress Questionnaire    Feeling of Stress : Not at all  Social Connections: Moderately Integrated (03/03/2023)   Social Connection and Isolation Panel [NHANES]    Frequency of Communication with Friends and Family: More than three times a week    Frequency of Social Gatherings with Friends and Family: More than three times a week    Attends Religious Services: More than 4 times per year    Active Member of  [employer] or Organizations: Yes    Attends Engineer, structural: More than 4 times per year    Marital Status: Divorced  Intimate Partner Violence: Not At Risk (03/03/2023)   Humiliation, Afraid, Rape, and Kick questionnaire    Fear of Current or Ex-Partner: No    Emotionally Abused: No    Physically Abused: No    Sexually Abused: No    FAMILY HISTORY: Family History  Problem Relation Age of Onset   Healthy Daughter     ALLERGIES:  is allergic to alendronate, allopurinol, amoxicillin-pot clavulanate, folic acid, methotrexate derivatives, latex, and tramadol.  MEDICATIONS:  Current Outpatient Medications  Medication Sig Dispense Refill   aspirin 81 MG tablet Take 81 mg by mouth daily.     atorvastatin (LIPITOR) 20  MG tablet Take 1 tablet (20 mg total) by mouth daily. 100 tablet 2   clotrimazole-betamethasone (LOTRISONE) cream Apply 1 application topically 2 (two) times daily. 45 g 2   hydroxyurea (HYDREA) 500 MG capsule TAKE 3 CAPSULES BY MOUTH MONDAYS TUESDAY THURSDAY AND FRIDAYS AND 2 CAPSULES BY MOUTH ON WEDNESDAY SATURDAY SUNDAYS TAKE WITH FOOD 90 capsule 2   hydroxyurea (HYDREA) 500 MG capsule Take 1 capsule (500 mg total) by mouth daily. TAKE 3 CAPSULES BY MOUTH MONDAYS TUESDAY THURSDAY AND FRIDAYS AND 2 CAPSULES BY MOUTH ON WEDNESDAY SATURDAY SUNDAYS TAKE WITH FOOD, 15 capsule 0   lansoprazole (PREVACID) 30 MG capsule Take 1 capsule (30 mg total) by mouth daily at 12 noon. 30 capsule    mupirocin ointment (BACTROBAN) 2 % APPLY TOPICALLY TO THE AFFECTED AREA TWICE DAILY AS NEEDED 22 g 11   NEOMYCIN-POLYMYXIN-HYDROCORTISONE (CORTISPORIN) 1 % SOLN OTIC solution Place 3 drops into the left ear 4 (four) times daily. 10 mL 0   polyethylene glycol powder (GLYCOLAX/MIRALAX) powder MIX 1 CAPFUL IN 8 OUNCES OF WATER AND DRINK ONCE DAILY AS DIRECTED 527 g 0   No current facility-administered medications for this visit.    REVIEW OF SYSTEMS:    10 Point review of Systems was done  is negative except as noted above.   PHYSICAL EXAMINATION:  .BP (!) 160/57 (BP Location: Right Arm)   Pulse 72   Temp 97.7 F (36.5 C) (Oral)   Resp 17   Ht 4' 11.75" (1.518 m)   Wt 140 lb 8 oz (63.7 kg)   SpO2 100%   BMI 27.67 kg/m   GENERAL:alert, in no acute distress and comfortable SKIN: no acute rashes, no significant lesions EYES: conjunctiva are pink and non-injected, sclera anicteric OROPHARYNX: MMM, no exudates, no oropharyngeal erythema or ulceration NECK: supple, no JVD LYMPH:  no palpable lymphadenopathy in the cervical, axillary or inguinal regions LUNGS: clear to auscultation b/l with normal respiratory effort HEART: regular rate & rhythm ABDOMEN:  normoactive bowel sounds , non tender, not distended. Extremity: no pedal edema PSYCH: alert & oriented x 3 with fluent speech NEURO: no focal motor/sensory deficits   LABORATORY DATA:  I have reviewed the data as listed .    Latest Ref Rng & Units 02/27/2023    9:18 AM 01/07/2023    9:17 AM 10/01/2022   12:24 PM  CBC  WBC 3.8 - 10.8 Thousand/uL 4.3  6.0  4.5   Hemoglobin 11.7 - 15.5 g/dL 9.9  44.0  10.2   Hematocrit 35.0 - 45.0 % 29.5  30.0  30.8   Platelets 140 - 400 Thousand/uL 941  1,048  1,020        Latest Ref Rng & Units 02/27/2023    9:18 AM 01/07/2023    9:17 AM 10/01/2022   12:24 PM  CMP  Glucose 65 - 99 mg/dL 84  73  93   BUN 7 - 25 mg/dL 26  26  19    Creatinine 0.60 - 0.95 mg/dL 7.25  3.66  4.40   Sodium 135 - 146 mmol/L 136  139  140   Potassium 3.5 - 5.3 mmol/L 5.7  3.9  3.7   Chloride 98 - 110 mmol/L 102  106  107   CO2 20 - 32 mmol/L 26  29  27    Calcium 8.6 - 10.4 mg/dL 9.9  9.2  9.1   Total Protein 6.1 - 8.1 g/dL 7.0  7.0  7.0   Total Bilirubin 0.2 -  1.2 mg/dL 0.5  0.4  0.4   Alkaline Phos 38 - 126 U/L  68  71   AST 10 - 35 U/L 28  29  22    ALT 6 - 29 U/L 20  24  13     . Lab Results  Component Value Date   IRON 106 02/27/2023   TIBC 333 02/27/2023   IRONPCTSAT 32 02/27/2023    (Iron and TIBC)  Lab Results  Component Value Date   FERRITIN 511 (H) 02/27/2023       RADIOGRAPHIC STUDIES: I have personally reviewed the radiological images as listed and agreed with the findings in the report. No results found.  ASSESSMENT & PLAN:   82 y.o. 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 CBC showed WBC of 5.8K, hemoglobin of 9.8, and platelets of 1137K. -her platelets do tend to run at higher levels -discussed that with her calreticulin mutation, there may be a slightly less risk of blood clots -discussed that given her calreticulin mutation, current decent dose of hydroxyurea, and some anemia, there is not much flexibility to adjust hydroxyurea as we would want to balance hgb and platelets.  -will refill hydroxyurea  -No notable toxicities from current dose of hydroxyurea -continue current dose of Hydroxyurea 1500 mg on 4 days a week and 1000 mg the rest of the days of the week  -discussed option of using splints to use while sleeping at night to improve carpal tunnel -discussed that RA can increase the risk of carpual tunnel in the hands -continue aspirin -Patient is reportedly UTD with her age-appropriate vaccines -Continue to optimize rheumatoid arthritis treatment with rheumatologist   FOLLOW-UP: RTC with Dr Candise Che with labs in 3 months  The total time spent in the appointment was 21 minutes* .  All of the patient's questions were answered with apparent satisfaction. The patient knows to call the clinic with any problems, questions or concerns.   Wyvonnia Lora MD MS AAHIVMS [facility] Hematology/Oncology Physician [facility]  .*Total Encounter Time as defined by the Centers for Medicare and Medicaid  Services includes, in addition to the face-to-face time of a patient visit (documented in the note above) non-face-to-face time: obtaining and reviewing outside history, ordering and reviewing medications, tests or procedures, care coordination (communications with other health care professionals or caregivers) and documentation in the medical record.    I,Mitra Faeizi,acting as a Neurosurgeon for Wyvonnia Lora, MD.,have documented all relevant documentation on the behalf of Wyvonnia Lora, MD,as directed by  Wyvonnia Lora, MD while in the presence of Wyvonnia Lora, MD.  .I have reviewed the above documentation for accuracy and completeness, and I agree with the above. Johney Maine MD

## 2023-04-03 ENCOUNTER — Inpatient Hospital Stay: Payer: Medicare Other | Admitting: Hematology

## 2023-04-03 ENCOUNTER — Inpatient Hospital Stay: Payer: Medicare Other | Attending: Hematology

## 2023-04-03 DIAGNOSIS — Z7982 Long term (current) use of aspirin: Secondary | ICD-10-CM | POA: Insufficient documentation

## 2023-04-03 DIAGNOSIS — M069 Rheumatoid arthritis, unspecified: Secondary | ICD-10-CM | POA: Diagnosis not present

## 2023-04-03 DIAGNOSIS — D75839 Thrombocytosis, unspecified: Secondary | ICD-10-CM | POA: Diagnosis not present

## 2023-04-03 DIAGNOSIS — D473 Essential (hemorrhagic) thrombocythemia: Secondary | ICD-10-CM

## 2023-04-03 DIAGNOSIS — D649 Anemia, unspecified: Secondary | ICD-10-CM | POA: Insufficient documentation

## 2023-04-03 DIAGNOSIS — Z79899 Other long term (current) drug therapy: Secondary | ICD-10-CM | POA: Diagnosis not present

## 2023-04-03 LAB — CBC WITH DIFFERENTIAL (CANCER CENTER ONLY)
Abs Immature Granulocytes: 0.02 10*3/uL (ref 0.00–0.07)
Basophils Absolute: 0 10*3/uL (ref 0.0–0.1)
Basophils Relative: 0 %
Eosinophils Absolute: 0 10*3/uL (ref 0.0–0.5)
Eosinophils Relative: 1 %
HCT: 29.2 % — ABNORMAL LOW (ref 36.0–46.0)
Hemoglobin: 9.8 g/dL — ABNORMAL LOW (ref 12.0–15.0)
Immature Granulocytes: 0 %
Lymphocytes Relative: 27 %
Lymphs Abs: 1.5 10*3/uL (ref 0.7–4.0)
MCH: 41.2 pg — ABNORMAL HIGH (ref 26.0–34.0)
MCHC: 33.6 g/dL (ref 30.0–36.0)
MCV: 122.7 fL — ABNORMAL HIGH (ref 80.0–100.0)
Monocytes Absolute: 0.7 10*3/uL (ref 0.1–1.0)
Monocytes Relative: 12 %
Neutro Abs: 3.5 10*3/uL (ref 1.7–7.7)
Neutrophils Relative %: 60 %
Platelet Count: 1137 10*3/uL (ref 150–400)
RBC: 2.38 MIL/uL — ABNORMAL LOW (ref 3.87–5.11)
RDW: 16 % — ABNORMAL HIGH (ref 11.5–15.5)
WBC Count: 5.8 10*3/uL (ref 4.0–10.5)
nRBC: 0.5 % — ABNORMAL HIGH (ref 0.0–0.2)

## 2023-04-03 LAB — CMP (CANCER CENTER ONLY)
ALT: 28 U/L (ref 0–44)
AST: 27 U/L (ref 15–41)
Albumin: 4 g/dL (ref 3.5–5.0)
Alkaline Phosphatase: 58 U/L (ref 38–126)
Anion gap: 5 (ref 5–15)
BUN: 30 mg/dL — ABNORMAL HIGH (ref 8–23)
CO2: 26 mmol/L (ref 22–32)
Calcium: 8.9 mg/dL (ref 8.9–10.3)
Chloride: 106 mmol/L (ref 98–111)
Creatinine: 0.96 mg/dL (ref 0.44–1.00)
GFR, Estimated: 58 mL/min — ABNORMAL LOW (ref >60)
Glucose, Bld: 70 mg/dL (ref 70–99)
Potassium: 3.9 mmol/L (ref 3.5–5.1)
Sodium: 137 mmol/L (ref 135–145)
Total Bilirubin: 0.4 mg/dL (ref <1.2)
Total Protein: 6.8 g/dL (ref 6.5–8.1)

## 2023-04-03 LAB — IRON AND IRON BINDING CAPACITY (CC-WL,HP ONLY)
Iron: 95 ug/dL (ref 28–170)
Saturation Ratios: 28 % (ref 10.4–31.8)
TIBC: 344 ug/dL (ref 250–450)
UIBC: 249 ug/dL (ref 148–442)

## 2023-04-03 LAB — RETICULOCYTES
Immature Retic Fract: 26.6 % — ABNORMAL HIGH (ref 2.3–15.9)
RBC.: 2.33 MIL/uL — ABNORMAL LOW (ref 3.87–5.11)
Retic Count, Absolute: 48.5 10*3/uL (ref 19.0–186.0)
Retic Ct Pct: 2.1 % (ref 0.4–3.1)

## 2023-04-03 LAB — FERRITIN: Ferritin: 399 ng/mL — ABNORMAL HIGH (ref 11–307)

## 2023-04-03 MED ORDER — HYDROXYUREA 500 MG PO CAPS: ORAL_CAPSULE | ORAL | 4 refills | Status: DC

## 2023-04-09 ENCOUNTER — Encounter: Payer: Self-pay | Admitting: Hematology

## 2023-05-28 ENCOUNTER — Other Ambulatory Visit: Payer: Self-pay | Admitting: Internal Medicine

## 2023-06-03 DIAGNOSIS — H402222 Chronic angle-closure glaucoma, left eye, moderate stage: Secondary | ICD-10-CM | POA: Diagnosis not present

## 2023-06-03 DIAGNOSIS — H26493 Other secondary cataract, bilateral: Secondary | ICD-10-CM | POA: Diagnosis not present

## 2023-06-03 DIAGNOSIS — H402211 Chronic angle-closure glaucoma, right eye, mild stage: Secondary | ICD-10-CM | POA: Diagnosis not present

## 2023-06-03 DIAGNOSIS — H35363 Drusen (degenerative) of macula, bilateral: Secondary | ICD-10-CM | POA: Diagnosis not present

## 2023-06-03 DIAGNOSIS — H524 Presbyopia: Secondary | ICD-10-CM | POA: Diagnosis not present

## 2023-06-03 LAB — HM DIABETES EYE EXAM

## 2023-07-09 ENCOUNTER — Other Ambulatory Visit: Payer: Self-pay | Admitting: *Deleted

## 2023-07-09 DIAGNOSIS — D473 Essential (hemorrhagic) thrombocythemia: Secondary | ICD-10-CM

## 2023-07-10 ENCOUNTER — Encounter: Payer: Self-pay | Admitting: Hematology

## 2023-07-10 ENCOUNTER — Inpatient Hospital Stay: Payer: Medicare Other | Attending: Hematology

## 2023-07-10 ENCOUNTER — Inpatient Hospital Stay: Payer: Medicare Other | Admitting: Hematology

## 2023-07-10 VITALS — BP 160/68 | HR 63 | Temp 97.6°F | Resp 16 | Wt 141.6 lb

## 2023-07-10 DIAGNOSIS — D473 Essential (hemorrhagic) thrombocythemia: Secondary | ICD-10-CM

## 2023-07-10 DIAGNOSIS — D649 Anemia, unspecified: Secondary | ICD-10-CM | POA: Diagnosis not present

## 2023-07-10 DIAGNOSIS — Z7982 Long term (current) use of aspirin: Secondary | ICD-10-CM | POA: Insufficient documentation

## 2023-07-10 DIAGNOSIS — Z79899 Other long term (current) drug therapy: Secondary | ICD-10-CM | POA: Diagnosis not present

## 2023-07-10 LAB — CMP (CANCER CENTER ONLY)
ALT: 24 U/L (ref 0–44)
AST: 29 U/L (ref 15–41)
Albumin: 4.1 g/dL (ref 3.5–5.0)
Alkaline Phosphatase: 64 U/L (ref 38–126)
Anion gap: 6 (ref 5–15)
BUN: 29 mg/dL — ABNORMAL HIGH (ref 8–23)
CO2: 26 mmol/L (ref 22–32)
Calcium: 9.3 mg/dL (ref 8.9–10.3)
Chloride: 104 mmol/L (ref 98–111)
Creatinine: 0.96 mg/dL (ref 0.44–1.00)
GFR, Estimated: 58 mL/min — ABNORMAL LOW (ref >60)
Glucose, Bld: 74 mg/dL (ref 70–99)
Potassium: 4.4 mmol/L (ref 3.5–5.1)
Sodium: 136 mmol/L (ref 135–145)
Total Bilirubin: 0.3 mg/dL (ref 0.0–1.2)
Total Protein: 7 g/dL (ref 6.5–8.1)

## 2023-07-10 LAB — FERRITIN: Ferritin: 374 ng/mL — ABNORMAL HIGH (ref 11–307)

## 2023-07-10 LAB — CBC WITH DIFFERENTIAL (CANCER CENTER ONLY)
Abs Immature Granulocytes: 0.06 10*3/uL (ref 0.00–0.07)
Basophils Absolute: 0 10*3/uL (ref 0.0–0.1)
Basophils Relative: 0 %
Eosinophils Absolute: 0.1 10*3/uL (ref 0.0–0.5)
Eosinophils Relative: 1 %
HCT: 28.7 % — ABNORMAL LOW (ref 36.0–46.0)
Hemoglobin: 9.5 g/dL — ABNORMAL LOW (ref 12.0–15.0)
Immature Granulocytes: 1 %
Lymphocytes Relative: 28 %
Lymphs Abs: 1.3 10*3/uL (ref 0.7–4.0)
MCH: 40.8 pg — ABNORMAL HIGH (ref 26.0–34.0)
MCHC: 33.1 g/dL (ref 30.0–36.0)
MCV: 123.2 fL — ABNORMAL HIGH (ref 80.0–100.0)
Monocytes Absolute: 0.8 10*3/uL (ref 0.1–1.0)
Monocytes Relative: 18 %
Neutro Abs: 2.4 10*3/uL (ref 1.7–7.7)
Neutrophils Relative %: 52 %
Platelet Count: 1063 10*3/uL (ref 150–400)
RBC: 2.33 MIL/uL — ABNORMAL LOW (ref 3.87–5.11)
RDW: 14.6 % (ref 11.5–15.5)
WBC Count: 4.7 10*3/uL (ref 4.0–10.5)
nRBC: 1.1 % — ABNORMAL HIGH (ref 0.0–0.2)

## 2023-07-10 LAB — IRON AND IRON BINDING CAPACITY (CC-WL,HP ONLY)
Iron: 92 ug/dL (ref 28–170)
Saturation Ratios: 26 % (ref 10.4–31.8)
TIBC: 361 ug/dL (ref 250–450)
UIBC: 269 ug/dL (ref 148–442)

## 2023-07-10 LAB — RETICULOCYTES
Immature Retic Fract: 29.8 % — ABNORMAL HIGH (ref 2.3–15.9)
RBC.: 2.29 MIL/uL — ABNORMAL LOW (ref 3.87–5.11)
Retic Count, Absolute: 42.1 10*3/uL (ref 19.0–186.0)
Retic Ct Pct: 1.8 % (ref 0.4–3.1)

## 2023-07-10 MED ORDER — HYDROXYUREA 500 MG PO CAPS: ORAL_CAPSULE | ORAL | 4 refills | Status: DC

## 2023-07-10 NOTE — Progress Notes (Signed)
 HEMATOLOGY/ONCOLOGY CLINIC VISIT NOTE  Date of Service: 07/10/23    Patient Care Team: Margaree Mackintosh, MD as PCP - Ricky Stabs, MD as Consulting Physician (Gastroenterology) Rossie Muskrat, MD (Rheumatology)  CHIEF COMPLAINTS/PURPOSE OF CONSULTATION:  Follow-up for continued evaluation and management of Essential thrombocytosis Caleticulin positive  HISTORY OF PRESENTING ILLNESS:  Previous notes for details on initial presentation  INTERVAL HISTORY:  [name] is a 83 y.o. female who is here for continued evaluation and management of her Calreticulin positive essential thrombocytosis. Patient was last seen by me on 04/03/2023 and reported frequent RA flares in her knee and ankles, most recently in her knee. She also reported carpal tunnel in her hands, frequent gout flares, bilateral lower extremity edema L>R, and neck pain.  Today, she complains of bothersome carpul tunnel sydrome worse at night. Patient reports that there have been considerations of surgical intervention. She notes that she does not sleep on her wrist.   Patient reports RA flares in areas including her neck and left knee sometimes.   She takes vitamin B12 daily and vitamin D3 regularly. Patient is not taking oral iron or vitamin B complex.   She continues to take hydroxyurea as prescribed and denies any new or severe toxicities. Patient regularly takes three tablets four days a week, and two tablets on remaining three days of the week.   She continues to take baby aspirin and denies any concern for abnormal bleeding or bruising.   Patient denies any infection issues, abdominal pain, new leg swelling, new chest pain, or SOB. She complains of head pain/tightness. She is eating and drinking well.   MEDICAL HISTORY:  Past Medical History:  Diagnosis Date   Arthritis    Cervical stenosis of spine    Dysphasia    Essential hypertension, benign    GERD (gastroesophageal reflux disease)     Glaucoma    Heart murmur    History of estrogen therapy    Hypercholesteremia    IBS (irritable bowel syndrome)    Mitral regurgitation    Pulmonary air embolism (HCC)    states she had this wiht her back surgery   Rheumatoid arthritis (HCC)    Seborrhea    patient denies    SURGICAL HISTORY: Past Surgical History:  Procedure Laterality Date   ANTERIOR AND POSTERIOR REPAIR WITH SACROSPINOUS FIXATION N/A 01/08/2015   Procedure: ANTERIOR AND POSTERIOR REPAIR WITH possible SACROSPINOUS FIXATION;  Surgeon: Richarda Overlie, MD;  Location: [facility];  Service: Gynecology;  Laterality: N/A;   BACK SURGERY     CATARACT EXTRACTION W/ INTRAOCULAR LENS  IMPLANT, BILATERAL Bilateral    COLONOSCOPY     EYE SURGERY     LAPAROSCOPIC ASSISTED VAGINAL HYSTERECTOMY N/A 01/08/2015   Procedure: LAPAROSCOPIC ASSISTED VAGINAL HYSTERECTOMY;  Surgeon: Richarda Overlie, MD;  Location: [facility];  Service: Gynecology;  Laterality: N/A;   LUMBAR FUSION  05/2010   REVERSE SHOULDER ARTHROPLASTY Left 02/16/2014   Procedure: LEFT REVERSE TOTAL SHOULDER ARTHROPLASTY;  Surgeon: Senaida Lange, MD;  Location: [facility];  Service: Orthopedics;  Laterality: Left;   REVERSE SHOULDER ARTHROPLASTY Right 08/07/2016   REVERSE SHOULDER ARTHROPLASTY Right 08/07/2016   Procedure: REVERSE RIGHT SHOULDER ARTHROPLASTY;  Surgeon: Francena Hanly, MD;  Location: [facility];  Service: Orthopedics;  Laterality: Right;   TOE SURGERY     TUBAL LIGATION      SOCIAL HISTORY: Social History   Socioeconomic History   Marital status: Widowed    Spouse name:  Not on file   Number of children: 9   Years of education: 7   Highest education level: Not on file  Occupational History   Not on file  Tobacco Use   Smoking status: Never   Smokeless tobacco: Never  Substance and Sexual Activity   Alcohol use: No   Drug use: No   Sexual activity: Never  Other Topics Concern   Not on file  Social History Narrative   Lives with daughter   Caffeine use:  none      Social history: She is retired.  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  Does not smoke or consume alcohol.  She is a widow.       Family history: Mother died of an MI with history of hypertension who was 85 years of age at the time of her event.  1 brother with history of adult onset diabetes, 5 sisters-one of them has diabetes.  1 daughter died of an occult GI malignancy.  1 brother in good health.  She has 9 children.  1 daughter with history of pulmonary sarcoidosis years ago that resolved.   Social Drivers of Corporate investment banker Strain: Low Risk  (03/03/2023)   Overall Financial Resource Strain (CARDIA)    Difficulty of Paying Living Expenses: Not hard at all  Food Insecurity: No Food Insecurity (03/03/2023)   Hunger Vital Sign    Worried About Running Out of Food in the Last Year: Never true    Ran Out of Food in the Last Year: Never true  Transportation Needs: No Transportation Needs (03/03/2023)   PRAPARE - Administrator, Civil Service (Medical): No    Lack of Transportation (Non-Medical): No  Physical Activity: Insufficiently Active (03/03/2023)   Exercise Vital Sign    Days of Exercise per Week: 7 days    Minutes of Exercise per Session: 10 min  Stress: No Stress Concern Present (03/03/2023)   [employer] of Occupational Health - Occupational Stress Questionnaire    Feeling of Stress : Not at all  Social Connections: Moderately Integrated (03/03/2023)   Social Connection and Isolation Panel [NHANES]    Frequency of Communication with Friends and Family: More than three times a week    Frequency of Social Gatherings with Friends and Family: More than three times a week    Attends Religious Services: More than 4 times per year    Active Member of [employer] or Organizations: Yes    Attends Engineer, structural: More than 4 times per year    Marital Status: Divorced  Intimate  Partner Violence: Not At Risk (03/03/2023)   Humiliation, Afraid, Rape, and Kick questionnaire    Fear of Current or Ex-Partner: No    Emotionally Abused: No    Physically Abused: No    Sexually Abused: No    FAMILY HISTORY: Family History  Problem Relation Age of Onset   Healthy Daughter     ALLERGIES:  is allergic to alendronate, allopurinol, amoxicillin-pot clavulanate, folic acid, methotrexate derivatives, latex, and tramadol.  MEDICATIONS:  Current Outpatient Medications  Medication Sig Dispense Refill   aspirin 81 MG tablet Take 81 mg by mouth daily.     atorvastatin (LIPITOR) 20 MG tablet TAKE 1 TABLET BY MOUTH DAILY 100 tablet 2   clotrimazole-betamethasone (LOTRISONE) cream Apply 1 application topically 2 (two) times daily. 45 g 2   hydroxyurea (HYDREA) 500 MG capsule TAKE  3 CAPSULES BY MOUTH MONDAYS TUESDAY THURSDAY AND FRIDAYS AND 2 CAPSULES BY MOUTH ON WEDNESDAY SATURDAY SUNDAYS TAKE WITH FOOD 90 capsule 4   lansoprazole (PREVACID) 30 MG capsule Take 1 capsule (30 mg total) by mouth daily at 12 noon. 30 capsule    mupirocin ointment (BACTROBAN) 2 % APPLY TOPICALLY TO THE AFFECTED AREA TWICE DAILY AS NEEDED 22 g 11   NEOMYCIN-POLYMYXIN-HYDROCORTISONE (CORTISPORIN) 1 % SOLN OTIC solution Place 3 drops into the left ear 4 (four) times daily. 10 mL 0   polyethylene glycol powder (GLYCOLAX/MIRALAX) powder MIX 1 CAPFUL IN 8 OUNCES OF WATER AND DRINK ONCE DAILY AS DIRECTED 527 g 0   No current facility-administered medications for this visit.    REVIEW OF SYSTEMS:    10 Point review of Systems was done is negative except as noted above.   PHYSICAL EXAMINATION:  .BP (!) 160/68 (BP Location: Left Arm, Patient Position: Sitting)   Pulse 63   Temp 97.6 F (36.4 C) (Oral)   Resp 16   Wt 141 lb 9 oz (64.2 kg)   SpO2 100%   BMI 27.88 kg/m   GENERAL:alert, in no acute distress and comfortable SKIN: no acute rashes, no significant lesions EYES: conjunctiva are pink and  non-injected, sclera anicteric OROPHARYNX: MMM, no exudates, no oropharyngeal erythema or ulceration NECK: supple, no JVD LYMPH:  no palpable lymphadenopathy in the cervical, axillary or inguinal regions LUNGS: clear to auscultation b/l with normal respiratory effort HEART: regular rate & rhythm ABDOMEN:  normoactive bowel sounds , non tender, not distended. Extremity: no pedal edema PSYCH: alert & oriented x 3 with fluent speech NEURO: no focal motor/sensory deficits   LABORATORY DATA:  I have reviewed the data as listed .    Latest Ref Rng & Units 07/10/2023    8:57 AM 04/03/2023    9:29 AM 02/27/2023    9:18 AM  CBC  WBC 4.0 - 10.5 K/uL 4.7  5.8  4.3   Hemoglobin 12.0 - 15.0 g/dL 9.5  9.8  9.9   Hematocrit 36.0 - 46.0 % 28.7  29.2  29.5   Platelets 150 - 400 K/uL 1,063  1,137  941        Latest Ref Rng & Units 07/10/2023    8:57 AM 04/03/2023    9:29 AM 02/27/2023    9:18 AM  CMP  Glucose 70 - 99 mg/dL 74  70  84   BUN 8 - 23 mg/dL 29  30  26    Creatinine 0.44 - 1.00 mg/dL 9.14  7.82  9.56   Sodium 135 - 145 mmol/L 136  137  136   Potassium 3.5 - 5.1 mmol/L 4.4  3.9  5.7   Chloride 98 - 111 mmol/L 104  106  102   CO2 22 - 32 mmol/L 26  26  26    Calcium 8.9 - 10.3 mg/dL 9.3  8.9  9.9   Total Protein 6.5 - 8.1 g/dL 7.0  6.8  7.0   Total Bilirubin 0.0 - 1.2 mg/dL 0.3  0.4  0.5   Alkaline Phos 38 - 126 U/L 64  58    AST 15 - 41 U/L 29  27  28    ALT 0 - 44 U/L 24  28  20     . Lab Results  Component Value Date   IRON 92 07/10/2023   TIBC 361 07/10/2023   IRONPCTSAT 26 07/10/2023   (Iron and TIBC)  Lab Results  Component Value Date  FERRITIN 374 (H) 07/10/2023       RADIOGRAPHIC STUDIES: I have personally reviewed the radiological images as listed and agreed with the findings in the report. No results found.  ASSESSMENT & PLAN:   83 y.o. 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 CBC showed WBC of 4.7K, hemoglobin of 9.5, and platelets of 1,063K. -patient is mildly anemic at this time -platelets remain elevated at this time -discussed that her current platelet count is not significantly concerning given that the risk of blood clots is not as high with Calreticulin mutation compared to some other mutations -discussed that there is a need to balance hydroxyurea due to anemia while also controlling platelets -No notable toxicities from current dose of hydroxyurea -continue current dose of Hydroxyurea 1500 mg on 4 days a week and 1000 mg the rest of the days of the week  -will refill hydroxyurea -discussed that there is a role for higher than normal iron levels due to chronic inflammation from RA -recommend taking iron polysaccharide 3x/week: MWF -recommend taking vitamin B complex. Discussed that a deficiency in B vitmains may cause increased anemia -discussed that her head pain could be related to her arthritis -patient shall return to clinic with labs in 3 months -Continue to optimize rheumatoid arthritis treatment with rheumatologist   FOLLOW-UP: RTC with Dr Candise Che with labs in 3 months  The total time spent in the appointment was 30 minutes* .  All of the patient's questions were answered with apparent satisfaction. The patient knows to call the clinic with any problems, questions or concerns.   Wyvonnia Lora MD MS AAHIVMS [facility] Hematology/Oncology Physician [facility]  .*Total Encounter Time as defined by the Centers for Medicare and Medicaid Services includes, in addition to the face-to-face time of a patient visit (documented in the note above) non-face-to-face time: obtaining and reviewing outside history, ordering and reviewing medications, tests or procedures, care coordination  (communications with other health care professionals or caregivers) and documentation in the medical record.    I,Mitra Faeizi,acting as a Neurosurgeon for Wyvonnia Lora, MD.,have documented all relevant documentation on the behalf of Wyvonnia Lora, MD,as directed by  Wyvonnia Lora, MD while in the presence of Wyvonnia Lora, MD.  .I have reviewed the above documentation for accuracy and completeness, and I agree with the above. Johney Maine MD

## 2023-07-13 ENCOUNTER — Telehealth: Payer: Self-pay | Admitting: Hematology

## 2023-07-13 NOTE — Telephone Encounter (Signed)
 Spoke with patient confirming upcoming appointment

## 2023-07-16 ENCOUNTER — Encounter: Payer: Self-pay | Admitting: Hematology

## 2023-07-23 ENCOUNTER — Ambulatory Visit: Payer: Self-pay | Admitting: Internal Medicine

## 2023-07-23 ENCOUNTER — Encounter: Payer: Self-pay | Admitting: Internal Medicine

## 2023-07-23 ENCOUNTER — Ambulatory Visit (INDEPENDENT_AMBULATORY_CARE_PROVIDER_SITE_OTHER): Payer: Medicare Other | Admitting: Internal Medicine

## 2023-07-23 VITALS — BP 130/80 | HR 78 | Temp 98.3°F | Ht 59.75 in | Wt 141.0 lb

## 2023-07-23 DIAGNOSIS — R7302 Impaired glucose tolerance (oral): Secondary | ICD-10-CM

## 2023-07-23 DIAGNOSIS — M059 Rheumatoid arthritis with rheumatoid factor, unspecified: Secondary | ICD-10-CM | POA: Diagnosis not present

## 2023-07-23 DIAGNOSIS — L219 Seborrheic dermatitis, unspecified: Secondary | ICD-10-CM

## 2023-07-23 MED ORDER — NEOMYCIN-POLYMYXIN-HC 1 % OT SOLN: 3.0000 [drp] | Freq: Four times a day (QID) | OTIC | 0 refills | Status: DC

## 2023-07-23 MED ORDER — METHYLPREDNISOLONE 4 MG PO TBPK: ORAL_TABLET | ORAL | 0 refills | Status: DC

## 2023-07-23 NOTE — Telephone Encounter (Signed)
  Chief Complaint: itching inside mouth, throat and ears Symptoms: cough, itching inside mouth, throat, ears. Took benadryl with some relief but not helping now.  Frequency: 1 week  Pertinent Negatives: Patient denies chest pain no difficulty breathing no difficulty swallowing no fever Disposition: [] ED /[] Urgent Care (no appt availability in office) / [x] Appointment(In office/virtual)/ []  [place] [facility]/ [] Home Care/ [] Refused Recommended Disposition /[] [place] [facility]/ []  Follow-up with PCP Additional Notes:   Scheduled appt today due to patient c/o x 1 week and OTC not working.       Reason for Disposition  [1] MODERATE-SEVERE local itching (i.e., interferes with work, school, activities) AND [2] not improved after 24 hours of hydrocortisone cream  Answer Assessment - Initial Assessment Questions 1. DESCRIPTION: "Describe the itching you are having." "Where is it located?"     Inside back of mouth, throat and ears 2. SEVERITY: "How bad is it?"    - MILD: Doesn't interfere with normal activities.   - MODERATE-SEVERE: Interferes with work, school, sleep, or other activities.      Worsening x 1 week 3. SCRATCHING: "Are there any scratch marks? Bleeding?"     Na  4. ONSET: "When did the itching begin?"      1 week ago  5. CAUSE: "What do you think is causing the itching?"      Not sure  6. OTHER SYMPTOMS: "Do you have any other symptoms?"      cough 7. PREGNANCY: "Is there any chance you are pregnant?" "When was your last menstrual period?"     Na  Protocols used: Itching - Localized-A-AH

## 2023-07-23 NOTE — Telephone Encounter (Signed)
 Copied From CRM [id number]. Reason for Triage: The patient states she may be having an allergic reaction to something, she's experiencing itchiness around the mouth/throat and her ears. Callback #:[phone]

## 2023-07-23 NOTE — Progress Notes (Signed)
 Patient Care Team: Margaree Mackintosh, MD as PCP - Tanya Stabs, MD as Consulting Physician (Gastroenterology) Rossie Muskrat, MD (Rheumatology)  Visit Date: 07/23/23  Subjective:   Chief Complaint  Patient presents with   [facility]    Patient complains of throat itching and headache that started a few days ago.     Headache  Patient NU:[id number] P Perkin,Female DOB:[birth year],83 y.o. UYQ:[id number]   83 y.o. Female presents today for acute sick visit with Headache and Pruritus of Ears/Throat. Last year, 08/2022, endorsed a similar symptom as she described her left ear had been itching - prescribed Cortisporin 1%. Today reports that a couple of days ago she began to develop itching in her throat and a headache. Denies chest pain or recent stress.  Past Medical History:  Diagnosis Date   Arthritis    Cervical stenosis of spine    Dysphasia    Essential hypertension, benign    GERD (gastroesophageal reflux disease)    Glaucoma    Heart murmur    History of estrogen therapy    Hypercholesteremia    IBS (irritable bowel syndrome)    Mitral regurgitation    Pulmonary air embolism (HCC)    states she had this wiht her back surgery   Rheumatoid arthritis (HCC)    Seborrhea    patient denies    Allergies  Allergen Reactions   Alendronate    Allopurinol Swelling   Amoxicillin-Pot Clavulanate Diarrhea and Itching    PATIENT DOES NOT RECALL ALLERGIC REACTION TO AUGMENTIN.  Has patient had a PCN reaction causing immediate rash, facial/tongue/throat swelling, SOB or lightheadedness with hypotension: UNKNOWN Has patient had a PCN reaction causing severe rash involving mucus membranes or skin necrosis: UNKNOWN Has patient had a PCN reaction that required hospitalization No Has patient had a PCN reaction occurring within the last 10 years: No If all of the above answers are "NO", then may proceed    Folic Acid    Methotrexate Derivatives    Latex Itching    Latex gloves     Tramadol Nausea And Vomiting    Family History  Problem Relation Age of Onset   Healthy Daughter    Social History   Social History Narrative   Lives with daughter   Caffeine use: none      Social history: She is retired.  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  Does not smoke or consume alcohol.  She is a widow.       Family history: Mother died of an MI with history of hypertension who was 85 years of age at the time of her event.  1 brother with history of adult onset diabetes, 5 sisters-one of them has diabetes.  1 daughter died of an occult GI malignancy.  1 brother in good health.  She has 9 children.  1 daughter with history of pulmonary sarcoidosis years ago that resolved.   Review of Systems  Cardiovascular:  Negative for chest pain.  Skin:  Positive for itching (Ears).  Neurological:  Positive for headaches.  Psychiatric/Behavioral:         (-) Acute stress  All other systems reviewed and are negative.    Objective:  Vitals: BP 130/80   Pulse 78   Temp 98.3 F (36.8 C)   Ht 4' 11.75" (1.518 m)   Wt 141 lb (64 kg)   SpO2 98%   BMI 27.77 kg/m  Physical Exam Vitals and nursing note reviewed.  Constitutional:      General: She is not in acute distress.    Appearance: Normal appearance. She is not toxic-appearing.  HENT:     Head: Normocephalic and atraumatic.     Right Ear: Tympanic membrane, ear canal and external ear normal.     Left Ear: Tympanic membrane, ear canal and external ear normal.     Mouth/Throat:     Pharynx: Oropharynx is clear.  Pulmonary:     Effort: Pulmonary effort is normal. No respiratory distress.     Breath sounds: Normal breath sounds.  Skin:    General: Skin is warm and dry.  Neurological:     Mental Status: She is alert and oriented to person, place, and time. Mental status is at baseline.  Psychiatric:        Mood and Affect: Mood normal.        Behavior:  Behavior normal.        Thought Content: Thought content normal.        Judgment: Judgment normal.     Results:  Studies Obtained And Personally Reviewed By Me: Labs:     Component Value Date/Time   NA 136 07/10/2023 0857   NA 143 07/15/2022 0000   K 4.4 07/10/2023 0857   CL 104 07/10/2023 0857   CO2 26 07/10/2023 0857   GLUCOSE 74 07/10/2023 0857   BUN 29 (H) 07/10/2023 0857   BUN 22 (A) 07/15/2022 0000   CREATININE 0.96 07/10/2023 0857   CREATININE 0.83 02/27/2023 0918   CALCIUM 9.3 07/10/2023 0857   PROT 7.0 07/10/2023 0857   ALBUMIN 4.1 07/10/2023 0857   AST 29 07/10/2023 0857   ALT 24 07/10/2023 0857   ALKPHOS 64 07/10/2023 0857   BILITOT 0.3 07/10/2023 0857   GFRNONAA 58 (L) 07/10/2023 0857   GFRNONAA 59 (L) 06/22/2020 0907   GFRAA 69 06/22/2020 0907    Lab Results  Component Value Date   WBC 4.7 07/10/2023   HGB 9.5 (L) 07/10/2023   HCT 28.7 (L) 07/10/2023   MCV 123.2 (H) 07/10/2023   PLT 1,063 (HH) 07/10/2023   Lab Results  Component Value Date   CHOL 167 02/27/2023   HDL 61 02/27/2023   LDLCALC 92 02/27/2023   TRIG 53 02/27/2023   CHOLHDL 2.7 02/27/2023   Lab Results  Component Value Date   HGBA1C 6.1 (H) 02/27/2023    Lab Results  Component Value Date   TSH 2.73 02/27/2023   Assessment & Plan:   Seborrhea, Ears Bilaterally:   Rx 1% Cortisporin Otic suspension - place 3 drops into the left ear 4 (four) times daily.    Pruritus- etiology unclear- no rash noted no pharyngeal or eye edema- pt requesting Depomedrol 80 mg IM given in office.  Return if symptoms do not improve within a few    I,Emily Lagle,acting as a scribe for Margaree Mackintosh, MD.,have documented all relevant documentation on the behalf of Margaree Mackintosh, MD,as directed by  Margaree Mackintosh, MD while in the presence of Margaree Mackintosh, MD.   I, Margaree Mackintosh, MD, have reviewed all documentation for this visit. The documentation on 07/24/23 for the exam, diagnosis, procedures, and  orders are all accurate and complete.

## 2023-07-24 NOTE — Patient Instructions (Addendum)
 Depomedrol 80 mg IM given at pt request. May use Cortisporin otic suspension for ear itching 4 drops in each ear 2-3 times a day as needed.

## 2023-08-04 ENCOUNTER — Other Ambulatory Visit: Payer: Self-pay

## 2023-08-04 DIAGNOSIS — D473 Essential (hemorrhagic) thrombocythemia: Secondary | ICD-10-CM

## 2023-08-04 MED ORDER — HYDROXYUREA 500 MG PO CAPS: ORAL_CAPSULE | ORAL | 4 refills | Status: DC

## 2023-08-18 DIAGNOSIS — M810 Age-related osteoporosis without current pathological fracture: Secondary | ICD-10-CM | POA: Diagnosis not present

## 2023-08-18 DIAGNOSIS — I10 Essential (primary) hypertension: Secondary | ICD-10-CM | POA: Diagnosis not present

## 2023-08-18 DIAGNOSIS — G5600 Carpal tunnel syndrome, unspecified upper limb: Secondary | ICD-10-CM | POA: Diagnosis not present

## 2023-08-18 DIAGNOSIS — D473 Essential (hemorrhagic) thrombocythemia: Secondary | ICD-10-CM | POA: Diagnosis not present

## 2023-08-18 DIAGNOSIS — M1990 Unspecified osteoarthritis, unspecified site: Secondary | ICD-10-CM | POA: Diagnosis not present

## 2023-08-18 DIAGNOSIS — Z8739 Personal history of other diseases of the musculoskeletal system and connective tissue: Secondary | ICD-10-CM | POA: Diagnosis not present

## 2023-08-18 DIAGNOSIS — M059 Rheumatoid arthritis with rheumatoid factor, unspecified: Secondary | ICD-10-CM | POA: Diagnosis not present

## 2023-08-18 DIAGNOSIS — M542 Cervicalgia: Secondary | ICD-10-CM | POA: Diagnosis not present

## 2023-08-18 DIAGNOSIS — M25511 Pain in right shoulder: Secondary | ICD-10-CM | POA: Diagnosis not present

## 2023-08-19 ENCOUNTER — Other Ambulatory Visit: Payer: Self-pay | Admitting: Internal Medicine

## 2023-09-01 ENCOUNTER — Other Ambulatory Visit: Payer: Medicare Other

## 2023-09-01 DIAGNOSIS — E119 Type 2 diabetes mellitus without complications: Secondary | ICD-10-CM | POA: Diagnosis not present

## 2023-09-01 DIAGNOSIS — R7302 Impaired glucose tolerance (oral): Secondary | ICD-10-CM

## 2023-09-01 DIAGNOSIS — E7800 Pure hypercholesterolemia, unspecified: Secondary | ICD-10-CM

## 2023-09-01 DIAGNOSIS — N1831 Chronic kidney disease, stage 3a: Secondary | ICD-10-CM | POA: Diagnosis not present

## 2023-09-01 NOTE — Addendum Note (Signed)
 Addended by: Thelma Barge D on: 09/01/2023 09:14 AM   Modules accepted: Orders

## 2023-09-01 NOTE — Addendum Note (Signed)
 Addended by: Thelma Barge D on: 09/01/2023 09:12 AM   Modules accepted: Orders

## 2023-09-01 NOTE — Addendum Note (Signed)
 Addended by: Thelma Barge D on: 09/01/2023 09:11 AM   Modules accepted: Orders

## 2023-09-01 NOTE — Addendum Note (Signed)
 Addended by: Thelma Barge D on: 09/01/2023 09:15 AM   Modules accepted: Orders

## 2023-09-01 NOTE — Progress Notes (Signed)
 Patient Care Team: Margaree Mackintosh, MD as PCP - Ricky Stabs, MD as Consulting Physician (Gastroenterology) Rossie Muskrat, MD (Rheumatology)  Visit Date: 09/04/23  Subjective:   Chief Complaint  Patient presents with   Follow-up  Patient WJ:XBJYNW P Yochim,Female DOB:1941-02-24,83 y.o. GNF:[id number]   83 y.o. Female presents today for 6 months follow-up for Hypertension, Hyperlipidemia, and Impaired Glucose Tolerance. Patient has a past medical history of Essential Thrombocytosis; Anemia; Macrocytosis; Osteoarthritis; Seropositive Rheumatoid Arthritis. Seen for her annual on 03/03/2023 and again on 07/23/2023 for Seborrhea treated with Cortisporin Otic 1%. Betwixt those visits she has seen Dr. Haydee Salter, Rheumatologist x2 (once 02/2023, again 07/2023), Ophthalmology, and Dr. Wyvonnia Lora, Oncologist x2 (once 03/2023, again 06/2023).     History of Hypertension with Blood Pressure: normotensive today at 124/84.  History of Hyperlipidemia treated with Atorvastatin 20 mg daily . 09/01/2023 Lipid Panel WNL.  History of Impaired Glucose Tolerance with 09/01/2023 Glucose 28; HgbA1c 5.9. She says that she eats daily, and does eat breakfast in the morning though hadn't before labs that day.   Reviewed 09/01/2023 Microalbumin/Creatinine: Creatinine 14, Microalbumin & Microalb/Creatine Ratio was normal otherwise. C-MET with BUN 28, BUN/Creatinine 31, and (likely hemolyzed) Potassium at 6.0.   History of Essential Thrombocytosis treated with Hydrea 500: 3 capsules Mon-Tues, Thurs-Fri 2 capsules Wed, Sat-Sun w/ food. Followed by Dr. Candise Che, who also oversees management of her Anemia; Macrocytosis.    History of Osteoarthritis; in 2013 was diagnosed w/ Seropositive Rheumatoid Arthritis treated with low-dose Prednisone; RA followed by Dr. Kathi Ludwig. Says that this has been better managed recently, though her neck and knee still bother her.   Vaccine Counseling: Covid-19 - she says that she did update  her vaccination last fall, and plans on updating her booster in September.  Past Medical History:  Diagnosis Date   Arthritis    Cervical stenosis of spine    Dysphasia    Essential hypertension, benign    GERD (gastroesophageal reflux disease)    Glaucoma    Heart murmur    History of estrogen therapy    Hypercholesteremia    IBS (irritable bowel syndrome)    Mitral regurgitation    Pulmonary air embolism (HCC)    states she had this wiht her back surgery   Rheumatoid arthritis (HCC)    Seborrhea    patient denies    Allergies  Allergen Reactions   Alendronate    Allopurinol Swelling   Amoxicillin-Pot Clavulanate Diarrhea and Itching    PATIENT DOES NOT RECALL ALLERGIC REACTION TO AUGMENTIN.  Has patient had a PCN reaction causing immediate rash, facial/tongue/throat swelling, SOB or lightheadedness with hypotension: UNKNOWN Has patient had a PCN reaction causing severe rash involving mucus membranes or skin necrosis: UNKNOWN Has patient had a PCN reaction that required hospitalization No Has patient had a PCN reaction occurring within the last 10 years: No If all of the above answers are "NO", then may proceed    Folic Acid    Methotrexate Derivatives    Latex Itching    Latex gloves    Tramadol Nausea And Vomiting    Family History  Problem Relation Age of Onset   Healthy Daughter    Social History   Social History Narrative   Lives with daughter   Caffeine use: none      Social history: She is retired.  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  Does not smoke  or consume alcohol.  She is a widow.       Family history: Mother died of an MI with history of hypertension who was 85 years of age at the time of her event.  1 brother with history of adult onset diabetes, 5 sisters-one of them has diabetes.  1 daughter died of an occult GI malignancy.  1 brother in good health.  She has 9 children.  1  daughter with history of pulmonary sarcoidosis years ago that resolved.  Granddaughter & great-granddaughters living in [place], who she recently visited.  Does not drive. Her daughter, Vikki Ports, is her transport.  Review of Systems  Musculoskeletal:        (+) Arthralgias - Knee & Neck specifically  All other systems reviewed and are negative.    Objective:  Vitals: BP 124/84   Pulse 62   Temp 98.5 F (36.9 C) (Temporal)   Ht 4' 11.5" (1.511 m)   Wt 141 lb 1.9 oz (64 kg)   SpO2 98%   BMI 28.03 kg/m   Physical Exam Vitals and nursing note reviewed.  Constitutional:      General: She is not in acute distress.    Appearance: Normal appearance. She is not toxic-appearing.  HENT:     Head: Normocephalic and atraumatic.  Pulmonary:     Effort: Pulmonary effort is normal.  Skin:    General: Skin is warm and dry.  Neurological:     Mental Status: She is alert and oriented to person, place, and time. Mental status is at baseline.  Psychiatric:        Mood and Affect: Mood normal.        Behavior: Behavior normal.        Thought Content: Thought content normal.        Judgment: Judgment normal.    Results:  Studies Obtained And Personally Reviewed By Me: Labs:     Component Value Date/Time   NA 140 09/01/2023 0928   NA 143 07/15/2022 0000   K 6.0 (H) 09/01/2023 0928   CL 102 09/01/2023 0928   CO2 27 09/01/2023 0928   GLUCOSE 28 (LL) 09/01/2023 0928   BUN 28 (H) 09/01/2023 0928   BUN 22 (A) 07/15/2022 0000   CREATININE 0.90 09/01/2023 0928   CALCIUM 10.1 09/01/2023 0928   PROT 7.3 09/01/2023 0928   ALBUMIN 4.1 07/10/2023 0857   AST 33 09/01/2023 0928   AST 29 07/10/2023 0857   ALT 23 09/01/2023 0928   ALT 24 07/10/2023 0857   ALKPHOS 64 07/10/2023 0857   BILITOT 0.6 09/01/2023 0928   BILITOT 0.3 07/10/2023 0857   GFRNONAA 58 (L) 07/10/2023 0857   GFRNONAA 59 (L) 06/22/2020 0907   GFRAA 69 06/22/2020 0907    Lab Results  Component Value Date   WBC 4.7 07/10/2023    HGB 9.5 (L) 07/10/2023   HCT 28.7 (L) 07/10/2023   MCV 123.2 (H) 07/10/2023   PLT 1,063 (HH) 07/10/2023   Lab Results  Component Value Date   CHOL 172 09/01/2023   HDL 62 09/01/2023   LDLCALC 93 09/01/2023   TRIG 84 09/01/2023   CHOLHDL 2.8 09/01/2023   Lab Results  Component Value Date   HGBA1C 5.9 (H) 09/01/2023    Lab Results  Component Value Date   TSH 2.73 02/27/2023   Assessment & Plan:   Hypertension : blood pressure today normal  at 124/84.  Hyperlipidemia treated with Atorvastatin 20 mg daily . 09/01/2023 Lipid Panel is  WNL.  Impaired Glucose Tolerance with 09/01/2023 Glucose 28; HgbA1c 5.9%. Regarding her extremely low glucose, she said that she hadn't eaten prior to those labs, but does normally eat breakfast and does eat regularly.Low glucose on this lab draw thought to be an error.  Reviewed 09/01/2023 Microalbumin/Creatinine: Creatinine 14, Microalbumin & Microalb/Creatine Ratio was normal otherwise. C-MET with BUN 28, BUN/Creatinine 31, and (likely hemolyzed) Potassium at 6.0.   Essential Thrombocytosis Calreticulin positive- treated with Hydrea 500mg : 3 capsules Mon-Tues, Thurs-Fri 2 capsules Wed, Sat-Sun w/ food. Followed by Dr. Candise Che.  Chronic macrocytic anemia followed by Dr. Candise Che.   Seropositive Rheumatoid Arthritis followed by Dr. Kathi Ludwig. Says that this has been better managed recently, though her neck and knee still bother her.   Vaccine Counseling: Covid-19 - she says that she did update her vaccination last fall, and plans on updating her booster in September.   Return in about 6 months (around 03/05/2024) for annual visit.  Addendum: I do not think her potassium is  actually 6.0. I think specimen was hemolyzed. She is not on potassium supplementation. I do not see any meds that would cause this on her med list. Potassium was normal in February. I do not think her glucose was actually 28 either. She is not on any diabetic meds and was told to come fasting.  Has never had hypoglycemia before and reported no symptoms at blood draw.  I,Emily Lagle,acting as a Neurosurgeon for Margaree Mackintosh, MD.,have documented all relevant documentation on the behalf of Margaree Mackintosh, MD,as directed by  Margaree Mackintosh, MD while in the presence of Margaree Mackintosh, MD.   I, Margaree Mackintosh, MD, have reviewed all documentation for this visit. The documentation on 09/04/23 for the exam, diagnosis, procedures, and orders are all accurate and complete.

## 2023-09-02 ENCOUNTER — Ambulatory Visit: Payer: Self-pay | Admitting: Internal Medicine

## 2023-09-02 LAB — COMPLETE METABOLIC PANEL WITHOUT GFR
AG Ratio: 1.6 (calc) (ref 1.0–2.5)
ALT: 23 U/L (ref 6–29)
AST: 33 U/L (ref 10–35)
Albumin: 4.5 g/dL (ref 3.6–5.1)
Alkaline phosphatase (APISO): 56 U/L (ref 37–153)
BUN/Creatinine Ratio: 31 (calc) — ABNORMAL HIGH (ref 6–22)
BUN: 28 mg/dL — ABNORMAL HIGH (ref 7–25)
CO2: 27 mmol/L (ref 20–32)
Calcium: 10.1 mg/dL (ref 8.6–10.4)
Chloride: 102 mmol/L (ref 98–110)
Creat: 0.9 mg/dL (ref 0.60–0.95)
Globulin: 2.8 g/dL (ref 1.9–3.7)
Glucose, Bld: 28 mg/dL — CL (ref 65–99)
Potassium: 6 mmol/L — ABNORMAL HIGH (ref 3.5–5.3)
Sodium: 140 mmol/L (ref 135–146)
Total Bilirubin: 0.6 mg/dL (ref 0.2–1.2)
Total Protein: 7.3 g/dL (ref 6.1–8.1)

## 2023-09-02 LAB — LIPID PANEL
Cholesterol: 172 mg/dL (ref <200)
HDL: 62 mg/dL (ref >=50)
LDL Cholesterol (Calc): 93 mg/dL
Non-HDL Cholesterol (Calc): 110 mg/dL (ref <130)
Total CHOL/HDL Ratio: 2.8 (calc) (ref <5.0)
Triglycerides: 84 mg/dL (ref <150)

## 2023-09-02 LAB — MICROALBUMIN / CREATININE URINE RATIO
Creatinine, Urine: 14 mg/dL — ABNORMAL LOW (ref 20–275)
Microalb, Ur: <0.2 mg/dL

## 2023-09-02 LAB — HEMOGLOBIN A1C
Hgb A1c MFr Bld: 5.9 %{Hb} — ABNORMAL HIGH (ref <5.7)
Mean Plasma Glucose: 123 mg/dL
eAG (mmol/L): 6.8 mmol/L

## 2023-09-02 NOTE — Telephone Encounter (Signed)
 Copied from CRM [id number]. Topic: Clinical - Lab/Test Results >> Sep 02, 2023  8:01 AM Clayton Bibles wrote: Reason for CRM:  Rene Kocher with [facility] results    Chief Complaint: critical labs results  Additional Notes: [facility] calling with critical glucose of 28, call made to CAL, no answer. Will route HP to clinic for F/U   Reason for Disposition  Lab or radiology calling with CRITICAL test results  Answer Assessment - Initial Assessment Questions 1. REASON FOR CALL or QUESTION: "What is your reason for calling today?" or "How can I best help you?" or "What question do you have that I can help answer?"     Critical lab results  2. CALLER: Document the source of call. (e.g., laboratory, patient).     [facility]  Protocols used: PCP Call - No Triage-A-AH

## 2023-09-02 NOTE — Telephone Encounter (Addendum)
 Copied from CRM [id number]. Topic: Clinical - Lab/Test Results >> Sep 02, 2023  8:01 AM Clayton Bibles wrote: Reason for CRM:  Rene Kocher with [facility] results

## 2023-09-02 NOTE — Telephone Encounter (Deleted)
 Reason for Disposition . Lab or radiology calling with CRITICAL test results  Answer Assessment - Initial Assessment Questions 1. REASON FOR CALL or QUESTION: "What is your reason for calling today?" or "How can I best help you?" or "What question do you have that I can help answer?"     Critical lab results  2. CALLER: Document the source of call. (e.g., laboratory, patient).     [facility]  Protocols used: PCP Call - No Triage-A-AH

## 2023-09-02 NOTE — Telephone Encounter (Signed)
 Noted.

## 2023-09-04 ENCOUNTER — Ambulatory Visit (INDEPENDENT_AMBULATORY_CARE_PROVIDER_SITE_OTHER): Payer: Medicare Other | Admitting: Internal Medicine

## 2023-09-04 ENCOUNTER — Encounter: Payer: Self-pay | Admitting: Internal Medicine

## 2023-09-04 VITALS — BP 124/84 | HR 62 | Temp 98.5°F | Ht 59.5 in | Wt 141.1 lb

## 2023-09-04 DIAGNOSIS — R7302 Impaired glucose tolerance (oral): Secondary | ICD-10-CM

## 2023-09-04 DIAGNOSIS — K2100 Gastro-esophageal reflux disease with esophagitis, without bleeding: Secondary | ICD-10-CM | POA: Diagnosis not present

## 2023-09-04 DIAGNOSIS — N1831 Chronic kidney disease, stage 3a: Secondary | ICD-10-CM

## 2023-09-04 DIAGNOSIS — E7800 Pure hypercholesterolemia, unspecified: Secondary | ICD-10-CM | POA: Diagnosis not present

## 2023-09-04 DIAGNOSIS — M059 Rheumatoid arthritis with rheumatoid factor, unspecified: Secondary | ICD-10-CM

## 2023-09-04 DIAGNOSIS — D473 Essential (hemorrhagic) thrombocythemia: Secondary | ICD-10-CM | POA: Diagnosis not present

## 2023-09-04 NOTE — Patient Instructions (Addendum)
 It was a pleasure to see you today. Continue current meds And return in 6 months medicare wellness and annual exam.

## 2023-10-05 DIAGNOSIS — G5600 Carpal tunnel syndrome, unspecified upper limb: Secondary | ICD-10-CM | POA: Diagnosis not present

## 2023-10-05 DIAGNOSIS — M1990 Unspecified osteoarthritis, unspecified site: Secondary | ICD-10-CM | POA: Diagnosis not present

## 2023-10-05 DIAGNOSIS — Z8739 Personal history of other diseases of the musculoskeletal system and connective tissue: Secondary | ICD-10-CM | POA: Diagnosis not present

## 2023-10-05 DIAGNOSIS — M810 Age-related osteoporosis without current pathological fracture: Secondary | ICD-10-CM | POA: Diagnosis not present

## 2023-10-05 DIAGNOSIS — M7072 Other bursitis of hip, left hip: Secondary | ICD-10-CM | POA: Diagnosis not present

## 2023-10-05 DIAGNOSIS — I10 Essential (primary) hypertension: Secondary | ICD-10-CM | POA: Diagnosis not present

## 2023-10-05 DIAGNOSIS — D473 Essential (hemorrhagic) thrombocythemia: Secondary | ICD-10-CM | POA: Diagnosis not present

## 2023-10-05 DIAGNOSIS — M059 Rheumatoid arthritis with rheumatoid factor, unspecified: Secondary | ICD-10-CM | POA: Diagnosis not present

## 2023-10-07 NOTE — Progress Notes (Signed)
 HEMATOLOGY/ONCOLOGY CLINIC VISIT NOTE  Date of Service: 10/09/2023    Patient Care Team: Sylvan Evener, MD as PCP - Velna Ghee, MD as Consulting Physician (Gastroenterology) Alvester Aw, MD (Rheumatology)  CHIEF COMPLAINTS/PURPOSE OF CONSULTATION:  Follow-up for continued evaluation and management of Essential thrombocytosis Caleticulin positive  HISTORY OF PRESENTING ILLNESS:  Previous notes for details on initial presentation  INTERVAL HISTORY:  [name] is a 83 y.o. female who is here for continued evaluation and management of her Calreticulin positive essential thrombocytosis. Patient was last seen by me on 07/10/2023 and complained of bothersome carpul tunnel sydrome worse at night, RA flares in areas including her neck and left knee, and head pain/tightness.   Patient reports that she recently received a cortisone shot into her hip, which improved symptoms. She recently restarted 5 MG Prednisone  temporarily. Patient is not on any other medication for RA management.   She continues to take 3 tablets of hydroxyurea  4 days a week and 2 tablets for the rest of the week. Patient has been tolerating hydroxyurea  with no new or major toxicity issues.   She has been tolerating oral iron  with no toxicity issues.    She continues to have neck pain at night, which her rheumatologist feels is due to RA. Patient has no neck pain during the day.   Patient denies any abdominal pain, changes in bowel habits, leg swelling, new SOB, chest pain, or new fatigue.  She reports that she has been eating well. Patient's weight has been stable at 141 pounds.   MEDICAL HISTORY:  Past Medical History:  Diagnosis Date   Arthritis    Cervical stenosis of spine    Dysphasia    Essential hypertension, benign    GERD (gastroesophageal reflux disease)    Glaucoma    Heart murmur    History of estrogen therapy    Hypercholesteremia    IBS (irritable bowel syndrome)    Mitral  regurgitation    Pulmonary air embolism (HCC)    states she had this wiht her back surgery   Rheumatoid arthritis (HCC)    Seborrhea    patient denies    SURGICAL HISTORY: Past Surgical History:  Procedure Laterality Date   ANTERIOR AND POSTERIOR REPAIR WITH SACROSPINOUS FIXATION N/A 01/08/2015   Procedure: ANTERIOR AND POSTERIOR REPAIR WITH possible SACROSPINOUS FIXATION;  Surgeon: Woodrow Hazy, MD;  Location: [facility];  Service: Gynecology;  Laterality: N/A;   BACK SURGERY     CATARACT EXTRACTION W/ INTRAOCULAR LENS  IMPLANT, BILATERAL Bilateral    COLONOSCOPY     EYE SURGERY     LAPAROSCOPIC ASSISTED VAGINAL HYSTERECTOMY N/A 01/08/2015   Procedure: LAPAROSCOPIC ASSISTED VAGINAL HYSTERECTOMY;  Surgeon: Woodrow Hazy, MD;  Location: [facility];  Service: Gynecology;  Laterality: N/A;   LUMBAR FUSION  05/2010   REVERSE SHOULDER ARTHROPLASTY Left 02/16/2014   Procedure: LEFT REVERSE TOTAL SHOULDER ARTHROPLASTY;  Surgeon: Glo Larch, MD;  Location: [facility];  Service: Orthopedics;  Laterality: Left;   REVERSE SHOULDER ARTHROPLASTY Right 08/07/2016   REVERSE SHOULDER ARTHROPLASTY Right 08/07/2016   Procedure: REVERSE RIGHT SHOULDER ARTHROPLASTY;  Surgeon: Ellard Gunning, MD;  Location: [facility];  Service: Orthopedics;  Laterality: Right;   TOE SURGERY     TUBAL LIGATION      SOCIAL HISTORY: Social History   Socioeconomic History   Marital status: Widowed    Spouse name: Not on file   Number of children: 9   Years of education:  7   Highest education level: Not on file  Occupational History   Not on file  Tobacco Use   Smoking status: Never   Smokeless tobacco: Never  Substance and Sexual Activity   Alcohol use: No   Drug use: No   Sexual activity: Never  Other Topics Concern   Not on file  Social History Narrative   Lives with daughter   Caffeine use: none      Social history: She is retired.  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  Does not smoke or consume alcohol.  She is a widow.       Family history: Mother died of an MI with history of hypertension who was 85 years of age at the time of her event.  1 brother with history of adult onset diabetes, 5 sisters-one of them has diabetes.  1 daughter died of an occult GI malignancy.  1 brother in good health.  She has 9 children.  1 daughter with history of pulmonary sarcoidosis years ago that resolved.   Social Drivers of Corporate investment banker Strain: Low Risk  (03/03/2023)   Overall Financial Resource Strain (CARDIA)    Difficulty of Paying Living Expenses: Not hard at all  Food Insecurity: No Food Insecurity (03/03/2023)   Hunger Vital Sign    Worried About Running Out of Food in the Last Year: Never true    Ran Out of Food in the Last Year: Never true  Transportation Needs: No Transportation Needs (03/03/2023)   PRAPARE - Administrator, Civil Service (Medical): No    Lack of Transportation (Non-Medical): No  Physical Activity: Insufficiently Active (03/03/2023)   Exercise Vital Sign    Days of Exercise per Week: 7 days    Minutes of Exercise per Session: 10 min  Stress: No Stress Concern Present (03/03/2023)   [employer] of Occupational Health - Occupational Stress Questionnaire    Feeling of Stress : Not at all  Social Connections: Moderately Integrated (03/03/2023)   Social Connection and Isolation Panel [NHANES]    Frequency of Communication with Friends and Family: More than three times a week    Frequency of Social Gatherings with Friends and Family: More than three times a week    Attends Religious Services: More than 4 times per year    Active Member of [employer] or Organizations: Yes    Attends Engineer, structural: More than 4 times per year    Marital Status: Divorced  Intimate Partner Violence: Not At Risk (03/03/2023)   Humiliation, Afraid, Rape, and Kick questionnaire    Fear of Current or  Ex-Partner: No    Emotionally Abused: No    Physically Abused: No    Sexually Abused: No    FAMILY HISTORY: Family History  Problem Relation Age of Onset   Healthy Daughter     ALLERGIES:  is allergic to alendronate, allopurinol, amoxicillin-pot clavulanate, folic acid , methotrexate derivatives, latex, and tramadol .  MEDICATIONS:  Current Outpatient Medications  Medication Sig Dispense Refill   aspirin  81 MG tablet Take 81 mg by mouth daily.     atorvastatin  (LIPITOR) 20 MG tablet TAKE 1 TABLET BY MOUTH DAILY 100 tablet 2   clotrimazole -betamethasone  (LOTRISONE ) cream Apply 1 application topically 2 (two) times daily. 45 g 2   hydroxyurea  (HYDREA ) 500 MG capsule TAKE 3 CAPSULES BY MOUTH MONDAYS TUESDAY THURSDAY AND FRIDAYS AND 2 CAPSULES BY MOUTH  ON WEDNESDAY SATURDAY SUNDAYS TAKE WITH FOOD 90 capsule 4   lansoprazole  (PREVACID ) 30 MG capsule Take 1 capsule (30 mg total) by mouth daily at 12 noon. (Patient not taking: Reported on 09/04/2023) 30 capsule    methylPREDNISolone  (MEDROL  DOSEPAK) 4 MG TBPK tablet Take in tapering course as directed for itching 6-5-4-3-2-1 21 tablet 0   mupirocin  ointment (BACTROBAN ) 2 % APPLY TOPICALLY TO THE AFFECTED AREA TWICE DAILY AS NEEDED (Patient not taking: Reported on 09/04/2023) 22 g 11   NEOMYCIN -POLYMYXIN-HYDROCORTISONE (CORTISPORIN) 1 % SOLN OTIC solution Place 3 drops into the left ear 4 (four) times daily. (Patient not taking: Reported on 09/04/2023) 10 mL 0   polyethylene glycol powder (GLYCOLAX /MIRALAX ) powder MIX 1 CAPFUL IN 8 OUNCES OF WATER AND DRINK ONCE DAILY AS DIRECTED (Patient not taking: Reported on 09/04/2023) 527 g 0   No current facility-administered medications for this visit.    REVIEW OF SYSTEMS:    10 Point review of Systems was done is negative except as noted above.   PHYSICAL EXAMINATION:  .BP (!) 187/71 Comment: RN Beth T. made aware  Pulse 75   Temp (!) 97 F (36.1 C)   Resp 20   Wt 141 lb 14.4 oz (64.4 kg)   SpO2  100%   BMI 28.18 kg/m  GENERAL:alert, in no acute distress and comfortable SKIN: no acute rashes, no significant lesions EYES: conjunctiva are pink and non-injected, sclera anicteric OROPHARYNX: MMM, no exudates, no oropharyngeal erythema or ulceration NECK: supple, no JVD LYMPH:  no palpable lymphadenopathy in the cervical, axillary or inguinal regions LUNGS: clear to auscultation b/l with normal respiratory effort HEART: regular rate & rhythm ABDOMEN:  normoactive bowel sounds , non tender, not distended. Extremity: no pedal edema PSYCH: alert & oriented x 3 with fluent speech NEURO: no focal motor/sensory deficits   LABORATORY DATA:  I have reviewed the data as listed .    Latest Ref Rng & Units 07/10/2023    8:57 AM 04/03/2023    9:29 AM 02/27/2023    9:18 AM  CBC  WBC 4.0 - 10.5 K/uL 4.7  5.8  4.3   Hemoglobin 12.0 - 15.0 g/dL 9.5  9.8  9.9   Hematocrit 36.0 - 46.0 % 28.7  29.2  29.5   Platelets 150 - 400 K/uL 1,063  1,137  941        Latest Ref Rng & Units 09/01/2023    9:28 AM 07/10/2023    8:57 AM 04/03/2023    9:29 AM  CMP  Glucose 65 - 99 mg/dL 28  74  70   BUN 7 - 25 mg/dL 28  29  30    Creatinine 0.60 - 0.95 mg/dL 6.04  5.40  9.81   Sodium 135 - 146 mmol/L 140  136  137   Potassium 3.5 - 5.3 mmol/L 6.0  4.4  3.9   Chloride 98 - 110 mmol/L 102  104  106   CO2 20 - 32 mmol/L 27  26  26    Calcium  8.6 - 10.4 mg/dL 19.1  9.3  8.9   Total Protein 6.1 - 8.1 g/dL 7.3  7.0  6.8   Total Bilirubin 0.2 - 1.2 mg/dL 0.6  0.3  0.4   Alkaline Phos 38 - 126 U/L  64  58   AST 10 - 35 U/L 33  29  27   ALT 6 - 29 U/L 23  24  28     . Lab Results  Component Value Date  IRON  92 07/10/2023   TIBC 361 07/10/2023   IRONPCTSAT 26 07/10/2023   (Iron  and TIBC)  Lab Results  Component Value Date   FERRITIN 374 (H) 07/10/2023       RADIOGRAPHIC STUDIES: I have personally reviewed the radiological images as listed and agreed with the findings in the report. No results  found.  ASSESSMENT & PLAN:   83 y.o. female with RA with   1) Normocytic Anemia -- likely from chronic inflammation from RA 2) Thrombocytosis-- related to Calreticulin positive Essential thrombocytosis 3) noncompliance with medication and follow-up x 1  PLAN:  -Discussed lab results on 10/09/2023 in detail with patient. CBC showed WBC of 8.7K, hemoglobin of 10.1, and platelets of 1217K. -hgb stable - likely improved from her steroids -platelets remain high at 1.26M, with mild fluctuation overall -CMP shows potassium 3.6 -iron  saturation stable at 28%  -Ferritin pending at this time -Patient temporarily on 5 MG Prednisone  at this time -discussed that Prednisone  cannot be used long term for RA management.  -discussed that her Calreticulin mutation does not have as high a risk of blood clots as some other mutations, which allows us  to keep her platelets around 60M. Discussed that we cannot increase dose of hydroxyurea  at this time due to concern for anemia. Her anemia is also from RA to some degree. -Discussed that inflammation from RA also pushes platelets causing anemia. If her RA is controlled well, then this may help her platelets and anemia, and allow us  to use adjust hydroxyurea . -No notable toxicities from current dose of hydroxyurea  -continue current dose of Hydroxyurea  1500 mg on 4 days a week and 1000 mg the rest of the days of the week  -continue aspirin  81 MG -continue oral iron  replacement -answered all of patient's questions in detail -Continue to optimize rheumatoid arthritis treatment with rheumatologist   FOLLOW-UP: RTC with Dr Salomon Cree with labs in 3 months  The total time spent in the appointment was 21 minutes* .  All of the patient's questions were answered with apparent satisfaction. The patient knows to call the clinic with any problems, questions or concerns.   Jacquelyn Matt MD MS AAHIVMS [facility] Hematology/Oncology Physician [facility]  .*Total  Encounter Time as defined by the Centers for Medicare and Medicaid Services includes, in addition to the face-to-face time of a patient visit (documented in the note above) non-face-to-face time: obtaining and reviewing outside history, ordering and reviewing medications, tests or procedures, care coordination (communications with other health care professionals or caregivers) and documentation in the medical record.   I,Mitra Faeizi,acting as a Neurosurgeon for Jacquelyn Matt, MD.,have documented all relevant documentation on the behalf of Jacquelyn Matt, MD,as directed by  Jacquelyn Matt, MD while in the presence of Jacquelyn Matt, MD.  .I have reviewed the above documentation for accuracy and completeness, and I agree with the above. .Dimetri Armitage Kishore Koreena Joost MD

## 2023-10-08 ENCOUNTER — Other Ambulatory Visit: Payer: Self-pay

## 2023-10-08 DIAGNOSIS — D649 Anemia, unspecified: Secondary | ICD-10-CM

## 2023-10-08 DIAGNOSIS — D473 Essential (hemorrhagic) thrombocythemia: Secondary | ICD-10-CM

## 2023-10-09 ENCOUNTER — Inpatient Hospital Stay

## 2023-10-09 ENCOUNTER — Ambulatory Visit: Payer: Medicare Other | Admitting: Hematology

## 2023-10-09 ENCOUNTER — Inpatient Hospital Stay: Attending: Hematology | Admitting: Hematology

## 2023-10-09 ENCOUNTER — Other Ambulatory Visit: Payer: Medicare Other

## 2023-10-09 ENCOUNTER — Telehealth: Payer: Self-pay | Admitting: *Deleted

## 2023-10-09 VITALS — BP 187/71 | HR 75 | Temp 97.0°F | Resp 20 | Wt 141.9 lb

## 2023-10-09 DIAGNOSIS — D649 Anemia, unspecified: Secondary | ICD-10-CM

## 2023-10-09 DIAGNOSIS — D473 Essential (hemorrhagic) thrombocythemia: Secondary | ICD-10-CM

## 2023-10-09 DIAGNOSIS — Z7982 Long term (current) use of aspirin: Secondary | ICD-10-CM | POA: Diagnosis not present

## 2023-10-09 DIAGNOSIS — M069 Rheumatoid arthritis, unspecified: Secondary | ICD-10-CM | POA: Insufficient documentation

## 2023-10-09 LAB — RETICULOCYTES
Immature Retic Fract: 35.4 % — ABNORMAL HIGH (ref 2.3–15.9)
RBC.: 2.53 MIL/uL — ABNORMAL LOW (ref 3.87–5.11)
Retic Count, Absolute: 49.3 10*3/uL (ref 19.0–186.0)
Retic Ct Pct: 2 % (ref 0.4–3.1)

## 2023-10-09 LAB — CBC WITH DIFFERENTIAL (CANCER CENTER ONLY)
Abs Immature Granulocytes: 0.06 10*3/uL (ref 0.00–0.07)
Basophils Absolute: 0 10*3/uL (ref 0.0–0.1)
Basophils Relative: 0 %
Eosinophils Absolute: 0 10*3/uL (ref 0.0–0.5)
Eosinophils Relative: 0 %
HCT: 29.5 % — ABNORMAL LOW (ref 36.0–46.0)
Hemoglobin: 10.1 g/dL — ABNORMAL LOW (ref 12.0–15.0)
Immature Granulocytes: 1 %
Lymphocytes Relative: 12 %
Lymphs Abs: 1 10*3/uL (ref 0.7–4.0)
MCH: 40.4 pg — ABNORMAL HIGH (ref 26.0–34.0)
MCHC: 34.2 g/dL (ref 30.0–36.0)
MCV: 118 fL — ABNORMAL HIGH (ref 80.0–100.0)
Monocytes Absolute: 0.9 10*3/uL (ref 0.1–1.0)
Monocytes Relative: 11 %
Neutro Abs: 6.7 10*3/uL (ref 1.7–7.7)
Neutrophils Relative %: 76 %
Platelet Count: 1217 10*3/uL (ref 150–400)
RBC: 2.5 MIL/uL — ABNORMAL LOW (ref 3.87–5.11)
RDW: 15.4 % (ref 11.5–15.5)
WBC Count: 8.7 10*3/uL (ref 4.0–10.5)
nRBC: 0.9 % — ABNORMAL HIGH (ref 0.0–0.2)

## 2023-10-09 LAB — CMP (CANCER CENTER ONLY)
ALT: 24 U/L (ref 0–44)
AST: 28 U/L (ref 15–41)
Albumin: 4.3 g/dL (ref 3.5–5.0)
Alkaline Phosphatase: 55 U/L (ref 38–126)
Anion gap: 6 (ref 5–15)
BUN: 29 mg/dL — ABNORMAL HIGH (ref 8–23)
CO2: 27 mmol/L (ref 22–32)
Calcium: 9.4 mg/dL (ref 8.9–10.3)
Chloride: 104 mmol/L (ref 98–111)
Creatinine: 0.96 mg/dL (ref 0.44–1.00)
GFR, Estimated: 58 mL/min — ABNORMAL LOW (ref >60)
Glucose, Bld: 96 mg/dL (ref 70–99)
Potassium: 3.6 mmol/L (ref 3.5–5.1)
Sodium: 137 mmol/L (ref 135–145)
Total Bilirubin: 0.3 mg/dL (ref 0.0–1.2)
Total Protein: 7.7 g/dL (ref 6.5–8.1)

## 2023-10-09 LAB — IRON AND IRON BINDING CAPACITY (CC-WL,HP ONLY)
Iron: 91 ug/dL (ref 28–170)
Saturation Ratios: 28 % (ref 10.4–31.8)
TIBC: 325 ug/dL (ref 250–450)
UIBC: 234 ug/dL (ref 148–442)

## 2023-10-09 LAB — FERRITIN: Ferritin: 739 ng/mL — ABNORMAL HIGH (ref 11–307)

## 2023-10-09 NOTE — Telephone Encounter (Signed)
 CRITICAL VALUE STICKER  CRITICAL VALUE: platelets [id number]  RECEIVER (on-site recipient of call):Beth Kamala Kolton, RN  DATE & TIME NOTIFIED: 10-09-23, 9!5 am  MESSENGER (representative from lab):Amber  MD NOTIFIED: Dr. Salomon Cree  TIME OF NOTIFICATION: 9:30 am  RESPONSE:  pt being seen today by Dr. Salomon Cree

## 2023-10-15 ENCOUNTER — Encounter: Payer: Self-pay | Admitting: Hematology

## 2023-10-28 ENCOUNTER — Encounter (HOSPITAL_COMMUNITY): Payer: Self-pay | Admitting: Emergency Medicine

## 2023-10-28 ENCOUNTER — Ambulatory Visit (HOSPITAL_COMMUNITY)
Admission: EM | Admit: 2023-10-28 | Discharge: 2023-10-28 | Disposition: A | Discharge status: Home / Self Care | Attending: Sports Medicine | Admitting: Sports Medicine

## 2023-10-28 ENCOUNTER — Ambulatory Visit (INDEPENDENT_AMBULATORY_CARE_PROVIDER_SITE_OTHER)

## 2023-10-28 DIAGNOSIS — M1612 Unilateral primary osteoarthritis, left hip: Secondary | ICD-10-CM | POA: Diagnosis not present

## 2023-10-28 DIAGNOSIS — M25552 Pain in left hip: Secondary | ICD-10-CM | POA: Diagnosis not present

## 2023-10-28 MED ORDER — METHYLPREDNISOLONE SODIUM SUCC 125 MG IJ SOLR
80.0000 mg | Freq: Once | INTRAMUSCULAR | Status: AC
  Administered 2023-10-28: 80 mg via INTRAMUSCULAR

## 2023-10-28 MED ORDER — KETOROLAC TROMETHAMINE 30 MG/ML IJ SOLN
60.0000 mg | Freq: Once | INTRAMUSCULAR | Status: AC
Start: 2023-10-28 — End: 2023-10-28
  Administered 2023-10-28: 60 mg via INTRAMUSCULAR

## 2023-10-28 MED ORDER — PREDNISONE 10 MG PO TABS: 40.0000 mg | ORAL_TABLET | Freq: Every day | ORAL | 0 refills | Status: DC

## 2023-10-28 MED ORDER — METHYLPREDNISOLONE SODIUM SUCC 125 MG IJ SOLR
INTRAMUSCULAR | Status: AC
  Filled 2023-10-28: qty 2

## 2023-10-28 MED ORDER — KETOROLAC TROMETHAMINE 60 MG/2ML IM SOLN
INTRAMUSCULAR | Status: AC
  Filled 2023-10-28: qty 2

## 2023-10-28 NOTE — ED Provider Notes (Signed)
 [facility]    CSN: [id number] Arrival date & time: 10/28/23  4098      History   Chief Complaint Chief Complaint  Patient presents with   Pain Management   Leg Pain    HPI [name] is a 83 y.o. female with history of rheumatoid arthritis on hydroxyurea  managed by rheumatology presents with acute on chronic left hip pain.  She denies any inciting injury.  She has been trying Tylenol  which states has not been helping.  Pain is severe and keeping her up at night.  She states she has previously had lateral hip injections with minimal improvement there is never had an ultrasound-guided left hip injection.  She thinks that this is secondary to arthritis.  She reports compliance with her medications.  Does ambulate with a cane.   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   CKD (chronic kidney disease) stage 3, GFR 30-59 ml/min (HCC) 10/01/2015   Anemia 05/10/2015   Uterine prolapse 01/08/2015   Glaucoma 09/19/2014   S/P shoulder replacement 02/16/2014   Mouth ulcers 05/24/2013   Rheumatoid arthritis (HCC) 09/23/2012   Diabetes mellitus (HCC) 01/13/2011   Hyperlipidemia 01/13/2011   Hypertension 01/13/2011   GE reflux 01/13/2011   Glaucoma 01/13/2011   Irritable bowel syndrome 01/13/2011   Hx pulmonary embolism 01/13/2011    Past Surgical History:  Procedure Laterality Date   ANTERIOR AND  POSTERIOR REPAIR WITH SACROSPINOUS FIXATION N/A 01/08/2015   Procedure: ANTERIOR AND POSTERIOR REPAIR WITH possible SACROSPINOUS FIXATION;  Surgeon: Woodrow Hazy, MD;  Location: [facility];  Service: Gynecology;  Laterality: N/A;   BACK SURGERY     CATARACT EXTRACTION W/ INTRAOCULAR LENS  IMPLANT, BILATERAL Bilateral    COLONOSCOPY     EYE SURGERY     LAPAROSCOPIC ASSISTED VAGINAL HYSTERECTOMY N/A 01/08/2015   Procedure: LAPAROSCOPIC ASSISTED VAGINAL HYSTERECTOMY;  Surgeon: Woodrow Hazy, MD;  Location: [facility];  Service: Gynecology;  Laterality: N/A;   LUMBAR FUSION  05/2010   REVERSE SHOULDER ARTHROPLASTY Left 02/16/2014   Procedure: LEFT REVERSE TOTAL SHOULDER ARTHROPLASTY;  Surgeon: Glo Larch, MD;  Location: [facility];  Service: Orthopedics;  Laterality: Left;   REVERSE SHOULDER ARTHROPLASTY Right 08/07/2016   REVERSE SHOULDER ARTHROPLASTY Right 08/07/2016   Procedure: REVERSE RIGHT SHOULDER ARTHROPLASTY;  Surgeon: Ellard Gunning, MD;  Location: [facility];  Service: Orthopedics;  Laterality: Right;   TOE SURGERY     TUBAL LIGATION      OB History   No obstetric history on file.      Home Medications    Prior to Admission medications   Medication Sig Start Date End Date Taking? Authorizing Provider  predniSONE  (DELTASONE ) 10 MG tablet Take 4 tablets (40 mg total) by mouth daily with breakfast. 10/29/23  Yes Kathy Wahid  D, MD  aspirin  81 MG tablet Take 81 mg by mouth daily.    [provider]  atorvastatin  (LIPITOR) 20 MG tablet TAKE 1 TABLET BY MOUTH DAILY 05/29/23   Sylvan Evener, MD  clotrimazole -betamethasone  (LOTRISONE ) cream Apply 1 application topically 2 (two) times daily. 11/04/18   Sylvan Evener, MD  hydroxyurea  (HYDREA ) 500 MG capsule TAKE 3 CAPSULES BY MOUTH MONDAYS TUESDAY THURSDAY AND FRIDAYS AND 2 CAPSULES BY MOUTH ON WEDNESDAY SATURDAY SUNDAYS TAKE WITH FOOD 08/04/23   Frankie Israel, MD  lansoprazole  (PREVACID ) 30 MG capsule Take 1 capsule (30 mg total) by  mouth daily at 12 noon. Patient not taking: Reported on 09/04/2023 11/20/21   Kale, Gautam Kishore, MD  mupirocin  ointment (BACTROBAN ) 2 % APPLY TOPICALLY TO THE AFFECTED AREA TWICE DAILY AS NEEDED Patient not taking: Reported on 09/04/2023 12/15/22   Sylvan Evener, MD  NEOMYCIN -POLYMYXIN-HYDROCORTISONE (CORTISPORIN) 1 % SOLN OTIC solution Place 3 drops into the left ear 4 (four) times daily. Patient not taking: Reported on 09/04/2023 07/23/23   Sylvan Evener, MD  polyethylene glycol powder (GLYCOLAX /MIRALAX ) powder MIX 1 CAPFUL IN 8 OUNCES OF WATER AND DRINK ONCE DAILY AS DIRECTED Patient not taking: Reported on 09/04/2023 09/21/17   Sylvan Evener, MD    Family History Family History  Problem Relation Age of Onset   Healthy Daughter     Social History Social History   Tobacco Use   Smoking status: Never   Smokeless tobacco: Never  Substance Use Topics   Alcohol use: No   Drug use: No     Allergies   Alendronate, Allopurinol, Amoxicillin-pot clavulanate, Folic acid , Methotrexate derivatives, Latex, and Tramadol    Review of Systems Review of Systems   Physical Exam  Updated Vital Signs BP (!) 111/56 (BP Location: Left Arm)   Pulse 81   Temp 97.6 F (36.4 C) (Oral)   Resp 18   SpO2 97%   Physical Exam Constitutional:      General: She is in acute distress.     Appearance: Normal appearance. She is normal weight. She is not ill-appearing, toxic-appearing or diaphoretic.  HENT:     Head: Normocephalic and atraumatic.  Musculoskeletal:     Cervical back: Normal range of motion and neck supple.     Comments: Left hip MSK exam: No deformity. Range of motion significantly limited by pain.  She can flex roughly 30 degrees before severe pain, internally rotate 10 degrees, externally rotate 20 degrees.  She has weakness with hip flexion and abduction though this exam is limited by her pain. She is tenderness to palpation over the anterior hip joint and mildly over the lateral  aspect of the hip. Neurovascularly intact distally. Positive logroll Unable to tolerate faber, fadir, and piriformis stretches.   Neurological:     Mental Status: She is alert.      UC Treatments / Results  Labs (all labs ordered are listed, but only abnormal results are displayed) Labs Reviewed - No data to display  EKG   Radiology DG Hip Unilat With Pelvis 2-3 Views Left Result Date: 10/28/2023 CLINICAL DATA:  Left hip pain EXAM: DG HIP (WITH OR WITHOUT PELVIS) 2-3V LEFT COMPARISON:  None Available. FINDINGS: Advanced osteoarthrosis with narrowing of the superolateral compartment of the joint space. Osteoarthrosis of the right hip with narrowing of the joint space Fracture deformity of the right superior pubic ramus. Correlate clinically. Osteoarthrosis of both sacroiliac joints right more than left with postsurgical changes of  the L4-5 region. IMPRESSION: *Advanced osteoarthrosis of the left hip. *Fracture deformity of the right superior pubic ramus. Electronically Signed   By: Fredrich Jefferson M.D.   On: 10/28/2023 09:22    Procedures Procedures (including critical care time)  Medications Ordered in UC Medications  ketorolac  (TORADOL ) 30 MG/ML injection 60 mg (60 mg Intramuscular Given 10/28/23 0938)  methylPREDNISolone  sodium succinate (SOLU-MEDROL ) 125 mg/2 mL injection 80 mg (80 mg Intramuscular Given 10/28/23 0934)    Initial Impression / Assessment and Plan / UC Course  I have reviewed the triage vital signs and the nursing notes.  Pertinent labs & imaging results that were available during my care of the patient were reviewed by me and considered in my medical decision making (see chart for details).    Vitals and triage reviewed, patient is hemodynamically stable.    Left hip pain - Plan: DG Hip Unilat With Pelvis 2-3 Views Left, DG Hip Unilat With Pelvis 2-3 Views Left Her exam is localizing pain to the intra-articular aspect of the left hip.  Given her significant  reduction in range of motion I do think is prudent we get imaging to confirm no acute bony trauma or fractures though she does not have any recent falls or trauma to suggest such.  Review of her left hip x-rays showed end-stage left hip arthritis, though no acute bony abnormalities on my read. On radiology read there is comment of fracture deformity of the right superior pubic ramus, though she is not tender on the right hip or with right hip ROM. I do not see this fracture on her x-rays and don't find this to be clinically significant in her current presentation.  Recommended follow-up with sports medicine for consideration of ultrasound-guided left hip injection next week if pain fails to improve. She was given IM inj of Depomedrol and Toradol  in office today as well as a 5 day burst of prednisone  and instructions to continue tylenol .  Return precautions discussed Patient's questions were answered and they are in agreement with this plan  Final Clinical Impressions(s) / UC Diagnoses   Final diagnoses:  Left hip pain     Discharge Instructions      You have severe arthritis in the left hip. You received two anti-inflammatory injections in the bottom today to help with your pain and I have also sent you 5 days of prednisone  to help with pain and inflammation starting tomorrow. Continue the tylenol  at 650-1000mg  every 8 hours.  I have also sent a referral over to the [facility] for you to follow-up with next week for consideration of a hip injection if pain is not improved. [facility] Address: [address] [place], [place], [place] [address] Phone: [phone]   ED Prescriptions     Medication Sig Dispense Auth. Provider   predniSONE  (DELTASONE ) 10 MG tablet Take 4 tablets (40 mg total) by mouth daily with breakfast. 20 tablet Bekki Tavenner D, MD      PDMP not reviewed this encounter.   Jimmy Plessinger D, MD 10/28/23 [phone]

## 2023-10-28 NOTE — Discharge Instructions (Addendum)
 You have severe arthritis in the left hip. You received two anti-inflammatory injections in the bottom today to help with your pain and I have also sent you 5 days of prednisone  to help with pain and inflammation starting tomorrow. Continue the tylenol  at 650-1000mg  every 8 hours.  I have also sent a referral over to the [facility] for you to follow-up with next week for consideration of a hip injection if pain is not improved. [facility] Address: [address] [place], [place], [place] [address] Phone: [phone]

## 2023-10-28 NOTE — ED Triage Notes (Signed)
 Pt reports that she has bad arthritis and her PCP sent her to a pain specialist but waiting to get into be established with them. Reports pain is very bad. Taking tylenol  and not helping. Reports pain is really bad at night and can't sleep.

## 2023-10-30 ENCOUNTER — Telehealth: Payer: Self-pay

## 2023-10-30 NOTE — Telephone Encounter (Signed)
 Copied from CRM [id number]. Topic: Clinical - Medical Advice >> Oct 30, 2023  9:11 AM [name] wrote: Reason for CRM: Patient called. States she wanted to leave message for provider. She was referred to pain clinic but they could not get her in and she starting hurting really bad so she went to urgent care. They told her that she will have to have hip surgery. States they schedule her for Wednesday for an injection but if that does not work she will need surgery and she wanted provider to know. Thank You

## 2023-11-04 ENCOUNTER — Other Ambulatory Visit: Payer: Self-pay

## 2023-11-04 ENCOUNTER — Ambulatory Visit: Admitting: Family Medicine

## 2023-11-04 VITALS — BP 169/68 | Wt 141.0 lb

## 2023-11-04 DIAGNOSIS — M25552 Pain in left hip: Secondary | ICD-10-CM | POA: Diagnosis not present

## 2023-11-04 MED ORDER — METHYLPREDNISOLONE ACETATE 80 MG/ML IJ SUSP
80.0000 mg | Freq: Once | INTRAMUSCULAR | Status: AC
  Administered 2023-11-04: 80 mg via INTRA_ARTICULAR

## 2023-11-04 NOTE — Patient Instructions (Signed)
 Your hip pain is due to arthritis.  You were given a steroid injection into this hip today.  If not improving as we'd hope next step would be to refer you to a surgeon to discuss replacement if you're a candidate. Take tylenol  500mg  1-2 tabs three times a day for pain as needed. Follow up with us  in 1 month but call sooner if you're struggling.

## 2023-11-05 NOTE — Progress Notes (Signed)
 PCP: Sylvan Evener, MD  Subjective:   HPI: Patient is a 83 y.o. female here for left hip pain.  Patient reports at least 2-3 weeks of very severe pain anterior left hip. She's had problems with this hip before and given trochanteric bursa injection without much relief. She recently went to urgent care for this and was given IM toradol  and depomedrol. Her x-rays were independently reviewed by me today and showed severe hip arthritis. 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  Past Medical History:  Diagnosis Date   Arthritis    Cervical stenosis of spine    Dysphasia    Essential hypertension, benign    GERD (gastroesophageal reflux disease)    Glaucoma    Heart murmur    History of estrogen therapy    Hypercholesteremia    IBS (irritable bowel syndrome)    Mitral regurgitation    Pulmonary air embolism (HCC)    states she had this wiht her back surgery   Rheumatoid arthritis (HCC)    Seborrhea    patient denies    Current Outpatient Medications on File Prior to Visit  Medication Sig Dispense Refill   aspirin  81 MG tablet Take 81 mg by mouth daily.     atorvastatin  (LIPITOR) 20 MG tablet TAKE 1 TABLET BY MOUTH DAILY 100 tablet 2   clotrimazole -betamethasone  (LOTRISONE ) cream Apply 1 application topically 2 (two) times daily. 45 g 2   hydroxyurea  (HYDREA ) 500 MG capsule TAKE 3 CAPSULES BY MOUTH MONDAYS TUESDAY THURSDAY AND FRIDAYS AND 2 CAPSULES BY MOUTH ON WEDNESDAY SATURDAY SUNDAYS TAKE WITH FOOD 90 capsule 4   lansoprazole  (PREVACID ) 30 MG capsule Take 1 capsule (30 mg total) by mouth daily at 12 noon. (Patient not taking: Reported on 09/04/2023) 30 capsule    mupirocin  ointment (BACTROBAN ) 2 % APPLY TOPICALLY TO THE AFFECTED AREA TWICE DAILY AS NEEDED (Patient not taking: Reported on 09/04/2023) 22 g 11   NEOMYCIN -POLYMYXIN-HYDROCORTISONE (CORTISPORIN) 1 % SOLN OTIC solution Place 3 drops into the left ear 4 (four) times daily. (Patient not taking:  Reported on 09/04/2023) 10 mL 0   polyethylene glycol powder (GLYCOLAX /MIRALAX ) powder MIX 1 CAPFUL IN 8 OUNCES OF WATER AND DRINK ONCE DAILY AS DIRECTED (Patient not taking: Reported on 09/04/2023) 527 g 0   predniSONE  (DELTASONE ) 10 MG tablet Take 4 tablets (40 mg total) by mouth daily with breakfast. 20 tablet 0   No current facility-administered medications on file prior to visit.    Past Surgical History:  Procedure Laterality Date   ANTERIOR AND POSTERIOR REPAIR WITH SACROSPINOUS FIXATION N/A 01/08/2015   Procedure: ANTERIOR AND POSTERIOR REPAIR WITH possible SACROSPINOUS FIXATION;  Surgeon: Woodrow Hazy, MD;  Location: [facility];  Service: Gynecology;  Laterality: N/A;   BACK SURGERY     CATARACT EXTRACTION W/ INTRAOCULAR LENS  IMPLANT, BILATERAL Bilateral    COLONOSCOPY     EYE SURGERY     LAPAROSCOPIC ASSISTED VAGINAL HYSTERECTOMY N/A 01/08/2015   Procedure: LAPAROSCOPIC ASSISTED VAGINAL HYSTERECTOMY;  Surgeon: Woodrow Hazy, MD;  Location: [facility];  Service: Gynecology;  Laterality: N/A;   LUMBAR FUSION  05/2010   REVERSE SHOULDER ARTHROPLASTY Left 02/16/2014   Procedure: LEFT REVERSE TOTAL SHOULDER ARTHROPLASTY;  Surgeon: Glo Larch, MD;  Location: [facility];  Service: Orthopedics;  Laterality: Left;   REVERSE SHOULDER ARTHROPLASTY Right 08/07/2016   REVERSE SHOULDER ARTHROPLASTY Right 08/07/2016   Procedure: REVERSE RIGHT SHOULDER ARTHROPLASTY;  Surgeon: Ellard Gunning, MD;  Location: [facility];  Service: Orthopedics;  Laterality: Right;   TOE SURGERY     TUBAL LIGATION      Allergies  Allergen Reactions   Alendronate    Allopurinol Swelling   Amoxicillin-Pot Clavulanate Diarrhea and Itching    PATIENT DOES NOT RECALL ALLERGIC REACTION TO AUGMENTIN.  Has patient had a PCN reaction causing immediate rash, facial/tongue/throat swelling, SOB or lightheadedness with hypotension: UNKNOWN Has patient had a PCN reaction causing severe rash involving mucus membranes or skin necrosis:  UNKNOWN Has patient had a PCN reaction that required hospitalization No Has patient had a PCN reaction occurring within the last 10 years: No If all of the above answers are NO, then may proceed    Folic Acid     Methotrexate Derivatives    Latex Itching    Latex gloves    Tramadol  Nausea And Vomiting    BP (!) 169/68 (BP Location: Left Arm, Patient Position: Sitting, Cuff Size: Normal)   Wt 141 lb (64 kg)   BMI 28.00 kg/m       No data to display              No data to display              Objective:  Physical Exam:  Gen: NAD, comfortable in exam room  Left hip: No deformity. Very limited ROM with pain on passive motions. No tenderness to palpation greater trochanter. Neurovascularly intact distally. Positive logroll Positive fadir.   Assessment & Plan:  1. Left hip pain - due to severe osteoarthritis.  Unfortunately it's unlikely she is a surgical candidate at this time.  We discussed risks/benefits and went ahead with intraarticular hip injection today.  Can consider ortho referral, discussion with her hematologist about candidacy for replacement if not improving.  Tylenol  for pain.  Ice/heat if needed.  Follow up in 1 month.  After informed written consent timeout was performed, patient was lying supine on exam table.  Area overlying left hip joint was prepped with alcohol swabs then utilizing ultrasound guidance, patient's left hip was injected with 4:1 lidocaine : depomedrol 80mg .  Patient tolerated procedure well without immediate complications.

## 2023-11-13 ENCOUNTER — Other Ambulatory Visit: Payer: Self-pay

## 2023-11-13 ENCOUNTER — Encounter (HOSPITAL_COMMUNITY): Payer: Self-pay | Admitting: Emergency Medicine

## 2023-11-13 ENCOUNTER — Emergency Department (HOSPITAL_COMMUNITY)
Admission: EM | Admit: 2023-11-13 | Discharge: 2023-11-13 | Disposition: A | Discharge status: Home / Self Care | Attending: Emergency Medicine | Admitting: Emergency Medicine

## 2023-11-13 DIAGNOSIS — Z7982 Long term (current) use of aspirin: Secondary | ICD-10-CM | POA: Diagnosis not present

## 2023-11-13 DIAGNOSIS — Z91040 Latex allergy status: Secondary | ICD-10-CM | POA: Diagnosis not present

## 2023-11-13 DIAGNOSIS — M25552 Pain in left hip: Secondary | ICD-10-CM

## 2023-11-13 DIAGNOSIS — R262 Difficulty in walking, not elsewhere classified: Secondary | ICD-10-CM | POA: Diagnosis not present

## 2023-11-13 MED ORDER — HYDROCODONE-ACETAMINOPHEN 5-325 MG PO TABS: 1.0000 | ORAL_TABLET | ORAL | 0 refills | Status: DC | PRN

## 2023-11-13 MED ORDER — METHYLPREDNISOLONE SODIUM SUCC 125 MG IJ SOLR
125.0000 mg | Freq: Once | INTRAMUSCULAR | Status: AC
  Administered 2023-11-13: 125 mg via INTRAMUSCULAR
  Filled 2023-11-13: qty 2

## 2023-11-13 MED ORDER — KETOROLAC TROMETHAMINE 30 MG/ML IJ SOLN
30.0000 mg | Freq: Once | INTRAMUSCULAR | Status: AC
  Administered 2023-11-13: 30 mg via INTRAVENOUS
  Filled 2023-11-13: qty 1

## 2023-11-13 NOTE — Discharge Instructions (Addendum)
 Call your Physician to be seen for pain management and referral to pain mangement

## 2023-11-13 NOTE — ED Notes (Signed)
 Pt wheeled to lobby and waiting for daughter to be transported home.

## 2023-11-13 NOTE — ED Triage Notes (Signed)
 Pt to ER via EMS form home with c/o left  hip pain related to arthritis.  Pt has significant hx of same but is unable to have surgery due to platelet counts.  Pt had steroid injection on 6/11 with minimal relief.  Pt is only taking Tylenol  at home for pain.  No new injury noted.

## 2023-11-13 NOTE — ED Provider Notes (Cosign Needed)
 [name] Provider Note   CSN: [id number] Arrival date & time: 11/13/23  0900     Patient presents with: Hip Pain   [name] is a 83 y.o. female.   Patient complains of left hip pain.  Patient has been seen by sports medicine and given an injection in her hip.  Patient also had a steroid injection given at urgent care.  Patient states the pain is bad today she has reached out to sports medicine for help with the pain but they were unable to prescribe her anything.  Patient is here for assistance with severe pain.  Patient states she has rheumatoid arthritis and she also has bad arthritis in her hip.  Patient is supposed to follow-up with orthopedist for possible surgery but she has thrombocytosis and a history of a pulmonary embolus.  The history is provided by the patient. No language interpreter was used.  Hip Pain       Prior to Admission medications   Medication Sig Start Date End Date Taking? Authorizing Provider  HYDROcodone -acetaminophen  (NORCO/VICODIN) 5-325 MG tablet Take 1 tablet by mouth every 4 (four) hours as needed. 11/13/23 11/12/24 Yes Sandi Crosby, PA-C  aspirin  81 MG tablet Take 81 mg by mouth daily.    [provider]  atorvastatin  (LIPITOR) 20 MG tablet TAKE 1 TABLET BY MOUTH DAILY 05/29/23   Sylvan Evener, MD  clotrimazole -betamethasone  (LOTRISONE ) cream Apply 1 application topically 2 (two) times daily. 11/04/18   Sylvan Evener, MD  hydroxyurea  (HYDREA ) 500 MG capsule TAKE 3 CAPSULES BY MOUTH MONDAYS TUESDAY THURSDAY AND FRIDAYS AND 2 CAPSULES BY MOUTH ON WEDNESDAY SATURDAY SUNDAYS TAKE WITH FOOD 08/04/23   Frankie Israel, MD  lansoprazole  (PREVACID ) 30 MG capsule Take 1 capsule (30 mg total) by mouth daily at 12 noon. Patient not taking: Reported on 09/04/2023 11/20/21   Kale, Gautam Kishore, MD  mupirocin  ointment (BACTROBAN ) 2 % APPLY TOPICALLY TO THE AFFECTED AREA TWICE DAILY AS NEEDED Patient  not taking: Reported on 09/04/2023 12/15/22   Sylvan Evener, MD  NEOMYCIN -POLYMYXIN-HYDROCORTISONE (CORTISPORIN) 1 % SOLN OTIC solution Place 3 drops into the left ear 4 (four) times daily. Patient not taking: Reported on 09/04/2023 07/23/23   Sylvan Evener, MD  polyethylene glycol powder (GLYCOLAX /MIRALAX ) powder MIX 1 CAPFUL IN 8 OUNCES OF WATER AND DRINK ONCE DAILY AS DIRECTED Patient not taking: Reported on 09/04/2023 09/21/17   Sylvan Evener, MD  predniSONE  (DELTASONE ) 10 MG tablet Take 4 tablets (40 mg total) by mouth daily with breakfast. 10/29/23   Currieo, Andrew D, MD    Allergies: Alendronate, Allopurinol, Amoxicillin-pot clavulanate, Folic acid , Methotrexate derivatives, Latex, and Tramadol     Review of Systems  All other systems reviewed and are negative.   Updated Vital Signs BP (!) 160/77 (BP Location: Right Arm)   Pulse 77   Temp 98.2 F (36.8 C)   Resp 20   Ht 4' 11.5 (1.511 m)   Wt 62.1 kg   SpO2 98%   BMI 27.21 kg/m   Physical Exam Vitals and nursing note reviewed.  Constitutional:      Appearance: She is well-developed.  HENT:     Head: Normocephalic.  Pulmonary:     Effort: Pulmonary effort is normal.  Abdominal:     General: There is no distension.   Musculoskeletal:        General: Tenderness present. No swelling.     Cervical back: Normal range  of motion.     Comments: Pain with movement left hip, no deformity, neurovascular neurosensory intact   Skin:    General: Skin is warm.   Neurological:     General: No focal deficit present.     Mental Status: She is alert and oriented to person, place, and time.   Psychiatric:        Mood and Affect: Mood normal.     (all labs ordered are listed, but only abnormal results are displayed) Labs Reviewed - No data to display  EKG: None  Radiology: No results found.   Procedures   Medications Ordered in the ED  ketorolac  (TORADOL ) 30 MG/ML injection 30 mg (30 mg Intravenous Given 11/13/23 1014)   methylPREDNISolone  sodium succinate (SOLU-MEDROL ) 125 mg/2 mL injection 125 mg (125 mg Intramuscular Given 11/13/23 1014)                                    Medical Decision Making Patient complains of pain in her hip not relieved by home medications.  Amount and/or Complexity of Data Reviewed Independent Historian: caregiver    Details: Patient is here with her daughter who she lives with  Risk Prescription drug management. Risk Details: Patient is given Solu-Medrol  and Toradol  IM.  Patient is given a prescription for hydrocodone .  Patient is advised to call her primary care physician to be seen until she is evaluated by orthopedist or referred to pain management.        Final diagnoses:  Left hip pain    ED Discharge Orders          Ordered    HYDROcodone -acetaminophen  (NORCO/VICODIN) 5-325 MG tablet  Every 4 hours PRN        11/13/23 1148            An After Visit Summary was printed and given to the patient.    Sandi Crosby, PA-C 11/13/23 1524

## 2023-11-17 ENCOUNTER — Ambulatory Visit: Payer: Self-pay

## 2023-11-17 ENCOUNTER — Ambulatory Visit: Admitting: Internal Medicine

## 2023-11-17 VITALS — BP 152/76 | HR 82 | Temp 98.0°F | Ht 59.5 in | Wt 101.8 lb

## 2023-11-17 DIAGNOSIS — M7918 Myalgia, other site: Secondary | ICD-10-CM

## 2023-11-17 DIAGNOSIS — D473 Essential (hemorrhagic) thrombocythemia: Secondary | ICD-10-CM | POA: Diagnosis not present

## 2023-11-17 DIAGNOSIS — M059 Rheumatoid arthritis with rheumatoid factor, unspecified: Secondary | ICD-10-CM | POA: Diagnosis not present

## 2023-11-17 DIAGNOSIS — R7302 Impaired glucose tolerance (oral): Secondary | ICD-10-CM

## 2023-11-17 DIAGNOSIS — M1612 Unilateral primary osteoarthritis, left hip: Secondary | ICD-10-CM

## 2023-11-17 MED ORDER — OXYCODONE-ACETAMINOPHEN 5-325 MG PO TABS: 1.0000 | ORAL_TABLET | Freq: Three times a day (TID) | ORAL | 0 refills | Status: AC | PRN

## 2023-11-17 MED ORDER — METHYLPREDNISOLONE ACETATE 80 MG/ML IJ SUSP
80.0000 mg | Freq: Once | INTRAMUSCULAR | Status: AC
  Administered 2023-11-17: 80 mg via INTRAMUSCULAR

## 2023-11-17 NOTE — Progress Notes (Signed)
 Patient Care Team: Perri Ronal PARAS, MD as PCP - Diedre Rosalie Kitchens, MD as Consulting Physician (Gastroenterology) Leni Marjory MATSU, MD (Rheumatology)  Visit Date: 11/17/23  Subjective:   Chief Complaint  Patient presents with   Hip Pain    Noticed about a month ago. Nothing is helping with pain. Pain scale 10.    Vitals:   11/17/23 1535  BP: (!) 152/76   Patient PI:[name],Female DOB:02-21-1941,83 y.o. FMW:[id number]   83 y.o.Female, ambulating with a walker, presents today for ED follow-up from 11/13/2023 for Left Hip Pain. Patient has a past medical history of Rheumatoid Arthritis treated with Hydroxyurea  1500 mg 4 days/ week and 1000 mg 3 days/ week. Followed by Dr. Paulita, Rheumatologist; Gout; Essential Thrombocytosis followed by Dr. Onesimo, Oncologist with Platelets 1,217 on 10/09/2023. On 6/04 she was seen in [facility] for left hip pain. Had a hip xray showing end-stage arthritis of the left hip. Given Depo-Medrol  80 mg IM and Toradol  in-office and prescribed Prednisone  x5 days with instructions to continue Tylenol  [phone] mg every 8 hours. On 6/11 evaluated by Dr. Cleatrice with Sports Medicine who did an intraarticular hip injection. He did not think she would be a surgical candidate, but told her to discuss with Dr. Onesimo if pain does not improve. On 6/20 she was seen in ED by Dr. Zackowski, was given Solu-Medrol  and Toradol  IM and prescribed Vicodin. Today she says that she is still in severe pain, rated at 10/10, despite the various interventions tried and due to this pain she hasn't been sleeping very well.   Past Medical History:  Diagnosis Date   Arthritis    Cervical stenosis of spine    Dysphasia    Essential hypertension, benign    GERD (gastroesophageal reflux disease)    Glaucoma    Heart murmur    History of estrogen therapy    Hypercholesteremia    IBS (irritable bowel syndrome)    Mitral regurgitation    Pulmonary air embolism (HCC)    states she had  this wiht her back surgery   Rheumatoid arthritis (HCC)    Seborrhea    patient denies    Allergies  Allergen Reactions   Alendronate    Allopurinol Swelling   Amoxicillin-Pot Clavulanate Diarrhea and Itching    PATIENT DOES NOT RECALL ALLERGIC REACTION TO AUGMENTIN.  Has patient had a PCN reaction causing immediate rash, facial/tongue/throat swelling, SOB or lightheadedness with hypotension: UNKNOWN Has patient had a PCN reaction causing severe rash involving mucus membranes or skin necrosis: UNKNOWN Has patient had a PCN reaction that required hospitalization No Has patient had a PCN reaction occurring within the last 10 years: No If all of the above answers are NO, then may proceed    Folic Acid     Methotrexate Derivatives    Latex Itching    Latex gloves    Tramadol  Nausea And Vomiting    Past Surgical History:  Procedure Laterality Date   ANTERIOR AND POSTERIOR REPAIR WITH SACROSPINOUS FIXATION N/A 01/08/2015   Procedure: ANTERIOR AND POSTERIOR REPAIR WITH possible SACROSPINOUS FIXATION;  Surgeon: Charlie Croak, MD;  Location: [facility];  Service: Gynecology;  Laterality: N/A;   BACK SURGERY     CATARACT EXTRACTION W/ INTRAOCULAR LENS  IMPLANT, BILATERAL Bilateral    COLONOSCOPY     EYE SURGERY     LAPAROSCOPIC ASSISTED VAGINAL HYSTERECTOMY N/A 01/08/2015   Procedure: LAPAROSCOPIC ASSISTED VAGINAL HYSTERECTOMY;  Surgeon: Charlie Croak, MD;  Location: [facility];  Service: Gynecology;  Laterality: N/A;   LUMBAR FUSION  05/2010   REVERSE SHOULDER ARTHROPLASTY Left 02/16/2014   Procedure: LEFT REVERSE TOTAL SHOULDER ARTHROPLASTY;  Surgeon: Franky CHRISTELLA Pointer, MD;  Location: [facility];  Service: Orthopedics;  Laterality: Left;   REVERSE SHOULDER ARTHROPLASTY Right 08/07/2016   REVERSE SHOULDER ARTHROPLASTY Right 08/07/2016   Procedure: REVERSE RIGHT SHOULDER ARTHROPLASTY;  Surgeon: Franky Pointer, MD;  Location: [facility];  Service: Orthopedics;  Laterality: Right;   TOE SURGERY     TUBAL  LIGATION     Family History  Problem Relation Age of Onset   Healthy Daughter    Social History   Social History Narrative   Lives with daughter   Caffeine use: none      Social history: She is retired.  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  Does not smoke or consume alcohol.  She is a widow.       Family history: Mother died of an MI with history of hypertension who was 85 years of age at the time of her event.  1 brother with history of adult onset diabetes, 5 sisters-one of them has diabetes.  1 daughter died of an occult GI malignancy.  1 brother in good health.  She has 9 children.  1 daughter with history of pulmonary sarcoidosis years ago that resolved.   Review of Systems  Musculoskeletal:  Positive for joint pain (shooting pain, Left Hip, rates 10/10).     Objective:  Vitals:  Today's Vitals   11/17/23 1535  BP: (!) 152/76  Pulse: 82  Temp: 98 F (36.7 C)  TempSrc: Temporal  SpO2: 99%  Weight: 101 lb 12.8 oz (46.2 kg)  Height: 4' 11.5 (1.511 m)  PainSc: 10-Worst pain ever  PainLoc: Hip   Physical Exam Vitals and nursing note reviewed.  Constitutional:      General: She is in acute distress.     Appearance: Normal appearance. She is not toxic-appearing.  HENT:     Head: Normocephalic and atraumatic.  Pulmonary:     Effort: Pulmonary effort is normal.   Skin:    General: Skin is warm and dry.   Neurological:     Mental Status: She is alert and oriented to person, place, and time. Mental status is at baseline.   Psychiatric:        Mood and Affect: Affect is tearful.        Thought Content: Thought content normal.        Judgment: Judgment normal.    Results:  Studies Obtained And Personally Reviewed By Me:  DG HIP (WITH OR WITHOUT PELVIS) 2-3V LEFT  10/28/2023   CLINICAL DATA:  Left hip pain   EXAM:  COMPARISON:  None Available.   FINDINGS: Advanced osteoarthritis with  narrowing of the superolateral compartment of the joint space.   Needs hip left arthroplasty. Will ask Dr. Onesimo if she can undergo hip arthroplasty. Patient is on Hydrea  for thrombocytosis. Question will be perioperative and post op management to prevent thrombosis.   Currently having a lot of hip pain . Is tearful in the office today. Have provided 5 day supply of Oxycodone  5/325 and will make referral to [facility] management clinic. Have also made referral to Dr. Vernetta at [facility]. She does not want to return to [facility].   Osteoarthrosis of the right hip with narrowing of the joint space   Fracture deformity of the  right superior pubic ramus.  Has hx chronic musculoskeletal pain. Hx of Rheumatoid arthirits Note: followed by Dr. Leni, Rheumatologist. Intolerant of methotrexate due to mouth sores despite taking folic acid . Tried Leflunoimide but could not tolerate it.     Osteoarthrosis of both sacroiliac joints right more than left with postsurgical changes of the L4-5 region.   IMPRESSION: *Advanced osteoarthrosis of the left hip.  *Fracture deformity of the right superior pubic ramus.    Labs:     Component Value Date/Time   NA 137 10/09/2023 0900   NA 143 07/15/2022 0000   K 3.6 10/09/2023 0900   CL 104 10/09/2023 0900   CO2 27 10/09/2023 0900   GLUCOSE 96 10/09/2023 0900   BUN 29 (H) 10/09/2023 0900   BUN 22 (A) 07/15/2022 0000   CREATININE 0.96 10/09/2023 0900   CREATININE 0.90 09/01/2023 0928   CALCIUM  9.4 10/09/2023 0900   PROT 7.7 10/09/2023 0900   ALBUMIN  4.3 10/09/2023 0900   AST 28 10/09/2023 0900   ALT 24 10/09/2023 0900   ALKPHOS 55 10/09/2023 0900   BILITOT 0.3 10/09/2023 0900   GFRNONAA 58 (L) 10/09/2023 0900   GFRNONAA 59 (L) 06/22/2020 0907   GFRAA 69 06/22/2020 0907    Lab Results  Component Value Date   WBC 8.7 10/09/2023   HGB 10.1 (L) 10/09/2023   HCT 29.5 (L) 10/09/2023   MCV 118.0 (H) 10/09/2023   PLT 1,217 (HH) 10/09/2023    Lab Results  Component Value Date   CHOL 172 09/01/2023   HDL 62 09/01/2023   LDLCALC 93 09/01/2023   TRIG 84 09/01/2023   CHOLHDL 2.8 09/01/2023   Lab Results  Component Value Date   HGBA1C 5.9 (H) 09/01/2023    Lab Results  Component Value Date   TSH 2.73 02/27/2023   Assessment & Plan:   ED follow-up from 11/13/2023 for Left Hip Pain: seen by Urgent Care, Sports Medicine, and in the ED, given multiple interventions which have not relieved her pain at all.   Issue is whether she is a candidate for surgery due to her thrombocytosis, but she has not yet discussed this with Dr. Onesimo, Oncologist. Have copied Dr. Onesimo on this note.  Today she says that she is still in severe pain, rated at 10/10, despite the various interventions tried and due to this pain she hasn't been sleeping very well. Send in Oxycodone  5/325 (5 Day supply #15 tabs) to take sparingly and made referral to [facility] clinic and to Dr.  Medford Poli, Orthopedist to assess hip pain.  Rheumatoid Arthritis Followed by Dr. Leni, Rheumatologist at [facility]. Was on Prednisone  taper in May 2025.  History of Gout- no recent episodes  Hx of right carpal tunnel syndrome- has not seen hand specialist  Essential Thrombocytosis followed by Dr. Onesimo, Oncologist with Platelets 1,217 on 10/09/2023. Ask Dr. Onesimo how to manage if pt needs hip arthroplasty. Currently on Hydrea .She does also take ASA 81 mg aspirin  daily and Hydrea .  GERD treated with PPI  Hyperlipidemia treated with Atorvastatin      I,Emily Lagle,acting as a scribe for Ronal JINNY Hailstone, MD.,have documented all relevant documentation on the behalf of Ronal JINNY Hailstone, MD,as directed by  Ronal JINNY Hailstone, MD while in the presence of Ronal JINNY Hailstone, MD.   I, Ronal JINNY Hailstone, MD, have reviewed all documentation for this visit. The documentation on 11/22/23 for the exam, diagnosis, procedures, and orders are all accurate and complete.

## 2023-11-17 NOTE — Telephone Encounter (Signed)
 FYI Only or Action Required?: Action required by provider: request for appointment.  Patient was last seen in primary care on 09/04/2023 by Perri Ronal PARAS, MD. Called Nurse Triage reporting Hip Pain. Symptoms began about a month ago. Interventions attempted: Ice/heat application. Symptoms are: gradually worsening.  Triage Disposition: See HCP Within 4 Hours (Or PCP Triage)  Patient/caregiver understands and will follow disposition?: Yes, will follow disposition  Copied from CRM [id number]. Topic: Clinical - Red Word Triage >> Nov 17, 2023  9:11 AM Larissa RAMAN wrote: [facility] that prompted transfer to Nurse Triage: Lt hip pain, difficulty walking   ----------------------------------------------------------------------- From previous Reason for Contact - Scheduling: Patient/patient representative is calling to schedule an appointment. Refer to attachments for appointment information. Reason for Disposition  [1] SEVERE pain (e.g., excruciating, unable to do any normal activities) AND [2] not improved after 2 hours of pain medicine  Answer Assessment - Initial Assessment Questions 1. LOCATION and RADIATION: Where is the pain located?      L hip 3. SEVERITY: How bad is the pain? What does it keep you from doing?   (Scale 1-10; or mild, moderate, severe)   -  MILD (1-3): doesn't interfere with normal activities    -  MODERATE (4-7): interferes with normal activities (e.g., work or school) or awakens from sleep, limping    -  SEVERE (8-10): excruciating pain, unable to do any normal activities, unable to walk     severe 4. ONSET: When did the pain start? Does it come and go, or is it there all the time?     About a month 5. WORK OR EXERCISE: Has there been any recent work or exercise that involved this part of the body?      denies 6. CAUSE: What do you think is causing the hip pain?      Was told at [facility] that it is bone on bone 7. AGGRAVATING FACTORS: What makes the hip pain worse?  (e.g., walking, climbing stairs, running)     Walking makes worse, use heating pad 8. OTHER SYMPTOMS: Do you have any other symptoms? (e.g., back pain, pain shooting down leg,  fever, rash)     Going down leg  Pt has been seen in ED and UC for this pain, has been told to f/u with PCP. Routing to clinic for scheduling.  Protocols used: Hip Pain-A-AH

## 2023-11-19 ENCOUNTER — Telehealth: Payer: Self-pay

## 2023-11-19 NOTE — Telephone Encounter (Signed)
 Copied from CRM [id number]. Topic: General - Other >> Nov 19, 2023  9:55 AM Sophia H wrote: Reason for CRM: patient is calling in to find out if Dr. Perri has spoke with Dr. Onesimo over at the cancer center regarding her hip. Please advise  [phone]      Please advise.

## 2023-11-19 NOTE — Telephone Encounter (Signed)
 Spoke to patient let her know about the note as well as referrals. Let her know they should contact her in about a week or two and if she doesn't hear anything to contact us  back.

## 2023-11-22 ENCOUNTER — Encounter: Payer: Self-pay | Admitting: Internal Medicine

## 2023-11-22 NOTE — Patient Instructions (Addendum)
 Will ask Dr. Onesimo if patient is stable from Oncology standpoint to undergo hip arthroplasty. Needs pain management. Sending her to [facility].Have provided her with 5 day supply of narcotic pain medication. She does not want to go to [facility].Making  urgent referral to Dr. Medford Poli.

## 2023-11-23 ENCOUNTER — Other Ambulatory Visit: Payer: Self-pay

## 2023-11-23 DIAGNOSIS — M25559 Pain in unspecified hip: Secondary | ICD-10-CM

## 2023-12-01 ENCOUNTER — Other Ambulatory Visit: Payer: Self-pay

## 2023-12-01 DIAGNOSIS — D473 Essential (hemorrhagic) thrombocythemia: Secondary | ICD-10-CM

## 2023-12-01 MED ORDER — HYDROXYUREA 500 MG PO CAPS: ORAL_CAPSULE | ORAL | 4 refills | Status: DC

## 2023-12-02 ENCOUNTER — Encounter: Payer: Self-pay | Admitting: Physical Medicine and Rehabilitation

## 2023-12-07 ENCOUNTER — Ambulatory Visit (INDEPENDENT_AMBULATORY_CARE_PROVIDER_SITE_OTHER): Admitting: Physician Assistant

## 2023-12-07 ENCOUNTER — Other Ambulatory Visit (INDEPENDENT_AMBULATORY_CARE_PROVIDER_SITE_OTHER)

## 2023-12-07 ENCOUNTER — Encounter: Payer: Self-pay | Admitting: Physician Assistant

## 2023-12-07 VITALS — Ht 58.27 in | Wt 135.2 lb

## 2023-12-07 DIAGNOSIS — M87052 Idiopathic aseptic necrosis of left femur: Secondary | ICD-10-CM

## 2023-12-07 NOTE — Progress Notes (Signed)
 Office Visit Note   Patient: [name]           Date of Birth: 14-Oct-1940           MRN: [id number] Visit Date: 12/07/2023              Requested by: Perri Ronal PARAS, MD [address] [place],  [place] [address] PCP: Perri Ronal PARAS, MD   Assessment & Plan: Visit Diagnoses:  1. Avascular necrosis of bone of hip, left (HCC)     Plan: Radiographs reviewed with patient and her daughter is present today.  Patient does live with her daughter.  Recommend left total hip arthroplasty due to the avascular necrosis and completely dissolved femoral head.  Risk benefits of surgery discussed with the patient along with the postoperative protocol byDr. Vernetta and myself.  Risk includes but not limited to vessel injury, PE /DVT, wound healing problems, infection, leg length discrepancy and device.  Given the fact that she recently had an intra-articular injection we need to wait at least another 5 to 6 weeks for having her undergo a left total hip arthroplasty.  She denies any history of diabetes mellitus although it is on her chart.   She will need stronger anticoagulation than aspirin  due to the fact that she has a history of PE.  Follow-Up Instructions: Return for post op.   Orders:  Orders Placed This Encounter  Procedures   XR HIP UNILAT W OR W/O PELVIS 2-3 VIEWS LEFT   No orders of the defined types were placed in this encounter.     Procedures: No procedures performed   Clinical Data: No additional findings.   Subjective: Chief Complaint  Patient presents with   Left Hip - Pain    HPI [name] is a 83 year old female who presents today with her daughter for left hip pain.  Pains been ongoing for the past 2 months no recent falls or injuries but does state that she fell up about 3 months ago when she missed a step.  Did not seek any treatment.  She first began using a cane.  Now has progressed to using a walker due to the pain.  Radiographs left hip dated 10/28/2023  personally reviewed.  Age undetermined pubic rami fracture on the left.  Near bone-on-bone left hip arthritis.  Avascular necrosis left femoral head.  No other fractures identified.  Moderate narrowing of the right hip joint.  Status post L4-L5 lumbar fusion. Patient underwent an intra-articular injection left hip on 11/04/2023 states this really gave her no relief.  She does have rheumatoid arthritis and is been on and off prednisone  for years.  Otherwise medical history pertinent for history of pulmonary embolism postoperative, hypertension and chronic kidney disease.  Review of Systems  Constitutional:  Negative for chills and fever.  Respiratory:  Negative for shortness of breath.   Cardiovascular:  Negative for chest pain.  Musculoskeletal:  Positive for gait problem.  History of post op PE   Objective: Vital Signs: Ht 4' 10.27 (1.48 m)   Wt 135 lb 3.2 oz (61.3 kg)   BMI 28.00 kg/m   Physical Exam Constitutional:      Appearance: She is normal weight. She is not ill-appearing or diaphoretic.  Pulmonary:     Effort: Pulmonary effort is normal.  Neurological:     Mental Status: She is alert and oriented to person, place, and time.  Psychiatric:        Mood and Affect: Mood normal.  Ortho Exam Bilateral hips: Good range of motion right hip without pain.  Left hip very limited internal/external rotation due to pain.  Patient ambulates with an antalgic gait and the use of a walker.  Specialty Comments:  No specialty comments available.  Imaging: XR HIP UNILAT W OR W/O PELVIS 2-3 VIEWS LEFT Result Date: 12/07/2023 AP pelvis lateral view left hip: Rami fracture with the consolidation on the left.  Left femoral head has completely dissolved since 10/28/2023 secondary to avascular necrosis.  No other acute findings.    PMFS History: Patient Active Problem List   Diagnosis Date Noted   Iron  deficiency anemia 02/17/2020   S/P reverse total shoulder arthroplasty, right  08/07/2016   CKD (chronic kidney disease) stage 3, GFR 30-59 ml/min (HCC) 10/01/2015   Anemia 05/10/2015   Uterine prolapse 01/08/2015   Glaucoma 09/19/2014   S/P shoulder replacement 02/16/2014   Mouth ulcers 05/24/2013   Rheumatoid arthritis (HCC) 09/23/2012   Diabetes mellitus (HCC) 01/13/2011   Hyperlipidemia 01/13/2011   Hypertension 01/13/2011   GE reflux 01/13/2011   Glaucoma 01/13/2011   Irritable bowel syndrome 01/13/2011   Hx pulmonary embolism 01/13/2011   Past Medical History:  Diagnosis Date   Arthritis    Cervical stenosis of spine    Dysphasia    Essential hypertension, benign    GERD (gastroesophageal reflux disease)    Glaucoma    Heart murmur    History of estrogen therapy    Hypercholesteremia    IBS (irritable bowel syndrome)    Mitral regurgitation    Pulmonary air embolism (HCC)    states she had this wiht her back surgery   Rheumatoid arthritis (HCC)    Seborrhea    patient denies    Family History  Problem Relation Age of Onset   Healthy Daughter     Past Surgical History:  Procedure Laterality Date   ANTERIOR AND POSTERIOR REPAIR WITH SACROSPINOUS FIXATION N/A 01/08/2015   Procedure: ANTERIOR AND POSTERIOR REPAIR WITH possible SACROSPINOUS FIXATION;  Surgeon: Charlie Croak, MD;  Location: [facility];  Service: Gynecology;  Laterality: N/A;   BACK SURGERY     CATARACT EXTRACTION W/ INTRAOCULAR LENS  IMPLANT, BILATERAL Bilateral    COLONOSCOPY     EYE SURGERY     LAPAROSCOPIC ASSISTED VAGINAL HYSTERECTOMY N/A 01/08/2015   Procedure: LAPAROSCOPIC ASSISTED VAGINAL HYSTERECTOMY;  Surgeon: Charlie Croak, MD;  Location: [facility];  Service: Gynecology;  Laterality: N/A;   LUMBAR FUSION  05/2010   REVERSE SHOULDER ARTHROPLASTY Left 02/16/2014   Procedure: LEFT REVERSE TOTAL SHOULDER ARTHROPLASTY;  Surgeon: Franky CHRISTELLA Pointer, MD;  Location: [facility];  Service: Orthopedics;  Laterality: Left;   REVERSE SHOULDER ARTHROPLASTY Right 08/07/2016   REVERSE SHOULDER  ARTHROPLASTY Right 08/07/2016   Procedure: REVERSE RIGHT SHOULDER ARTHROPLASTY;  Surgeon: Franky Pointer, MD;  Location: [facility];  Service: Orthopedics;  Laterality: Right;   TOE SURGERY     TUBAL LIGATION     Social History   Occupational History   Not on file  Tobacco Use   Smoking status: Never   Smokeless tobacco: Never  Substance and Sexual Activity   Alcohol use: No   Drug use: No   Sexual activity: Never

## 2023-12-09 ENCOUNTER — Other Ambulatory Visit: Payer: Self-pay | Admitting: Internal Medicine

## 2023-12-09 NOTE — Telephone Encounter (Unsigned)
 Copied from CRM [id number]. Topic: Clinical - Medication Refill >> Dec 09, 2023  9:35 AM Tonda B wrote: Medication: mupirocin  ointment (BACTROBAN ) 2 %  Has the patient contacted their pharmacy? Yes (Agent: If no, request that the patient contact the pharmacy for the refill. If patient does not wish to contact the pharmacy document the reason why and proceed with request.) (Agent: If yes, when and what did the pharmacy advise?)  This is the patient's preferred pharmacy:  [facility] [address], [place] - [address] [place] [address] Phone: [phone] Fax: [phone]  Is this the correct pharmacy for this prescription? Yes If no, delete pharmacy and type the correct one.   Has the prescription been filled recently? Yes  Is the patient out of the medication? Yes  Has the patient been seen for an appointment in the last year OR does the patient have an upcoming appointment? Yes  Can we respond through MyChart? No  Agent: Please be advised that Rx refills may take up to 3 business days. We ask that you follow-up with your pharmacy.

## 2023-12-17 ENCOUNTER — Encounter (HOSPITAL_COMMUNITY): Payer: Self-pay

## 2023-12-17 NOTE — Progress Notes (Addendum)
 COVID Vaccine Completed:  Date of COVID positive in last 90 days:  No  PCP - Ronal Hailstone, MD Cardiologist - n/a  Chest x-ray -  n/a EKG - 12-21-23 Epic Stress Test - Yes, 20+ years ago ECHO -  n/a Cardiac Cath -  n/a Pacemaker/ICD device last checked: n/a Spinal Cord Stimulator: n/a  Bowel Prep -  n/a  Sleep Study -  n/a CPAP -   Fasting Blood Sugar -  n/a Checks Blood Sugar _____ times a day  Last dose of GLP1 agonist-  N/A GLP1 instructions:  Do not take after     Last dose of SGLT-2 inhibitors-  N/A SGLT-2 instructions:  Do not take after    Blood Thinner Instructions:  Last dose:   Time: Aspirin  Instructions:  ASA 81.  Per patient to stop one week prior Last Dose:  Activity level:  Unable to climb stairs due to severe hip pain.  Able to perform activities of daily living without  symptoms of chest pain or shortness of breath.  Does have to stop and rest with activity due to hip pain.    Anesthesia review:  Murmur listed in history (pt states that she has never been told that she has a murmur).    Platelets 1158 on preop labs, hemoglobin 10.3  BP elevated at preop 173/80 and on recheck 164/70.  Pt has hx of HTN but not on any meds.  Denies chest pain, headache or SOB.  Pt was having 8/10 hip pain at visit  Patient denies shortness of breath, fever, cough and chest pain at PAT appointment  Patient verbalized understanding of instructions that were given to them at the PAT appointment. Patient was also instructed that they will need to review over the PAT instructions again at home before surgery.

## 2023-12-17 NOTE — Progress Notes (Signed)
 Sent message, via epic in basket, requesting orders in epic from Careers adviser.

## 2023-12-17 NOTE — Patient Instructions (Addendum)
SURGICAL WAITING ROOM VISITATION Patients having surgery or a procedure may have no more than 2 support people in the waiting area - these visitors may rotate.    Children under the age of 49 must have an adult with them who is not the patient.  If the patient needs to stay at the hospital during part of their recovery, the visitor guidelines for inpatient rooms apply. Pre-op nurse will coordinate an appropriate time for 1 support person to accompany patient in pre-op.  This support person may not rotate.    Please refer to the [facility] website for the visitor guidelines for Inpatients (after your surgery is over and you are in a regular room).       Your procedure is scheduled on: 01/22/2024   Report to [facility] Main Entrance    Report to admitting at 5:15 AM   Call this number if you have problems the morning of surgery [phone]   Do not eat food :After Midnight.   After Midnight you may have the following liquids until 4:30 AM DAY OF SURGERY  Water Non-Citrus Juices (without pulp, NO RED-Apple, White grape, White cranberry) Black Coffee (NO MILK/CREAM OR CREAMERS, sugar ok)  Clear Tea (NO MILK/CREAM OR CREAMERS, sugar ok) regular and decaf                             Plain Jell-O (NO RED)                                           Fruit ices (not with fruit pulp, NO RED)                                     Popsicles (NO RED)                                                               Sports drinks like Gatorade (NO RED)                   The day of surgery:  Drink ONE (1) Pre-Surgery G2 by 4:30 AM the morning of surgery. Drink in one sitting. Do not sip.  This drink was given to you during your hospital  pre-op appointment visit. Nothing else to drink after completing the Pre-Surgery G2.          If you have questions, please contact your surgeon's office.   FOLLOW  ANY ADDITIONAL PRE OP INSTRUCTIONS YOU RECEIVED FROM YOUR SURGEON'S OFFICE!!!     Oral  Hygiene is also important to reduce your risk of infection.                                    Remember - BRUSH YOUR TEETH THE MORNING OF SURGERY WITH YOUR REGULAR TOOTHPASTE   Do NOT smoke after Midnight   Take these medicines the morning of surgery with A SIP OF WATER:    Atorvastatin    Omeprazole   Tylenol  if needed   Okay to use eardrops and eyedrops  Stop all vitamins and herbal supplements 7 days before surgery             You may not have any metal on your body including hair pins, jewelry, and body piercing             Do not wear make-up, lotions, powders, perfumes or deodorant  Do not wear nail polish including gel and S&S, artificial/acrylic nails, or any other type of covering on natural nails including finger and toenails. If you have artificial nails, gel coating, etc. that needs to be removed by a nail salon please have this removed prior to surgery or surgery may need to be canceled/ delayed if the surgeon/ anesthesia feels like they are unable to be safely monitored.   Do not shave  48 hours prior to surgery.    Do not bring valuables to the hospital. [name] IS NOT RESPONSIBLE   FOR VALUABLES.   Contacts, dentures or bridgework may not be worn into surgery.   Bring small overnight bag day of surgery.   DO NOT BRING YOUR HOME MEDICATIONS TO THE HOSPITAL. PHARMACY WILL DISPENSE MEDICATIONS LISTED ON YOUR MEDICATION LIST TO YOU DURING YOUR ADMISSION IN THE HOSPITAL!                Please read over the following fact sheets you were given: IF YOU HAVE QUESTIONS ABOUT YOUR PRE-OP INSTRUCTIONS PLEASE CALL [phone] [name]  If you received a COVID test during your pre-op visit  it is requested that you wear a mask when out in public, stay away from anyone that may not be feeling well and notify your surgeon if you develop symptoms. If you test positive for Covid or have been in contact with anyone that has tested positive in the last 10 days please notify you  surgeon.    Pre-operative 5 CHG Bath Instructions   You can play a key role in reducing the risk of infection after surgery. Your skin needs to be as free of germs as possible. You can reduce the number of germs on your skin by washing with CHG (chlorhexidine  gluconate) soap before surgery. CHG is an antiseptic soap that kills germs and continues to kill germs even after washing.   DO NOT use if you have an allergy to chlorhexidine /CHG or antibacterial soaps. If your skin becomes reddened or irritated, stop using the CHG and notify one of our RNs at [phone].   Please shower with the CHG soap starting 4 days before surgery using the following schedule:     Please keep in mind the following:  DO NOT shave, including legs and underarms, starting the day of your first shower.   You may shave your face at any point before/day of surgery.  Place clean sheets on your bed the day you start using CHG soap. Use a clean washcloth (not used since being washed) for each shower. DO NOT sleep with pets once you start using the CHG.   CHG Shower Instructions:  If you choose to wash your hair and private area, wash first with your normal shampoo/soap.  After you use shampoo/soap, rinse your hair and body thoroughly to remove shampoo/soap residue.  Turn the water OFF and apply about 3 tablespoons (45 ml) of CHG soap to a CLEAN washcloth.  Apply CHG soap ONLY FROM YOUR NECK DOWN TO YOUR TOES (washing for 3-5 minutes)  DO NOT use CHG soap  on face, private areas, open wounds, or sores.  Pay special attention to the area where your surgery is being performed.  If you are having back surgery, having someone wash your back for you may be helpful. Wait 2 minutes after CHG soap is applied, then you may rinse off the CHG soap.  Pat dry with a clean towel  Put on clean clothes/pajamas   If you choose to wear lotion, please use ONLY the CHG-compatible lotions on the back of this paper.     Additional  instructions for the day of surgery: DO NOT APPLY any lotions, deodorants, cologne, or perfumes.   Put on clean/comfortable clothes.  Brush your teeth.  Ask your nurse before applying any prescription medications to the skin.      CHG Compatible Lotions   Aveeno Moisturizing lotion  Cetaphil Moisturizing Cream  Cetaphil Moisturizing Lotion  Clairol Herbal Essence Moisturizing Lotion, Dry Skin  Clairol Herbal Essence Moisturizing Lotion, Extra Dry Skin  Clairol Herbal Essence Moisturizing Lotion, Normal Skin  Curel Age Defying Therapeutic Moisturizing Lotion with Alpha Hydroxy  Curel Extreme Care Body Lotion  Curel Soothing Hands Moisturizing Hand Lotion  Curel Therapeutic Moisturizing Cream, Fragrance-Free  Curel Therapeutic Moisturizing Lotion, Fragrance-Free  Curel Therapeutic Moisturizing Lotion, Original Formula  Eucerin Daily Replenishing Lotion  Eucerin Dry Skin Therapy Plus Alpha Hydroxy Crme  Eucerin Dry Skin Therapy Plus Alpha Hydroxy Lotion  Eucerin Original Crme  Eucerin Original Lotion  Eucerin Plus Crme Eucerin Plus Lotion  Eucerin TriLipid Replenishing Lotion  Keri Anti-Bacterial Hand Lotion  Keri Deep Conditioning Original Lotion Dry Skin Formula Softly Scented  Keri Deep Conditioning Original Lotion, Fragrance Free Sensitive Skin Formula  Keri Lotion Fast Absorbing Fragrance Free Sensitive Skin Formula  Keri Lotion Fast Absorbing Softly Scented Dry Skin Formula  Keri Original Lotion  Keri Skin Renewal Lotion Keri Silky Smooth Lotion  Keri Silky Smooth Sensitive Skin Lotion  Nivea Body Creamy Conditioning Oil  Nivea Body Extra Enriched Lotion  Nivea Body Original Lotion  Nivea Body Sheer Moisturizing Lotion Nivea Crme  Nivea Skin Firming Lotion  NutraDerm 30 Skin Lotion  NutraDerm Skin Lotion  NutraDerm Therapeutic Skin Cream  NutraDerm Therapeutic Skin Lotion  ProShield Protective Hand Cream  Provon moisturizing lotion   PATIENT  SIGNATURE_________________________________  NURSE SIGNATURE__________________________________  ________________________________________________________________________    [name]  An incentive spirometer is a tool that can help keep your lungs clear and active. This tool measures how well you are filling your lungs with each breath. Taking long deep breaths may help reverse or decrease the chance of developing breathing (pulmonary) problems (especially infection) following: A long period of time when you are unable to move or be active. BEFORE THE PROCEDURE  If the spirometer includes an indicator to show your best effort, your nurse or respiratory therapist will set it to a desired goal. If possible, sit up straight or lean slightly forward. Try not to slouch. Hold the incentive spirometer in an upright position. INSTRUCTIONS FOR USE  Sit on the edge of your bed if possible, or sit up as far as you can in bed or on a chair. Hold the incentive spirometer in an upright position. Breathe out normally. Place the mouthpiece in your mouth and seal your lips tightly around it. Breathe in slowly and as deeply as possible, raising the piston or the ball toward the top of the column. Hold your breath for 3-5 seconds or for as long as possible. Allow the piston or ball to fall  to the bottom of the column. Remove the mouthpiece from your mouth and breathe out normally. Rest for a few seconds and repeat Steps 1 through 7 at least 10 times every 1-2 hours when you are awake. Take your time and take a few normal breaths between deep breaths. The spirometer may include an indicator to show your best effort. Use the indicator as a goal to work toward during each repetition. After each set of 10 deep breaths, practice coughing to be sure your lungs are clear. If you have an incision (the cut made at the time of surgery), support your incision when coughing by placing a pillow or rolled up towels  firmly against it. Once you are able to get out of bed, walk around indoors and cough well. You may stop using the incentive spirometer when instructed by your caregiver.  RISKS AND COMPLICATIONS Take your time so you do not get dizzy or light-headed. If you are in pain, you may need to take or ask for pain medication before doing incentive spirometry. It is harder to take a deep breath if you are having pain. AFTER USE Rest and breathe slowly and easily. It can be helpful to keep track of a log of your progress. Your caregiver can provide you with a simple table to help with this. If you are using the spirometer at home, follow these instructions: SEEK MEDICAL CARE IF:  You are having difficultly using the spirometer. You have trouble using the spirometer as often as instructed. Your pain medication is not giving enough relief while using the spirometer. You develop fever of 100.5 F (38.1 C) or higher. SEEK IMMEDIATE MEDICAL CARE IF:  You cough up bloody sputum that had not been present before. You develop fever of 102 F (38.9 C) or greater. You develop worsening pain at or near the incision site. MAKE SURE YOU:  Understand these instructions. Will watch your condition. Will get help right away if you are not doing well or get worse. Document Released: 09/22/2006 Document Revised: 08/04/2011 Document Reviewed: 11/23/2006 Exit[employer] Patient Information 2014 Exit[employer], [place].   ________________________________________________________________________ WHAT IS A BLOOD TRANSFUSION? Blood Transfusion Information  A transfusion is the replacement of blood or some of its parts. Blood is made up of multiple cells which provide different functions. Red blood cells carry oxygen and are used for blood loss replacement. White blood cells fight against infection. Platelets control bleeding. Plasma helps clot blood. Other blood products are available for specialized needs, such as hemophilia or  other clotting disorders. BEFORE THE TRANSFUSION  Who gives blood for transfusions?  Healthy volunteers who are fully evaluated to make sure their blood is safe. This is blood bank blood. Transfusion therapy is the safest it has ever been in the practice of medicine. Before blood is taken from a donor, a complete history is taken to make sure that person has no history of diseases nor engages in risky social behavior (examples are intravenous drug use or sexual activity with multiple partners). The donor's travel history is screened to minimize risk of transmitting infections, such as malaria. The donated blood is tested for signs of infectious diseases, such as HIV and hepatitis. The blood is then tested to be sure it is compatible with you in order to minimize the chance of a transfusion reaction. If you or a relative donates blood, this is often done in anticipation of surgery and is not appropriate for emergency situations. It takes many days to process the donated blood.  RISKS AND COMPLICATIONS Although transfusion therapy is very safe and saves many lives, the main dangers of transfusion include:  Getting an infectious disease. Developing a transfusion reaction. This is an allergic reaction to something in the blood you were given. Every precaution is taken to prevent this. The decision to have a blood transfusion has been considered carefully by your caregiver before blood is given. Blood is not given unless the benefits outweigh the risks. AFTER THE TRANSFUSION Right after receiving a blood transfusion, you will usually feel much better and more energetic. This is especially true if your red blood cells have gotten low (anemic). The transfusion raises the level of the red blood cells which carry oxygen, and this usually causes an energy increase. The nurse administering the transfusion will monitor you carefully for complications. HOME CARE INSTRUCTIONS  No special instructions are needed after  a transfusion. You may find your energy is better. Speak with your caregiver about any limitations on activity for underlying diseases you may have. SEEK MEDICAL CARE IF:  Your condition is not improving after your transfusion. You develop redness or irritation at the intravenous (IV) site. SEEK IMMEDIATE MEDICAL CARE IF:  Any of the following symptoms occur over the next 12 hours: Shaking chills. You have a temperature by mouth above 102 F (38.9 C), not controlled by medicine. Chest, back, or muscle pain. People around you feel you are not acting correctly or are confused. Shortness of breath or difficulty breathing. Dizziness and fainting. You get a rash or develop hives. You have a decrease in urine output. Your urine turns a dark color or changes to pink, red, or brown. Any of the following symptoms occur over the next 10 days: You have a temperature by mouth above 102 F (38.9 C), not controlled by medicine. Shortness of breath. Weakness after normal activity. The white part of the eye turns yellow (jaundice). You have a decrease in the amount of urine or are urinating less often. Your urine turns a dark color or changes to pink, red, or brown. Document Released: 05/09/2000 Document Revised: 08/04/2011 Document Reviewed: 12/27/2007 [facility] Patient Information 2014 [place], [place].  _______________________________________________________________________

## 2023-12-18 ENCOUNTER — Other Ambulatory Visit: Payer: Self-pay | Admitting: Physician Assistant

## 2023-12-18 DIAGNOSIS — Z01818 Encounter for other preprocedural examination: Secondary | ICD-10-CM

## 2023-12-21 ENCOUNTER — Other Ambulatory Visit: Payer: Self-pay

## 2023-12-21 ENCOUNTER — Encounter (HOSPITAL_COMMUNITY): Payer: Self-pay

## 2023-12-21 ENCOUNTER — Encounter (HOSPITAL_COMMUNITY)
Admission: RE | Admit: 2023-12-21 | Discharge: 2023-12-21 | Disposition: A | Discharge status: Home / Self Care | Source: Ambulatory Visit | Attending: Orthopaedic Surgery

## 2023-12-21 VITALS — BP 164/70 | HR 74 | Temp 98.5°F | Resp 16 | Ht <= 58 in | Wt 136.0 lb

## 2023-12-21 DIAGNOSIS — D649 Anemia, unspecified: Secondary | ICD-10-CM

## 2023-12-21 DIAGNOSIS — Z01818 Encounter for other preprocedural examination: Secondary | ICD-10-CM | POA: Diagnosis not present

## 2023-12-21 DIAGNOSIS — I10 Essential (primary) hypertension: Secondary | ICD-10-CM | POA: Insufficient documentation

## 2023-12-21 HISTORY — DX: Prediabetes: R73.03

## 2023-12-21 HISTORY — DX: Headache, unspecified: R51.9

## 2023-12-21 HISTORY — DX: Gout, unspecified: M10.9

## 2023-12-21 HISTORY — DX: Anemia, unspecified: D64.9

## 2023-12-21 LAB — BASIC METABOLIC PANEL WITH GFR
Anion gap: 10 (ref 5–15)
BUN: 22 mg/dL (ref 8–23)
CO2: 24 mmol/L (ref 22–32)
Calcium: 9.8 mg/dL (ref 8.9–10.3)
Chloride: 101 mmol/L (ref 98–111)
Creatinine, Ser: 0.78 mg/dL (ref 0.44–1.00)
GFR, Estimated: >60 mL/min (ref >60)
Glucose, Bld: 85 mg/dL (ref 70–99)
Potassium: 4.2 mmol/L (ref 3.5–5.1)
Sodium: 135 mmol/L (ref 135–145)

## 2023-12-21 LAB — CBC
HCT: 30.9 % — ABNORMAL LOW (ref 36.0–46.0)
Hemoglobin: 10.3 g/dL — ABNORMAL LOW (ref 12.0–15.0)
MCH: 41.7 pg — ABNORMAL HIGH (ref 26.0–34.0)
MCHC: 33.3 g/dL (ref 30.0–36.0)
MCV: 125.1 fL — ABNORMAL HIGH (ref 80.0–100.0)
Platelets: 1158 K/uL (ref 150–400)
RBC: 2.47 MIL/uL — ABNORMAL LOW (ref 3.87–5.11)
RDW: 15.4 % (ref 11.5–15.5)
WBC: 6.5 K/uL (ref 4.0–10.5)
nRBC: 0.5 % — ABNORMAL HIGH (ref 0.0–0.2)

## 2023-12-21 LAB — SURGICAL PCR SCREEN
MRSA, PCR: NEGATIVE
Staphylococcus aureus: NEGATIVE

## 2023-12-22 ENCOUNTER — Encounter (HOSPITAL_COMMUNITY): Payer: Self-pay

## 2023-12-22 NOTE — Progress Notes (Signed)
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  Seen by PCP on 11/17/23 for severe L hip pain. Referred to Ortho and now scheduled for hip replacement.  Patient follows with Hematology for thrombocytosis. Last seen on 10/09/23 by Dr. Onesimo. Takes Hydroxyurea . Per Dr. 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  Hx of RA and follows with Rheum.   Hx of PE after lumbar fusion in 2012.  VS: BP (!) 164/70   Pulse 74   Temp 36.9 C (Oral)   Resp 16   Ht 4' 10 (1.473 m)   Wt 61.7 kg   SpO2 99%   BMI 28.42 kg/m   PROVIDERS: Perri Ronal PARAS, MD   LABS: Labs reviewed: Acceptable for surgery. Anemia and platelets stable. (all labs ordered are listed, but only abnormal results are displayed)  Labs Reviewed  CBC - Abnormal; Notable for the following components:      Result Value   RBC 2.47 (*)    Hemoglobin 10.3 (*)    HCT 30.9 (*)    MCV 125.1 (*)    MCH 41.7 (*)    Platelets 1,158 (*)    nRBC 0.5 (*)    All other components within normal limits  SURGICAL PCR SCREEN  BASIC METABOLIC PANEL WITH GFR  TYPE AND SCREEN     IMAGES:   EKG 12/21/23:  NSR, rate 68  CV:  Echo 06/15/2010:  Study Conclusions  -LV: There was moderate concentric hypertrophy. Systolic function was vigorous. The estimated EF was in the range of 65-70%. The study is not technically sufficient to allow evaluation of the LV  diastolic function. -Pulmonary arteries: Systolic pressure was mildly increased. PA peak pressure: 48 mmHg -Aortic valve: Poorly visualized. Doppler: There was no stenosis. No regurgitation. -Mitral valve: Poorly visualized. Structurally normal valve. Leaflet separation was normal. Doppler: Transvalvular velocity was within the normal range. There was no evidence for stenosis. No regurgitation.  Past Medical History:  Diagnosis Date   Anemia    Arthritis    Cervical stenosis of spine    Dysphasia    Essential hypertension, benign    GERD (gastroesophageal reflux disease)    Glaucoma    Gout    Headache    Heart murmur    History of estrogen therapy    Hypercholesteremia    IBS (irritable bowel syndrome)    Mitral regurgitation    Pre-diabetes    Pulmonary air embolism (HCC)    states she had this wiht her back surgery   Rheumatoid arthritis (HCC)    Seborrhea    patient denies    Past Surgical History:  Procedure Laterality Date   ANTERIOR AND POSTERIOR REPAIR WITH SACROSPINOUS FIXATION N/A 01/08/2015   Procedure: ANTERIOR AND POSTERIOR REPAIR WITH possible SACROSPINOUS FIXATION;  Surgeon: Charlie Croak, MD;  Location: [facility];  Service: Gynecology;  Laterality: N/A;   BACK SURGERY  CATARACT EXTRACTION W/ INTRAOCULAR LENS  IMPLANT, BILATERAL Bilateral    COLONOSCOPY     EYE SURGERY     LAPAROSCOPIC ASSISTED VAGINAL HYSTERECTOMY N/A 01/08/2015   Procedure: LAPAROSCOPIC ASSISTED VAGINAL HYSTERECTOMY;  Surgeon: Charlie Croak, MD;  Location: [facility];  Service: Gynecology;  Laterality: N/A;   LUMBAR FUSION  05/2010   REVERSE SHOULDER ARTHROPLASTY Left 02/16/2014   Procedure: LEFT REVERSE TOTAL SHOULDER ARTHROPLASTY;  Surgeon: Franky CHRISTELLA Pointer, MD;  Location: [facility];  Service: Orthopedics;  Laterality: Left;   REVERSE SHOULDER ARTHROPLASTY Right 08/07/2016   REVERSE SHOULDER ARTHROPLASTY Right 08/07/2016   Procedure: REVERSE RIGHT SHOULDER ARTHROPLASTY;  Surgeon: Franky Pointer, MD;   Location: [facility];  Service: Orthopedics;  Laterality: Right;   TOE SURGERY     TUBAL LIGATION      MEDICATIONS:  acetaminophen  (TYLENOL ) 500 MG tablet   Ascorbic Acid (VITAMIN C PO)   aspirin  81 MG tablet   atorvastatin  (LIPITOR) 20 MG tablet   b complex vitamins capsule   calcium  carbonate (TUMS - DOSED IN MG ELEMENTAL CALCIUM ) 500 MG chewable tablet   cholecalciferol  (VITAMIN D3) 25 MCG (1000 UNIT) tablet   clotrimazole -betamethasone  (LOTRISONE ) cream   cyanocobalamin  (VITAMIN B12) 500 MCG tablet   diphenhydrAMINE  (BENADRYL ) 25 MG tablet   ferrous sulfate  325 (65 FE) MG EC tablet   hydroxyurea  (HYDREA ) 500 MG capsule   lansoprazole  (PREVACID ) 30 MG capsule   mupirocin  ointment (BACTROBAN ) 2 %   NEOMYCIN -POLYMYXIN-HYDROCORTISONE (CORTISPORIN) 1 % SOLN OTIC solution   olopatadine  (PATADAY ) 0.1 % ophthalmic solution   omeprazole  (PRILOSEC OTC) 20 MG tablet   polyethylene glycol powder (GLYCOLAX /MIRALAX ) powder   predniSONE  (DELTASONE ) 10 MG tablet   No current facility-administered medications for this encounter.   Burnard CHRISTELLA Odis DEVONNA [facility] Surgical Short Stay/Anesthesiology [facility] Phone [phone] 12/29/2023 8:59 AM

## 2023-12-23 ENCOUNTER — Other Ambulatory Visit: Payer: Self-pay | Admitting: Internal Medicine

## 2023-12-30 ENCOUNTER — Telehealth: Payer: Self-pay | Admitting: *Deleted

## 2023-12-30 DIAGNOSIS — M87052 Idiopathic aseptic necrosis of left femur: Principal | ICD-10-CM | POA: Insufficient documentation

## 2023-12-30 NOTE — Care Plan (Signed)
 [facility] RNCM call to patient to discuss her upcoming Left total hip arthroplasty with Dr. Vernetta on 25-Jan-2024 at [facility]. She is agreeable to case management. She lives with her daughter, who is able to assist as needed after discharge home. She states she does have a RW at home already that she uses. Anticipate HHPT will be needed after short hospital stay. Referral made to [facility] after choice provided. Reviewed post op care instructions and answered questions Will continue to follow for needs.

## 2023-12-30 NOTE — H&P (Signed)
 TOTAL HIP ADMISSION H&P  Patient is admitted for left total hip arthroplasty.  Subjective:  Chief Complaint: left hip pain  HPI: [name], 83 y.o. female, has a history of pain and functional disability in the left hip(s) due to avascular necrosis and patient has failed non-surgical conservative treatments for greater than 12 weeks to include NSAID's and/or analgesics, use of assistive devices, and activity modification.  Onset of symptoms was abrupt starting just a few months ago with rapidlly worsening course since that time.The patient noted no past surgery on the left hip(s).  Patient currently rates pain in the left hip at 10 out of 10 with activity. Patient has night pain, worsening of pain with activity and weight bearing, trendelenberg gait, pain that interfers with activities of daily living, and pain with passive range of motion. Patient has evidence of subchondral cysts and femoral head collapse by imaging studies. This condition presents safety issues increasing the risk of falls. This patient has had avascular necrosis of the hip, acetabular fracture, hip dysplasia.  There is no current active infection.  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   CKD (chronic kidney disease) stage 3, GFR 30-59 ml/min (HCC) 10/01/2015   Anemia 05/10/2015   Uterine prolapse 01/08/2015   Glaucoma 09/19/2014   S/P shoulder replacement 02/16/2014   Mouth ulcers 05/24/2013   Rheumatoid arthritis (HCC) 09/23/2012   Diabetes mellitus (HCC) 01/13/2011   Hyperlipidemia 01/13/2011   Hypertension 01/13/2011   GE reflux 01/13/2011   Glaucoma 01/13/2011   Irritable bowel syndrome 01/13/2011   Hx pulmonary embolism 01/13/2011   Past Medical History:  Diagnosis Date   Anemia    Arthritis    Cervical stenosis of spine    Dysphasia     Essential hypertension, benign    GERD (gastroesophageal reflux disease)    Glaucoma    Gout    Headache    Heart murmur    History of estrogen therapy    Hypercholesteremia    IBS (irritable bowel syndrome)    Mitral regurgitation    Pre-diabetes    Pulmonary air embolism (HCC)    states she had this wiht her back surgery   Rheumatoid arthritis (HCC)    Seborrhea    patient denies    Past Surgical History:  Procedure Laterality Date   ANTERIOR AND POSTERIOR REPAIR WITH SACROSPINOUS FIXATION N/A 01/08/2015   Procedure: ANTERIOR AND POSTERIOR REPAIR WITH possible SACROSPINOUS FIXATION;  Surgeon: Charlie Croak, MD;  Location: [facility];  Service: Gynecology;  Laterality: N/A;   BACK SURGERY     CATARACT EXTRACTION W/ INTRAOCULAR LENS  IMPLANT, BILATERAL Bilateral    COLONOSCOPY     EYE SURGERY     LAPAROSCOPIC ASSISTED VAGINAL HYSTERECTOMY N/A 01/08/2015   Procedure: LAPAROSCOPIC ASSISTED VAGINAL HYSTERECTOMY;  Surgeon: Charlie Croak, MD;  Location: [facility];  Service: Gynecology;  Laterality: N/A;   LUMBAR FUSION  05/2010   REVERSE SHOULDER ARTHROPLASTY Left 02/16/2014   Procedure: LEFT REVERSE TOTAL SHOULDER ARTHROPLASTY;  Surgeon: Franky CHRISTELLA Pointer, MD;  Location: [facility];  Service: Orthopedics;  Laterality: Left;   REVERSE SHOULDER ARTHROPLASTY Right 08/07/2016   REVERSE SHOULDER ARTHROPLASTY Right 08/07/2016   Procedure: REVERSE RIGHT SHOULDER ARTHROPLASTY;  Surgeon: Franky Pointer, MD;  Location: [facility];  Service: Orthopedics;  Laterality: Right;   TOE SURGERY  TUBAL LIGATION      No current facility-administered medications for this encounter.   Current Outpatient Medications  Medication Sig Dispense Refill Last Dose/Taking   acetaminophen  (TYLENOL ) 500 MG tablet Take 1,000 mg by mouth every 6 (six) hours as needed for moderate pain (pain score 4-6).   Taking As Needed   Ascorbic Acid (VITAMIN C PO) Take 1 tablet by mouth daily.   Taking   aspirin  81 MG tablet Take 81 mg by mouth  daily.   Taking   atorvastatin  (LIPITOR) 20 MG tablet TAKE 1 TABLET BY MOUTH DAILY 100 tablet 2 Taking   b complex vitamins capsule Take 1 capsule by mouth daily.   Taking   calcium  carbonate (TUMS - DOSED IN MG ELEMENTAL CALCIUM ) 500 MG chewable tablet Chew 1 tablet by mouth 2 (two) times daily as needed for indigestion or heartburn.   Taking As Needed   cholecalciferol  (VITAMIN D3) 25 MCG (1000 UNIT) tablet Take 1,000 Units by mouth daily.   Taking   cyanocobalamin  (VITAMIN B12) 500 MCG tablet Take 500 mcg by mouth daily.   Taking   diphenhydrAMINE  (BENADRYL ) 25 MG tablet Take 25 mg by mouth every 6 (six) hours as needed for itching or allergies.   Taking As Needed   ferrous sulfate  325 (65 FE) MG EC tablet Take 325 mg by mouth daily.   Taking   hydroxyurea  (HYDREA ) 500 MG capsule TAKE 3 CAPSULES BY MOUTH MONDAYS TUESDAY THURSDAY AND FRIDAYS AND 2 CAPSULES BY MOUTH ON WEDNESDAY SATURDAY SUNDAYS TAKE WITH FOOD 90 capsule 4 Taking   NEOMYCIN -POLYMYXIN-HYDROCORTISONE (CORTISPORIN) 1 % SOLN OTIC solution Place 3 drops into the left ear 4 (four) times daily. (Patient taking differently: Place 3 drops into the left ear daily as needed (itching).) 10 mL 0 Taking Differently   olopatadine  (PATADAY ) 0.1 % ophthalmic solution Place 1 drop into both eyes daily.   Taking   omeprazole  (PRILOSEC OTC) 20 MG tablet Take 20 mg by mouth daily.   Taking   polyethylene glycol powder (GLYCOLAX /MIRALAX ) powder MIX 1 CAPFUL IN 8 OUNCES OF WATER  AND DRINK ONCE DAILY AS DIRECTED 527 g 0 Taking   clotrimazole -betamethasone  (LOTRISONE ) cream Apply 1 application topically 2 (two) times daily. (Patient not taking: Reported on 12/16/2023) 45 g 2 Not Taking   lansoprazole  (PREVACID ) 30 MG capsule Take 1 capsule (30 mg total) by mouth daily at 12 noon. (Patient not taking: Reported on 12/16/2023) 30 capsule  Not Taking   mupirocin  ointment (BACTROBAN ) 2 % APPLY TOPICALLY TO THE AFFECTED AREA TWICE DAILY AS NEEDED 22 g 11     predniSONE  (DELTASONE ) 10 MG tablet Take 4 tablets (40 mg total) by mouth daily with breakfast. (Patient not taking: No sig reported) 20 tablet 0    Allergies  Allergen Reactions   Alendronate    Allopurinol Swelling   Amoxicillin-Pot Clavulanate Diarrhea and Itching    PATIENT DOES NOT RECALL ALLERGIC REACTION TO AUGMENTIN.  Has patient had a PCN reaction causing immediate rash, facial/tongue/throat swelling, SOB or lightheadedness with hypotension: UNKNOWN Has patient had a PCN reaction causing severe rash involving mucus membranes or skin necrosis: UNKNOWN Has patient had a PCN reaction that required hospitalization No Has patient had a PCN reaction occurring within the last 10 years: No If all of the above answers are NO, then may proceed    Folic Acid     Methotrexate Derivatives    Latex Itching    Latex gloves    Tramadol  Nausea And Vomiting  Social History   Tobacco Use   Smoking status: Never   Smokeless tobacco: Never  Substance Use Topics   Alcohol use: No    Family History  Problem Relation Age of Onset   Healthy Daughter      Review of Systems  Objective:  Physical Exam Vitals reviewed.  Constitutional:      Appearance: Normal appearance. She is normal weight.  HENT:     Head: Normocephalic and atraumatic.  Eyes:     Extraocular Movements: Extraocular movements intact.     Pupils: Pupils are equal, round, and reactive to light.  Cardiovascular:     Rate and Rhythm: Normal rate.  Pulmonary:     Effort: Pulmonary effort is normal.     Breath sounds: Normal breath sounds.  Abdominal:     Palpations: Abdomen is soft.  Musculoskeletal:     Cervical back: Normal range of motion and neck supple.     Left hip: Tenderness and bony tenderness present. Decreased range of motion. Decreased strength.  Neurological:     Mental Status: She is alert and oriented to person, place, and time.  Psychiatric:        Behavior: Behavior normal.     Vital signs  in last 24 hours:    Labs:   Estimated body mass index is 28.42 kg/m as calculated from the following:   Height as of 12/21/23: 4' 10 (1.473 m).   Weight as of 12/21/23: 61.7 kg.   Imaging Review Plain radiographs demonstrate severe AVN of the left hip(s). The bone quality appears to be fair for age and reported activity level.      Assessment/Plan:  Severe avascular necrosis of left hip  The patient history, physical examination, clinical judgement of the provider and imaging studies are consistent with end stage AVN of the left hip(s) and total hip arthroplasty is deemed medically necessary. The treatment options including medical management, injection therapy, arthroscopy and arthroplasty were discussed at length. The risks and benefits of total hip arthroplasty were presented and reviewed. The risks due to aseptic loosening, infection, stiffness, dislocation/subluxation,  thromboembolic complications and other imponderables were discussed.  The patient acknowledged the explanation, agreed to proceed with the plan and consent was signed. Patient is being admitted for inpatient treatment for surgery, pain control, PT, OT, prophylactic antibiotics, VTE prophylaxis, progressive ambulation and ADL's and discharge planning.The patient is planning to be discharged home with home health services

## 2023-12-30 NOTE — Telephone Encounter (Signed)
 [facility] RNCM pre-op call to patient completed for upcoming Left total hip arthroplasty.

## 2023-12-31 ENCOUNTER — Ambulatory Visit (HOSPITAL_COMMUNITY)

## 2023-12-31 ENCOUNTER — Encounter (HOSPITAL_COMMUNITY): Payer: Self-pay | Admitting: Orthopaedic Surgery

## 2023-12-31 ENCOUNTER — Ambulatory Visit (HOSPITAL_COMMUNITY): Payer: Self-pay | Admitting: Medical

## 2023-12-31 ENCOUNTER — Ambulatory Visit (HOSPITAL_COMMUNITY): Admission: RE | Disposition: E | Payer: Self-pay | Source: Ambulatory Visit | Attending: Orthopaedic Surgery

## 2023-12-31 ENCOUNTER — Other Ambulatory Visit: Payer: Self-pay

## 2023-12-31 ENCOUNTER — Ambulatory Visit (HOSPITAL_BASED_OUTPATIENT_CLINIC_OR_DEPARTMENT_OTHER): Admitting: Anesthesiology

## 2023-12-31 ENCOUNTER — Observation Stay (HOSPITAL_COMMUNITY)
Admission: RE | Admit: 2023-12-31 | Discharge: 2024-01-25 | Disposition: E | Source: Ambulatory Visit | Attending: Orthopaedic Surgery | Admitting: Orthopaedic Surgery

## 2023-12-31 ENCOUNTER — Observation Stay (HOSPITAL_COMMUNITY)

## 2023-12-31 DIAGNOSIS — Z96642 Presence of left artificial hip joint: Secondary | ICD-10-CM | POA: Insufficient documentation

## 2023-12-31 DIAGNOSIS — I129 Hypertensive chronic kidney disease with stage 1 through stage 4 chronic kidney disease, or unspecified chronic kidney disease: Secondary | ICD-10-CM | POA: Diagnosis not present

## 2023-12-31 DIAGNOSIS — Z86711 Personal history of pulmonary embolism: Secondary | ICD-10-CM | POA: Insufficient documentation

## 2023-12-31 DIAGNOSIS — Z96611 Presence of right artificial shoulder joint: Secondary | ICD-10-CM | POA: Insufficient documentation

## 2023-12-31 DIAGNOSIS — M25552 Pain in left hip: Secondary | ICD-10-CM | POA: Diagnosis present

## 2023-12-31 DIAGNOSIS — Z01818 Encounter for other preprocedural examination: Secondary | ICD-10-CM

## 2023-12-31 DIAGNOSIS — M87852 Other osteonecrosis, left femur: Secondary | ICD-10-CM | POA: Diagnosis not present

## 2023-12-31 DIAGNOSIS — E1122 Type 2 diabetes mellitus with diabetic chronic kidney disease: Secondary | ICD-10-CM

## 2023-12-31 DIAGNOSIS — M87052 Idiopathic aseptic necrosis of left femur: Secondary | ICD-10-CM

## 2023-12-31 DIAGNOSIS — N183 Chronic kidney disease, stage 3 unspecified: Secondary | ICD-10-CM | POA: Insufficient documentation

## 2023-12-31 DIAGNOSIS — I10 Essential (primary) hypertension: Secondary | ICD-10-CM

## 2023-12-31 DIAGNOSIS — I469 Cardiac arrest, cause unspecified: Secondary | ICD-10-CM | POA: Insufficient documentation

## 2023-12-31 DIAGNOSIS — Z471 Aftercare following joint replacement surgery: Secondary | ICD-10-CM | POA: Diagnosis not present

## 2023-12-31 DIAGNOSIS — D509 Iron deficiency anemia, unspecified: Secondary | ICD-10-CM | POA: Insufficient documentation

## 2023-12-31 HISTORY — PX: TOTAL HIP ARTHROPLASTY: SHX124

## 2023-12-31 LAB — CBC
HCT: 24.6 % — ABNORMAL LOW (ref 36.0–46.0)
Hemoglobin: 8.1 g/dL — ABNORMAL LOW (ref 12.0–15.0)
MCH: 40.9 pg — ABNORMAL HIGH (ref 26.0–34.0)
MCHC: 32.9 g/dL (ref 30.0–36.0)
MCV: 124.2 fL — ABNORMAL HIGH (ref 80.0–100.0)
Platelets: 936 K/uL (ref 150–400)
RBC: 1.98 MIL/uL — ABNORMAL LOW (ref 3.87–5.11)
RDW: 14.7 % (ref 11.5–15.5)
WBC: 7.4 K/uL (ref 4.0–10.5)
nRBC: 0.3 % — ABNORMAL HIGH (ref 0.0–0.2)

## 2023-12-31 LAB — RETICULOCYTES
Immature Retic Fract: 36.4 % — ABNORMAL HIGH (ref 2.3–15.9)
RBC.: 2.01 MIL/uL — ABNORMAL LOW (ref 3.87–5.11)
Retic Count, Absolute: 36.8 K/uL (ref 19.0–186.0)
Retic Ct Pct: 1.8 % (ref 0.4–3.1)

## 2023-12-31 LAB — BASIC METABOLIC PANEL WITH GFR
Anion gap: 13 (ref 5–15)
BUN: 20 mg/dL (ref 8–23)
CO2: 21 mmol/L — ABNORMAL LOW (ref 22–32)
Calcium: 9.6 mg/dL (ref 8.9–10.3)
Chloride: 101 mmol/L (ref 98–111)
Creatinine, Ser: 0.7 mg/dL (ref 0.44–1.00)
GFR, Estimated: >60 mL/min (ref >60)
Glucose, Bld: 129 mg/dL — ABNORMAL HIGH (ref 70–99)
Potassium: 3.9 mmol/L (ref 3.5–5.1)
Sodium: 135 mmol/L (ref 135–145)

## 2023-12-31 LAB — DIFFERENTIAL
Abs Immature Granulocytes: 0.07 K/uL (ref 0.00–0.07)
Basophils Absolute: 0 K/uL (ref 0.0–0.1)
Basophils Relative: 0 %
Eosinophils Absolute: 0 K/uL (ref 0.0–0.5)
Eosinophils Relative: 1 %
Immature Granulocytes: 1 %
Lymphocytes Relative: 11 %
Lymphs Abs: 0.8 K/uL (ref 0.7–4.0)
Monocytes Absolute: 0.5 K/uL (ref 0.1–1.0)
Monocytes Relative: 7 %
Neutro Abs: 5.8 K/uL (ref 1.7–7.7)
Neutrophils Relative %: 80 %

## 2023-12-31 LAB — TYPE AND SCREEN
ABO/RH(D): A POS
Antibody Screen: NEGATIVE

## 2023-12-31 SURGERY — ARTHROPLASTY, HIP, TOTAL, ANTERIOR APPROACH
Anesthesia: Monitor Anesthesia Care | Site: Hip | Laterality: Left

## 2023-12-31 MED ORDER — ALBUMIN HUMAN 5 % IV SOLN
INTRAVENOUS | Status: AC
  Filled 2023-12-31: qty 250

## 2023-12-31 MED ORDER — FERROUS SULFATE 325 (65 FE) MG PO TABS
325.0000 mg | ORAL_TABLET | Freq: Every day | ORAL | Status: DC
  Administered 2023-12-31: 325 mg via ORAL
  Filled 2023-12-31: qty 1

## 2023-12-31 MED ORDER — ONDANSETRON HCL 4 MG/2ML IJ SOLN: 4.0000 mg | Freq: Four times a day (QID) | INTRAMUSCULAR | Status: DC | PRN

## 2023-12-31 MED ORDER — EPHEDRINE SULFATE (PRESSORS) 50 MG/ML IJ SOLN
INTRAMUSCULAR | Status: DC | PRN
  Administered 2023-12-31 (×4): 5 mg via INTRAVENOUS

## 2023-12-31 MED ORDER — PROPOFOL 500 MG/50ML IV EMUL
INTRAVENOUS | Status: DC | PRN
  Administered 2023-12-31: 10 mg via INTRAVENOUS
  Administered 2023-12-31: 30 ug/kg/min via INTRAVENOUS

## 2023-12-31 MED ORDER — HYDROXYUREA 500 MG PO CAPS: 500.0000 mg | ORAL_CAPSULE | Freq: Every day | ORAL | Status: DC

## 2023-12-31 MED ORDER — DOCUSATE SODIUM 100 MG PO CAPS
100.0000 mg | ORAL_CAPSULE | Freq: Two times a day (BID) | ORAL | Status: DC
  Administered 2023-12-31: 100 mg via ORAL
  Filled 2023-12-31: qty 1

## 2023-12-31 MED ORDER — ASPIRIN 81 MG PO CHEW: 81.0000 mg | CHEWABLE_TABLET | Freq: Two times a day (BID) | ORAL | Status: DC

## 2023-12-31 MED ORDER — EPHEDRINE 5 MG/ML INJ
INTRAVENOUS | Status: AC
  Filled 2023-12-31: qty 5

## 2023-12-31 MED ORDER — OXYCODONE HCL 5 MG PO TABS: 5.0000 mg | ORAL_TABLET | Freq: Once | ORAL | Status: DC | PRN

## 2023-12-31 MED ORDER — PHENYLEPHRINE 80 MCG/ML (10ML) SYRINGE FOR IV PUSH (FOR BLOOD PRESSURE SUPPORT)
PREFILLED_SYRINGE | INTRAVENOUS | Status: AC
  Filled 2023-12-31: qty 10

## 2023-12-31 MED ORDER — POVIDONE-IODINE 10 % EX SWAB: 2.0000 | Freq: Once | CUTANEOUS | Status: DC

## 2023-12-31 MED ORDER — LIDOCAINE HCL (PF) 2 % IJ SOLN
INTRAMUSCULAR | Status: AC
  Filled 2023-12-31: qty 5

## 2023-12-31 MED ORDER — ACETAMINOPHEN 325 MG PO TABS
325.0000 mg | ORAL_TABLET | Freq: Four times a day (QID) | ORAL | Status: DC | PRN
  Filled 2023-12-31: qty 2

## 2023-12-31 MED ORDER — ONDANSETRON HCL 4 MG/2ML IJ SOLN: 4.0000 mg | Freq: Once | INTRAMUSCULAR | Status: DC | PRN

## 2023-12-31 MED ORDER — ORAL CARE MOUTH RINSE: 15.0000 mL | Freq: Once | OROMUCOSAL | Status: AC

## 2023-12-31 MED ORDER — ATORVASTATIN CALCIUM 20 MG PO TABS
20.0000 mg | ORAL_TABLET | Freq: Every day | ORAL | Status: DC
  Administered 2023-12-31: 20 mg via ORAL
  Filled 2023-12-31: qty 1

## 2023-12-31 MED ORDER — FENTANYL CITRATE (PF) 100 MCG/2ML IJ SOLN
INTRAMUSCULAR | Status: DC | PRN
  Administered 2023-12-31: 25 ug via INTRAVENOUS
  Administered 2023-12-31: 50 ug via INTRAVENOUS
  Administered 2023-12-31 (×2): 12.5 ug via INTRAVENOUS

## 2023-12-31 MED ORDER — ACETAMINOPHEN 325 MG PO TABS: 325.0000 mg | ORAL_TABLET | Freq: Four times a day (QID) | ORAL | Status: DC | PRN

## 2023-12-31 MED ORDER — METOCLOPRAMIDE HCL 5 MG/ML IJ SOLN
5.0000 mg | Freq: Three times a day (TID) | INTRAMUSCULAR | Status: DC | PRN
  Administered 2023-12-31: 10 mg via INTRAVENOUS
  Filled 2023-12-31: qty 2

## 2023-12-31 MED ORDER — CHLORHEXIDINE GLUCONATE 0.12 % MT SOLN
15.0000 mL | Freq: Once | OROMUCOSAL | Status: AC
  Administered 2023-12-31: 15 mL via OROMUCOSAL

## 2023-12-31 MED ORDER — METHOCARBAMOL 1000 MG/10ML IJ SOLN: 500.0000 mg | Freq: Four times a day (QID) | INTRAMUSCULAR | Status: DC | PRN

## 2023-12-31 MED ORDER — CEFAZOLIN SODIUM-DEXTROSE 2-4 GM/100ML-% IV SOLN
2.0000 g | Freq: Four times a day (QID) | INTRAVENOUS | Status: DC
  Administered 2023-12-31: 2 g via INTRAVENOUS
  Filled 2023-12-31: qty 100

## 2023-12-31 MED ORDER — ONDANSETRON HCL 4 MG PO TABS: 4.0000 mg | ORAL_TABLET | Freq: Four times a day (QID) | ORAL | Status: DC | PRN

## 2023-12-31 MED ORDER — LIDOCAINE HCL (CARDIAC) PF 100 MG/5ML IV SOSY
PREFILLED_SYRINGE | INTRAVENOUS | Status: DC | PRN
  Administered 2023-12-31: 30 mg via INTRAVENOUS

## 2023-12-31 MED ORDER — ONDANSETRON HCL 4 MG/2ML IJ SOLN
INTRAMUSCULAR | Status: AC
  Filled 2023-12-31: qty 2

## 2023-12-31 MED ORDER — HYDROMORPHONE HCL 1 MG/ML IJ SOLN: 0.5000 mg | INTRAMUSCULAR | Status: DC | PRN

## 2023-12-31 MED ORDER — FENTANYL CITRATE (PF) 100 MCG/2ML IJ SOLN
INTRAMUSCULAR | Status: AC
  Filled 2023-12-31: qty 2

## 2023-12-31 MED ORDER — 0.9 % SODIUM CHLORIDE (POUR BTL) OPTIME
TOPICAL | Status: DC | PRN
  Administered 2023-12-31: 1000 mL

## 2023-12-31 MED ORDER — SODIUM CHLORIDE 0.9 % IR SOLN
Status: DC | PRN
  Administered 2023-12-31: 1000 mL

## 2023-12-31 MED ORDER — PHENYLEPHRINE HCL (PRESSORS) 10 MG/ML IV SOLN
INTRAVENOUS | Status: DC | PRN
Start: 2023-12-31 — End: 2023-12-31
  Administered 2023-12-31 (×2): 80 ug via INTRAVENOUS

## 2023-12-31 MED ORDER — STERILE WATER FOR IRRIGATION IR SOLN
Status: DC | PRN
  Administered 2023-12-31: 2000 mL

## 2023-12-31 MED ORDER — OLOPATADINE HCL 0.1 % OP SOLN
1.0000 [drp] | Freq: Every day | OPHTHALMIC | Status: DC
  Filled 2023-12-31: qty 5

## 2023-12-31 MED ORDER — SODIUM CHLORIDE 0.9 % IV SOLN: INTRAVENOUS | Status: DC

## 2023-12-31 MED ORDER — MENTHOL 3 MG MT LOZG: 1.0000 | LOZENGE | OROMUCOSAL | Status: DC | PRN

## 2023-12-31 MED ORDER — PROPOFOL 1000 MG/100ML IV EMUL
INTRAVENOUS | Status: AC
  Filled 2023-12-31: qty 100

## 2023-12-31 MED ORDER — PHENYLEPHRINE HCL-NACL 20-0.9 MG/250ML-% IV SOLN
INTRAVENOUS | Status: DC | PRN
  Administered 2023-12-31: 40 ug/min via INTRAVENOUS

## 2023-12-31 MED ORDER — VITAMIN D 25 MCG (1000 UNIT) PO TABS
1000.0000 [IU] | ORAL_TABLET | Freq: Every day | ORAL | Status: DC
  Administered 2023-12-31: 1000 [IU] via ORAL
  Filled 2023-12-31: qty 1

## 2023-12-31 MED ORDER — PANTOPRAZOLE SODIUM 40 MG PO TBEC
40.0000 mg | DELAYED_RELEASE_TABLET | Freq: Every day | ORAL | Status: DC
  Administered 2023-12-31: 40 mg via ORAL
  Filled 2023-12-31: qty 1

## 2023-12-31 MED ORDER — TRANEXAMIC ACID-NACL 1000-0.7 MG/100ML-% IV SOLN
1000.0000 mg | INTRAVENOUS | Status: AC
  Administered 2023-12-31: 1000 mg via INTRAVENOUS
  Filled 2023-12-31: qty 100

## 2023-12-31 MED ORDER — ALUM & MAG HYDROXIDE-SIMETH 200-200-20 MG/5ML PO SUSP
30.0000 mL | ORAL | Status: DC | PRN
  Administered 2023-12-31: 30 mL via ORAL
  Filled 2023-12-31: qty 30

## 2023-12-31 MED ORDER — PHENOL 1.4 % MT LIQD
1.0000 | OROMUCOSAL | Status: DC | PRN
Start: 2023-12-31 — End: 2024-01-01

## 2023-12-31 MED ORDER — HYDROXYUREA 500 MG PO CAPS: 1000.0000 mg | ORAL_CAPSULE | ORAL | Status: DC

## 2023-12-31 MED ORDER — MEPERIDINE HCL 25 MG/ML IJ SOLN: 6.2500 mg | INTRAMUSCULAR | Status: DC | PRN

## 2023-12-31 MED ORDER — VITAMIN B-12 1000 MCG PO TABS
500.0000 ug | ORAL_TABLET | Freq: Every day | ORAL | Status: DC
  Administered 2023-12-31: 500 ug via ORAL
  Filled 2023-12-31: qty 1

## 2023-12-31 MED ORDER — METOCLOPRAMIDE HCL 5 MG PO TABS: 5.0000 mg | ORAL_TABLET | Freq: Three times a day (TID) | ORAL | Status: DC | PRN

## 2023-12-31 MED ORDER — OXYCODONE HCL 5 MG PO TABS
5.0000 mg | ORAL_TABLET | ORAL | Status: DC | PRN
  Administered 2023-12-31 (×2): 5 mg via ORAL
  Filled 2023-12-31 (×2): qty 1

## 2023-12-31 MED ORDER — BUPIVACAINE IN DEXTROSE 0.75-8.25 % IT SOLN
INTRATHECAL | Status: DC | PRN
  Administered 2023-12-31: 1.4 mL via INTRATHECAL

## 2023-12-31 MED ORDER — HYDROXYUREA 500 MG PO CAPS: 1500.0000 mg | ORAL_CAPSULE | ORAL | Status: DC

## 2023-12-31 MED ORDER — CEFAZOLIN SODIUM-DEXTROSE 2-4 GM/100ML-% IV SOLN
2.0000 g | INTRAVENOUS | Status: AC
  Administered 2023-12-31: 2 g via INTRAVENOUS
  Filled 2023-12-31: qty 100

## 2023-12-31 MED ORDER — ALBUMIN HUMAN 5 % IV SOLN: INTRAVENOUS | Status: DC | PRN

## 2023-12-31 MED ORDER — PHENYLEPHRINE HCL-NACL 20-0.9 MG/250ML-% IV SOLN
INTRAVENOUS | Status: AC
  Filled 2023-12-31: qty 250

## 2023-12-31 MED ORDER — OXYCODONE HCL 5 MG/5ML PO SOLN: 5.0000 mg | Freq: Once | ORAL | Status: DC | PRN

## 2023-12-31 MED ORDER — DEXAMETHASONE SODIUM PHOSPHATE 10 MG/ML IJ SOLN
INTRAMUSCULAR | Status: DC | PRN
  Administered 2023-12-31: 4 mg via INTRAVENOUS

## 2023-12-31 MED ORDER — LACTATED RINGERS IV SOLN: INTRAVENOUS | Status: DC

## 2023-12-31 MED ORDER — DIPHENHYDRAMINE HCL 12.5 MG/5ML PO ELIX: 12.5000 mg | ORAL_SOLUTION | ORAL | Status: DC | PRN

## 2023-12-31 MED ORDER — HYDROXYUREA 500 MG PO CAPS
1500.0000 mg | ORAL_CAPSULE | ORAL | Status: DC
  Administered 2023-12-31: 1500 mg via ORAL
  Filled 2023-12-31: qty 3

## 2023-12-31 MED ORDER — OXYCODONE HCL 5 MG PO TABS: 10.0000 mg | ORAL_TABLET | ORAL | Status: DC | PRN

## 2023-12-31 MED ORDER — METHOCARBAMOL 500 MG PO TABS: 500.0000 mg | ORAL_TABLET | Freq: Four times a day (QID) | ORAL | Status: DC | PRN

## 2023-12-31 MED ORDER — DEXAMETHASONE SODIUM PHOSPHATE 10 MG/ML IJ SOLN
INTRAMUSCULAR | Status: AC
  Filled 2023-12-31: qty 1

## 2023-12-31 MED ORDER — FENTANYL CITRATE PF 50 MCG/ML IJ SOSY: 25.0000 ug | PREFILLED_SYRINGE | INTRAMUSCULAR | Status: DC | PRN

## 2023-12-31 MED FILL — Medication: Qty: 1 | Status: AC

## 2023-12-31 SURGICAL SUPPLY — 37 items
BAG COUNTER SPONGE SURGICOUNT (BAG) ×1 IMPLANT
BAG ZIPLOCK 12X15 (MISCELLANEOUS) IMPLANT
BENZOIN TINCTURE PRP APPL 2/3 (GAUZE/BANDAGES/DRESSINGS) IMPLANT
BLADE SAW SGTL 18X1.27X75 (BLADE) ×1 IMPLANT
COVER PERINEAL POST (MISCELLANEOUS) ×1 IMPLANT
COVER SURGICAL LIGHT HANDLE (MISCELLANEOUS) ×1 IMPLANT
DRAPE FOOT SWITCH (DRAPES) ×1 IMPLANT
DRAPE STERI IOBAN 125X83 (DRAPES) ×1 IMPLANT
DRAPE U-SHAPE 47X51 STRL (DRAPES) ×2 IMPLANT
DRSG AQUACEL AG ADV 3.5X10 (GAUZE/BANDAGES/DRESSINGS) ×1 IMPLANT
DURAPREP 26ML APPLICATOR (WOUND CARE) ×1 IMPLANT
ELECT PENCIL ROCKER SW 15FT (MISCELLANEOUS) ×1 IMPLANT
ELECT REM PT RETURN 15FT ADLT (MISCELLANEOUS) ×1 IMPLANT
FEMORAL STEM 12/14 TPR SZ4 HIP (Orthopedic Implant) IMPLANT
GAUZE XEROFORM 1X8 LF (GAUZE/BANDAGES/DRESSINGS) IMPLANT
GLOVE BIO SURGEON STRL SZ7.5 (GLOVE) ×1 IMPLANT
GLOVE BIOGEL PI IND STRL 8 (GLOVE) ×2 IMPLANT
GLOVE ECLIPSE 8.0 STRL XLNG CF (GLOVE) ×1 IMPLANT
GOWN STRL REUS W/ TWL XL LVL3 (GOWN DISPOSABLE) ×2 IMPLANT
HEAD ARTICULEZE (Hips) IMPLANT
HOLDER FOLEY CATH W/STRAP (MISCELLANEOUS) ×1 IMPLANT
KIT TURNOVER KIT A (KITS) ×1 IMPLANT
LINER NEUTRAL 52X36MM PLUS 4 (Liner) IMPLANT
PACK ANTERIOR HIP CUSTOM (KITS) ×1 IMPLANT
PIN SECTOR W/GRIP ACE CUP 52MM (Hips) IMPLANT
SCREW 6.5MMX30MM (Screw) IMPLANT
SET HNDPC FAN SPRY TIP SCT (DISPOSABLE) ×1 IMPLANT
STAPLER SKIN PROX 35W (STAPLE) IMPLANT
STRIP CLOSURE SKIN 1/2X4 (GAUZE/BANDAGES/DRESSINGS) IMPLANT
SUT ETHIBOND NAB CT1 #1 30IN (SUTURE) ×1 IMPLANT
SUT ETHILON 2 0 PS N (SUTURE) IMPLANT
SUT MNCRL AB 4-0 PS2 18 (SUTURE) IMPLANT
SUT VIC AB 0 CT1 36 (SUTURE) ×1 IMPLANT
SUT VIC AB 1 CT1 36 (SUTURE) ×1 IMPLANT
SUT VIC AB 2-0 CT1 TAPERPNT 27 (SUTURE) ×2 IMPLANT
TRAY FOLEY MTR SLVR 16FR STAT (SET/KITS/TRAYS/PACK) IMPLANT
YANKAUER SUCT BULB TIP NO VENT (SUCTIONS) ×1 IMPLANT

## 2024-01-01 ENCOUNTER — Encounter (HOSPITAL_COMMUNITY): Payer: Self-pay | Admitting: Orthopaedic Surgery

## 2024-01-11 ENCOUNTER — Ambulatory Visit: Admitting: Hematology

## 2024-01-11 ENCOUNTER — Other Ambulatory Visit

## 2024-01-14 ENCOUNTER — Encounter: Admitting: Orthopaedic Surgery

## 2024-01-19 ENCOUNTER — Encounter: Admitting: Physical Medicine and Rehabilitation

## 2024-01-25 NOTE — Interval H&P Note (Signed)
 History and Physical Interval Note: The patient understands that we are proceeding to surgery today for a left total hip replacement to treat her significant left hip avascular necrosis and hip pain.  There has been no acute or interval change in her medical status.  The risks and benefits of surgery have been discussed in detail and informed consent has been obtained.  The left operative hip has been marked.  01/06/2024 7:18 AM  [name]  has presented today for surgery, with the diagnosis of avascular necrosis left hip.  The various methods of treatment have been discussed with the patient and family. After consideration of risks, benefits and other options for treatment, the patient has consented to  Procedure(s): ARTHROPLASTY, HIP, TOTAL, ANTERIOR APPROACH (Left) as a surgical intervention.  The patient's history has been reviewed, patient examined, no change in status, stable for surgery.  I have reviewed the patient's chart and labs.  Questions were answered to the patient's satisfaction.     [name]

## 2024-01-25 NOTE — Progress Notes (Signed)
 Patient ID: [name], female   DOB: [birth year], 83 y.o.   MRN: [id number] I am currently at the bedside now.  Unfortunately the patient had a CODE BLUE called this afternoon and in spite of valiant efforts by the medical staff, she did not survive the code.  Her surgery was not complicated this morning from her hip replacement.  We found the hip completely dissolved away in terms of the femoral head.  The blood loss was normal for this type of surgery and this was done under spinal anesthesia.  She tolerated the surgery very well without any complications.  I then actually came and saw her late this morning before my afternoon clinic with family at the bedside and she was doing well.  She is then later set up with physical therapy.  She said that she did not feel great but was not complaining of chest pain with only complaining of a mild headache.  She had only had apparently a single dose of pain medicine sometime but not any large amount and had been a little nauseous.  Staff had spoke to her and there was no red flags.  They then came in the room and found her apneic and a code was called.  The code team responded quickly and according to the code team she had several runs of V. tach but they could not get her back.  Family is appropriately in shock at the bedside and I will be here with them for a while.  Preoperatively, she was slightly anemic and has had chronically high platelets and that was really her only significant issues prior to surgery.  She is someone who had gone from walking pretty normally to almost essentially no ambulation with a hip that had significantly worsened and basically melted away over the last 6 weeks giving her very limited and poor mobility and severe pain.

## 2024-01-25 NOTE — Progress Notes (Signed)
   01/20/2024 1900  Spiritual Encounters  Reason for visit Patient death  Spiritual Framework  Presenting Themes Rituals and practive;Courage hope and growth;Community and relationships   Daighter of patient , [name], who is an employeee here at [employer] - was greeted by her EV Supervisor for a visit with gathered family,.

## 2024-01-25 NOTE — Anesthesia Procedure Notes (Signed)
 Spinal  Patient location during procedure: OR Start time: 01/07/2024 7:42 AM End time: 01/24/2024 7:50 AM Reason for block: surgical anesthesia Staffing Anesthesiologist: Mallory Manus, MD Performed by: Mallory Manus, MD Authorized by: Mallory Manus, MD   Preanesthetic Checklist Completed: patient identified, IV checked, site marked, risks and benefits discussed, surgical consent, monitors and equipment checked, pre-op evaluation and timeout performed Spinal Block Patient position: sitting Prep: DuraPrep Patient monitoring: heart rate, cardiac monitor, continuous pulse ox and blood pressure Approach: midline Location: L2-3 Injection technique: single-shot Needle Needle type: Sprotte  Needle gauge: 24 G Needle length: 9 cm Assessment Sensory level: T4 Events: CSF return

## 2024-01-25 NOTE — Progress Notes (Signed)
   01/10/2024 1800  Spiritual Encounters  Type of Visit Follow up  Care provided to: Pt and family  Conversation partners present during encounter Nurse  Referral source Family;Chaplain team  Reason for visit Patient death  OnCall Visit Yes  Spiritual Framework  Presenting Themes Rituals and practive;Courage hope and growth  Community/Connection Family;Faith community;Spiritual leader  Family Stress Factors Loss of control;Major life changes  Interventions  Spiritual Care Interventions Made Established relationship of care and support;Compassionate presence;Reflective listening;Normalization of emotions;Reconciliation with self/others;Narrative/life review;Explored values/beliefs/practices/strengths;Meaning making;Bereavement/grief support;Prayer;Supported grief process;Provided grief education;Encouragement  Intervention Outcomes  Outcomes Connection to spiritual care;Awareness around self/spiritual resourses;Connection to values and goals of care;Awareness of health;Awareness of support;Patient family open to resources;Connected to spiritual community  Spiritual Care Plan  Spiritual Care Issues Still Outstanding Chaplain will continue to follow;No further spiritual care needs at this time (see row info)   Chaplain was called to bedside of deceased patient - Family began to gather and eventually had over 60 individuals who came.  Assisted in attending to their grief and bereavement.  One elderly sister of patient was taken to ED for surveillance.  Spent 1+ hour with family.  Prcessed grief and bereavement issues,

## 2024-01-25 NOTE — ED Provider Notes (Deleted)
 Department of Emergency Medicine CODE BLUE CONSULTATION    CONSULT NOTE  Chief Complaint: Cardiac arrest/unresponsive   Level V Caveat: Unresponsive  History of present illness: I was contacted by the hospital for a CODE BLUE cardiac arrest upstairs and presented to the patient's bedside.   Patient had orthopedic surgery this morning, was doing well following surgery and then became unresponsive following physical therapy.  ACLS was in process upon my arrival  ROS: Unable to obtain, Level V caveat  Scheduled Meds: Continuous Infusions: PRN Meds:. Past Medical History:  Diagnosis Date   Anemia    Arthritis    Cervical stenosis of spine    Dysphasia    Essential hypertension, benign    GERD (gastroesophageal reflux disease)    Glaucoma    Gout    Headache    Heart murmur    History of estrogen therapy    Hypercholesteremia    IBS (irritable bowel syndrome)    Mitral regurgitation    Pre-diabetes    Pulmonary air embolism (HCC)    states she had this wiht her back surgery   Rheumatoid arthritis (HCC)    Seborrhea    patient denies   Past Surgical History:  Procedure Laterality Date   ANTERIOR AND POSTERIOR REPAIR WITH SACROSPINOUS FIXATION N/A 01/08/2015   Procedure: ANTERIOR AND POSTERIOR REPAIR WITH possible SACROSPINOUS FIXATION;  Surgeon: Charlie Croak, MD;  Location: [facility];  Service: Gynecology;  Laterality: N/A;   BACK SURGERY     CATARACT EXTRACTION W/ INTRAOCULAR LENS  IMPLANT, BILATERAL Bilateral    COLONOSCOPY     EYE SURGERY     LAPAROSCOPIC ASSISTED VAGINAL HYSTERECTOMY N/A 01/08/2015   Procedure: LAPAROSCOPIC ASSISTED VAGINAL HYSTERECTOMY;  Surgeon: Charlie Croak, MD;  Location: [facility];  Service: Gynecology;  Laterality: N/A;   LUMBAR FUSION  05/2010   REVERSE SHOULDER ARTHROPLASTY Left 02/16/2014   Procedure: LEFT REVERSE TOTAL SHOULDER ARTHROPLASTY;  Surgeon: Franky CHRISTELLA Pointer, MD;  Location: [facility];  Service: Orthopedics;  Laterality: Left;    REVERSE SHOULDER ARTHROPLASTY Right 08/07/2016   REVERSE SHOULDER ARTHROPLASTY Right 08/07/2016   Procedure: REVERSE RIGHT SHOULDER ARTHROPLASTY;  Surgeon: Franky Pointer, MD;  Location: [facility];  Service: Orthopedics;  Laterality: Right;   TOE SURGERY     TUBAL LIGATION     Social History   Socioeconomic History   Marital status: Widowed    Spouse name: Not on file   Number of children: 9   Years of education: 7   Highest education level: Not on file  Occupational History   Not on file  Tobacco Use   Smoking status: Never   Smokeless tobacco: Never  Vaping Use   Vaping status: Never Used  Substance and Sexual Activity   Alcohol use: No   Drug use: No   Sexual activity: Never  Other Topics Concern   Not on file  Social History Narrative   Lives with daughter   Caffeine use: none      Social history: She is retired.  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  Does not smoke or consume alcohol.  She is a widow.       Family history: Mother died of an MI with history of hypertension who was 85 years of age at the time of her event.  1 brother with history of adult onset diabetes, 5 sisters-one of them has diabetes.  1 daughter died of an  occult GI malignancy.  1 brother in good health.  She has 9 children.  1 daughter with history of pulmonary sarcoidosis years ago that resolved.   Social Drivers of Corporate investment banker Strain: Low Risk  (03/03/2023)   Overall Financial Resource Strain (CARDIA)    Difficulty of Paying Living Expenses: Not hard at all  Food Insecurity: No Food Insecurity (01/09/2024)   Hunger Vital Sign    Worried About Running Out of Food in the Last Year: Never true    Ran Out of Food in the Last Year: Never true  Transportation Needs: No Transportation Needs (01/05/2024)   PRAPARE - Administrator, Civil Service (Medical): No    Lack of Transportation (Non-Medical): No  Physical  Activity: Insufficiently Active (03/03/2023)   Exercise Vital Sign    Days of Exercise per Week: 7 days    Minutes of Exercise per Session: 10 min  Stress: No Stress Concern Present (03/03/2023)   [employer] of Occupational Health - Occupational Stress Questionnaire    Feeling of Stress : Not at all  Social Connections: Moderately Integrated (01/10/2024)   Social Connection and Isolation Panel    Frequency of Communication with Friends and Family: More than three times a week    Frequency of Social Gatherings with Friends and Family: More than three times a week    Attends Religious Services: More than 4 times per year    Active Member of [employer] or Organizations: Yes    Attends Banker Meetings: More than 4 times per year    Marital Status: Widowed  Intimate Partner Violence: Not At Risk (01/15/2024)   Humiliation, Afraid, Rape, and Kick questionnaire    Fear of Current or Ex-Partner: No    Emotionally Abused: No    Physically Abused: No    Sexually Abused: No   Allergies  Allergen Reactions   Alendronate    Allopurinol Swelling   Amoxicillin-Pot Clavulanate Diarrhea and Itching    PATIENT DOES NOT RECALL ALLERGIC REACTION TO AUGMENTIN.  Has patient had a PCN reaction causing immediate rash, facial/tongue/throat swelling, SOB or lightheadedness with hypotension: UNKNOWN Has patient had a PCN reaction causing severe rash involving mucus membranes or skin necrosis: UNKNOWN Has patient had a PCN reaction that required hospitalization No Has patient had a PCN reaction occurring within the last 10 years: No If all of the above answers are NO, then may proceed    Folic Acid     Methotrexate Derivatives    Latex Itching    Latex gloves    Tramadol  Nausea And Vomiting    Last set of Vital Signs (not current) Vitals:   11/13/23 0906 11/13/23 1223  BP: (!) 169/69 (!) 160/77  Pulse: 75 77  Resp: 20 20  Temp: 98.2 F (36.8 C) 98.2 F (36.8 C)  SpO2: 97% 98%    Physical Exam Gen: unresponsive Cardiovascular: pulseless  Resp: apneic. Breath sounds equal bilaterally with bagging  Abd: nondistended  Neuro: GCS 3, unresponsive to pain  HEENT: No blood in posterior pharynx, gag reflex absent  Neck: No crepitus  Musculoskeletal: No deformity  Skin: warm  Procedure Name: Intubation Date/Time: 01/05/2024 5:13 PM  Performed by: Elnor Jayson LABOR, DOPre-anesthesia Checklist: Emergency Drugs available, Timeout performed, Patient identified, Patient being monitored and Suction available Oxygen Delivery Method: Ambu bag Preoxygenation: Pre-oxygenation with 100% oxygen Ventilation: Mask ventilation with difficulty Laryngoscope Size: Glidescope and 3 Grade View: Grade I Tube size: 7.5 mm Number of attempts:  1 Placement Confirmation: ETT inserted through vocal cords under direct vision, Positive ETCO2 and Breath sounds checked- equal and bilateral Secured at: 23 cm Tube secured with: ETT holder Dental Injury: Teeth and Oropharynx as per pre-operative assessment  Difficulty Due To: Difficulty was unanticipated      Cardiopulmonary Resuscitation (CPR) Procedure Note  Directed/Performed by: Jayson DELENA Pereyra I personally directed ancillary staff and/or performed CPR in an effort to regain return of spontaneous circulation and to maintain cardiac, neuro and systemic perfusion.  Multiple rounds of v-fib unresponsive to ACLS resuscitation Deteriorated to pea > asystole    Medical Decision making  Recent surgery this morning, patient was given TNK early in the resuscitation.   After approximately 35 minutes of ACLS protocol was performed patient is not demonstrating signs of life, pupils are fixed and dilated bilateral, no carotid or femoral pulse, no spontaneous respirations, no heart sounds present on auscultation, no spontaneous movement.  Asystole noted on ZOLL   Time of death 1640  Family at bedside after resuscitation efforts were  terminated  Primary team also at bedside   Assessment and Plan  Expired   Pereyra Jayson DELENA, DO 01/16/2024 1729

## 2024-01-25 NOTE — Discharge Summary (Addendum)
Patient ID: [name] MRN: [id number] DOB/AGE: 1941-02-08 83 y.o.  Admit date: 01-24-24 Discharge date: 2024/01/24  Admission Diagnoses:  Principal Problem:   Avascular necrosis of hip, left (HCC) Active Problems:   Status post total replacement of left hip   Discharge Diagnoses:  Same  Past Medical History:  Diagnosis Date   Anemia    Arthritis    Cervical stenosis of spine    Dysphasia    Essential hypertension, benign    GERD (gastroesophageal reflux disease)    Glaucoma    Gout    Headache    Heart murmur    History of estrogen therapy    Hypercholesteremia    IBS (irritable bowel syndrome)    Mitral regurgitation    Pre-diabetes    Pulmonary air embolism (HCC)    states she had this wiht her back surgery   Rheumatoid arthritis (HCC)    Seborrhea    patient denies    Surgeries: Procedure(s): ARTHROPLASTY, HIP, TOTAL, ANTERIOR APPROACH on 01-24-2024   Consultants:   Discharged Condition: Improved  Hospital Course: [name] is an 83 y.o. female who was admitted 01/24/24 for operative treatment ofAvascular necrosis of hip, left (HCC). Patient has severe unremitting pain that affects sleep, daily activities, and work/hobbies. After pre-op clearance the patient was taken to the operating room on 24-Jan-2024 and underwent  Procedure(s): ARTHROPLASTY, HIP, TOTAL, ANTERIOR APPROACH.    Patient was given perioperative antibiotics:  Anti-infectives (From admission, onward)    Start     Dose/Rate Route Frequency Ordered Stop   Jan 24, 2024 1330  ceFAZolin  (ANCEF ) IVPB 2g/100 mL premix  Status:  Discontinued        2 g 200 mL/hr over 30 Minutes Intravenous Every 6 hours Jan 24, 2024 1105 01/01/24 0129   January 24, 2024 0600  ceFAZolin  (ANCEF ) IVPB 2g/100 mL premix        2 g 200 mL/hr over 30 Minutes Intravenous On call to O.R. 01-24-24 0544 2024-01-24 0732        Patient was given sequential compression devices, early ambulation, and chemoprophylaxis to prevent DVT.   Inpatient Morphine  Milligram Equivalents Per Day 01/24/24 2024/01/24   Values displayed are in units of MME/Day    Order Start / End Date Yesterday    oxyCODONE  (Oxy IR/ROXICODONE ) immediate release tablet 5 mg Jan 24, 2024 2024-01-24 0 of Unknown    oxyCODONE  (ROXICODONE ) 5 MG/5ML solution 5 mg 01/24/24 2024/01/24 0 of Unknown      Group total: 0 of Unknown    fentaNYL  (SUBLIMAZE ) injection 25-50 mcg Jan 24, 2024 01-24-2024 0 of 45-90    meperidine  (DEMEROL ) injection 6.25-12.5 mg 24-Jan-2024 Jan 24, 2024 0 of 7.5-15    fentaNYL  (SUBLIMAZE ) injection 24-Jan-2024 01/24/2024 *30 of 30    Daily Totals  * 30 of Unknown (at least 82.5-135)  *One-Step medication  Calculation Errors     Order Type Date Details   oxyCODONE  (Oxy IR/ROXICODONE ) immediate release tablet 5 mg Ordered Dose -- Insufficient frequency information   oxyCODONE  (ROXICODONE ) 5 MG/5ML solution 5 mg Ordered Dose -- Insufficient frequency information           Postoperatively the patient was admitted to a regular orthopedic floor bed.  She was seen late in the morning with family at the bedside and was doing well and had tolerated the surgery well with normal vital signs.  In the afternoon therapy did set her up and she was feeling weak so therapy decided appropriately not to push it hard.  The patient had no other complaints.  Sometime later she was found apneic by nursing staff and a CODE BLUE was called.  Attempts were made to resuscitate her and according to the code team she had some runs of V. tach.  Unfortunately they were unable to resuscitate her and she passed away.  She had had significant decrease in her mobility over the last month send no telling if this was from some type of blood clot or other cardiac event.  She had not had any previous chest pain or shortness of breath.    Recent vital signs: No data found.   Recent laboratory studies:  Recent Labs    01-07-24 1111  WBC 7.4  HGB 8.1*  HCT 24.6*  PLT 936*  NA 135  K 3.9  CL 101  CO2 21*  BUN 20  CREATININE 0.70   GLUCOSE 129*  CALCIUM  9.6     Discharge Medications:   Allergies as of 01-07-2024       Reactions   Alendronate    Allopurinol Swelling   Amoxicillin-pot Clavulanate Diarrhea, Itching   PATIENT DOES NOT RECALL ALLERGIC REACTION TO AUGMENTIN. Has patient had a PCN reaction causing immediate rash, facial/tongue/throat swelling, SOB or lightheadedness with hypotension: UNKNOWN Has patient had a PCN reaction causing severe rash involving mucus membranes or skin necrosis: UNKNOWN Has patient had a PCN reaction that required hospitalization No Has patient had a PCN reaction occurring within the last 10 years: No If all of the above answers are NO, then may proceed    Folic Acid     Methotrexate Derivatives    Latex Itching   Latex gloves    Tramadol  Nausea And Vomiting        Medication List     ASK your doctor about these medications    acetaminophen  500 MG tablet Commonly known as: TYLENOL  Take 1,000 mg by mouth every 6 (six) hours as needed for moderate pain (pain score 4-6).   aspirin  81 MG tablet Take 81 mg by mouth daily.   b complex vitamins capsule Take 1 capsule by mouth daily.   calcium  carbonate 500 MG chewable tablet Commonly known as: TUMS - dosed in mg elemental calcium  Chew 1 tablet by mouth 2 (two) times daily as needed for indigestion or heartburn.   cholecalciferol  25 MCG (1000 UNIT) tablet Commonly known as: VITAMIN D3 Take 1,000 Units by mouth daily.   cyanocobalamin  500 MCG tablet Commonly known as: VITAMIN B12 Take 500 mcg by mouth daily.   diphenhydrAMINE  25 MG tablet Commonly known as: BENADRYL  Take 25 mg by mouth every 6 (six) hours as needed for itching or allergies.   ferrous sulfate  325 (65 FE) MG EC tablet Take 325 mg by mouth daily.   omeprazole  20 MG tablet Commonly known as: PRILOSEC OTC Take 20 mg by mouth daily.   Pataday  0.1 % ophthalmic solution Generic drug: olopatadine  Place 1 drop into both eyes daily.   VITAMIN C  PO Take 1 tablet by mouth daily.        Diagnostic Studies: DG HIP UNILAT WITH PELVIS 1V LEFT Result Date: 01/07/2024 CLINICAL DATA:  Provided history: Suture needle broke off during closing. Dr. Vernetta saw image at bedside and removed the needle. EXAM: DG HIP (WITH OR WITHOUT PELVIS) 1V*L* COMPARISON:  None Available. FINDINGS: Two fluoroscopic spot views submitted from the operating room. Left hip arthroplasty is in place. Image time 9:20:04 demonstrates curvilinear needle projecting over the greater trochanter. Image timed 9:30:34  demonstrates the needle is no longer present. IMPRESSION: Intraoperative fluoroscopy during left hip arthroplasty. Curvilinear needle projecting over the greater trochanter, subsequently removed. Electronically Signed   By: Andrea Gasman M.D.   On: 01/25/2024 11:05   DG Pelvis Portable Result Date: Jan 25, 2024 CLINICAL DATA:  [id number] Status post total replacement of left hip [id number] EXAM: PORTABLE PELVIS 1-2 VIEWS COMPARISON:  Preoperative imaging FINDINGS: Left hip arthroplasty in expected alignment. No periprosthetic lucency or fracture. Recent postsurgical change includes air and edema in the soft tissues. Lateral skin staples in place. IMPRESSION: Left hip arthroplasty without immediate postoperative complication. Electronically Signed   By: Andrea Gasman M.D.   On: January 25, 2024 11:02   DG HIP UNILAT WITH PELVIS 1V LEFT Result Date: January 25, 2024 CLINICAL DATA:  Elective surgery. EXAM: DG HIP (WITH OR WITHOUT PELVIS) 1V*L* COMPARISON:  Radiograph 12/07/2023 FINDINGS: Four fluoroscopic spot views of the pelvis and left hip obtained in the operating room. Sequential images during hip arthroplasty. Fluoroscopy time 24 seconds. Dose 2.7 mGy. IMPRESSION: Intraoperative fluoroscopy during left hip arthroplasty. Electronically Signed   By: Andrea Gasman M.D.   On: January 25, 2024 11:02   DG C-Arm 1-60 Min-No Report Result Date: 2024/01/25 Fluoroscopy was utilized by the  requesting physician.  No radiographic interpretation.   DG C-Arm 1-60 Min-No Report Result Date: 2024/01/25 Fluoroscopy was utilized by the requesting physician.  No radiographic interpretation.   DG C-Arm 1-60 Min-No Report Result Date: Jan 25, 2024 Fluoroscopy was utilized by the requesting physician.  No radiographic interpretation.   XR HIP UNILAT W OR W/O PELVIS 2-3 VIEWS LEFT Result Date: 12/07/2023 AP pelvis lateral view left hip: Rami fracture with the consolidation on the left.  Left femoral head has completely dissolved since 10/28/2023 secondary to avascular necrosis.  No other acute findings.   Disposition: Deceased       Signed: [name] 01/01/2024, 4:58 PM

## 2024-01-25 NOTE — Anesthesia Preprocedure Evaluation (Addendum)
 Anesthesia Evaluation  Patient identified by MRN, date of birth, ID band Patient awake    Reviewed: Allergy & Precautions, H&P , NPO status , Patient's Chart, lab work & pertinent test results  Airway Mallampati: II  TM Distance: >3 FB Neck ROM: Full    Dental no notable dental hx. (+) Dental Advisory Given, Edentulous Upper, Poor Dentition, Missing   Pulmonary neg pulmonary ROS   Pulmonary exam normal breath sounds clear to auscultation       Cardiovascular Exercise Tolerance: Good hypertension, Pt. on medications  Rhythm:Regular Rate:Normal     Neuro/Psych negative neurological ROS  negative psych ROS   GI/Hepatic Neg liver ROS,GERD  Medicated and Controlled,,  Endo/Other  diabetes    Renal/GU Renal InsufficiencyRenal disease  negative genitourinary   Musculoskeletal  (+) Arthritis , Rheumatoid disorders,    Abdominal   Peds  Hematology  (+) Blood dyscrasia, anemia   Anesthesia Other Findings Lumbar spine  IMPRESSION: 1. Progression of degenerative disc disease at L3-4 since prior exam. 2. Intact L4 through S1 lumbar fusion. 3. Transitional lumbosacral anatomy.     Reproductive/Obstetrics negative OB ROS                              Anesthesia Physical Anesthesia Plan  ASA: 3  Anesthesia Plan: MAC and Spinal   Post-op Pain Management: Minimal or no pain anticipated   Induction: Intravenous  PONV Risk Score and Plan: 2 and Ondansetron , Propofol  infusion and Treatment may vary due to age or medical condition  Airway Management Planned: Natural Airway and Simple Face Mask  Additional Equipment: None  Intra-op Plan:   Post-operative Plan:   Informed Consent: I have reviewed the patients History and Physical, chart, labs and discussed the procedure including the risks, benefits and alternatives for the proposed anesthesia with the patient or authorized representative who  has indicated his/her understanding and acceptance.     Dental advisory given  Plan Discussed with: CRNA and Anesthesiologist  Anesthesia Plan Comments: (DISCUSSION: [name] is an 83 yo female with PMH of HTN, hx of PE (2012), GERD, prediabetes, anemia, essential thrombocytosis, RA, cervical stenosis, s/p lumbar fusion L4-S1 (2012).   Seen by PCP on 11/17/23 for severe L hip pain. Referred to Ortho and now scheduled for hip replacement.   Patient follows with Hematology for thrombocytosis. Last seen on 10/09/23 by Dr. Onesimo. Takes Hydroxyurea . Per Dr. 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   Hx of RA and follows with Rheum.    Hx of PE after lumbar fusion in 2012. Echo 06/15/2010:   Study Conclusions   -LV: There was moderate concentric hypertrophy. Systolic function was vigorous. The estimated EF was in the range of 65-70%. The study is not technically sufficient to allow evaluation of the LV diastolic function. -Pulmonary arteries: Systolic pressure was mildly increased. PA peak pressure: 48 mmHg -Aortic valve: Poorly visualized. Doppler: There was no stenosis. No regurgitation. -Mitral valve: Poorly visualized. Structurally normal valve. Leaflet separation was normal. Doppler: Transvalvular velocity was within the normal range. There was no evidence for stenosis. No regurgitation.       )         Anesthesia Quick Evaluation

## 2024-01-25 NOTE — Plan of Care (Signed)
  Problem: Activity: Goal: Risk for activity intolerance will decrease Outcome: Progressing   Problem: Safety: Goal: Ability to remain free from injury will improve Outcome: Progressing   Problem: Pain Managment: Goal: General experience of comfort will improve and/or be controlled Outcome: Progressing

## 2024-01-25 NOTE — Transfer of Care (Signed)
 Immediate Anesthesia Transfer of Care Note  Patient: [name]  Procedure(s) Performed: ARTHROPLASTY, HIP, TOTAL, ANTERIOR APPROACH (Left: Hip)  Patient Location: PACU  Anesthesia Type:Spinal  Level of Consciousness: awake, alert , oriented, and patient cooperative  Airway & Oxygen Therapy: Patient Spontanous Breathing and Patient connected to face mask oxygen  Post-op Assessment: Report given to RN and Post -op Vital signs reviewed and stable  Post vital signs: Reviewed and stable  Last Vitals:  Vitals Value Taken Time  BP 111/51 01/16/2024 09:57  Temp    Pulse 74 01/18/2024 10:00  Resp 13 01/19/2024 10:00  SpO2 100 % 01/08/2024 10:00  Vitals shown include unfiled device data.  Last Pain:  Vitals:   01/13/2024 0610  TempSrc:   PainSc: 9          Complications: No notable events documented.

## 2024-01-25 NOTE — Evaluation (Addendum)
 Physical Therapy Evaluation Patient Details Name: [name] MRN: [id number] DOB: 1940/08/29 Today's Date: 01/08/2024  History of Present Illness  83 yo female s/p  L THA, duirect anterior on 01/22/2024. EFY:mzczmdz TSA,r arthritis, cervical stenosis of spine, dysphasia, essential hypertension (benign), GERD, Glaucoma, heart murmur, history of estrogen therapy, hypercholesterolemia, gout,IBS, mitral regurgitation, pulmonary air embolism, rhematoid arthritis, seborrhea  Clinical Impression  Pt admitted with above diagnosis.  Pt currently with functional limitations due to the deficits listed below (see PT Problem List). Pt will benefit from acute skilled PT to increase their independence and safety with mobility to allow discharge.     The patient reports feeling sick, reportedly has been nauseated. Patient willing to sit up. Assisted to sitting onto bed edge,restless when seated, head down. Patient sat x ~ 4 minutes before assisted back into bed. Repsitioned, changed gown as it was wet prom perspiration. Patient remained  somnolent after settled in the bed.  Continue PT for mobility.      If plan is discharge home, recommend the following: A little help with walking and/or transfers;A little help with bathing/dressing/bathroom;Assistance with cooking/housework;Help with stairs or ramp for entrance;Assist for transportation   Can travel by private vehicle        Equipment Recommendations None recommended by PT  Recommendations for Other Services       Functional Status Assessment Patient has had a recent decline in their functional status and demonstrates the ability to make significant improvements in function in a reasonable and predictable amount of time.     Precautions / Restrictions Precautions Precautions: Fall Restrictions Weight Bearing Restrictions Per Provider Order: No      Mobility  Bed Mobility Overal bed mobility: Needs Assistance Bed Mobility: Rolling, Supine to  Sit, Sit to Supine Rolling: Mod assist   Supine to sit: Mod assist Sit to supine: Mod assist   General bed mobility comments: assisted positioning LLE to roll to each side,  patient assisted Upper body to sitting with assist for legs to bed edge.. Only sat  at bed edge x ~ 4 minutes, indicating feeling sick, eyes closed, head slumped, patient was awake but appeared to be  getting weaker, assisted legs back into bed.    Transfers                   General transfer comment: unable to test    Ambulation/Gait                  Stairs            Wheelchair Mobility     Tilt Bed    Modified Rankin (Stroke Patients Only)       Balance Overall balance assessment: Needs assistance Sitting-balance support: Feet supported, Bilateral upper extremity supported Sitting balance-Leahy Scale: Poor Sitting balance - Comments: figets, head forward, leaning to right.                                     Pertinent Vitals/Pain Pain Assessment Pain Assessment: Faces Faces Pain Scale: Hurts even more Pain Location: left hip Pain Descriptors / Indicators: Discomfort, Moaning Pain Intervention(s): Monitored during session, Premedicated before session, Limited activity within patient's tolerance, Ice applied, Repositioned    Home Living Family/patient expects to be discharged to:: Private residence Living Arrangements: Children Available Help at Discharge: Family Type of Home: House Home Access: Stairs to enter Entrance Stairs-Rails: Furniture conservator/restorer  Stairs-Number of Steps: 4   Home Layout: One level Home Equipment: Agricultural consultant (2 wheels);Shower seat;Cane - single point Additional Comments: did not get clear picture as patient was feeling badly and not answering fully    Prior Function               Mobility Comments: has been using RW ADLs Comments: unsure     Extremity/Trunk Assessment   Upper Extremity Assessment Upper Extremity  Assessment: Overall WFL for tasks assessed    Lower Extremity Assessment Lower Extremity Assessment: LLE deficits/detail LLE Deficits / Details: able to flex hip when supine    Cervical / Trunk Assessment Cervical / Trunk Assessment: Kyphotic  Communication   Communication Communication: No apparent difficulties    Cognition Arousal: Lethargic, Suspect due to medications Behavior During Therapy: Anxious, Restless   PT - Cognitive impairments: Difficult to assess                       PT - Cognition Comments: oriented to having hip surgery, followed directions but quite limited due to feeling so sick after pain meds Following commands: Impaired (due being so restless) Following commands impaired: Follows one step commands inconsistently     Cueing Cueing Techniques: Verbal cues, Gestural cues, Tactile cues     General Comments      Exercises     Assessment/Plan    PT Assessment Patient needs continued PT services  PT Problem List Decreased strength;Decreased knowledge of use of DME;Decreased range of motion;Decreased activity tolerance;Decreased safety awareness;Decreased balance;Decreased knowledge of precautions;Decreased mobility       PT Treatment Interventions DME instruction;Gait training;Stair training;Functional mobility training;Therapeutic activities;Therapeutic exercise;Patient/family education    PT Goals (Current goals can be found in the Care Plan section)  Acute Rehab PT Goals Patient Stated Goal: not stated, feeling too poorly PT Goal Formulation: Patient unable to participate in goal setting Time For Goal Achievement: 01/07/24 Potential to Achieve Goals: Good    Frequency 7X/week     Co-evaluation               AM-PAC PT 6 Clicks Mobility  Outcome Measure Help needed turning from your back to your side while in a flat bed without using bedrails?: A Lot Help needed moving from lying on your back to sitting on the side of a  flat bed without using bedrails?: A Lot Help needed moving to and from a bed to a chair (including a wheelchair)?: Total Help needed standing up from a chair using your arms (e.g., wheelchair or bedside chair)?: Total Help needed to walk in hospital room?: Total Help needed climbing 3-5 steps with a railing? : Total 6 Click Score: 8    End of Session   Activity Tolerance: Patient limited by fatigue;Treatment limited secondary to agitation;Patient limited by pain Patient left: in bed;with call bell/phone within reach;with bed alarm set;with nursing/sitter in room Nurse Communication: Mobility status PT Visit Diagnosis: Unsteadiness on feet (R26.81);Difficulty in walking, not elsewhere classified (R26.2);Pain Pain - Right/Left: Left Pain - part of body: Hip    Time: 8484-8454 PT Time Calculation (min) (ACUTE ONLY): 30 min   Charges:   PT Evaluation $PT Eval Low Complexity: 1 Low PT Treatments $Therapeutic Activity: 8-22 mins PT General Charges $$ ACUTE PT VISIT: 1 Visit        Darice Potters PT Acute Rehabilitation Services Office [phone]   Potters Darice Norris 01/04/2024, 3:49 PM

## 2024-01-25 NOTE — Progress Notes (Signed)
 Provided grief support to family on the very unexpected death of [name].  [name] had several children and grandchildren, sisters, mom and other family members supporting each other and surrounding her with love.  Their pastor is present with them now, as is chaplain [name] who came on-call just now.

## 2024-01-25 NOTE — Anesthesia Postprocedure Evaluation (Signed)
 Anesthesia Post Note  Patient: [name]  Procedure(s) Performed: ARTHROPLASTY, HIP, TOTAL, ANTERIOR APPROACH (Left: Hip)     Patient location during evaluation: PACU Anesthesia Type: MAC and Spinal Level of consciousness: awake and alert Pain management: pain level controlled Vital Signs Assessment: post-procedure vital signs reviewed and stable Respiratory status: spontaneous breathing, nonlabored ventilation, respiratory function stable and patient connected to nasal cannula oxygen Cardiovascular status: stable and blood pressure returned to baseline Postop Assessment: no apparent nausea or vomiting Anesthetic complications: no   No notable events documented.  Last Vitals:  Vitals:   01/21/2024 1210 01/22/2024 1306  BP: 107/77 (!) 127/50  Pulse: 63 (!) 55  Resp:    Temp:  (!) 36.4 C  SpO2:  100%    Last Pain:  Vitals:   12/28/2023 1223  TempSrc:   PainSc: 0-No pain                 Jerrico Covello

## 2024-01-25 NOTE — Progress Notes (Signed)
 Patient was noted to be alert and oriented at 1550. NT talked to patient and noticed no issues.  At 1604, nurse noted continuous pulse oximetry beeping and entered room to find the patient with agonal breathing with pulse oximeter reading 0 for O2 saturation and pulse. Code blue was called and CPR initiated. Code team arrived. Family and attending physician notified. No family present at the time of code.   At 1640, CPR stopped and patient pronounced by MD.   Dr. Vernetta and family arrived after code stopped and family given time with patient.

## 2024-01-25 NOTE — Progress Notes (Signed)
 Department of Emergency Medicine CODE BLUE CONSULTATION    CONSULT NOTE  Chief Complaint: Cardiac arrest/unresponsive   Level V Caveat: Unresponsive  History of present illness: I was contacted by the hospital for a CODE BLUE cardiac arrest upstairs and presented to the patient's bedside.   Pt had right hip surgery this AM, no complications per nursing. D/w nursing on arrival, pt had just partially completed some bedside PT and became tired and was brought back to bed. When staff went to check on pt she was unresponsive and code blue was called, ACLS in progress upon my arrival.    ROS: Unable to obtain, Level V caveat  Scheduled Meds: Continuous Infusions: PRN Meds:. Past Medical History:  Diagnosis Date   Anemia    Arthritis    Cervical stenosis of spine    Dysphasia    Essential hypertension, benign    GERD (gastroesophageal reflux disease)    Glaucoma    Gout    Headache    Heart murmur    History of estrogen therapy    Hypercholesteremia    IBS (irritable bowel syndrome)    Mitral regurgitation    Pre-diabetes    Pulmonary air embolism (HCC)    states she had this wiht her back surgery   Rheumatoid arthritis (HCC)    Seborrhea    patient denies   Past Surgical History:  Procedure Laterality Date   ANTERIOR AND POSTERIOR REPAIR WITH SACROSPINOUS FIXATION N/A 01/08/2015   Procedure: ANTERIOR AND POSTERIOR REPAIR WITH possible SACROSPINOUS FIXATION;  Surgeon: Charlie Croak, MD;  Location: [facility];  Service: Gynecology;  Laterality: N/A;   BACK SURGERY     CATARACT EXTRACTION W/ INTRAOCULAR LENS  IMPLANT, BILATERAL Bilateral    COLONOSCOPY     EYE SURGERY     LAPAROSCOPIC ASSISTED VAGINAL HYSTERECTOMY N/A 01/08/2015   Procedure: LAPAROSCOPIC ASSISTED VAGINAL HYSTERECTOMY;  Surgeon: Charlie Croak, MD;  Location: [facility];  Service: Gynecology;  Laterality: N/A;   LUMBAR FUSION  05/2010   REVERSE SHOULDER ARTHROPLASTY Left 02/16/2014   Procedure: LEFT REVERSE  TOTAL SHOULDER ARTHROPLASTY;  Surgeon: Franky CHRISTELLA Pointer, MD;  Location: [facility];  Service: Orthopedics;  Laterality: Left;   REVERSE SHOULDER ARTHROPLASTY Right 08/07/2016   REVERSE SHOULDER ARTHROPLASTY Right 08/07/2016   Procedure: REVERSE RIGHT SHOULDER ARTHROPLASTY;  Surgeon: Franky Pointer, MD;  Location: [facility];  Service: Orthopedics;  Laterality: Right;   TOE SURGERY     TOTAL HIP ARTHROPLASTY Left 01/15/2024   Procedure: ARTHROPLASTY, HIP, TOTAL, ANTERIOR APPROACH;  Surgeon: Vernetta Lonni GRADE, MD;  Location: [facility];  Service: Orthopedics;  Laterality: Left;   TUBAL LIGATION     Social History   Socioeconomic History   Marital status: Widowed    Spouse name: Not on file   Number of children: 9   Years of education: 7   Highest education level: Not on file  Occupational History   Not on file  Tobacco Use   Smoking status: Never   Smokeless tobacco: Never  Vaping Use   Vaping status: Never Used  Substance and Sexual Activity   Alcohol use: No   Drug use: No   Sexual activity: Never  Other Topics Concern   Not on file  Social History Narrative   Lives with daughter   Caffeine use: none      Social history: She is retired.  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  Does  not smoke or consume alcohol.  She is a widow.       Family history: Mother died of an MI with history of hypertension who was 85 years of age at the time of her event.  1 brother with history of adult onset diabetes, 5 sisters-one of them has diabetes.  1 daughter died of an occult GI malignancy.  1 brother in good health.  She has 9 children.  1 daughter with history of pulmonary sarcoidosis years ago that resolved.   Social Drivers of Corporate investment banker Strain: Low Risk  (03/03/2023)   Overall Financial Resource Strain (CARDIA)    Difficulty of Paying Living Expenses: Not hard at all  Food Insecurity: No Food Insecurity (01/17/2024)    Hunger Vital Sign    Worried About Running Out of Food in the Last Year: Never true    Ran Out of Food in the Last Year: Never true  Transportation Needs: No Transportation Needs (17-Jan-2024)   PRAPARE - Administrator, Civil Service (Medical): No    Lack of Transportation (Non-Medical): No  Physical Activity: Insufficiently Active (03/03/2023)   Exercise Vital Sign    Days of Exercise per Week: 7 days    Minutes of Exercise per Session: 10 min  Stress: No Stress Concern Present (03/03/2023)   [employer] of Occupational Health - Occupational Stress Questionnaire    Feeling of Stress : Not at all  Social Connections: Moderately Integrated (Jan 17, 2024)   Social Connection and Isolation Panel    Frequency of Communication with Friends and Family: More than three times a week    Frequency of Social Gatherings with Friends and Family: More than three times a week    Attends Religious Services: More than 4 times per year    Active Member of [employer] or Organizations: Yes    Attends Banker Meetings: More than 4 times per year    Marital Status: Widowed  Intimate Partner Violence: Not At Risk (17-Jan-2024)   Humiliation, Afraid, Rape, and Kick questionnaire    Fear of Current or Ex-Partner: No    Emotionally Abused: No    Physically Abused: No    Sexually Abused: No   Allergies  Allergen Reactions   Alendronate    Allopurinol Swelling   Amoxicillin-Pot Clavulanate Diarrhea and Itching    PATIENT DOES NOT RECALL ALLERGIC REACTION TO AUGMENTIN.  Has patient had a PCN reaction causing immediate rash, facial/tongue/throat swelling, SOB or lightheadedness with hypotension: UNKNOWN Has patient had a PCN reaction causing severe rash involving mucus membranes or skin necrosis: UNKNOWN Has patient had a PCN reaction that required hospitalization No Has patient had a PCN reaction occurring within the last 10 years: No If all of the above answers are NO, then may proceed     Folic Acid     Methotrexate Derivatives    Latex Itching    Latex gloves    Tramadol  Nausea And Vomiting    Last set of Vital Signs (not current) Vitals:   Jan 17, 2024 1210 January 17, 2024 1306  BP: 107/77 (!) 127/50  Pulse: 63 (!) 55  Resp:    Temp:  (!) 97.5 F (36.4 C)  SpO2:  100%      Physical Exam Gen: unresponsive Cardiovascular: pulseless  Resp: apneic. Breath sounds equal bilaterally with bagging  Abd: nondistended  Neuro: GCS 3, unresponsive to pain, pupils 4mm b/l HEENT: No blood in posterior pharynx, gag reflex absent  Neck: No crepitus  Musculoskeletal: No deformity,  surgical dressing in place right hip Skin: warm   Procedure Name: Intubation Date/Time: 01/04/2024 3:23 PM  Performed by: Elnor Jayson LABOR, DOPre-anesthesia Checklist: Patient identified, Patient being monitored, Emergency Drugs available, Timeout performed and Suction available Oxygen Delivery Method: Ambu bag Preoxygenation: Pre-oxygenation with 100% oxygen Ventilation: Mask ventilation without difficulty Laryngoscope Size: Glidescope and 3 Tube size: 7.5 mm Number of attempts: 1 Placement Confirmation: ETT inserted through vocal cords under direct vision, CO2 detector and Breath sounds checked- equal and bilateral Secured at: 23 cm Tube secured with: ETT holder Dental Injury: Teeth and Oropharynx as per pre-operative assessment  Difficulty Due To: Difficulty was unanticipated     Cardiopulmonary Resuscitation (CPR) Procedure Note Directed/Performed by: Jayson LABOR Elnor I personally directed ancillary staff and/or performed CPR in an effort to regain return of spontaneous circulation and to maintain cardiac, neuro and systemic perfusion.   Medical Decision making  Patient had surgical procedure this morning, TNK was given early on in resuscitation  After approximately 35 minutes ACS protocol was performed patient is not demonstrating any signs of life, pupils are fixed and dilated, no carotid or  femoral pulse palpable.  No spontaneous respirations, no auscultated heart sounds.  No spontaneous movement to noxious stimulus.  Asystole noted on ZOLL.,  Time of death at 1640.  Patient's family and primary team at bedside following cessation of effort  Assessment and Plan  Cardiac arrest, patient expired  Spoke with patient's family and primary team at length at bedside following encounter

## 2024-01-25 NOTE — Op Note (Addendum)
 Operative Note  Date of operation: 01/20/2024 Preoperative diagnosis: Left hip avascular necrosis Postoperative diagnosis: Same  Procedure: Left direct anterior total hip arthroplasty  Implants: Implant Name Type Inv. Item Serial No. Manufacturer Lot No. LRB No. Used Action  PIN SECTOR W/GRIP ACE CUP - [id number] Hips PIN SECTOR W/GRIP ACE CUP  DEPUY ORTH[employer] [id number] Left 1 Implanted  SCREW 6.5MMX30MM - [id number] Screw SCREW 6.5MMX30MM  DEPUY ORTHOPAEDICS [id number] Left 2 Implanted  LINER NEUTRAL 52X36MM PLUS 4 - [id number] Liner LINER NEUTRAL 52X36MM PLUS 4  DEPUY ORTHOPAEDICS [id number] Left 1 Implanted  FEMORAL STEM 12/14 TPR SZ4 HIP - [id number] Orthopedic Implant FEMORAL STEM 12/14 TPR SZ4 HIP  DEPUY ORTHOPA[employer] [id number] Left 1 Implanted  HEAD ARTICULEZE - [id number] Hips HEAD ARTICULEZE  DEPUY ORTHOPAEDICS [id number] Left 1 Implanted   Surgeon: Lonni GRADE. 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  She does still ambulate with a walker but her hip femoral head is basically been melting away and this has been seen on serial radiographs of the left hip especially after a steroid injection in that left hip joint.  At this point her left hip pain is quite severe and again the femoral head shows significant collapse and deformity to where we have recommended a total hip arthroplasty and the patient and her family both agree with this.  We did discuss in length and detail the risks of acute blood loss anemia, nerve and vascular injury, DVT, infection, dislocation, implant failure, leg length differences and wound healing issues.  They understand that our goals are hopefully decreased pain, improved mobility, and improve quality of life.  Procedure description: After informed consent was obtained  and the appropriate left hip was marked, the patient was brought to the operating room and set up on the stretcher where spinal anesthesia was obtained.  The patient was then laid in a supine position on the stretcher and a Foley catheter was placed.  Next traction boots were placed on both her feet and she was placed supine on the Hana fracture table with a perineal post in place in both legs and inline skeletal traction devices but no traction applied.  Her left operative hip and pelvis were assessed radiographically.  The left hip was prepped and draped with DuraPrep and sterile drapes.  A timeout was called and she was identified as the correct patient and the correct left hip.  An incision was made just inferior and posterior to the ASIS and carried slightly obliquely down the leg.  Dissection was carried down to the tensor fascia lata muscle and the tensor fascia was then divided longitudinally to proceed with a direct interposed the hip.  Circumflex vessels were identified and cauterized.  The hip capsule was identified and opened up in L-type format finding a large joint effusion.  There was basically almost no femoral head left.  An oscillating saw was used to make a femoral neck cut distal to the femoral head but proximal to the lesser trochanter.  This was completed with an osteotome and a corkscrew guide was placed in the remnants of the femoral head and this was removed in pieces.  A bent Hohmann was then placed over the medial acetabular rim and remnants of the acetabular labrum and other debris removed.  Reaming was then initiated from a size 43 reamer and stepwise  increments going up to a size 51 reamer with all reamers placed under direct visualization and the last reamer also placed under direct fluoroscopy in order to obtain the depth of reaming, the inclination and the anteversion.  The real DePuy Sectra GRIPTION acetabular opponent size 52 was then placed without difficulty followed by 2  acetabular screws.  A 36+4 polythene liner was also placed.  Next attention was turned to the femur.  With the left leg externally rotated to 120 degrees, extended and adducted, a Mueller retractors placed medially and a Hohmann tractor behind the greater trochanter.  The lateral joint capsule was released and a box cutting osteotome was used into the femoral canal.  Broaching was then initiated using the Actis broaching system from a size 0 going up to a size 4.  We then trialed a high offset femoral neck combined with a 36+1.5 trial head ball.  The left leg was brought over and up and with traction and internal rotation reduced in the pelvis and based on radiographic and clinical assessment we needed more leg length.  We dislocated the hip and removed the trial components.  We then placed the real Actis femoral component size 4 with high offset and went with the real 36+5 metal head ball.  Again this was reduced in the acetabulum and we are pleased with assessment of stability clinically as well as assessing the implant and positioning radiographically.  The soft tissue was then irrigated normal saline solution.  Remnants of the joint capsule were closed with interrupted #1 Ethibond suture followed by #1 Vicryl close the tensor fascia.  0 Vicryl was used to close deep tissue and 2-0 Vicryl was used to close subcutaneous tissue.  Before the skin was closed it was noted that one of the Ethibond needle tips had broken off into the soft tissue.  This was fortunately identified under fluoroscopy.  The tensor fascia was opened up again and we were able to easily identified the tip of the needle and removed the needle tip.  A layered closure was performed again following removal of the broken needle tip.  The skin was closed with staples.  An Aquacel dressing was applied.  The patient was taken off the Hana table and taken the recovery room.  Tory Gaskins, PA-C did assist during the entire case and beginning and his  assistance was crucial and medically necessary for soft tissue management and retraction, helping guide implant placement and a layered closure of the wound.

## 2024-01-25 NOTE — Progress Notes (Signed)
 Per primary nurse, Tu, [place] approved removal of ETT. Cuff deflated and removed without problems by this RN.

## 2024-01-25 DEATH — deceased

## 2024-02-02 ENCOUNTER — Other Ambulatory Visit: Payer: Self-pay | Admitting: Internal Medicine

## 2024-03-14 ENCOUNTER — Other Ambulatory Visit

## 2024-03-18 ENCOUNTER — Ambulatory Visit: Admitting: Internal Medicine
